# Patient Record
Sex: Female | Born: 1965 | Race: White | Hispanic: No | Marital: Single | Smoking: Former smoker
Health system: Southern US, Community
[De-identification: ages and names within clinical notes are randomized; demographics above are authoritative.]

## PROBLEM LIST (undated history)

## (undated) DIAGNOSIS — N200 Calculus of kidney: Secondary | ICD-10-CM

## (undated) DIAGNOSIS — K5790 Diverticulosis of intestine, part unspecified, without perforation or abscess without bleeding: Secondary | ICD-10-CM

## (undated) DIAGNOSIS — M51369 Other intervertebral disc degeneration, lumbar region without mention of lumbar back pain or lower extremity pain: Secondary | ICD-10-CM

## (undated) DIAGNOSIS — K828 Other specified diseases of gallbladder: Secondary | ICD-10-CM

## (undated) DIAGNOSIS — R51 Headache: Secondary | ICD-10-CM

## (undated) DIAGNOSIS — M5126 Other intervertebral disc displacement, lumbar region: Secondary | ICD-10-CM

## (undated) DIAGNOSIS — N390 Urinary tract infection, site not specified: Secondary | ICD-10-CM

## (undated) DIAGNOSIS — R519 Headache, unspecified: Secondary | ICD-10-CM

## (undated) DIAGNOSIS — K219 Gastro-esophageal reflux disease without esophagitis: Secondary | ICD-10-CM

## (undated) DIAGNOSIS — J189 Pneumonia, unspecified organism: Secondary | ICD-10-CM

## (undated) DIAGNOSIS — Q249 Congenital malformation of heart, unspecified: Secondary | ICD-10-CM

## (undated) DIAGNOSIS — K900 Celiac disease: Secondary | ICD-10-CM

## (undated) DIAGNOSIS — I209 Angina pectoris, unspecified: Secondary | ICD-10-CM

## (undated) DIAGNOSIS — F32A Depression, unspecified: Secondary | ICD-10-CM

## (undated) DIAGNOSIS — F419 Anxiety disorder, unspecified: Secondary | ICD-10-CM

## (undated) DIAGNOSIS — G43909 Migraine, unspecified, not intractable, without status migrainosus: Secondary | ICD-10-CM

## (undated) DIAGNOSIS — E669 Obesity, unspecified: Secondary | ICD-10-CM

## (undated) DIAGNOSIS — I519 Heart disease, unspecified: Secondary | ICD-10-CM

## (undated) DIAGNOSIS — I493 Ventricular premature depolarization: Secondary | ICD-10-CM

## (undated) DIAGNOSIS — E119 Type 2 diabetes mellitus without complications: Secondary | ICD-10-CM

## (undated) DIAGNOSIS — K5730 Diverticulosis of large intestine without perforation or abscess without bleeding: Secondary | ICD-10-CM

## (undated) DIAGNOSIS — I10 Essential (primary) hypertension: Secondary | ICD-10-CM

## (undated) DIAGNOSIS — E039 Hypothyroidism, unspecified: Secondary | ICD-10-CM

## (undated) DIAGNOSIS — E079 Disorder of thyroid, unspecified: Secondary | ICD-10-CM

## (undated) DIAGNOSIS — M5136 Other intervertebral disc degeneration, lumbar region: Secondary | ICD-10-CM

## (undated) DIAGNOSIS — F329 Major depressive disorder, single episode, unspecified: Secondary | ICD-10-CM

## (undated) DIAGNOSIS — K760 Fatty (change of) liver, not elsewhere classified: Secondary | ICD-10-CM

## (undated) DIAGNOSIS — Z87442 Personal history of urinary calculi: Secondary | ICD-10-CM

## (undated) HISTORY — DX: Gastro-esophageal reflux disease without esophagitis: K21.9

## (undated) HISTORY — PX: KIDNEY STONE SURGERY: SHX686

## (undated) HISTORY — DX: Anxiety disorder, unspecified: F41.9

## (undated) HISTORY — DX: Urinary tract infection, site not specified: N39.0

## (undated) HISTORY — PX: TUBAL LIGATION: SHX77

## (undated) HISTORY — DX: Migraine, unspecified, not intractable, without status migrainosus: G43.909

## (undated) HISTORY — DX: Headache, unspecified: R51.9

## (undated) HISTORY — PX: NASAL SINUS SURGERY: SHX719

## (undated) HISTORY — PX: LITHOTRIPSY: SUR834

## (undated) HISTORY — DX: Fatty (change of) liver, not elsewhere classified: K76.0

## (undated) HISTORY — DX: Congenital malformation of heart, unspecified: Q24.9

## (undated) HISTORY — DX: Major depressive disorder, single episode, unspecified: F32.9

## (undated) HISTORY — DX: Obesity, unspecified: E66.9

## (undated) HISTORY — DX: Disorder of thyroid, unspecified: E07.9

## (undated) HISTORY — DX: Diverticulosis of intestine, part unspecified, without perforation or abscess without bleeding: K57.90

## (undated) HISTORY — DX: Pneumonia, unspecified organism: J18.9

## (undated) HISTORY — DX: Headache: R51

## (undated) HISTORY — DX: Depression, unspecified: F32.A

---

## 1984-03-09 HISTORY — PX: ABDOMINAL EXPLORATION SURGERY: SHX538

## 1998-07-09 ENCOUNTER — Emergency Department (HOSPITAL_COMMUNITY): Admission: EM | Admit: 1998-07-09 | Discharge: 1998-07-09 | Payer: Self-pay | Admitting: Emergency Medicine

## 1998-07-09 ENCOUNTER — Encounter: Payer: Self-pay | Admitting: Emergency Medicine

## 2000-12-20 ENCOUNTER — Encounter: Payer: Self-pay | Admitting: Emergency Medicine

## 2000-12-20 ENCOUNTER — Emergency Department (HOSPITAL_COMMUNITY): Admission: EM | Admit: 2000-12-20 | Discharge: 2000-12-20 | Payer: Self-pay | Admitting: Emergency Medicine

## 2000-12-20 ENCOUNTER — Encounter: Payer: Self-pay | Admitting: *Deleted

## 2004-07-25 ENCOUNTER — Ambulatory Visit: Payer: Self-pay | Admitting: Urology

## 2004-12-28 ENCOUNTER — Emergency Department: Payer: Self-pay | Admitting: Emergency Medicine

## 2005-02-09 ENCOUNTER — Ambulatory Visit: Payer: Self-pay

## 2005-02-13 ENCOUNTER — Ambulatory Visit: Payer: Self-pay

## 2005-04-11 ENCOUNTER — Other Ambulatory Visit: Payer: Self-pay

## 2005-04-11 ENCOUNTER — Emergency Department: Payer: Self-pay | Admitting: Emergency Medicine

## 2007-02-05 ENCOUNTER — Emergency Department (HOSPITAL_COMMUNITY): Admission: EM | Admit: 2007-02-05 | Discharge: 2007-02-06 | Payer: Self-pay | Admitting: Emergency Medicine

## 2007-05-25 ENCOUNTER — Emergency Department (HOSPITAL_COMMUNITY): Admission: EM | Admit: 2007-05-25 | Discharge: 2007-05-26 | Payer: Self-pay | Admitting: Emergency Medicine

## 2010-04-09 ENCOUNTER — Emergency Department (HOSPITAL_COMMUNITY)
Admission: EM | Admit: 2010-04-09 | Discharge: 2010-04-09 | Disposition: A | Discharge status: Home / Self Care | Payer: Self-pay | Attending: Emergency Medicine | Admitting: Emergency Medicine

## 2010-04-09 DIAGNOSIS — E669 Obesity, unspecified: Secondary | ICD-10-CM | POA: Insufficient documentation

## 2010-04-09 DIAGNOSIS — F3289 Other specified depressive episodes: Secondary | ICD-10-CM | POA: Insufficient documentation

## 2010-04-09 DIAGNOSIS — F329 Major depressive disorder, single episode, unspecified: Secondary | ICD-10-CM | POA: Insufficient documentation

## 2010-04-09 DIAGNOSIS — K219 Gastro-esophageal reflux disease without esophagitis: Secondary | ICD-10-CM | POA: Insufficient documentation

## 2010-04-09 DIAGNOSIS — R232 Flushing: Secondary | ICD-10-CM | POA: Insufficient documentation

## 2010-04-09 DIAGNOSIS — F411 Generalized anxiety disorder: Secondary | ICD-10-CM | POA: Insufficient documentation

## 2010-07-24 ENCOUNTER — Emergency Department (HOSPITAL_COMMUNITY)
Admission: EM | Admit: 2010-07-24 | Discharge: 2010-07-25 | Disposition: A | Discharge status: Other Acute Inpatient Hospital | Payer: Self-pay | Source: Home / Self Care | Attending: Emergency Medicine | Admitting: Emergency Medicine

## 2010-07-24 ENCOUNTER — Emergency Department (HOSPITAL_COMMUNITY): Payer: Self-pay

## 2010-07-24 DIAGNOSIS — R9431 Abnormal electrocardiogram [ECG] [EKG]: Secondary | ICD-10-CM | POA: Insufficient documentation

## 2010-07-24 DIAGNOSIS — R5383 Other fatigue: Secondary | ICD-10-CM | POA: Insufficient documentation

## 2010-07-24 DIAGNOSIS — R002 Palpitations: Secondary | ICD-10-CM | POA: Insufficient documentation

## 2010-07-24 DIAGNOSIS — F411 Generalized anxiety disorder: Secondary | ICD-10-CM | POA: Insufficient documentation

## 2010-07-24 DIAGNOSIS — R079 Chest pain, unspecified: Secondary | ICD-10-CM | POA: Insufficient documentation

## 2010-07-24 DIAGNOSIS — R5381 Other malaise: Secondary | ICD-10-CM | POA: Insufficient documentation

## 2010-07-24 LAB — BASIC METABOLIC PANEL
BUN: 12 mg/dL (ref 6–23)
Calcium: 8.5 mg/dL (ref 8.4–10.5)
GFR calc Af Amer: >60 mL/min (ref >60)
Glucose, Bld: 119 mg/dL — ABNORMAL HIGH (ref 70–99)
Potassium: 3.9 mEq/L (ref 3.5–5.1)
Sodium: 136 mEq/L (ref 135–145)

## 2010-07-24 LAB — CBC
Hemoglobin: 12.2 g/dL (ref 12.0–15.0)
MCH: 27.3 pg (ref 26.0–34.0)
MCHC: 33.2 g/dL (ref 30.0–36.0)
MCV: 82.1 fL (ref 78.0–100.0)
RBC: 4.47 MIL/uL (ref 3.87–5.11)
RDW: 13.9 % (ref 11.5–15.5)

## 2010-07-24 LAB — PROTIME-INR: Prothrombin Time: 13.1 seconds (ref 11.6–15.2)

## 2010-07-24 LAB — POCT CARDIAC MARKERS
CKMB, poc: <1 ng/mL — ABNORMAL LOW (ref 1.0–8.0)
CKMB, poc: <1 ng/mL — ABNORMAL LOW (ref 1.0–8.0)
Troponin i, poc: <0.05 ng/mL (ref 0.00–0.09)

## 2010-07-24 LAB — APTT: aPTT: 30 seconds (ref 24–37)

## 2010-07-25 ENCOUNTER — Observation Stay (HOSPITAL_COMMUNITY)
Admission: AD | Admit: 2010-07-25 | Discharge: 2010-07-26 | Disposition: A | Discharge status: Home / Self Care | Payer: Self-pay | Source: Other Acute Inpatient Hospital | Attending: Internal Medicine | Admitting: Internal Medicine

## 2010-07-25 DIAGNOSIS — F3289 Other specified depressive episodes: Secondary | ICD-10-CM | POA: Insufficient documentation

## 2010-07-25 DIAGNOSIS — K219 Gastro-esophageal reflux disease without esophagitis: Secondary | ICD-10-CM | POA: Insufficient documentation

## 2010-07-25 DIAGNOSIS — Z01812 Encounter for preprocedural laboratory examination: Secondary | ICD-10-CM | POA: Insufficient documentation

## 2010-07-25 DIAGNOSIS — Z01810 Encounter for preprocedural cardiovascular examination: Secondary | ICD-10-CM | POA: Insufficient documentation

## 2010-07-25 DIAGNOSIS — R079 Chest pain, unspecified: Principal | ICD-10-CM | POA: Insufficient documentation

## 2010-07-25 DIAGNOSIS — Z01818 Encounter for other preprocedural examination: Secondary | ICD-10-CM | POA: Insufficient documentation

## 2010-07-25 DIAGNOSIS — F329 Major depressive disorder, single episode, unspecified: Secondary | ICD-10-CM | POA: Insufficient documentation

## 2010-07-25 DIAGNOSIS — F411 Generalized anxiety disorder: Secondary | ICD-10-CM | POA: Insufficient documentation

## 2010-07-25 LAB — LIPID PANEL
LDL Cholesterol: 114 mg/dL — ABNORMAL HIGH (ref 0–99)
Triglycerides: 148 mg/dL (ref <150)
VLDL: 30 mg/dL (ref 0–40)

## 2010-07-25 LAB — CBC
HCT: 33.3 % — ABNORMAL LOW (ref 36.0–46.0)
Hemoglobin: 11 g/dL — ABNORMAL LOW (ref 12.0–15.0)
MCHC: 33 g/dL (ref 30.0–36.0)
MCV: 82.4 fL (ref 78.0–100.0)
RDW: 14 % (ref 11.5–15.5)
WBC: 7.8 10*3/uL (ref 4.0–10.5)

## 2010-07-25 LAB — CARDIAC PANEL(CRET KIN+CKTOT+MB+TROPI)
CK, MB: 1.4 ng/mL (ref 0.3–4.0)
Total CK: 68 U/L (ref 7–177)
Troponin I: <0.3 ng/mL (ref <0.30)

## 2010-07-25 LAB — HEMOGLOBIN A1C
Hgb A1c MFr Bld: 5.5 % (ref <5.7)
Mean Plasma Glucose: 111 mg/dL (ref <117)

## 2010-07-25 LAB — MAGNESIUM: Magnesium: 2 mg/dL (ref 1.5–2.5)

## 2010-07-25 LAB — D-DIMER, QUANTITATIVE: D-Dimer, Quant: <0.22 ug/mL-FEU (ref 0.00–0.48)

## 2010-07-25 LAB — TSH: TSH: 5.411 u[IU]/mL — ABNORMAL HIGH (ref 0.350–4.500)

## 2010-07-25 LAB — HEPARIN LEVEL (UNFRACTIONATED): Heparin Unfractionated: 0.7 IU/mL (ref 0.30–0.70)

## 2010-07-26 LAB — CBC
RBC: 4.22 MIL/uL (ref 3.87–5.11)
WBC: 7.2 10*3/uL (ref 4.0–10.5)

## 2010-07-26 LAB — HEPARIN LEVEL (UNFRACTIONATED): Heparin Unfractionated: <0.1 IU/mL — ABNORMAL LOW (ref 0.30–0.70)

## 2010-07-26 LAB — BASIC METABOLIC PANEL
CO2: 27 mEq/L (ref 19–32)
Chloride: 104 mEq/L (ref 96–112)
GFR calc non Af Amer: >60 mL/min (ref >60)

## 2010-07-30 NOTE — Discharge Summary (Signed)
Abigail [name], Abigail [name]             ACCOUNT NO.:  [id number]  MEDICAL RECORD NO.:  [id number]           PATIENT TYPE:  O  LOCATION:  0086                         FACILITY:  [facility]  PHYSICIAN:  Mali Biff Rutigliano, MD         DATE OF BIRTH:  January 28, 1966  DATE OF ADMISSION:  07/25/2010 DATE OF DISCHARGE:  07/26/2010                              DISCHARGE SUMMARY   DISCHARGE DIAGNOSES: 1. Chest pain.     a.     Negative myocardial infarction.     b.     Negative D-dimer.     c.     Patent coronary arteries.     d.     Secondary to negative myocardial infarction. 2. Gastroesophageal reflux disease. 3. Anxiety and depression. 4. Tachycardia.  DISCHARGE CONDITION:  Improved.  PROCEDURE:  Combined left heart cath by Dr. Glenetta Hew on Jul 25, 2010.  DISCHARGE MEDICATIONS:  See medication reconciliation sheet from [facility]. We added Toprol XL 12.5 mg daily, Lopressor 25 b.i.d., her blood pressure dropped into the 90, so we had to lower the dose. We also increased her Prilosec to twice a day.  DISCHARGE INSTRUCTIONS: 1. Increase activity slowly.  No work until Jul 30, 2010.  No lifting     for 4 days.  No driving for 2 days. 2. Wash cath site with soap and water.  Call if any bleeding, swelling     or drainage. 3. Low-sodium heart-healthy diet. 4. Follow up with Dr. Allison Quarry office, will call with date and time. 5. You will be scheduled to wear a heart monitor, the office will call     concerning this.  HOSPITAL COURSE:  A 45 year old female presented to emergency room at [name] with chest discomfort on Jul 24, 2010.  She was getting drowsy, felt anxious and nervous, felt like her pulse was irregular, and she had sudden onset left shoulder pain radiating down to her elbow across the chest into her neck, who contacted her primary care physician who instructed to go the emergency room.  She was having PVCs.  Initial thought was for discharge but then she continued with the  discomfort since she was admitted.  She had some EKG changes with T-wave inversions in V4 through V6.  She was admitted on IV nitro, IV heparin, and even with this and some morphine she would have some mild chest and shoulder pain.  Later that morning she was seen for cardiac cath, was found to have patent coronary arteries.  EF was 70%, hyperdynamic LV function. She did well overnight.  She continued with some PVCs.  By the next morning, hemoglobin 11.5, hematocrit 35.4, platelets 250.  WBC 7.2, sodium 138, potassium 4.3, BUN 8, creatinine 0.59, glucose 93.  PHYSICAL EXAMINATION:  VITAL SIGNS:  Blood pressure was ranging 90-97, pulse 67, respiratory rate 18, temperature 98.2, SpO2 98% on room air. HEART:  Regular rate and rhythm. LUNGS:  Clear. ABDOMEN:  Soft.  Right radial cath site was stable.  Dr. Gwenlyn Found, saw her and felt she was stable for discharge.  We will get an outpatient monitor to rule out  PSVT because of some of her symptoms. She will follow up with Dr. Ellyn Hack as.  Please note her glycohemoglobin was 5.5.  Her D-dimer was less than 0.22.  TSH was 5.4, magnesium was 2.0, and cardiac markers were all negative.  Initial TSH was 3.457, unsure why the discrepancy.  RADIOLOGY:  Chest x-ray, negative 2-view chest.     Abigail [name]. Abigail [name], N.P.   ______________________________ Mali Tauno Falotico, MD    LRI/MEDQ  D:  07/26/2010  T:  07/26/2010  Job:  [id number]  cc:   Rolland Porter, MD  Electronically Signed by Cecilie Kicks N.P. on 07/29/2010 06:41:04 PM Electronically Signed by Raliegh Ip. Ahad Colarusso M.D. on 07/30/2010 08:12:39 AM

## 2010-08-02 NOTE — Cardiovascular Report (Signed)
Abigail [name], Abigail [name]             ACCOUNT NO.:  [id number]  MEDICAL RECORD NO.:  [id number]           PATIENT TYPE:  O  LOCATION:  0272                         FACILITY:  [place]  PHYSICIAN:  Rolland Porter, MD DATE OF BIRTH:  19-Sep-1965  DATE OF PROCEDURE:  07/25/2010 DATE OF DISCHARGE:  07/26/2010                           CARDIAC CATHETERIZATION   PRIMARY PHYSICIAN:  Dr. Eual Fines From [facility] in [place], [place].  PROCEDURES PERFORMED: 1. Left heart catheterization via 5-French right radial access. 2. Left ventriculogram, 10 mL contrast for 30 mL. 3. Native coronary angiography.  INDICATIONS:  Persistent chest pain despite nitroglycerin concerning for unstable angina.  BRIEF HISTORY:  [name] is a very pleasant is 45 year old woman who was admitted overnight on the 17th into the 18th with progressive chest pain intermittently with nitroglycerin.  Based on the requiring dose of nitroglycerin, decision was made and wished to proceed with diagnostic cardiac angiography to investigate coronary anatomy for possible unstable angina.  PROCEDURE:  The risks, benefits, alternatives, and indication of the procedure were explained to the patient in detail.  She voiced understanding with signed form placed on the chart.  The patient was brought to the second floor at [facility] in the fasting state.  She was prepped and draped in usual sterile fashion with the right radial artery access site isolated. Prior to draping a modified Allen's/Barbeau test was performed.  It demonstrated adequate collateral circulation through the right ulnar artery.  A time-out period was performed and the patient was sedated with intravenous Versed and fentanyl.  The right wrist was then anesthetized using 1% subcutaneous lidocaine and the right radial artery was then accessed using the Seldinger technique with placement of 5- French sheath.   After sheath was placed.  Intravenous heparin bolus of 3500 units was administered as well as 10 mL of standard radial cocktail being as described below was infused through the radial sheath.  Then, a 5-French TIG-4 catheter was advanced over J-wire into the ascending aorta.  It was used to engage the left coronary artery and multiple angiographies of left coronary system obtained.  Catheter was exchanged for JR-4 catheter as it was unable to engage the right coronary artery but due to an anterior downward takeoff.  It was unsuccessfully the JR-4 catheter therefore was exchanged for 5-French RCB catheter, which demonstrated multiple angiographies of the right coronary system were obtained.  This was exchanged for a 5-French angled pigtail catheter was advanced across the aortic valve measuring left ventricular hemodynamics.  The radial sheath was then removed and a TR band placed at 13 mL of air 3:40 in the afternoon.  Left ventriculogram was then performed.  The hemodynamics was then remeasured in the catheters pullback across the aortic valve measuring pullback gradient.  The patient is stable for during and after the procedure.  There were no complications.  Estimated blood loss less than 10 mL.  CATHETERIZATION STATISTICS: 1. Sedation, 1 mg of Versed and 25 mcg of fentanyl.  Also 5 mg p.o.     Valium was given for premedication along with 25 mg of  Benadryl. 2. Radial cocktail consists of dilated 10 mL saline, 400 mcg of     nitroglycerin, 5 mg of verapamil, and 2 mL of 1% lidocaine. 3. Contrast was less than 70 mL.  ANGIOGRAPHIC HEMODYNAMICS: 1. Left ventricular pressure 119/30 mmHg with EDP of 19 mmHg. 2. Aortic pressures 113/70 mmHg with EDP or the mean of 87 mmHg. 3. Left ventriculogram showed a hyperdynamic left ventricle with EF of     70%.  ANGIOGRAPHIC FINDINGS:  Overall, there is minimal angiographic evidence of coronary artery disease with extremely tortuous  vessels.  1. The left main was very, very short, essentially cloacal left main,     which gives rise to the LAD and circumflex.  There is no     significant disease 2. The LAD is a large-caliber to tortuous vessel, which gives rise to     a large diagonal branches into small branches.  There is no     significant disease noted. 3. The circumflex is also large caliber tortuous vessel gives rise to     some distal large obtuse marginals as well as one mid obtuse     marginal branch.  There is no significant disease. 4. Right coronary artery is dominant, gives rise to a posterolateral     and posterior descending arteries with no significant  disease.  IMPRESSION: 1. Large, tortuous coronary arteries with no angiographic evidence,     significant coronary disease. 2. Most likely diagnosis of chest pain as nonanginal in chest pain.     All the coronary spasm maybe potential.  PLAN:  Standard postradial care, he will continue to wean off of nitroglycerin and consider adding a calcium channel blocker either on discharge or upon followup.  The patient will follow up with me in [facility] for additional evaluation and care.          ______________________________ Rolland Porter, MD     DWH/MEDQ  D:  07/28/2010  T:  07/29/2010  Job:  [id number]  cc:   Cyndi Bender, PA [facility]  Electronically Signed by Glenetta Hew MD on 08/02/2010 12:05:10 PM

## 2010-12-01 LAB — DIFFERENTIAL
Lymphocytes Relative: 18
Lymphs Abs: 1.9
Neutro Abs: 7.6
Neutrophils Relative %: 73

## 2010-12-01 LAB — I-STAT 8, (EC8 V) (CONVERTED LAB)
Bicarbonate: 23.1
Glucose, Bld: 109 — ABNORMAL HIGH
TCO2: 24
pH, Ven: 7.458 — ABNORMAL HIGH

## 2010-12-01 LAB — POCT I-STAT CREATININE: Operator id: 270651

## 2010-12-01 LAB — CBC
HCT: 39.9
Hemoglobin: 13.7
MCHC: 34.4
MCV: 87.1
Platelets: 293
RBC: 4.59
RDW: 13.6
WBC: 10.3

## 2010-12-01 LAB — POCT CARDIAC MARKERS
CKMB, poc: <1 — ABNORMAL LOW
Myoglobin, poc: 37.4
Operator id: 270651
Troponin i, poc: <0.05
Troponin i, poc: <0.05

## 2010-12-01 LAB — D-DIMER, QUANTITATIVE: D-Dimer, Quant: 0.36

## 2012-04-30 ENCOUNTER — Emergency Department: Payer: Self-pay | Admitting: Emergency Medicine

## 2012-12-16 ENCOUNTER — Emergency Department (HOSPITAL_COMMUNITY)
Admission: EM | Admit: 2012-12-16 | Discharge: 2012-12-16 | Disposition: A | Discharge status: Home / Self Care | Payer: 59 | Attending: Emergency Medicine | Admitting: Emergency Medicine

## 2012-12-16 ENCOUNTER — Telehealth: Payer: Self-pay | Admitting: Cardiology

## 2012-12-16 ENCOUNTER — Emergency Department (HOSPITAL_COMMUNITY): Payer: 59

## 2012-12-16 ENCOUNTER — Encounter (HOSPITAL_COMMUNITY): Payer: Self-pay | Admitting: Emergency Medicine

## 2012-12-16 DIAGNOSIS — R072 Precordial pain: Secondary | ICD-10-CM | POA: Insufficient documentation

## 2012-12-16 DIAGNOSIS — R079 Chest pain, unspecified: Secondary | ICD-10-CM

## 2012-12-16 DIAGNOSIS — R0602 Shortness of breath: Secondary | ICD-10-CM | POA: Insufficient documentation

## 2012-12-16 DIAGNOSIS — Z79899 Other long term (current) drug therapy: Secondary | ICD-10-CM | POA: Insufficient documentation

## 2012-12-16 DIAGNOSIS — I10 Essential (primary) hypertension: Secondary | ICD-10-CM | POA: Insufficient documentation

## 2012-12-16 HISTORY — DX: Heart disease, unspecified: I51.9

## 2012-12-16 LAB — POCT I-STAT TROPONIN I
Troponin i, poc: 0 ng/mL (ref 0.00–0.08)
Troponin i, poc: 0 ng/mL (ref 0.00–0.08)

## 2012-12-16 LAB — BASIC METABOLIC PANEL
CO2: 28 mEq/L (ref 19–32)
GFR calc non Af Amer: >90 mL/min (ref >90)
Glucose, Bld: 112 mg/dL — ABNORMAL HIGH (ref 70–99)
Potassium: 3.5 mEq/L (ref 3.5–5.1)
Sodium: 141 mEq/L (ref 135–145)

## 2012-12-16 LAB — CBC
Hemoglobin: 12.3 g/dL (ref 12.0–15.0)
MCHC: 32.1 g/dL (ref 30.0–36.0)
Platelets: 293 10*3/uL (ref 150–400)
RBC: 4.75 MIL/uL (ref 3.87–5.11)

## 2012-12-16 LAB — D-DIMER, QUANTITATIVE: D-Dimer, Quant: <0.27 ug/mL-FEU (ref 0.00–0.48)

## 2012-12-16 MED ORDER — ASPIRIN 325 MG PO TABS: 325.0000 mg | ORAL_TABLET | Freq: Once | ORAL | Status: DC

## 2012-12-16 MED ORDER — ALPRAZOLAM 0.25 MG PO TABS
0.2500 mg | ORAL_TABLET | Freq: Once | ORAL | Status: AC
  Administered 2012-12-16: 0.25 mg via ORAL
  Filled 2012-12-16: qty 1

## 2012-12-16 MED ORDER — ASPIRIN 81 MG PO CHEW
162.0000 mg | CHEWABLE_TABLET | Freq: Once | ORAL | Status: AC
  Administered 2012-12-16: 162 mg via ORAL
  Filled 2012-12-16: qty 2

## 2012-12-16 MED ORDER — TRAMADOL HCL 50 MG PO TABS
50.0000 mg | ORAL_TABLET | Freq: Once | ORAL | Status: AC
  Administered 2012-12-16: 50 mg via ORAL
  Filled 2012-12-16: qty 1

## 2012-12-16 NOTE — ED Provider Notes (Signed)
CSN: [id number]     Arrival date & time 12/16/12  1446 History   First MD Initiated Contact with Patient 12/16/12 1507     Chief Complaint  Patient presents with  . Chest Pain   (Consider location/radiation/quality/duration/timing/severity/associated sxs/prior Treatment) The history is provided by the patient.  Abigail [name] is a 47 y.o. female hx of HTN here with chest pain. L arm pain for the last week. However, this AM she woke up then had substernal chest pain radiated to L arm and jaw area. She then felt short of breath and felt that "she was choking". She called her cardiologist and was sent for evaluation. She had similar episode in 2012 and she had cardiac cath that was normal. No history of DVT/PE.    Past Medical History  Diagnosis Date  . Heart disease    History reviewed. No pertinent past surgical history. History reviewed. No pertinent family history. History  Substance Use Topics  . Smoking status: Never Smoker   . Smokeless tobacco: Not on file  . Alcohol Use: No   OB History   Grav Para Term Preterm Abortions TAB SAB Ect Mult Living                 Review of Systems  Respiratory: Positive for shortness of breath.   Cardiovascular: Positive for chest pain.  All other systems reviewed and are negative.    Allergies  Review of patient's allergies indicates no known allergies.  Home Medications   Current Outpatient Rx  Name  Route  Sig  Dispense  Refill  . aspirin 81 MG chewable tablet   Oral   Chew 81 mg by mouth daily as needed for pain.         . Aspirin-Caffeine (BAYER BACK & BODY PAIN EX ST PO)   Oral   Take 1 tablet by mouth every 6 (six) hours as needed (pain).         [name] esomeprazole (NEXIUM) 40 MG capsule   Oral   Take 40 mg by mouth daily before breakfast.         . ibuprofen (ADVIL,MOTRIN) 200 MG tablet   Oral   Take 400 mg by mouth every 6 (six) hours as needed for pain or headache.         . IRON PO   Oral   Take 1  tablet by mouth every other day.         . metoprolol tartrate (LOPRESSOR) 25 MG tablet   Oral   Take 12.5 mg by mouth daily.          BP 129/76  Pulse 103  Temp(Src) 99.2 F (37.3 C) (Oral)  Resp 13  SpO2 97%  LMP 12/09/2012 Physical Exam  Nursing note and vitals reviewed. Constitutional: She is oriented to person, place, and time. She appears well-developed and well-nourished.  HENT:  Head: Normocephalic.  Mouth/Throat: Oropharynx is clear and moist.  Eyes: Conjunctivae are normal. Pupils are equal, round, and reactive to light.  Neck: Normal range of motion. Neck supple.  Cardiovascular: Regular rhythm and normal heart sounds.   Slightly tachy   Pulmonary/Chest: Effort normal and breath sounds normal. No respiratory distress. She has no wheezes. She has no rales.  Abdominal: Soft. Bowel sounds are normal. She exhibits no distension. There is no tenderness. There is no rebound and no guarding.  Musculoskeletal: Normal range of motion. She exhibits no edema and no tenderness.  Neurological: She is alert and oriented  to person, place, and time.  Skin: Skin is warm and dry.  Psychiatric: She has a normal mood and affect. Her behavior is normal. Judgment and thought content normal.    ED Course  Procedures (including critical care time) Labs Review Labs Reviewed  CBC - Abnormal; Notable for the following:    MCH 25.9 (*)    All other components within normal limits  BASIC METABOLIC PANEL - Abnormal; Notable for the following:    Glucose, Bld 112 (*)    All other components within normal limits  D-DIMER, QUANTITATIVE  POCT I-STAT TROPONIN I  POCT I-STAT TROPONIN I   Imaging Review Dg Chest 2 View  12/16/2012   CLINICAL DATA:  Chest pain  EXAM: CHEST  2 VIEW  COMPARISON:  07/24/2010  FINDINGS: Normal heart size, mediastinal contours, and pulmonary vascularity.  Lungs clear.  Bones unremarkable.  No pneumothorax.  IMPRESSION: Normal exam.   Electronically Signed   By:  Lavonia Dana M.D.   On: 12/16/2012 16:06    EKG Interpretation   None       Date: 12/16/2012  Rate: 103  Rhythm: sinus tachycardia  QRS Axis: normal  Intervals: normal  ST/T Wave abnormalities: nonspecific ST changes  Conduction Disutrbances:none  Narrative Interpretation:   Old EKG Reviewed: none available    MDM  No diagnosis found. DEANDRE STANSEL is a 47 y.o. female here with SOB, chest pain. Atypical for ACS but given onset today will get trop x 2. She is tachy I think likely from anxiety. But will need to consider possible PE so will get d-dimer.   7:32 PM D-dimer normal. Trop neg x 2. Stable for d/c.    Wandra Arthurs, MD 12/16/12 [phone]

## 2012-12-16 NOTE — ED Notes (Signed)
Pt took 2x63m chewable ASA tyhis AM

## 2012-12-16 NOTE — Telephone Encounter (Signed)
Returning your call. °

## 2012-12-16 NOTE — ED Notes (Signed)
Pt reports L arm pain x 1 week. Then this am she began to have CP/tightness across chest into her back and L shoulder with nausea and "felt like i was choking." states pain has been constant. A&ox4, resp e/u

## 2012-12-16 NOTE — Telephone Encounter (Signed)
Patient states that she is having chestpain and it is radiating between her shoulder blades.  I told her to go to the ER, but she still wants a nurse to call her.

## 2012-12-16 NOTE — Telephone Encounter (Signed)
Calling c/o chest pain that radiates through to back off and on since yesterday.  States she has also been increasingly fatigued. Right now feels like her heart is pounding in her chest.  Insisted she either have her friend take her to the ER or call 911.  She did not go earlier because she was hoping we would tell her she just needed to rest. Assured me she will go directly to [facility] ER.

## 2013-05-20 ENCOUNTER — Emergency Department (HOSPITAL_COMMUNITY): Payer: 59

## 2013-05-20 ENCOUNTER — Encounter (HOSPITAL_COMMUNITY): Payer: Self-pay | Admitting: Emergency Medicine

## 2013-05-20 DIAGNOSIS — R42 Dizziness and giddiness: Secondary | ICD-10-CM | POA: Insufficient documentation

## 2013-05-20 DIAGNOSIS — I498 Other specified cardiac arrhythmias: Secondary | ICD-10-CM | POA: Insufficient documentation

## 2013-05-20 DIAGNOSIS — R55 Syncope and collapse: Secondary | ICD-10-CM | POA: Insufficient documentation

## 2013-05-20 DIAGNOSIS — Z8679 Personal history of other diseases of the circulatory system: Secondary | ICD-10-CM | POA: Insufficient documentation

## 2013-05-20 DIAGNOSIS — Z79899 Other long term (current) drug therapy: Secondary | ICD-10-CM | POA: Insufficient documentation

## 2013-05-20 LAB — CBC WITH DIFFERENTIAL/PLATELET
BASOS PCT: 0 % (ref 0–1)
Basophils Absolute: 0 10*3/uL (ref 0.0–0.1)
Eosinophils Absolute: 0.4 10*3/uL (ref 0.0–0.7)
Eosinophils Relative: 4 % (ref 0–5)
HEMATOCRIT: 40 % (ref 36.0–46.0)
Hemoglobin: 13.7 g/dL (ref 12.0–15.0)
LYMPHS ABS: 2.6 10*3/uL (ref 0.7–4.0)
Lymphocytes Relative: 29 % (ref 12–46)
MCH: 28.4 pg (ref 26.0–34.0)
MCHC: 34.3 g/dL (ref 30.0–36.0)
MCV: 83 fL (ref 78.0–100.0)
MONO ABS: 0.5 10*3/uL (ref 0.1–1.0)
MONOS PCT: 6 % (ref 3–12)
NEUTROS ABS: 5.6 10*3/uL (ref 1.7–7.7)
Neutrophils Relative %: 61 % (ref 43–77)
Platelets: 318 10*3/uL (ref 150–400)
RBC: 4.82 MIL/uL (ref 3.87–5.11)
RDW: 13.9 % (ref 11.5–15.5)
WBC: 9.1 10*3/uL (ref 4.0–10.5)

## 2013-05-20 LAB — CBG MONITORING, ED: Glucose-Capillary: 147 mg/dL — ABNORMAL HIGH (ref 70–99)

## 2013-05-20 LAB — I-STAT TROPONIN, ED: TROPONIN I, POC: 0 ng/mL (ref 0.00–0.08)

## 2013-05-20 NOTE — ED Notes (Addendum)
Pt reports that she has been out of BP meds for 2 weeks; reports she feels very fatigued. Took BP at home and was 180/110. Reports she feels like her throat is closing but no airway swelling or difficulty talking or breathing. No slurred speech, no extremity weakness; A&Ox3. Reports she just hasnt felt right all day. EKG performed in triage. Family reports LSN 1500 today. Pt tachy in 110's. Denies chest pain; SOB.

## 2013-05-21 ENCOUNTER — Emergency Department (HOSPITAL_COMMUNITY)
Admission: EM | Admit: 2013-05-21 | Discharge: 2013-05-21 | Disposition: A | Discharge status: Home / Self Care | Payer: 59 | Attending: Emergency Medicine | Admitting: Emergency Medicine

## 2013-05-21 DIAGNOSIS — R55 Syncope and collapse: Secondary | ICD-10-CM

## 2013-05-21 DIAGNOSIS — R000 Tachycardia, unspecified: Secondary | ICD-10-CM

## 2013-05-21 LAB — COMPREHENSIVE METABOLIC PANEL
ALK PHOS: 106 U/L (ref 39–117)
ALT: 31 U/L (ref 0–35)
AST: 23 U/L (ref 0–37)
Albumin: 3.6 g/dL (ref 3.5–5.2)
BUN: 11 mg/dL (ref 6–23)
CHLORIDE: 102 meq/L (ref 96–112)
CO2: 23 mEq/L (ref 19–32)
CREATININE: 0.59 mg/dL (ref 0.50–1.10)
Calcium: 9.1 mg/dL (ref 8.4–10.5)
GFR calc Af Amer: >90 mL/min (ref >90)
GLUCOSE: 134 mg/dL — AB (ref 70–99)
Potassium: 4 mEq/L (ref 3.7–5.3)
Sodium: 141 mEq/L (ref 137–147)
TOTAL PROTEIN: 7 g/dL (ref 6.0–8.3)
Total Bilirubin: 0.4 mg/dL (ref 0.3–1.2)

## 2013-05-21 MED ORDER — METOPROLOL SUCCINATE ER 25 MG PO TB24: 12.5000 mg | ORAL_TABLET | Freq: Every day | ORAL | Status: DC

## 2013-05-21 MED ORDER — METOPROLOL TARTRATE 25 MG PO TABS
12.5000 mg | ORAL_TABLET | Freq: Once | ORAL | Status: AC
  Administered 2013-05-21: 12.5 mg via ORAL
  Filled 2013-05-21: qty 1

## 2013-05-21 NOTE — ED Notes (Signed)
The pt has some sl chest tightness still.  bp lower

## 2013-05-21 NOTE — Discharge Instructions (Signed)
All the results in the ER are normal, labs and imaging. We are not sure what is causing your symptoms. We think it could be due to your elevated heart rate, or a vasovagal event. The workup in the ER is not complete, and is limited to screening for life threatening and emergent conditions only, so please see a primary care doctor for further evaluation.   Vasovagal Syncope, Adult Syncope, commonly known as fainting, is a temporary loss of consciousness. It occurs when the blood flow to the brain is reduced. Vasovagal syncope (also called neurocardiogenic syncope) is a fainting spell in which the blood flow to the brain is reduced because of a sudden drop in heart rate and blood pressure. Vasovagal syncope occurs when the brain and the cardiovascular system (blood vessels) do not adequately communicate and respond to each other. This is the most common cause of fainting. It often occurs in response to fear or some other type of emotional or physical stress. The body has a reaction in which the heart starts beating too slowly or the blood vessels expand, reducing blood pressure. This type of fainting spell is generally considered harmless. However, injuries can occur if a person takes a sudden fall during a fainting spell.  CAUSES  Vasovagal syncope occurs when a person's blood pressure and heart rate decrease suddenly, usually in response to a trigger. Many things and situations can trigger an episode. Some of these include:   Pain.   Fear.   The sight of blood or medical procedures, such as blood being drawn from a vein.   Common activities, such as coughing, swallowing, stretching, or going to the bathroom.   Emotional stress.   Prolonged standing, especially in a warm environment.   Lack of sleep or rest.   Prolonged lack of food.   Prolonged lack of fluids.   Recent illness.  The use of certain drugs that affect blood pressure, such as cocaine, alcohol, marijuana,  inhalants, and opiates.  SYMPTOMS  Before the fainting episode, you may:   Feel dizzy or light headed.   Become pale.  Sense that you are going to faint.   Feel like the room is spinning.   Have tunnel vision, only seeing directly in front of you.   Feel sick to your stomach (nauseous).   See spots or slowly lose vision.   Hear ringing in your ears.   Have a headache.   Feel warm and sweaty.   Feel a sensation of pins and needles. During the fainting spell, you will generally be unconscious for no longer than a couple minutes before waking up and returning to normal. If you get up too quickly before your body can recover, you may faint again. Some twitching or jerky movements may occur during the fainting spell.  DIAGNOSIS  Your caregiver will ask about your symptoms, take a medical history, and perform a physical exam. Various tests may be done to rule out other causes of fainting. These may include blood tests and tests to check the heart, such as electrocardiography, echocardiography, and possibly an electrophysiology study. When other causes have been ruled out, a test may be done to check the body's response to changes in position (tilt table test). TREATMENT  Most cases of vasovagal syncope do not require treatment. Your caregiver may recommend ways to avoid fainting triggers and may provide home strategies for preventing fainting. If you must be exposed to a possible trigger, you can drink additional fluids to help reduce your  chances of having an episode of vasovagal syncope. If you have warning signs of an oncoming episode, you can respond by positioning yourself favorably (lying down). If your fainting spells continue, you may be given medicines to prevent fainting. Some medicines may help make you more resistant to repeated episodes of vasovagal syncope. Special exercises or compression stockings may be recommended. In rare cases, the surgical placement of a  pacemaker is considered. HOME CARE INSTRUCTIONS   Learn to identify the warning signs of vasovagal syncope.   Sit or lie down at the first warning sign of a fainting spell. If sitting, put your head down between your legs. If you lie down, swing your legs up in the air to increase blood flow to the brain.   Avoid hot tubs and saunas.  Avoid prolonged standing.  Drink enough fluids to keep your urine clear or pale yellow. Avoid caffeine.  Increase salt in your diet as directed by your caregiver.   If you have to stand for a long time, perform movements such as:   Crossing your legs.   Flexing and stretching your leg muscles.   Squatting.   Moving your legs.   Bending over.   Only take over-the-counter or prescription medicines as directed by your caregiver. Do not suddenly stop any medicines without asking your caregiver first. SEEK MEDICAL CARE IF:   Your fainting spells continue or happen more frequently in spite of treatment.   You lose consciousness for more than a couple minutes.  You have fainting spells during or after exercising or after being startled.   You have new symptoms that occur with the fainting spells, such as:   Shortness of breath.  Chest pain.   Irregular heartbeat.   You have episodes of twitching or jerky movements that last longer than a few seconds.  You have episodes of twitching or jerky movements without obvious fainting. SEEK IMMEDIATE MEDICAL CARE IF:   You have injuries or bleeding after a fainting spell.   You have episodes of twitching or jerky movements that last longer than 5 minutes.   You have more than one spell of twitching or jerky movements before returning to consciousness after fainting. MAKE SURE YOU:   Understand these instructions.  Will watch your condition.  Will get help right away if you are not doing well or get worse. Document Released: 02/10/2012 Document Reviewed: 02/10/2012 [facility]  Patient Information 2014 [place], [place]. Nonspecific Tachycardia Tachycardia is a faster than normal heartbeat (more than 100 beats per minute). In adults, the heart normally beats between 60 and 100 times a minute. A fast heartbeat may be a normal response to exercise or stress. It does not necessarily mean that something is wrong. However, sometimes when your heart beats too fast it may not be able to pump enough blood to the rest of your body. This can result in chest pain, shortness of breath, dizziness, and even fainting. Nonspecific tachycardia means that the specific cause or pattern of your tachycardia is unknown. CAUSES  Tachycardia may be harmless or it may be due to a more serious underlying cause. Possible causes of tachycardia include:  Exercise or exertion.  Fever.  Pain or injury.  Infection.  Loss of body fluids (dehydration).  Overactive thyroid.  Lack of red blood cells (anemia).  Anxiety and stress.  Alcohol.  Caffeine.  Tobacco products.  Diet pills.  Illegal drugs.  Heart disease. SYMPTOMS  Rapid or irregular heartbeat (palpitations).  Suddenly feeling your heart beating (  cardiac awareness).  Dizziness.  Tiredness (fatigue).  Shortness of breath.  Chest pain.  Nausea.  Fainting. DIAGNOSIS  Your caregiver will perform a physical exam and take your medical history. In some cases, a heart specialist (cardiologist) may be consulted. Your caregiver may also order:  Blood tests.  Electrocardiography. This test records the electrical activity of your heart.  A heart monitoring test. TREATMENT  Treatment will depend on the likely cause of your tachycardia. The goal is to treat the underlying cause of your tachycardia. Treatment methods may include:  Replacement of fluids or blood through an intravenous (IV) tube for moderate to severe dehydration or anemia.  New medicines or changes in your current medicines.  Diet and lifestyle  changes.  Treatment for certain infections.  Stress relief or relaxation methods. HOME CARE INSTRUCTIONS   Rest.  Drink enough fluids to keep your urine clear or pale yellow.  Do not smoke.  Avoid:  Caffeine.  Tobacco.  Alcohol.  Chocolate.  Stimulants such as over-the-counter diet pills or pills that help you stay awake.  Situations that cause anxiety or stress.  Illegal drugs such as marijuana, phencyclidine (PCP), and cocaine.  Only take medicine as directed by your caregiver.  Keep all follow-up appointments as directed by your caregiver. SEEK IMMEDIATE MEDICAL CARE IF:   You have pain in your chest, upper arms, jaw, or neck.  You become weak, dizzy, or feel faint.  You have palpitations that will not go away.  You vomit, have diarrhea, or pass blood in your stool.  Your skin is cool, pale, and wet.  You have a fever that will not go away with rest, fluids, and medicine. MAKE SURE YOU:   Understand these instructions.  Will watch your condition.  Will get help right away if you are not doing well or get worse. Document Released: 04/02/2004 Document Revised: 05/18/2011 Document Reviewed: 02/03/2011 [facility] Patient Information 2014 [place], [place].

## 2013-05-21 NOTE — ED Notes (Signed)
The pt had an episode of feeling faint with blurred vision just pta.  Her bp was high she felt chilled and weakness.  She had something to eat and her family brought her here.  She has not been taking her bp for awhile

## 2013-05-21 NOTE — ED Provider Notes (Signed)
CSN: [id number]     Arrival date & time 05/20/13  2254 History   First MD Initiated Contact with Patient 05/21/13 0008     Chief Complaint  Patient presents with  . Weakness     (Consider location/radiation/quality/duration/timing/severity/associated sxs/prior Treatment) HPI Comments: 48 y/o pleasant female comes in with cc of weakness. Hx of tachydysrhythmia and HTN, on metoprolol. Pt reports that this evening, she had an episode all of a sudden, where her entire body felt weak and she felt like she was "bottoming out." She felt clammy, nauseated, and that she might faint. She had some chest tightness. No emesis. Pt had her family members check her BP and her blood sugar, and the former was in the 180s SBP at that time, so she was sent to the ER. She denies any substance abuse, no new meds and she reports that she hasn't filled her metoprolol x 2 weeks.  The history is provided by the patient and medical records.    Past Medical History  Diagnosis Date  . Heart disease    History reviewed. No pertinent past surgical history. History reviewed. No pertinent family history. History  Substance Use Topics  . Smoking status: Never Smoker   . Smokeless tobacco: Not on file  . Alcohol Use: No   OB History   Grav Para Term Preterm Abortions TAB SAB Ect Mult Living                 Review of Systems  Constitutional: Negative for activity change.  Respiratory: Positive for chest tightness and shortness of breath.   Cardiovascular: Negative for chest pain.  Gastrointestinal: Negative for nausea, vomiting and abdominal pain.  Genitourinary: Negative for dysuria.  Musculoskeletal: Negative for neck pain.  Neurological: Positive for dizziness. Negative for headaches.      Allergies  Review of patient's allergies indicates no known allergies.  Home Medications   Current Outpatient Rx  Name  Route  Sig  Dispense  Refill  . esomeprazole (NEXIUM) 40 MG capsule   Oral   Take 40 mg  by mouth daily before breakfast.         . ibuprofen (ADVIL,MOTRIN) 200 MG tablet   Oral   Take 400 mg by mouth every 6 (six) hours as needed for pain or headache.         . IRON PO   Oral   Take 1 tablet by mouth every other day.         . metoprolol tartrate (LOPRESSOR) 25 MG tablet   Oral   Take 12.5 mg by mouth daily.          BP 157/81  Pulse 116  Temp(Src) 98.5 F (36.9 C) (Oral)  Resp 20  SpO2 96%  LMP 05/07/2013 Physical Exam  Nursing note and vitals reviewed. Constitutional: She is oriented to person, place, and time. She appears well-developed and well-nourished.  HENT:  Head: Normocephalic and atraumatic.  Eyes: EOM are normal. Pupils are equal, round, and reactive to light.  Neck: Neck supple.  No bruits  Cardiovascular: Normal rate, regular rhythm and normal heart sounds.   No murmur heard. Pulmonary/Chest: Effort normal. No respiratory distress.  Abdominal: Soft. She exhibits no distension. There is no tenderness. There is no rebound and no guarding.  Neurological: She is alert and oriented to person, place, and time.  Skin: Skin is warm and dry.    ED Course  Procedures (including critical care time) Labs Review Labs Reviewed  COMPREHENSIVE METABOLIC  PANEL - Abnormal; Notable for the following:    Glucose, Bld 134 (*)    All other components within normal limits  CBG MONITORING, ED - Abnormal; Notable for the following:    Glucose-Capillary 147 (*)    All other components within normal limits  CBC WITH DIFFERENTIAL  Randolm Idol, ED   Imaging Review Dg Chest 2 View  05/21/2013   CLINICAL DATA:  Weakness and fatigue.  EXAM: CHEST  2 VIEW  COMPARISON:  12/16/2012.  FINDINGS: Normal sized heart. Clear lungs. Minimal thoracic spine degenerative changes.  IMPRESSION: No acute abnormality.   Electronically Signed   By: Enrique Sack M.D.   On: 05/21/2013 00:21     EKG Interpretation None      Date: 05/21/2013  Rate: 112  Rhythm: normal  sinus rhythm  QRS Axis: normal  Intervals: normal  ST/T Wave abnormalities: normal  Conduction Disutrbances: none  Narrative Interpretation: unremarkable  CARDIAC CATH 2012: IMPRESSION:  1. Large, tortuous coronary arteries with no angiographic evidence,  significant coronary disease.  2. Most likely diagnosis of chest pain as nonanginal in chest pain.  All the coronary spasm maybe potential.     MDM   Final diagnoses:  None    Pt comes in with cc of weakness. Hx of negative Cath in 2012.  Pt noted to be tachycardic, otherwise, neuro and cardio-pulm exams are normal. She has no hx pf DVT, PE and no risk factors for the same and upon further questioning it appears that she hasn't been taking her low dose metop, which was started for her tachycardia.  Pt's episode lasted for a 30 + minutes, she feels much better right now.  The event appears to be a vasovagal type event. With the near syncope, her SF score is 0.  Pt advised to see her PCP soon, for her refills and for a routine evaluation again.    Varney Biles, MD 05/21/13 [phone]

## 2013-05-23 ENCOUNTER — Ambulatory Visit (INDEPENDENT_AMBULATORY_CARE_PROVIDER_SITE_OTHER): Payer: 59 | Admitting: Cardiology

## 2013-05-23 ENCOUNTER — Encounter: Payer: Self-pay | Admitting: Cardiology

## 2013-05-23 VITALS — BP 142/96 | HR 93 | Ht 61.0 in | Wt 175.0 lb

## 2013-05-23 DIAGNOSIS — I2510 Atherosclerotic heart disease of native coronary artery without angina pectoris: Secondary | ICD-10-CM | POA: Insufficient documentation

## 2013-05-23 DIAGNOSIS — E668 Other obesity: Secondary | ICD-10-CM | POA: Insufficient documentation

## 2013-05-23 DIAGNOSIS — IMO0001 Reserved for inherently not codable concepts without codable children: Secondary | ICD-10-CM | POA: Insufficient documentation

## 2013-05-23 DIAGNOSIS — F32A Depression, unspecified: Secondary | ICD-10-CM | POA: Insufficient documentation

## 2013-05-23 DIAGNOSIS — Z9114 Patient's other noncompliance with medication regimen: Secondary | ICD-10-CM

## 2013-05-23 DIAGNOSIS — F419 Anxiety disorder, unspecified: Secondary | ICD-10-CM

## 2013-05-23 DIAGNOSIS — F411 Generalized anxiety disorder: Secondary | ICD-10-CM

## 2013-05-23 DIAGNOSIS — R5383 Other fatigue: Secondary | ICD-10-CM

## 2013-05-23 DIAGNOSIS — F329 Major depressive disorder, single episode, unspecified: Secondary | ICD-10-CM | POA: Insufficient documentation

## 2013-05-23 DIAGNOSIS — Z0389 Encounter for observation for other suspected diseases and conditions ruled out: Secondary | ICD-10-CM

## 2013-05-23 DIAGNOSIS — Z9119 Patient's noncompliance with other medical treatment and regimen: Secondary | ICD-10-CM

## 2013-05-23 DIAGNOSIS — E669 Obesity, unspecified: Secondary | ICD-10-CM

## 2013-05-23 DIAGNOSIS — Z91199 Patient's noncompliance with other medical treatment and regimen due to unspecified reason: Secondary | ICD-10-CM

## 2013-05-23 DIAGNOSIS — R002 Palpitations: Secondary | ICD-10-CM

## 2013-05-23 DIAGNOSIS — G4719 Other hypersomnia: Secondary | ICD-10-CM

## 2013-05-23 DIAGNOSIS — R5381 Other malaise: Secondary | ICD-10-CM

## 2013-05-23 DIAGNOSIS — G4710 Hypersomnia, unspecified: Secondary | ICD-10-CM

## 2013-05-23 DIAGNOSIS — R29818 Other symptoms and signs involving the nervous system: Secondary | ICD-10-CM | POA: Insufficient documentation

## 2013-05-23 NOTE — Assessment & Plan Note (Signed)
Reassured her heart was fine

## 2013-05-23 NOTE — Assessment & Plan Note (Signed)
Recently seen in the ER after she ran out of her Metoprolol

## 2013-05-23 NOTE — Patient Instructions (Signed)
You may return to work Monday 3/23. Your physician has recommended that you have a sleep study. This test records several body functions during sleep, including: brain activity, eye movement, oxygen and carbon dioxide blood levels, heart rate and rhythm, breathing rate and rhythm, the flow of air through your mouth and nose, snoring, body muscle movements, and chest and belly movement. Your physician recommends that you schedule a follow-up appointment in: one month with Dr Ellyn Hack

## 2013-05-23 NOTE — Progress Notes (Signed)
    05/23/2013 Abigail [name]   1965-04-28  [id number]  Primary Physicia Fae Pippin Primary Cardiologist: Dr Ellyn Hack  HPI:  48 y/o obese female we saw in May 2012 for chest pain. She had a cath showing normal coronaries and normal LVF. She has been treated with low dose beta blocker for palpitations. She says she ran out of her Metoprolol about two weeks ago. She presented to the ER with multiple complints including weakness, chest tightness, and nausea. She says she has been under a lot of stress. She is in the office today for follow up after resuming her Metoprolol. She is anxious but better. She does complain of fatigue. She snores and is obese, and I suspect she has sleep apnea.    Current Outpatient Prescriptions  Medication Sig Dispense Refill  . esomeprazole (NEXIUM) 40 MG capsule Take 40 mg by mouth daily before breakfast.      . ibuprofen (ADVIL,MOTRIN) 200 MG tablet Take 400 mg by mouth every 6 (six) hours as needed for pain or headache.      . IRON PO Take 1 tablet by mouth every other day.      . metoprolol tartrate (LOPRESSOR) 25 MG tablet Take 12.5 mg by mouth daily.       No current facility-administered medications for this visit.    No Known Allergies  History   Social History  . Marital Status: Married    Spouse Name: N/A    Number of Children: N/A  . Years of Education: N/A   Occupational History  . Not on file.   Social History Main Topics  . Smoking status: Never Smoker   . Smokeless tobacco: Not on file  . Alcohol Use: No  . Drug Use: No  . Sexual Activity: Not on file   Other Topics Concern  . Not on file   Social History Narrative  . No narrative on file     Review of Systems: General: negative for chills, fever, night sweats or weight changes.  Cardiovascular: negative for chest pain, dyspnea on exertion, edema, orthopnea, palpitations, paroxysmal nocturnal dyspnea or shortness of breath Dermatological: negative for  rash Respiratory: negative for cough or wheezing Urologic: negative for hematuria Abdominal: negative for nausea, vomiting, diarrhea, bright red blood per rectum, melena, or hematemesis Neurologic: negative for visual changes, syncope, or dizziness All other systems reviewed and are otherwise negative except as noted above.    Blood pressure 142/96, pulse 93, height 5' 1"  (1.549 m), weight 175 lb (79.379 kg), last menstrual period 05/07/2013.  General appearance: alert, cooperative and moderately obese Lungs: clear to auscultation bilaterally Heart: regular rate and rhythm  EKG NSR ASSESSMENT AND PLAN:   History of medication noncompliance Recently seen in the ER after she ran out of her Metoprolol  Normal coronary arteries- 2012 .  Obesity- BMI 33 .  Fatigue- suspected sleep apnea .  Anxiety Reassured her heart was fine   PLAN  Sleep study. Reassured her heart is OK. Follow up with Dr Ellyn Hack.   Jasmine Mcbeth KPA-C 05/23/2013 11:23 AM

## 2013-06-02 ENCOUNTER — Other Ambulatory Visit: Payer: Self-pay | Admitting: *Deleted

## 2013-06-02 MED ORDER — METOPROLOL TARTRATE 25 MG PO TABS: 12.5000 mg | ORAL_TABLET | Freq: Every day | ORAL | Status: DC

## 2013-07-13 ENCOUNTER — Ambulatory Visit: Payer: 59 | Admitting: Cardiology

## 2013-07-17 ENCOUNTER — Other Ambulatory Visit: Payer: Self-pay | Admitting: Urology

## 2013-07-17 DIAGNOSIS — D49519 Neoplasm of unspecified behavior of unspecified kidney: Secondary | ICD-10-CM

## 2013-09-03 ENCOUNTER — Emergency Department (HOSPITAL_COMMUNITY)
Admission: EM | Admit: 2013-09-03 | Discharge: 2013-09-04 | Disposition: A | Discharge status: Home / Self Care | Payer: 59 | Attending: Emergency Medicine | Admitting: Emergency Medicine

## 2013-09-03 ENCOUNTER — Emergency Department (HOSPITAL_COMMUNITY): Payer: 59

## 2013-09-03 ENCOUNTER — Encounter (HOSPITAL_COMMUNITY): Payer: Self-pay | Admitting: Emergency Medicine

## 2013-09-03 DIAGNOSIS — Y929 Unspecified place or not applicable: Secondary | ICD-10-CM | POA: Insufficient documentation

## 2013-09-03 DIAGNOSIS — Z79899 Other long term (current) drug therapy: Secondary | ICD-10-CM | POA: Insufficient documentation

## 2013-09-03 DIAGNOSIS — Y9389 Activity, other specified: Secondary | ICD-10-CM | POA: Insufficient documentation

## 2013-09-03 DIAGNOSIS — S60212A Contusion of left wrist, initial encounter: Secondary | ICD-10-CM

## 2013-09-03 DIAGNOSIS — R110 Nausea: Secondary | ICD-10-CM | POA: Insufficient documentation

## 2013-09-03 DIAGNOSIS — S60219A Contusion of unspecified wrist, initial encounter: Secondary | ICD-10-CM | POA: Insufficient documentation

## 2013-09-03 DIAGNOSIS — W64XXXA Exposure to other animate mechanical forces, initial encounter: Secondary | ICD-10-CM | POA: Insufficient documentation

## 2013-09-03 DIAGNOSIS — Z8679 Personal history of other diseases of the circulatory system: Secondary | ICD-10-CM | POA: Insufficient documentation

## 2013-09-03 NOTE — ED Provider Notes (Signed)
CSN: [id number]     Arrival date & time 09/03/13  2205 History  This chart was scribed for non-physician practitioner, Carlisle Cater, PA-C, working with Merryl Hacker, MD by Roe Coombs, ED Scribe. This patient was seen in room TR05C/TR05C and the patient's care was started at 11:36 PM.   Chief Complaint  Patient presents with  . Wrist Pain    The history is provided by the patient. No language interpreter was used.    HPI Comments: Abigail [name] is a 48 y.o. female who presents to the Emergency Department complaining of constant, aching, left anterior wrist pain with associated bruising onset this morning at about 5:20 AM. Patient states that she injured her wrist when her dog yanked on the leash and the leash pulled against her wrist creating a traction injury. Patient noted a large collection of blood underneath the skin of her anterior wrist that has improved. Patient reports that pain has gradually improved since the injury. She states that she had some nausea earlier today. Patient denies numbness or weakness in the extremities, fever, chills, or vomiting. She is not on any anticoagulants.   Past Medical History  Diagnosis Date  . Heart disease    History reviewed. No pertinent past surgical history. No family history on file. History  Substance Use Topics  . Smoking status: Never Smoker   . Smokeless tobacco: Not on file  . Alcohol Use: No   OB History   Grav Para Term Preterm Abortions TAB SAB Ect Mult Living                 Review of Systems  Constitutional: Negative for fever and chills.  Gastrointestinal: Positive for nausea. Negative for vomiting.  Musculoskeletal: Positive for myalgias (left anterior wrist).  Skin: Positive for color change (bruising to left anterior wrist).  Neurological: Negative for weakness and numbness.    Allergies  Review of patient's allergies indicates no known allergies.  Home Medications   Prior to Admission medications    Medication Sig Start Date End Date Taking? Authorizing Provider  esomeprazole (NEXIUM) 40 MG capsule Take 40 mg by mouth daily before breakfast.    Historical Provider, MD  ibuprofen (ADVIL,MOTRIN) 200 MG tablet Take 400 mg by mouth every 6 (six) hours as needed for pain or headache.    Historical Provider, MD  IRON PO Take 1 tablet by mouth every other day.    Historical Provider, MD  metoprolol tartrate (LOPRESSOR) 25 MG tablet Take 0.5 tablets (12.5 mg total) by mouth daily. 06/02/13   Erlene Quan, PA-C   Triage Vitals: BP 138/79  Pulse 84  Temp(Src) 98.8 F (37.1 C) (Oral)  Resp 14  Ht 5' 2"  (1.575 m)  Wt 175 lb (79.379 kg)  BMI 32.00 kg/m2  SpO2 98%  LMP 08/14/2013  Physical Exam  Nursing note and vitals reviewed. Constitutional: She appears well-developed and well-nourished. No distress.  HENT:  Head: Normocephalic and atraumatic.  Eyes: Conjunctivae and EOM are normal.  Neck: Neck supple. No tracheal deviation present.  Cardiovascular: Normal rate.   Pulses:      Radial pulses are 2+ on the right side, and 2+ on the left side.  Pulmonary/Chest: Effort normal. No respiratory distress.  Musculoskeletal: Normal range of motion. She exhibits tenderness.       Left elbow: Normal.       Left wrist: She exhibits tenderness and swelling (Soft hematoma anterior left wrist measuring approximately 3 cm x 1 cm.). She  exhibits normal range of motion and no laceration.       Left hand: Normal.  Neurological: She is alert.  Skin: Skin is warm and dry.  Psychiatric: She has a normal mood and affect. Her behavior is normal.    ED Course  Procedures (including critical care time) DIAGNOSTIC STUDIES: Oxygen Saturation is 98% on room air, normal by my interpretation.    COORDINATION OF CARE: 11:41 PM- Patient informed of current plan for treatment and evaluation and agrees with plan at this time.   Patient Vitals for the past 24 hrs:  BP Temp Temp src Pulse Resp SpO2 Height Weight   09/03/13 2221 138/79 mmHg 98.8 F (37.1 C) Oral 84 14 98 % 5' 2"  (1.575 m) 175 lb (79.379 kg)    Imaging Review Dg Wrist Complete Left  09/03/2013   CLINICAL DATA:  Wrist pain  EXAM: LEFT WRIST - COMPLETE 3+ VIEW  COMPARISON:  None.  FINDINGS: There is no evidence of fracture or dislocation. There is no evidence of arthropathy or other focal bone abnormality.  IMPRESSION: Negative.   Electronically Signed   By: Jorje Guild M.D.   On: 09/03/2013 23:30   Patient provided with wrist splint for comfort.  She will use NSAIDs for pain and swelling. Patient counseled to elevate extremity, use ice pack 15 minutes every hour. Patient verbalizes understanding and agrees with plan.   MDM   Final diagnoses:  Traumatic hematoma of wrist, left, initial encounter   Patient with hematoma of left wrist. She has full range of motion. X-rays negative for bony injury. Normal radial pulses. Do not suspect vascular injury. Compartments of forearm are soft. Patient had likely venous bleeding which is now stable and controlled. No progression during time in emergency department. Conservative management should do well for patient. She was counseled on signs and symptoms to return.  I personally performed the services described in this documentation, which was scribed in my presence. The recorded information has been reviewed and is accurate.    Carlisle Cater, PA-C 09/04/13 [phone]

## 2013-09-03 NOTE — ED Notes (Signed)
Pt. presents with left anterior wrist bruise / mild swelling injured from her dog's leash this morning .

## 2013-09-04 NOTE — ED Notes (Signed)
L velcro wrist splint in place, CMS intact, cap refill <2sec

## 2013-09-04 NOTE — Discharge Instructions (Signed)
Please read and follow all provided instructions.  Your diagnoses today include:  1. Traumatic hematoma of wrist, left, initial encounter    Tests performed today include:  An x-ray of the affected area - does NOT show any broken bones  Vital signs. See below for your results today.   Medications prescribed:   None  Take any prescribed medications only as directed.  Home care instructions:   Follow any educational materials contained in this packet  Follow R.I.C.E. Protocol:  R - rest your injury   I  - use ice on injury without applying directly to skin  C - compress injury with bandage or splint  E - elevate the injury as much as possible  Follow-up instructions: Please follow-up with your primary care provider if you continue to have significant pain in 1 week. In this case you may have a severe injury that requires further care.   Return instructions:   Please return if your fingers are numb or tingling, appear gray or blue, or you have severe pain (also elevate leg and loosen splint or wrap if you were given one)  Please return to the Emergency Department if you experience worsening symptoms.   Please return if you have any other emergent concerns.  Additional Information:  Your vital signs today were: BP 138/79   Pulse 84   Temp(Src) 98.8 F (37.1 C) (Oral)   Resp 14   Ht 5' 2"  (1.575 m)   Wt 175 lb (79.379 kg)   BMI 32.00 kg/m2   SpO2 98%   LMP 08/14/2013 If your blood pressure (BP) was elevated above 135/85 this visit, please have this repeated by your doctor within one month. --------------

## 2013-09-04 NOTE — ED Provider Notes (Signed)
Medical screening examination/treatment/procedure(s) were performed by non-physician practitioner and as supervising physician I was immediately available for consultation/collaboration.   EKG Interpretation None        Merryl Hacker, MD 09/04/13 [phone]

## 2013-09-11 ENCOUNTER — Ambulatory Visit (HOSPITAL_COMMUNITY): Admission: RE | Admit: 2013-09-11 | Payer: 59 | Source: Ambulatory Visit

## 2013-12-22 ENCOUNTER — Telehealth: Payer: Self-pay | Admitting: Nurse Practitioner

## 2013-12-22 ENCOUNTER — Telehealth: Payer: Self-pay | Admitting: Cardiology

## 2013-12-22 NOTE — Telephone Encounter (Signed)
Returned call to patient no answer.LMTC. 

## 2013-12-22 NOTE — Telephone Encounter (Signed)
Pt was returning Cheryl's call

## 2013-12-22 NOTE — Telephone Encounter (Signed)
I agree that this is probably best served with ER evaluation.  Leonie Man, MD

## 2013-12-22 NOTE — Telephone Encounter (Signed)
Pt called back.  She was advised to present to ED earlier after reporting chest and elbow pain to our [place] office.  She took a bayer back and body w/o relief and is asking if there's anything else that she can take.  I reiterated that if she's been having chest and arm pain, she will be best served by evaluation in the ED.  She verbalized understanding and said she'll think about going there.

## 2013-12-22 NOTE — Telephone Encounter (Signed)
Please call,been hurting in left arm and shoulder,chest and back.She says is also hurting in right arm and shoulder.

## 2013-12-22 NOTE — Telephone Encounter (Signed)
Returned call to patient she stated she has been having chest pain,mid back pain,pain between shoulder blades for the past 2 days.Also tightness rt elbow.Rates pain # 6.Advised patient to go to [facility] ER.Trish called.

## 2014-04-08 ENCOUNTER — Emergency Department (HOSPITAL_COMMUNITY)
Admission: EM | Admit: 2014-04-08 | Discharge: 2014-04-08 | Disposition: A | Discharge status: Home / Self Care | Payer: 59 | Attending: Emergency Medicine | Admitting: Emergency Medicine

## 2014-04-08 ENCOUNTER — Emergency Department (HOSPITAL_COMMUNITY): Payer: 59

## 2014-04-08 ENCOUNTER — Encounter (HOSPITAL_COMMUNITY): Payer: Self-pay | Admitting: Emergency Medicine

## 2014-04-08 DIAGNOSIS — Z8679 Personal history of other diseases of the circulatory system: Secondary | ICD-10-CM | POA: Diagnosis not present

## 2014-04-08 DIAGNOSIS — R109 Unspecified abdominal pain: Secondary | ICD-10-CM | POA: Diagnosis not present

## 2014-04-08 DIAGNOSIS — D259 Leiomyoma of uterus, unspecified: Secondary | ICD-10-CM | POA: Diagnosis not present

## 2014-04-08 DIAGNOSIS — Z79899 Other long term (current) drug therapy: Secondary | ICD-10-CM | POA: Diagnosis not present

## 2014-04-08 DIAGNOSIS — Z3202 Encounter for pregnancy test, result negative: Secondary | ICD-10-CM | POA: Insufficient documentation

## 2014-04-08 LAB — CBC WITH DIFFERENTIAL/PLATELET
Basophils Absolute: 0 10*3/uL (ref 0.0–0.1)
Basophils Relative: 0 % (ref 0–1)
Eosinophils Absolute: 0.2 10*3/uL (ref 0.0–0.7)
Eosinophils Relative: 2 % (ref 0–5)
HCT: 39.5 % (ref 36.0–46.0)
Hemoglobin: 13.1 g/dL (ref 12.0–15.0)
LYMPHS PCT: 19 % (ref 12–46)
Lymphs Abs: 2.2 10*3/uL (ref 0.7–4.0)
MCH: 27.7 pg (ref 26.0–34.0)
MCHC: 33.2 g/dL (ref 30.0–36.0)
MCV: 83.5 fL (ref 78.0–100.0)
Monocytes Absolute: 0.5 10*3/uL (ref 0.1–1.0)
Monocytes Relative: 5 % (ref 3–12)
Neutro Abs: 8.4 10*3/uL — ABNORMAL HIGH (ref 1.7–7.7)
Neutrophils Relative %: 74 % (ref 43–77)
PLATELETS: 306 10*3/uL (ref 150–400)
RBC: 4.73 MIL/uL (ref 3.87–5.11)
RDW: 13.6 % (ref 11.5–15.5)
WBC: 11.3 10*3/uL — ABNORMAL HIGH (ref 4.0–10.5)

## 2014-04-08 LAB — COMPREHENSIVE METABOLIC PANEL
ALT: 32 U/L (ref 0–35)
AST: 27 U/L (ref 0–37)
Albumin: 3.7 g/dL (ref 3.5–5.2)
Alkaline Phosphatase: 94 U/L (ref 39–117)
Anion gap: 8 (ref 5–15)
BUN: 6 mg/dL (ref 6–23)
CHLORIDE: 102 mmol/L (ref 96–112)
CO2: 26 mmol/L (ref 19–32)
Calcium: 9 mg/dL (ref 8.4–10.5)
Creatinine, Ser: 0.62 mg/dL (ref 0.50–1.10)
GFR calc Af Amer: >90 mL/min (ref >90)
GFR calc non Af Amer: >90 mL/min (ref >90)
GLUCOSE: 97 mg/dL (ref 70–99)
Potassium: 3.7 mmol/L (ref 3.5–5.1)
Sodium: 136 mmol/L (ref 135–145)
Total Bilirubin: 0.8 mg/dL (ref 0.3–1.2)
Total Protein: 6.7 g/dL (ref 6.0–8.3)

## 2014-04-08 LAB — URINALYSIS, ROUTINE W REFLEX MICROSCOPIC
BILIRUBIN URINE: NEGATIVE
GLUCOSE, UA: NEGATIVE mg/dL
Hgb urine dipstick: NEGATIVE
Ketones, ur: NEGATIVE mg/dL
LEUKOCYTES UA: NEGATIVE
NITRITE: NEGATIVE
PH: 7 (ref 5.0–8.0)
PROTEIN: NEGATIVE mg/dL
SPECIFIC GRAVITY, URINE: 1.015 (ref 1.005–1.030)
Urobilinogen, UA: 0.2 mg/dL (ref 0.0–1.0)

## 2014-04-08 LAB — POC URINE PREG, ED: PREG TEST UR: NEGATIVE

## 2014-04-08 LAB — LIPASE, BLOOD: Lipase: 29 U/L (ref 11–59)

## 2014-04-08 MED ORDER — OXYCODONE-ACETAMINOPHEN 5-325 MG PO TABS: 1.0000 | ORAL_TABLET | ORAL | Status: DC | PRN

## 2014-04-08 MED ORDER — IOHEXOL 300 MG/ML  SOLN
100.0000 mL | Freq: Once | INTRAMUSCULAR | Status: AC | PRN
  Administered 2014-04-08: 100 mL via INTRAVENOUS

## 2014-04-08 MED ORDER — ONDANSETRON 4 MG PO TBDP
8.0000 mg | ORAL_TABLET | Freq: Once | ORAL | Status: AC
  Administered 2014-04-08: 8 mg via ORAL
  Filled 2014-04-08: qty 2

## 2014-04-08 MED ORDER — ONDANSETRON HCL 4 MG/2ML IJ SOLN
4.0000 mg | Freq: Once | INTRAMUSCULAR | Status: AC
  Administered 2014-04-08: 4 mg via INTRAVENOUS
  Filled 2014-04-08: qty 2

## 2014-04-08 MED ORDER — OXYCODONE-ACETAMINOPHEN 5-325 MG PO TABS
2.0000 | ORAL_TABLET | Freq: Once | ORAL | Status: AC
  Administered 2014-04-08: 2 via ORAL
  Filled 2014-04-08: qty 2

## 2014-04-08 MED ORDER — IOHEXOL 300 MG/ML  SOLN
25.0000 mL | Freq: Once | INTRAMUSCULAR | Status: AC | PRN
  Administered 2014-04-08: 25 mL via ORAL

## 2014-04-08 MED ORDER — MORPHINE SULFATE 4 MG/ML IJ SOLN
4.0000 mg | Freq: Once | INTRAMUSCULAR | Status: AC
  Administered 2014-04-08: 4 mg via INTRAVENOUS
  Filled 2014-04-08: qty 1

## 2014-04-08 NOTE — ED Notes (Signed)
Pt. Left with all belongings

## 2014-04-08 NOTE — ED Provider Notes (Signed)
CSN: [id number]     Arrival date & time 04/08/14  1739 History   First MD Initiated Contact with Patient 04/08/14 1810     Chief Complaint  Patient presents with  . Abdominal Pain    Patient is a 49 y.o. female presenting with flank pain. The history is provided by the patient.  Flank Pain This is a new problem. The current episode started more than 2 days ago. The problem occurs daily. The problem has been gradually worsening. Associated symptoms include abdominal pain. Pertinent negatives include no chest pain and no shortness of breath. Exacerbated by: movement/palpation. The symptoms are relieved by rest.  Patient presents with right flank pain that radiates into right lower abdomen It became worse today No fever/vomiting She reports urinary frequency No vag bleeding/discharge She reports h/o ovarian cysts as well as kidney stones but this is "different"  Past Medical History  Diagnosis Date  . Heart disease    History reviewed. No pertinent past surgical history. No family history on file. History  Substance Use Topics  . Smoking status: Never Smoker   . Smokeless tobacco: Not on file  . Alcohol Use: No   OB History    No data available     Review of Systems  Constitutional: Negative for fever.  Respiratory: Negative for shortness of breath.   Cardiovascular: Negative for chest pain.  Gastrointestinal: Positive for nausea and abdominal pain.  Genitourinary: Positive for flank pain. Negative for vaginal bleeding and vaginal discharge.  All other systems reviewed and are negative.     Allergies  Review of patient's allergies indicates no known allergies.  Home Medications   Prior to Admission medications   Medication Sig Start Date End Date Taking? Authorizing Provider  esomeprazole (NEXIUM) 40 MG capsule Take 40 mg by mouth daily before breakfast.    Historical Provider, MD  ibuprofen (ADVIL,MOTRIN) 200 MG tablet Take 400 mg by mouth every 6 (six) hours as  needed for pain or headache.    Historical Provider, MD  IRON PO Take 1 tablet by mouth every other day.    Historical Provider, MD  metoprolol tartrate (LOPRESSOR) 25 MG tablet Take 0.5 tablets (12.5 mg total) by mouth daily. 06/02/13   Doreene Burke Kilroy, PA-C   BP 137/76 mmHg  Pulse 90  Temp(Src) 98.2 F (36.8 C) (Oral)  Resp 18  SpO2 97%  LMP 03/10/2014 Physical Exam CONSTITUTIONAL: Well developed/well nourished HEAD: Normocephalic/atraumatic EYES: EOMI/PERRL ENMT: Mucous membranes moist NECK: supple no meningeal signs SPINE/BACK:entire spine nontender CV: S1/S2 noted, no murmurs/rubs/gallops noted LUNGS: Lungs are clear to auscultation bilaterally, no apparent distress ABDOMEN: soft, mild tenderness in RLQ, no rebound or guarding, bowel sounds noted throughout abdomen GU:no cva tenderness. No cmt.  Uterine tenderness noted.  Right adnexal tenderness.  No left adnexal tenderness noted NEURO: Pt is awake/alert/appropriate, moves all extremitiesx4.  No facial droop.   EXTREMITIES: pulses normal/equal, full ROM SKIN: warm, color normal PSYCH: no abnormalities of mood noted, alert and oriented to situation  ED Course  Procedures  7:50 PM Pt with continued pain, now diffusely tender, but most significant pain is in RLQ  Pelvic exam unrevealing.  Ct imaging ordered 10:16 PM Imaging negative Pt stable/well appearing Advised of findings and need for f/u with GYN BP 108/67 mmHg  Pulse 81  Temp(Src) 98.2 F (36.8 C) (Oral)  Resp 18  SpO2 94%  LMP 03/10/2014  Labs Review Labs Reviewed  CBC WITH DIFFERENTIAL/PLATELET - Abnormal; Notable for the following:  WBC 11.3 (*)    Neutro Abs 8.4 (*)    All other components within normal limits  COMPREHENSIVE METABOLIC PANEL  LIPASE, BLOOD  URINALYSIS, ROUTINE W REFLEX MICROSCOPIC  POC URINE PREG, ED    Imaging Review Ct Abdomen Pelvis W Contrast  04/08/2014   CLINICAL DATA:  Generalized abdominal pain with frequent urination for  3-4 days. Pain worse on the right and radiates to right flank.  EXAM: CT ABDOMEN AND PELVIS WITH CONTRAST  TECHNIQUE: Multidetector CT imaging of the abdomen and pelvis was performed using the standard protocol following bolus administration of intravenous contrast.  CONTRAST:  86m OMNIPAQUE IOHEXOL 300 MG/ML SOLN, 1066mOMNIPAQUE IOHEXOL 300 MG/ML SOLN  COMPARISON:  None.  FINDINGS: The lung bases are clear.  Small sub cm hypodensity in the dome of the left lobe of the liver, too small to characterize. No additional focal hepatic lesion. The gallbladder is physiologically distended without calcified gallstone. No pericholecystic fluid or edema. The adrenal glands, pancreas, and spleen are normal.  Kidneys are symmetric in size with symmetric enhancement. There is no hydronephrosis or perinephric stranding. There are multiple hypodensities in the left kidney, likely cyst, largest measuring 2.4 cm in the interpolar region. Remaining hypodensities are subcentimeter. There are tiny nonobstructing stones in the left kidney. Small subcentimeter right renal cysts are also noted.  There are no dilated or thickened bowel loops. The appendix is well seen and normal. There is small pericolonic rounded hyperdensities, adjacent to the hepatic flexure of the colon, and the descending colon. These may reflect calcified pericolonic lymph nodes versus less likely enteric contrast within colonic diverticula. There is no surrounding inflammation.  No free air, free fluid, or intra-abdominal fluid collection. The abdominal aorta is normal in caliber. There is no retroperitoneal adenopathy.  Within the pelvis the urinary bladder is decompressed. Mild heterogeneity the uterine parenchyma may reflect underlying fibroid. There is a probable corpus luteal cyst in the left ovary. Both ovaries are normal in size. There is a small amount of free fluid in the pelvis.  Bilateral L5 pars interarticularis defects without listhesis. There is no  acute or suspicious osseous abnormality.  IMPRESSION: 1. No acute abnormality in the abdomen/pelvis. 2. Nonobstructing stones in the left kidney.  Bilateral renal cysts. 3. Incidental findings of possible uterine fibroid and bilateral L5 pars interarticularis defects without listhesis.   Electronically Signed   By: MeJeb Levering.D.   On: 04/08/2014 21:29    Medications  oxyCODONE-acetaminophen (PERCOCET/ROXICET) 5-325 MG per tablet 2 tablet (2 tablets Oral Given 04/08/14 1833)  ondansetron (ZOFRAN-ODT) disintegrating tablet 8 mg (8 mg Oral Given 04/08/14 1832)  ondansetron (ZOFRAN) injection 4 mg (4 mg Intravenous Given 04/08/14 2031)  morphine 4 MG/ML injection 4 mg (4 mg Intravenous Given 04/08/14 2032)  iohexol (OMNIPAQUE) 300 MG/ML solution 25 mL (25 mLs Oral Contrast Given 04/08/14 2004)  iohexol (OMNIPAQUE) 300 MG/ML solution 100 mL (100 mLs Intravenous Contrast Given 04/08/14 2057)     MDM   Final diagnoses:  Abdominal pain, unspecified abdominal location  Uterine leiomyoma, unspecified location    Nursing notes including past medical history and social history reviewed and considered in documentation Labs/vital reviewed myself and considered during evaluation     DoSharyon CableMD 04/08/14 2217

## 2014-04-08 NOTE — Discharge Instructions (Signed)
Abdominal (belly) pain can be caused by many things. any cases can be observed and treated at home after initial evaluation in the emergency department. Even though you are being discharged home, abdominal pain can be unpredictable.  SEEK IMMEDIATE MEDICAL ATTENTION IF: The pain does not go away or becomes severe, particularly over the next 8-12 hours.  A temperature above 100.32F develops.  Repeated vomiting occurs (multiple episodes).  The pain becomes localized to portions of the abdomen. The right side could possibly be appendicitis. In an adult, the left lower portion of the abdomen could be colitis or diverticulitis.  Blood is being passed in stools or vomit (bright red or black tarry stools).  Return also if you develop chest pain, difficulty breathing, dizziness or fainting, or become confused, poorly responsive, or inconsolable.

## 2014-04-08 NOTE — ED Notes (Signed)
C/o generalized abd pain with frequent urination x 3-4 days.  Pain is worse on R lower side and radiates to R flank.

## 2014-05-14 DIAGNOSIS — Z87442 Personal history of urinary calculi: Secondary | ICD-10-CM | POA: Insufficient documentation

## 2014-05-14 DIAGNOSIS — Z8679 Personal history of other diseases of the circulatory system: Secondary | ICD-10-CM | POA: Diagnosis not present

## 2014-05-14 DIAGNOSIS — J40 Bronchitis, not specified as acute or chronic: Secondary | ICD-10-CM | POA: Diagnosis not present

## 2014-05-14 DIAGNOSIS — Z79899 Other long term (current) drug therapy: Secondary | ICD-10-CM | POA: Insufficient documentation

## 2014-05-14 DIAGNOSIS — R05 Cough: Secondary | ICD-10-CM | POA: Diagnosis present

## 2014-05-15 ENCOUNTER — Emergency Department (HOSPITAL_COMMUNITY)
Admission: EM | Admit: 2014-05-15 | Discharge: 2014-05-15 | Disposition: A | Discharge status: Home / Self Care | Payer: 59 | Attending: Emergency Medicine | Admitting: Emergency Medicine

## 2014-05-15 ENCOUNTER — Emergency Department (HOSPITAL_COMMUNITY): Payer: 59

## 2014-05-15 ENCOUNTER — Encounter (HOSPITAL_COMMUNITY): Payer: Self-pay | Admitting: Emergency Medicine

## 2014-05-15 DIAGNOSIS — R059 Cough, unspecified: Secondary | ICD-10-CM

## 2014-05-15 DIAGNOSIS — J40 Bronchitis, not specified as acute or chronic: Secondary | ICD-10-CM

## 2014-05-15 DIAGNOSIS — R05 Cough: Secondary | ICD-10-CM

## 2014-05-15 HISTORY — DX: Calculus of kidney: N20.0

## 2014-05-15 MED ORDER — BENZONATATE 200 MG PO CAPS: 200.0000 mg | ORAL_CAPSULE | Freq: Three times a day (TID) | ORAL | Status: DC | PRN

## 2014-05-15 MED ORDER — BENZONATATE 100 MG PO CAPS: 200.0000 mg | ORAL_CAPSULE | Freq: Three times a day (TID) | ORAL | Status: DC | PRN

## 2014-05-15 MED ORDER — DEXAMETHASONE 4 MG PO TABS
10.0000 mg | ORAL_TABLET | Freq: Once | ORAL | Status: AC
  Administered 2014-05-15: 10 mg via ORAL
  Filled 2014-05-15: qty 2
  Filled 2014-05-15: qty 1

## 2014-05-15 MED ORDER — ALBUTEROL SULFATE HFA 108 (90 BASE) MCG/ACT IN AERS: 1.0000 | INHALATION_SPRAY | RESPIRATORY_TRACT | Status: DC | PRN

## 2014-05-15 NOTE — ED Notes (Signed)
Pt states she has had a cough since the last Friday in February  Pt states her ribs hurt from coughing Pt states she has tried multiple over the counter medications without relief  Pt states she has been to urgent care last Sunday and was given Zofran  Pt states she saw her dr on Tuesday and was given doxycyline and Hydrocodone cough syrup   Pt states she went back on Thursday and was given Levoquin and changed her cough syrup to cherry tussin  Pt states she is wore out and feels tired all the time and has had a fever off and on

## 2014-05-15 NOTE — ED Notes (Signed)
MD at bedside. 

## 2014-05-15 NOTE — Discharge Instructions (Signed)
It is normal to have cough associated with a viral bronchitis for several weeks.  Take medications as prescribed.  Adding a decongestant to help thin your mucous secretions may help with symptoms.  Drink plenty of fluids.  Hot tea with honey may help with your cough.  Follow-up with your doctor as needed.    Cough, Adult  A cough is a reflex that helps clear your throat and airways. It can help heal the body or may be a reaction to an irritated airway. A cough may only last 2 or 3 weeks (acute) or may last more than 8 weeks (chronic).  CAUSES Acute cough: 1. Viral or bacterial infections. Chronic cough:  Infections.  Allergies.  Asthma.  Post-nasal drip.  Smoking.  Heartburn or acid reflux.  Some medicines.  Chronic lung problems (COPD).  Cancer. SYMPTOMS   Cough.  Fever.  Chest pain.  Increased breathing rate.  High-pitched whistling sound when breathing (wheezing).  Colored mucus that you cough up (sputum). TREATMENT   A bacterial cough may be treated with antibiotic medicine.  A viral cough must run its course and will not respond to antibiotics.  Your caregiver may recommend other treatments if you have a chronic cough. HOME CARE INSTRUCTIONS   Only take over-the-counter or prescription medicines for pain, discomfort, or fever as directed by your caregiver. Use cough suppressants only as directed by your caregiver.  Use a cold steam vaporizer or humidifier in your bedroom or home to help loosen secretions.  Sleep in a semi-upright position if your cough is worse at night.  Rest as needed.  Stop smoking if you smoke. SEEK IMMEDIATE MEDICAL CARE IF:   You have pus in your sputum.  Your cough starts to worsen.  You cannot control your cough with suppressants and are losing sleep.  You begin coughing up blood.  You have difficulty breathing.  You develop pain which is getting worse or is uncontrolled with medicine.  You have a fever. MAKE SURE  YOU:   Understand these instructions.  Will watch your condition.  Will get help right away if you are not doing well or get worse. Document Released: 08/22/2010 Document Revised: 05/18/2011 Document Reviewed: 08/22/2010 [facility] Patient Information 2015 [place], [place]. This information is not intended to replace advice given to you by your health care provider. Make sure you discuss any questions you have with your health care provider.  Laryngitis Laryngitis is redness, soreness, and puffiness (inflammation) of the vocal cords. It causes hoarseness, cough, loss of voice, sore throat, and dry throat. It may be caused by: 2. Infection. 3. Too much smoking. 4. Too much talking or yelling. 5. Breathing in of toxic fumes. 6. Allergies. 7. A backup of acid from your stomach. HOME CARE  Drink enough fluids to keep your pee (urine) clear or pale yellow.  Rest until you no longer have problems or as told by your doctor.  Breathe in moist air.  Take all medicine as told by your doctor.  Do not smoke.  Talk as little as possible (this includes whispering).  Write on paper instead of talking until your voice is back to normal.  Follow up with your doctor if you have not improved after 10 days. GET HELP IF:   You have trouble breathing.  You cough up blood.  You have a fever that will not go away.  You have increasing pain.  You have trouble swallowing. MAKE SURE YOU:  Understand these instructions.  Will watch your  condition.  Will get help right away if you are not doing well or get worse. Document Released: 02/12/2011 Document Revised: 05/18/2011 Document Reviewed: 02/12/2011 [facility] Patient Information 2015 [place], [place]. This information is not intended to replace advice given to you by your health care provider. Make sure you discuss any questions you have with your health care provider.  How to Use an Inhaler Using your inhaler correctly is very important.  Good technique will make sure that the medicine reaches your lungs.  HOW TO USE AN INHALER: 8. Take the cap off the inhaler. 9. If this is the first time using your inhaler, you need to prime it. Shake the inhaler for 5 seconds. Release four puffs into the air, away from your face. Ask your doctor for help if you have questions. 10. Shake the inhaler for 5 seconds. 11. Turn the inhaler so the bottle is above the mouthpiece. 12. Put your pointer finger on top of the bottle. Your thumb holds the bottom of the inhaler. 13. Open your mouth. 14. Either hold the inhaler away from your mouth (the width of 2 fingers) or place your lips tightly around the mouthpiece. Ask your doctor which way to use your inhaler. 15. Breathe out as much air as possible. 16. Breathe in and push down on the bottle 1 time to release the medicine. You will feel the medicine go in your mouth and throat. 17. Continue to take a deep breath in very slowly. Try to fill your lungs. 18. After you have breathed in completely, hold your breath for 10 seconds. This will help the medicine to settle in your lungs. If you cannot hold your breath for 10 seconds, hold it for as long as you can before you breathe out. 19. Breathe out slowly, through pursed lips. Whistling is an example of pursed lips. 20. If your doctor has told you to take more than 1 puff, wait at least 15-30 seconds between puffs. This will help you get the best results from your medicine. Do not use the inhaler more than your doctor tells you to. 21. Put the cap back on the inhaler. 22. Follow the directions from your doctor or from the inhaler package about cleaning the inhaler. If you use more than one inhaler, ask your doctor which inhalers to use and what order to use them in. Ask your doctor to help you figure out when you will need to refill your inhaler.  If you use a steroid inhaler, always rinse your mouth with water after your last puff, gargle and spit out the  water. Do not swallow the water. GET HELP IF:  The inhaler medicine only partially helps to stop wheezing or shortness of breath.  You are having trouble using your inhaler.  You have some increase in thick spit (phlegm). GET HELP RIGHT AWAY IF:  The inhaler medicine does not help your wheezing or shortness of breath or you have tightness in your chest.  You have dizziness, headaches, or fast heart rate.  You have chills, fever, or night sweats.  You have a large increase of thick spit, or your thick spit is bloody. MAKE SURE YOU:   Understand these instructions.  Will watch your condition.  Will get help right away if you are not doing well or get worse. Document Released: 12/03/2007 Document Revised: 12/14/2012 Document Reviewed: 09/22/2012 [facility] Patient Information 2015 [place], [place]. This information is not intended to replace advice given to you by your health care provider. Make  sure you discuss any questions you have with your health care provider.

## 2014-05-15 NOTE — ED Provider Notes (Signed)
CSN: [id number]     Arrival date & time 05/14/14  2327 History   First MD Initiated Contact with Patient 05/15/14 0308     Chief Complaint  Patient presents with  . Cough     (Consider location/radiation/quality/duration/timing/severity/associated sxs/prior Treatment) HPI 49 year old female presents to emergency department with complaint of persistent cough.  Patient reports onset of cough 10 days ago.  She has been to her primary care doctor and to urgent care.  She has received prescriptions for doxycycline, Levaquin, Cheratussin, hydrocodone cough syrup, Zofran, Mucinex.  She reports she has not had any fevers in over a week.  Cough is occasionally productive.  Patient reports despite all of these medications, she is still coughing.  She is not a smoker.  She does not have history of COPD.  She reports similar episode about 2 years ago that after a month of coughing, she received a steroid shot and all of her symptoms went away. Past Medical History  Diagnosis Date  . Heart disease   . Kidney stones    Past Surgical History  Procedure Laterality Date  . Abdominal exploration surgery    . Tubal ligation    . Nasal sinus surgery     Family History  Problem Relation Age of Onset  . COPD Father   . Emphysema Father    History  Substance Use Topics  . Smoking status: Never Smoker   . Smokeless tobacco: Not on file  . Alcohol Use: No   OB History    No data available     Review of Systems  See History of Present Illness; otherwise all other systems are reviewed and negative   Allergies  Review of patient's allergies indicates no known allergies.  Home Medications   Prior to Admission medications   Medication Sig Start Date End Date Taking? Authorizing Provider  Aspirin-Caffeine 500-32.5 MG TABS Take 1-2 tablets by mouth every 6 (six) hours as needed (for pain).   Yes Historical Provider, MD  CHERATUSSIN AC 100-10 MG/5ML syrup Take 5 mLs by mouth every 6 (six) hours as  needed. 05/10/14  Yes Historical Provider, MD  Chlorpheniramine-PSE-Ibuprofen 2-30-200 MG TABS Take 1-2 tablets by mouth every 6 (six) hours as needed (for cold symptoms).   Yes Historical Provider, MD  esomeprazole (NEXIUM) 40 MG capsule Take 40 mg by mouth daily before breakfast.   Yes Historical Provider, MD  ibuprofen (ADVIL,MOTRIN) 200 MG tablet Take 400 mg by mouth every 6 (six) hours as needed for pain or headache.   Yes Historical Provider, MD  levofloxacin (LEVAQUIN) 500 MG tablet Take 500 mg by mouth daily. 05/10/14  Yes Historical Provider, MD  metoprolol tartrate (LOPRESSOR) 25 MG tablet Take 0.5 tablets (12.5 mg total) by mouth daily. 06/02/13  Yes Luke K Kilroy, PA-C  albuterol (PROVENTIL HFA;VENTOLIN HFA) 108 (90 BASE) MCG/ACT inhaler Inhale 1-2 puffs into the lungs every 4 (four) hours as needed for wheezing or shortness of breath (cough). 05/15/14   Linton Flemings, MD  benzonatate (TESSALON) 200 MG capsule Take 1 capsule (200 mg total) by mouth 3 (three) times daily as needed for cough. 05/15/14   Linton Flemings, MD  oxyCODONE-acetaminophen (PERCOCET/ROXICET) 5-325 MG per tablet Take 1 tablet by mouth every 4 (four) hours as needed for severe pain. Patient not taking: Reported on 05/15/2014 04/08/14   Ripley Fraise, MD   BP 143/84 mmHg  Pulse 83  Temp(Src) 98.4 F (36.9 C) (Oral)  Resp 18  Ht 5' 2"  (1.575 m)  Wt 170 lb (77.111 kg)  BMI 31.09 kg/m2  SpO2 100%  LMP 04/23/2014 (Approximate) Physical Exam  Constitutional: She is oriented to person, place, and time. She appears well-developed and well-nourished.  Patient with hoarse voice  HENT:  Head: Normocephalic and atraumatic.  Nose: Nose normal.  Mouth/Throat: Oropharynx is clear and moist.  Eyes: Conjunctivae and EOM are normal. Pupils are equal, round, and reactive to light.  Neck: Normal range of motion. Neck supple. No JVD present. No tracheal deviation present. No thyromegaly present.  Cardiovascular: Normal rate, regular rhythm,  normal heart sounds and intact distal pulses.  Exam reveals no gallop and no friction rub.   No murmur heard. Pulmonary/Chest: Effort normal and breath sounds normal. No stridor. No respiratory distress. She has no wheezes. She has no rales. She exhibits no tenderness.  Occasional cough  Abdominal: Soft. Bowel sounds are normal. She exhibits no distension and no mass. There is no tenderness. There is no rebound and no guarding.  Musculoskeletal: Normal range of motion. She exhibits no edema or tenderness.  Lymphadenopathy:    She has no cervical adenopathy.  Neurological: She is alert and oriented to person, place, and time. She displays normal reflexes. She exhibits normal muscle tone. Coordination normal.  Skin: Skin is warm and dry. No rash noted. No erythema. No pallor.  Psychiatric: She has a normal mood and affect. Her behavior is normal. Judgment and thought content normal.  Nursing note and vitals reviewed.   ED Course  Procedures (including critical care time) Labs Review Labs Reviewed - No data to display  Imaging Review Dg Chest 2 View  05/15/2014   CLINICAL DATA:  Cough since last Friday. Rib sore from coughing. Patient was at urgent care last Sunday and was given Zofran. Patient saw her Dr. On Tuesday and was given doxycycline and hydrocodone cough syrup. Patient again saw Dr. On Thursday and was given Levaquin. Cough syrup was changed to cherry tussin. Fever off and on.  EXAM: CHEST  2 VIEW  COMPARISON:  05/20/2013  FINDINGS: The heart size and mediastinal contours are within normal limits. Both lungs are clear. The visualized skeletal structures are unremarkable.  IMPRESSION: No active cardiopulmonary disease.   Electronically Signed   By: Lucienne Capers M.D.   On: 05/15/2014 01:18     EKG Interpretation None      MDM   Final diagnoses:  Cough  Bronchitis   49 year old female with persistent cough.  No signs of pneumonia on chest x-ray.  Patient has been counseled  that cough may last up to 2 months.  We'll try the addition of Tessalon Perles and albuterol inhaler.  Patient given a dose of dexamethasone as well to help cover for any reactive airway cause for her symptoms.  Patient advised that as she has only 3 days left of her Levaquin.  She should continue it.  Follow-up with PCP.  Linton Flemings, MD 05/15/14 [id number]

## 2014-05-16 ENCOUNTER — Ambulatory Visit: Payer: 59 | Admitting: Primary Care

## 2014-06-11 ENCOUNTER — Ambulatory Visit: Payer: 59 | Admitting: Primary Care

## 2014-06-29 ENCOUNTER — Encounter: Payer: Self-pay | Admitting: Primary Care

## 2014-06-29 ENCOUNTER — Telehealth: Payer: Self-pay | Admitting: Primary Care

## 2014-06-29 ENCOUNTER — Ambulatory Visit (INDEPENDENT_AMBULATORY_CARE_PROVIDER_SITE_OTHER)
Admission: RE | Admit: 2014-06-29 | Discharge: 2014-06-29 | Disposition: A | Discharge status: Home / Self Care | Payer: 59 | Source: Ambulatory Visit | Attending: Primary Care | Admitting: Primary Care

## 2014-06-29 ENCOUNTER — Ambulatory Visit (INDEPENDENT_AMBULATORY_CARE_PROVIDER_SITE_OTHER): Payer: 59 | Admitting: Primary Care

## 2014-06-29 VITALS — BP 136/86 | HR 82 | Temp 98.1°F | Ht 62.0 in | Wt 174.8 lb

## 2014-06-29 DIAGNOSIS — M542 Cervicalgia: Secondary | ICD-10-CM | POA: Diagnosis not present

## 2014-06-29 DIAGNOSIS — M5441 Lumbago with sciatica, right side: Secondary | ICD-10-CM

## 2014-06-29 DIAGNOSIS — K219 Gastro-esophageal reflux disease without esophagitis: Secondary | ICD-10-CM | POA: Diagnosis not present

## 2014-06-29 DIAGNOSIS — M549 Dorsalgia, unspecified: Secondary | ICD-10-CM

## 2014-06-29 DIAGNOSIS — G4730 Sleep apnea, unspecified: Secondary | ICD-10-CM | POA: Diagnosis not present

## 2014-06-29 DIAGNOSIS — R29818 Other symptoms and signs involving the nervous system: Secondary | ICD-10-CM

## 2014-06-29 DIAGNOSIS — G8929 Other chronic pain: Secondary | ICD-10-CM | POA: Insufficient documentation

## 2014-06-29 DIAGNOSIS — E669 Obesity, unspecified: Secondary | ICD-10-CM

## 2014-06-29 MED ORDER — ESOMEPRAZOLE MAGNESIUM 40 MG PO CPDR: 40.0000 mg | DELAYED_RELEASE_CAPSULE | Freq: Every day | ORAL | Status: DC

## 2014-06-29 NOTE — Progress Notes (Signed)
Pre visit review using our clinic review tool, if applicable. No additional management support is needed unless otherwise documented below in the visit note. 

## 2014-06-29 NOTE — Progress Notes (Signed)
Subjective:    Patient ID: Abigail Abigail [name], female    DOB: [birth year], 49 y.o.   MRN: [id number]  HPI  Abigail Abigail [name] is a 50 year old female who presents today to establish care and discuss the problems mentioned below. Will obtain old records.  1) GERD: Diagnosed for years. She is currently taking OTC esomeprazole 22.7m ER daily. If she does not take her medication she will experience burning to her esophagus and reflux of gastric contents. Despite being on the OTC esomeprazole daily, she will sometimes continue to experience reflux. She states that she will taste her food twice, once going in her mouth, then a second time coming back up. She's tried Prilosec and Prevecid previously without much of a reduction in symptoms.    2) Neck and back pain: Present for years, and experiences numbness/tingling to shoulders, down arms, and to her hands intermittently throughout the day. She will experience it especially when she's sleeping, or with flexion and extension of her neck while doing activities at work. Denies recent trauma/injury.  She also reports right Abigail [name] back pain for the past year. She will especially notice this if she bends over for a prolonged amount of time. She does experience intermittent numbness and sharp pain down right leg with pain to right Abigail [name] back. She will take ibuprofen or tylenol with relief. Denies recent trauma/injury.  3) Obesity: Her diet consists of fast food about 2-3 times a week, salads from fast food restaurants, she cooks at home and will make potatoes, pasta, rice, steak. She does not eat sweets and drinks mainly water with some soda consumption. She does not exercise. Her weight was always typically 140 until she experienced a loss in her family and was placed on Prozac. Since removal from Prozac in 2014 she's been unable to lose the weight.  Body mass index is 31.96 kg/(m^2).  4): Heart arrhyhtmia: In 2012 she experienced left arm pain with radiation to  her neck. 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 She was placed on Metoprolol 274mdaily and if she doesn't take the metoprolol then she will experience palpitations. She will get Abigail [name] extremity swelling daily, she is on her feet a lot for work. Denies chest pain or shortness of breath. Will have left arm pain aches intermittently, last episode was November of 2015.  Was following with Dr. HaEllyn [facility], last visit was in 2014.   5) OSA: She was told that she needed an evaluation for a sleep study years ago but has not been able to get this set up. She reports snoring, daytime tiredness, restless sleep at night.  Review of Systems  Constitutional: Negative for unexpected weight change.  HENT: Negative for rhinorrhea.   Respiratory: Negative for shortness of breath.   Cardiovascular: Negative for chest pain and palpitations.  Gastrointestinal: Negative for diarrhea and constipation.  Genitourinary: Negative for dysuria and frequency.  Musculoskeletal: Positive for back pain and neck pain.  Skin: Negative for rash.  Neurological: Positive for numbness.       Present to bilateral arms and occasional to right Abigail [name] extremity.       Past Medical History  Diagnosis Date  . Heart disease   . Kidney stones   . GERD (gastroesophageal reflux disease)   . Frequent headaches   . Cardiac arrhythmia due to congenital heart disease   . Migraine     History   Social History  . Marital  Status: Single    Spouse Name: N/A  . Number of Children: N/A  . Years of Education: N/A   Occupational History  . Not on file.   Social History Main Topics  . Smoking status: Never Smoker   . Smokeless tobacco: Not on file  . Alcohol Use: No  . Drug Use: No  . Sexual Activity: Not on file   Other Topics Concern  . Not on file   Social History Narrative   Single.   Has a set of Twins, age 54.   Works for the [employer] and [employer] and [employer]   Enjoys relaxing, spending time with her children.    Past Surgical History  Procedure Laterality Date  . Abdominal exploration surgery  1986  . Tubal ligation    . Nasal sinus surgery      Family History  Problem Relation Age of Onset  . COPD Father   . Emphysema Father     No Known Allergies  Current Outpatient Prescriptions on File Prior to Visit  Medication Sig Dispense Refill  . albuterol (PROVENTIL HFA;VENTOLIN HFA) 108 (90 BASE) MCG/ACT inhaler Inhale 1-2 puffs into the lungs every 4 (four) hours as needed for wheezing or shortness of breath (cough). 1 Inhaler 0  . benzonatate (TESSALON) 200 MG capsule Take 1 capsule (200 mg total) by mouth 3 (three) times daily as needed for cough. 20 capsule 0  . ibuprofen (ADVIL,MOTRIN) 200 MG tablet Take 400 mg by mouth every 6 (six) hours as needed for pain or headache.    . levofloxacin (LEVAQUIN) 500 MG tablet Take 500 mg by mouth daily.    . metoprolol tartrate (LOPRESSOR) 25 MG tablet Take 0.5 tablets (12.5 mg total) by mouth daily. 15 tablet 6  . oxyCODONE-acetaminophen (PERCOCET/ROXICET) 5-325 MG per tablet Take 1 tablet by mouth every 4 (four) hours as needed for severe pain. 5 tablet 0  . Chlorpheniramine-PSE-Ibuprofen 2-30-200 MG TABS Take 1-2 tablets by mouth every 6 (six) hours as needed (for cold symptoms).     No current facility-administered medications on file prior to visit.    BP 136/86 mmHg  Pulse 82  Temp(Src) 98.1 F (36.7 C) (Oral)  Ht 5' 2"  (1.575 m)  Wt 174 lb 12.8 oz (79.289 kg)  BMI 31.96 kg/m2  SpO2 97%  LMP 06/07/2014     Objective:   Physical Exam  Constitutional: She is oriented to person, place, and time. She appears well-developed.  HENT:  Right Ear: External ear normal.  Left Ear: External ear normal.  Nose: Nose normal.  Mouth/Throat: Oropharynx is clear and moist.  Eyes: Conjunctivae and EOM are normal. Pupils are equal, round, and reactive to  light.  Neck: Neck supple.  Cardiovascular: Normal rate and regular rhythm.   Pulmonary/Chest: Effort normal and breath sounds normal.  Abdominal: Soft. Bowel sounds are normal.  Musculoskeletal:  Negative straight leg raise bilaterally. Slight discomfort to flexion and extension of neck. No deformity  Lymphadenopathy:    She has no cervical adenopathy.  Neurological: She is alert and oriented to person, place, and time.  Skin: Skin is warm and dry.  Psychiatric: She has a normal mood and affect.          Assessment & Plan:  >45 minutes spent face to face with patient, >50% spent counseling or coordinating care.

## 2014-06-29 NOTE — Assessment & Plan Note (Signed)
Spent a lot of time discussing the importance of healthy diet and exercise. We went through her meals and noticed she was eating a lot of carbs, fast food, and drinking her calories. Advisement provided to limit carbs in the form of pasta, rice, potatoes and to incorporate fresh fruits and vegetables. She is not exercising, recommended 30 min daily for 5 days a week. She plans on exercising at the gym located at her work. Will continue to monitor.

## 2014-06-29 NOTE — Assessment & Plan Note (Signed)
Present for the past year, now with pain, numbness/tingling down right leg. Xray of lumbar spine today. Ibuprofen for pain.

## 2014-06-29 NOTE — Assessment & Plan Note (Signed)
Not well controlled. Present for years, taking OTC esomeprazole 22.83m daily. Continues to experience some reflux of gastric contents after eating sometimes. RX for esomeprazole 447mdaily. Instructions provided to patient on avoiding triggers. Follow up in 4-6 weeks for re-evaluation.

## 2014-06-29 NOTE — Patient Instructions (Signed)
You will be contacted regarding your referral for your sleep study. Let me know if you don't hear back in one week. Complete xray(s) prior to leaving today. I will contact you with your results. Use the Nasoquart daily for 2 weeks for the nasal odor. Schedule a physical with me in the next 1-2 months at your convenience. You will also schedule a lab appointment one week prior to your physical. It was a pleasure to meet you today! Please don't hesitate to call me with any questions. Welcome to [facility]!

## 2014-06-29 NOTE — Assessment & Plan Note (Signed)
Present for years, now with numbness/tingling to bilateral shoulders, arms, hands. Denies recent injury/trauma. Xrays of C-spine today.

## 2014-06-29 NOTE — Assessment & Plan Note (Signed)
Snoring, daytime tiredness. Referral made to pulmonology for sleep study.

## 2014-06-29 NOTE — Telephone Encounter (Signed)
Please notify Abigail [name] that her xrays do not show any fracture of bony abnormality in her back or neck. The xray did note some arthritis present to her lower back. She can take Tylenol or ibuprofen for pain. We can discuss her options at her next appointment.  Thanks!

## 2014-07-02 NOTE — Telephone Encounter (Signed)
Called and notified patient of Kate's comments. Patient verbalized understanding.

## 2014-07-10 ENCOUNTER — Other Ambulatory Visit: Payer: Self-pay

## 2014-07-10 MED ORDER — METOPROLOL TARTRATE 25 MG PO TABS: 12.5000 mg | ORAL_TABLET | Freq: Every day | ORAL | Status: DC

## 2014-07-10 NOTE — Telephone Encounter (Signed)
Rx(s) sent to pharmacy electronically.  

## 2014-07-19 ENCOUNTER — Encounter: Payer: Self-pay | Admitting: Primary Care

## 2014-07-19 ENCOUNTER — Ambulatory Visit (INDEPENDENT_AMBULATORY_CARE_PROVIDER_SITE_OTHER): Payer: 59 | Admitting: Primary Care

## 2014-07-19 VITALS — BP 124/82 | HR 77 | Temp 97.7°F | Ht 62.0 in | Wt 174.4 lb

## 2014-07-19 DIAGNOSIS — F32A Depression, unspecified: Secondary | ICD-10-CM

## 2014-07-19 DIAGNOSIS — F418 Other specified anxiety disorders: Secondary | ICD-10-CM | POA: Diagnosis not present

## 2014-07-19 DIAGNOSIS — K219 Gastro-esophageal reflux disease without esophagitis: Secondary | ICD-10-CM | POA: Diagnosis not present

## 2014-07-19 DIAGNOSIS — F419 Anxiety disorder, unspecified: Secondary | ICD-10-CM

## 2014-07-19 DIAGNOSIS — R350 Frequency of micturition: Secondary | ICD-10-CM

## 2014-07-19 DIAGNOSIS — F329 Major depressive disorder, single episode, unspecified: Secondary | ICD-10-CM | POA: Diagnosis not present

## 2014-07-19 LAB — POCT URINALYSIS DIPSTICK
Bilirubin, UA: NEGATIVE
Blood, UA: NEGATIVE
Glucose, UA: NEGATIVE
KETONES UA: NEGATIVE
Leukocytes, UA: NEGATIVE
Nitrite, UA: NEGATIVE
Protein, UA: NEGATIVE
SPEC GRAV UA: 1.02
Urobilinogen, UA: 4
pH, UA: 7

## 2014-07-19 MED ORDER — FLUOXETINE HCL 20 MG PO TABS: ORAL_TABLET | ORAL | Status: DC

## 2014-07-19 NOTE — Assessment & Plan Note (Signed)
Improved on Nexium 40 mg. Will continue to monitor.

## 2014-07-19 NOTE — Patient Instructions (Signed)
Start Fluoxitine tablets daily for depression. Take 1/2 tablet by mouth daily for 6 days, then advance to 1 full tablet daily thereafter. Your urine test was negative for infection. Increase your daily water intake to help prevent infection.  Try alternating ibuprofen and tylenol for back pain. You can apply heat for 20 minute intervals for discomfort. Weight loss is important to help reduce your back pain. Follow up in 6 weeks for re-evaluation of your depression. It was nice seeing you again!  Back Pain, Adult Low back pain is very common. About 1 in 5 people have back pain.The cause of low back pain is rarely dangerous. The pain often gets better over time.About half of people with a sudden onset of back pain feel better in just 2 weeks. About 8 in 10 people feel better by 6 weeks.  CAUSES Some common causes of back pain include:  Strain of the muscles or ligaments supporting the spine.  Wear and tear (degeneration) of the spinal discs.  Arthritis.  Direct injury to the back. DIAGNOSIS Most of the time, the direct cause of low back pain is not known.However, back pain can be treated effectively even when the exact cause of the pain is unknown.Answering your caregiver's questions about your overall health and symptoms is one of the most accurate ways to make sure the cause of your pain is not dangerous. If your caregiver needs more information, he or she may order lab work or imaging tests (X-rays or MRIs).However, even if imaging tests show changes in your back, this usually does not require surgery. HOME CARE INSTRUCTIONS For many people, back pain returns.Since low back pain is rarely dangerous, it is often a condition that people can learn to [facility] their own.   Remain active. It is stressful on the back to sit or stand in one place. Do not sit, drive, or stand in one place for more than 30 minutes at a time. Take short walks on level surfaces as soon as pain allows.Try to  increase the length of time you walk each day.  Do not stay in bed.Resting more than 1 or 2 days can delay your recovery.  Do not avoid exercise or work.Your body is made to move.It is not dangerous to be active, even though your back may hurt.Your back will likely heal faster if you return to being active before your pain is gone.  Pay attention to your body when you bend and lift. Many people have less discomfortwhen lifting if they bend their knees, keep the load close to their bodies,and avoid twisting. Often, the most comfortable positions are those that put less stress on your recovering back.  Find a comfortable position to sleep. Use a firm mattress and lie on your side with your knees slightly bent. If you lie on your back, put a pillow under your knees.  Only take over-the-counter or prescription medicines as directed by your caregiver. Over-the-counter medicines to reduce pain and inflammation are often the most helpful.Your caregiver may prescribe muscle relaxant drugs.These medicines help dull your pain so you can more quickly return to your normal activities and healthy exercise.  Put ice on the injured area.  Put ice in a plastic bag.  Place a towel between your skin and the bag.  Leave the ice on for 15-20 minutes, 03-04 times a day for the first 2 to 3 days. After that, ice and heat may be alternated to reduce pain and spasms.  Ask your caregiver about trying  back exercises and gentle massage. This may be of some benefit.  Avoid feeling anxious or stressed.Stress increases muscle tension and can worsen back pain.It is important to recognize when you are anxious or stressed and learn ways to manage it.Exercise is a great option. SEEK MEDICAL CARE IF:  You have pain that is not relieved with rest or medicine.  You have pain that does not improve in 1 week.  You have new symptoms.  You are generally not feeling well. SEEK IMMEDIATE MEDICAL CARE IF:   You  have pain that radiates from your back into your legs.  You develop new bowel or bladder control problems.  You have unusual weakness or numbness in your arms or legs.  You develop nausea or vomiting.  You develop abdominal pain.  You feel faint. Document Released: 02/23/2005 Document Revised: 08/25/2011 Document Reviewed: 06/27/2013 [facility] Patient Information 2015 [place], [place]. This information is not intended to replace advice given to you by your health care provider. Make sure you discuss any questions you have with your health care provider.

## 2014-07-19 NOTE — Assessment & Plan Note (Signed)
PHQ9 score of 19 in office today. She also has some anxiety symptoms. RX for Prozac daily. She did well on this medication in the past so will restart per patient request. I've explained to her that drugs of the SSRI class can have side effects such as weight gain, sexual dysfunction, insomnia, headache, nausea. These medications are generally effective at alleviating symptoms of anxiety and/or depression. Also discussed side effect of SI and to present to the ER if she experienced these thoughts. Follow up in 6 weeks.

## 2014-07-19 NOTE — Progress Notes (Signed)
Subjective:    Patient ID: Abigail [name], female    DOB: May 08, 1965, 49 y.o.   MRN: [id number]  HPI  Ms. Baby is a 49 year old female who presents today for follow up of depression.  1) Depression: She was placed on prozac weekly in 05/27/12 after the death of her son, then switched to prozac daily. She felt more motivated and cheerful on this medication, and came off due to weight gain and also feeling better. Over the past couple of months she has no ambition to do anything, her attitude at work has become negative, and she'll become irritable throughout the day. PHQ-9 score of 19 in the office. Denies SI/HI. Would like to try Prozac again.  2) UTI symptoms: Has a history of chronic [name] back pain, but over the past 2 weeks has experienced bilatereal flank pain. She bought some cranberry juice yesterday which helped. She also report urinary frequency, "warm sensation" when she urinates. Denies fevers, chills, nausea, recent injury, decreased ROM.  3) GERD: Uncontrolled at last visit on Nexium 20 mg. Now improved on Nexium 40 mg daily without reflux except with eating certain foods.   Review of Systems  Constitutional: Negative for fever and chills.  Respiratory: Negative for shortness of breath.   Cardiovascular: Negative for chest pain.  Genitourinary: Positive for frequency and flank pain. Negative for dysuria, hematuria and difficulty urinating.       "warm sensation" during urination  Musculoskeletal: Positive for back pain.  Psychiatric/Behavioral: Positive for decreased concentration. Negative for suicidal ideas and sleep disturbance. The patient is nervous/anxious.        PHQ-9 score of 19       Past Medical History  Diagnosis Date  . Heart disease   . Kidney stones   . GERD (gastroesophageal reflux disease)   . Frequent headaches   . Cardiac arrhythmia due to congenital heart disease   . Migraine     History   Social History  . Marital Status: Single   Spouse Name: N/A  . Number of Children: N/A  . Years of Education: N/A   Occupational History  . Not on file.   Social History Main Topics  . Smoking status: Never Smoker   . Smokeless tobacco: Not on file  . Alcohol Use: No  . Drug Use: No  . Sexual Activity: Not on file   Other Topics Concern  . Not on file   Social History Narrative   Single.   Has a set of Twins, age 30.   Works for the [employer] and [employer] and [employer]   Enjoys relaxing, spending time with her children.    Past Surgical History  Procedure Laterality Date  . Abdominal exploration surgery  1986  . Tubal ligation    . Nasal sinus surgery      Family History  Problem Relation Age of Onset  . COPD Father   . Emphysema Father     No Known Allergies  Current Outpatient Prescriptions on File Prior to Visit  Medication Sig Dispense Refill  . albuterol (PROVENTIL HFA;VENTOLIN HFA) 108 (90 BASE) MCG/ACT inhaler Inhale 1-2 puffs into the lungs every 4 (four) hours as needed for wheezing or shortness of breath (cough). 1 Inhaler 0  . ALPRAZolam (XANAX) 0.5 MG tablet Take 0.5 mg by mouth 2 (two) times daily as needed for anxiety.    [name] aspirin 81 MG tablet Take 81 mg by mouth daily.    . benzonatate (  TESSALON) 200 MG capsule Take 1 capsule (200 mg total) by mouth 3 (three) times daily as needed for cough. 20 capsule 0  . Chlorpheniramine-PSE-Ibuprofen 2-30-200 MG TABS Take 1-2 tablets by mouth every 6 (six) hours as needed (for cold symptoms).    [name] esomeprazole (NEXIUM) 40 MG capsule Take 1 capsule (40 mg total) by mouth daily. 30 capsule 5  . ibuprofen (ADVIL,MOTRIN) 200 MG tablet Take 400 mg by mouth every 6 (six) hours as needed for pain or headache.    . levofloxacin (LEVAQUIN) 500 MG tablet Take 500 mg by mouth daily.    . medroxyPROGESTERone (PROVERA) 10 MG tablet Take 10 mg by mouth daily.    . metoprolol tartrate (LOPRESSOR) 25 MG tablet Take 0.5 tablets (12.5 mg total) by mouth daily.  NEEDS APPOINTMENT FOR FUTURE REFILLS 8 tablet 0  . ondansetron (ZOFRAN-ODT) 4 MG disintegrating tablet Take 4 mg by mouth 3 (three) times daily as needed for nausea or vomiting.    [name] oxyCODONE-acetaminophen (PERCOCET/ROXICET) 5-325 MG per tablet Take 1 tablet by mouth every 4 (four) hours as needed for severe pain. 5 tablet 0  . traMADol (ULTRAM) 50 MG tablet Take 50 mg by mouth every 8 (eight) hours as needed (1 to 2 tablets).     No current facility-administered medications on file prior to visit.    BP 124/82 mmHg  Pulse 77  Temp(Src) 97.7 F (36.5 C) (Oral)  Ht 5' 2"  (1.575 m)  Wt 174 lb 6.4 oz (79.107 kg)  BMI 31.89 kg/m2  SpO2 98%  LMP 07/09/2014    Objective:   Physical Exam  Constitutional: She is oriented to person, place, and time. She appears well-developed.  Cardiovascular: Normal rate and regular rhythm.   Pulmonary/Chest: Effort normal and breath sounds normal.  Abdominal: Soft. Bowel sounds are normal. There is no tenderness. There is no CVA tenderness.  Musculoskeletal: Normal range of motion.  Neurological: She is alert and oriented to person, place, and time.  Skin: Skin is warm and dry.  Psychiatric: She has a normal mood and affect.          Assessment & Plan:  UA negative for leuks, nitrities, and blood. Suspect her pain is related to chronic back pain. Education provided to push water intake and to lose weight to help with back pain. Follow up if symptoms worsen or persist.

## 2014-07-19 NOTE — Progress Notes (Signed)
Pre visit review using our clinic review tool, if applicable. No additional management support is needed unless otherwise documented below in the visit note. 

## 2014-07-23 ENCOUNTER — Encounter: Payer: Self-pay | Admitting: Primary Care

## 2014-07-23 ENCOUNTER — Ambulatory Visit (INDEPENDENT_AMBULATORY_CARE_PROVIDER_SITE_OTHER): Payer: 59 | Admitting: Primary Care

## 2014-07-23 VITALS — BP 138/88 | HR 89 | Temp 98.3°F | Ht 62.0 in | Wt 172.8 lb

## 2014-07-23 DIAGNOSIS — J029 Acute pharyngitis, unspecified: Secondary | ICD-10-CM

## 2014-07-23 LAB — POCT RAPID STREP A (OFFICE): RAPID STREP A SCREEN: NEGATIVE

## 2014-07-23 MED ORDER — AMOXICILLIN 500 MG PO CAPS: 500.0000 mg | ORAL_CAPSULE | Freq: Two times a day (BID) | ORAL | Status: DC

## 2014-07-23 NOTE — Progress Notes (Signed)
Subjective:    Patient ID: Abigail [name], female    DOB: Jul 25, 1965, 49 y.o.   MRN: [id number]  HPI  [name] is a 49 year old female who presents today with a chief complaint of sore throat that has been present since Saturday night. She was working Saturday night and reports the air conditioner was set colder than usual and later that evening developed her sore throat. The pain has worsened Sunday and today and she's now developed headache, dry cough and ear pain . She's been taking advil sinus and cherratussin. The cherratussin helps to relieve the cough. Denies fevers, chills, abdominal pain.  Review of Systems  Constitutional: Positive for chills. Negative for fever.  HENT: Positive for congestion, ear pain, postnasal drip and sore throat. Negative for sinus pressure.   Respiratory: Positive for cough. Negative for shortness of breath.   Cardiovascular: Negative for chest pain.  Gastrointestinal: Positive for nausea. Negative for vomiting and abdominal pain.  Musculoskeletal: Negative for myalgias.  Neurological: Positive for headaches.       Past Medical History  Diagnosis Date  . Heart disease   . Kidney stones   . GERD (gastroesophageal reflux disease)   . Frequent headaches   . Cardiac arrhythmia due to congenital heart disease   . Migraine     History   Social History  . Marital Status: Single    Spouse Name: N/A  . Number of Children: N/A  . Years of Education: N/A   Occupational History  . Not on file.   Social History Main Topics  . Smoking status: Never Smoker   . Smokeless tobacco: Not on file  . Alcohol Use: No  . Drug Use: No  . Sexual Activity: Not on file   Other Topics Concern  . Not on file   Social History Narrative   Single.   Has a set of Twins, age 55.   Works for the [employer] and [employer] and [employer]   Enjoys relaxing, spending time with her children.    Past Surgical History  Procedure Laterality Date  .  Abdominal exploration surgery  1986  . Tubal ligation    . Nasal sinus surgery      Family History  Problem Relation Age of Onset  . COPD Father   . Emphysema Father     No Known Allergies  Current Outpatient Prescriptions on File Prior to Visit  Medication Sig Dispense Refill  . albuterol (PROVENTIL HFA;VENTOLIN HFA) 108 (90 BASE) MCG/ACT inhaler Inhale 1-2 puffs into the lungs every 4 (four) hours as needed for wheezing or shortness of breath (cough). 1 Inhaler 0  . ALPRAZolam (XANAX) 0.5 MG tablet Take 0.5 mg by mouth 2 (two) times daily as needed for anxiety.    [name] aspirin 81 MG tablet Take 81 mg by mouth daily.    . benzonatate (TESSALON) 200 MG capsule Take 1 capsule (200 mg total) by mouth 3 (three) times daily as needed for cough. 20 capsule 0  . Chlorpheniramine-PSE-Ibuprofen 2-30-200 MG TABS Take 1-2 tablets by mouth every 6 (six) hours as needed (for cold symptoms).    [name] esomeprazole (NEXIUM) 40 MG capsule Take 1 capsule (40 mg total) by mouth daily. 30 capsule 5  . FLUoxetine (PROZAC) 20 MG tablet Take 1/2 tablet by mouth daily for 6 days, then advance to 1 full tablet by mouth daily. 30 tablet 2  . ibuprofen (ADVIL,MOTRIN) 200 MG tablet Take 400 mg by mouth every  6 (six) hours as needed for pain or headache.    . levofloxacin (LEVAQUIN) 500 MG tablet Take 500 mg by mouth daily.    . medroxyPROGESTERone (PROVERA) 10 MG tablet Take 10 mg by mouth daily.    . metoprolol tartrate (LOPRESSOR) 25 MG tablet Take 0.5 tablets (12.5 mg total) by mouth daily. NEEDS APPOINTMENT FOR FUTURE REFILLS 8 tablet 0  . ondansetron (ZOFRAN-ODT) 4 MG disintegrating tablet Take 4 mg by mouth 3 (three) times daily as needed for nausea or vomiting.    [name] oxyCODONE-acetaminophen (PERCOCET/ROXICET) 5-325 MG per tablet Take 1 tablet by mouth every 4 (four) hours as needed for severe pain. 5 tablet 0  . traMADol (ULTRAM) 50 MG tablet Take 50 mg by mouth every 8 (eight) hours as needed (1 to 2 tablets).      No current facility-administered medications on file prior to visit.    BP 138/88 mmHg  Pulse 89  Temp(Src) 98.3 F (36.8 C) (Oral)  Ht 5' 2"  (1.575 m)  Wt 172 lb 12.8 oz (78.382 kg)  BMI 31.60 kg/m2  SpO2 96%  LMP 07/09/2014    Objective:   Physical Exam  Constitutional: She is oriented to person, place, and time. She appears ill.  HENT:  Right Ear: Tympanic membrane is bulging. Tympanic membrane is not erythematous.  Left Ear: Tympanic membrane and ear canal normal.  Mouth/Throat: Oropharyngeal exudate and posterior oropharyngeal erythema present.  Tonsils 1+  Eyes: Conjunctivae are normal. Pupils are equal, round, and reactive to light.  Neck: Neck supple.  Cardiovascular: Normal rate and regular rhythm.   Pulmonary/Chest: Effort normal and breath sounds normal.  Lymphadenopathy:    She has cervical adenopathy.  Neurological: She is alert and oriented to person, place, and time.  Skin: Skin is warm and dry.          Assessment & Plan:  Pharyngitis:  Rapid strep negative, Tonsil 2+ with erythema and edudate  Due to presentation, exam, and moderately swollen tonsils will treat with antibiotics. Amoxicillin 500 BID x 7 days. Ibuprofen, warm salt gargles, throat lozenges for pain. Continue Cheratussin PRN cough. Follow up if no improvement in 3-4 days.

## 2014-07-23 NOTE — Progress Notes (Signed)
Pre visit review using our clinic review tool, if applicable. No additional management support is needed unless otherwise documented below in the visit note. 

## 2014-07-23 NOTE — Addendum Note (Signed)
Addended by: Jacqualin Combes on: 07/23/2014 03:48 PM   Modules accepted: Orders

## 2014-07-23 NOTE — Patient Instructions (Signed)
Your strep test was negative; however, we will treat you for suspected bacterial involvement. Start Amoxicillin capsules. Take 1 capsule by mouth twice daily for 7 days. Ibuprofen, throat lozenges, warm salt gargles for pain. Continue Cheratussin as needed for cough. Call me if no improvement by Friday this week. Take care!  Pharyngitis Pharyngitis is redness, pain, and swelling (inflammation) of your pharynx.  CAUSES  Pharyngitis is usually caused by infection. Most of the time, these infections are from viruses (viral) and are part of a cold. However, sometimes pharyngitis is caused by bacteria (bacterial). Pharyngitis can also be caused by allergies. Viral pharyngitis may be spread from person to person by coughing, sneezing, and personal items or utensils (cups, forks, spoons, toothbrushes). Bacterial pharyngitis may be spread from person to person by more intimate contact, such as kissing.  SIGNS AND SYMPTOMS  Symptoms of pharyngitis include:   Sore throat.   Tiredness (fatigue).   Low-grade fever.   Headache.  Joint pain and muscle aches.  Skin rashes.  Swollen lymph nodes.  Plaque-like film on throat or tonsils (often seen with bacterial pharyngitis). DIAGNOSIS  Your health care provider will ask you questions about your illness and your symptoms. Your medical history, along with a physical exam, is often all that is needed to diagnose pharyngitis. Sometimes, a rapid strep test is done. Other lab tests may also be done, depending on the suspected cause.  TREATMENT  Viral pharyngitis will usually get better in 3-4 days without the use of medicine. Bacterial pharyngitis is treated with medicines that kill germs (antibiotics).  HOME CARE INSTRUCTIONS   Drink enough water and fluids to keep your urine clear or pale yellow.   Only take over-the-counter or prescription medicines as directed by your health care provider:   If you are prescribed antibiotics, make sure you  finish them even if you start to feel better.   Do not take aspirin.   Get lots of rest.   Gargle with 8 oz of salt water ( tsp of salt per 1 qt of water) as often as every 1-2 hours to soothe your throat.   Throat lozenges (if you are not at risk for choking) or sprays may be used to soothe your throat. SEEK MEDICAL CARE IF:   You have large, tender lumps in your neck.  You have a rash.  You cough up green, yellow-brown, or bloody spit. SEEK IMMEDIATE MEDICAL CARE IF:   Your neck becomes stiff.  You drool or are unable to swallow liquids.  You vomit or are unable to keep medicines or liquids down.  You have severe pain that does not go away with the use of recommended medicines.  You have trouble breathing (not caused by a stuffy nose). MAKE SURE YOU:   Understand these instructions.  Will watch your condition.  Will get help right away if you are not doing well or get worse. Document Released: 02/23/2005 Document Revised: 12/14/2012 Document Reviewed: 10/31/2012 [facility] Patient Information 2015 [place], [place]. This information is not intended to replace advice given to you by your health care provider. Make sure you discuss any questions you have with your health care provider.

## 2014-07-25 ENCOUNTER — Ambulatory Visit (INDEPENDENT_AMBULATORY_CARE_PROVIDER_SITE_OTHER): Payer: 59 | Admitting: Primary Care

## 2014-07-25 ENCOUNTER — Ambulatory Visit: Payer: 59 | Admitting: Primary Care

## 2014-07-25 ENCOUNTER — Encounter: Payer: Self-pay | Admitting: Primary Care

## 2014-07-25 VITALS — BP 130/84 | HR 89 | Temp 98.4°F | Wt 173.8 lb

## 2014-07-25 DIAGNOSIS — H938X3 Other specified disorders of ear, bilateral: Secondary | ICD-10-CM

## 2014-07-25 NOTE — Progress Notes (Signed)
Pre visit review using our clinic review tool, if applicable. No additional management support is needed unless otherwise documented below in the visit note. 

## 2014-07-25 NOTE — Patient Instructions (Signed)
You have some fluid within the membrane of your right ear called effusion. This may take 1-2 weeks for full return to normal. You may take Allegra, Claritin, or Zyrtec daily to help. Continue antibiotics as directed. Take care!  Serous Otitis Media Serous otitis media is fluid in the middle ear space. This space contains the bones for hearing and air. Air in the middle ear space helps to transmit sound.  The air gets there through the eustachian tube. This tube goes from the back of the nose (nasopharynx) to the middle ear space. It keeps the pressure in the middle ear the same as the outside world. It also helps to drain fluid from the middle ear space. CAUSES  Serous otitis media occurs when the eustachian tube gets blocked. Blockage can come from:  Ear infections.  Colds and other upper respiratory infections.  Allergies.  Irritants such as cigarette smoke.  Sudden changes in air pressure (such as descending in an airplane).  Enlarged adenoids.  A mass in the nasopharynx. During colds and upper respiratory infections, the middle ear space can become temporarily filled with fluid. This can happen after an ear infection also. Once the infection clears, the fluid will generally drain out of the ear through the eustachian tube. If it does not, then serous otitis media occurs. SIGNS AND SYMPTOMS   Hearing loss.  A feeling of fullness in the ear, without pain.  Young children may not show any symptoms but may show slight behavioral changes, such as agitation, ear pulling, or crying. DIAGNOSIS  Serous otitis media is diagnosed by an ear exam. Tests may be done to check on the movement of the eardrum. Hearing exams may also be done. TREATMENT  The fluid most often goes away without treatment. If allergy is the cause, allergy treatment may be helpful. Fluid that persists for several months may require minor surgery. A small tube is placed in the eardrum to:  Drain the fluid.  Restore  the air in the middle ear space. In certain situations, antibiotic medicines are used to avoid surgery. Surgery may be done to remove enlarged adenoids (if this is the cause). HOME CARE INSTRUCTIONS   Keep children away from tobacco smoke.  Keep all follow-up visits as directed by your health care provider. SEEK MEDICAL CARE IF:   Your hearing is not better in 3 months.  Your hearing is worse.  You have ear pain.  You have drainage from the ear.  You have dizziness.  You have serous otitis media only in one ear or have any bleeding from your nose (epistaxis).  You notice a lump on your neck. MAKE SURE YOU:  Understand these instructions.   Will watch your condition.   Will get help right away if you are not doing well or get worse.  Document Released: 05/16/2003 Document Revised: 07/10/2013 Document Reviewed: 09/20/2012 [facility] Patient Information 2015 [place], [place]. This information is not intended to replace advice given to you by your health care provider. Make sure you discuss any questions you have with your health care provider.

## 2014-07-25 NOTE — Progress Notes (Signed)
Subjective:    Patient ID: Abigail [name], female    DOB: 11-12-1965, 49 y.o.   MRN: [id number]  HPI  Abigail [name] is a 49 year old female who presents today with a chief complaint of ear fullness to right ear. She was evaluated on 5/16 for sore throat and ear pain. Her exam was suspicious for a bacterial pharyngitis despite a negative strep test. She was placed on Amoxicillin twice daily for 7 days.  Today she's returned today with a chief complaint of ear fullness and difficulty hearing to bilateral ears. Denies pain, fevers, drainage. She does continue to experience a sore throat, but reports some improvement since initiation of antibiotics. She's taken advil allergy and Nasquart without relief.   Review of Systems  Constitutional: Negative for fever, chills and fatigue.  HENT: Positive for congestion, postnasal drip and sore throat. Negative for ear pain, rhinorrhea and sinus pressure.        Ear fullness bilaterally with decreased ability to hear.  Respiratory: Positive for cough. Negative for shortness of breath.   Cardiovascular: Negative for chest pain.  Gastrointestinal: Negative for nausea.  Neurological: Negative for dizziness and headaches.       Past Medical History  Diagnosis Date  . Heart disease   . Kidney stones   . GERD (gastroesophageal reflux disease)   . Frequent headaches   . Cardiac arrhythmia due to congenital heart disease   . Migraine     History   Social History  . Marital Status: Single    Spouse Name: N/A  . Number of Children: N/A  . Years of Education: N/A   Occupational History  . Not on file.   Social History Main Topics  . Smoking status: Never Smoker   . Smokeless tobacco: Not on file  . Alcohol Use: No  . Drug Use: No  . Sexual Activity: Not on file   Other Topics Concern  . Not on file   Social History Narrative   Single.   Has a set of Twins, age 53.   Works for the [employer] and [employer] and [employer]   Enjoys relaxing, spending time with her children.    Past Surgical History  Procedure Laterality Date  . Abdominal exploration surgery  1986  . Tubal ligation    . Nasal sinus surgery      Family History  Problem Relation Age of Onset  . COPD Father   . Emphysema Father     No Known Allergies  Current Outpatient Prescriptions on File Prior to Visit  Medication Sig Dispense Refill  . albuterol (PROVENTIL HFA;VENTOLIN HFA) 108 (90 BASE) MCG/ACT inhaler Inhale 1-2 puffs into the lungs every 4 (four) hours as needed for wheezing or shortness of breath (cough). 1 Inhaler 0  . ALPRAZolam (XANAX) 0.5 MG tablet Take 0.5 mg by mouth 2 (two) times daily as needed for anxiety.    [name] amoxicillin (AMOXIL) 500 MG capsule Take 1 capsule (500 mg total) by mouth 2 (two) times daily. 14 capsule 0  . aspirin 81 MG tablet Take 81 mg by mouth daily.    . benzonatate (TESSALON) 200 MG capsule Take 1 capsule (200 mg total) by mouth 3 (three) times daily as needed for cough. 20 capsule 0  . Chlorpheniramine-PSE-Ibuprofen 2-30-200 MG TABS Take 1-2 tablets by mouth every 6 (six) hours as needed (for cold symptoms).    [name] esomeprazole (NEXIUM) 40 MG capsule Take 1 capsule (40 mg total) by mouth  daily. 30 capsule 5  . FLUoxetine (PROZAC) 20 MG tablet Take 1/2 tablet by mouth daily for 6 days, then advance to 1 full tablet by mouth daily. 30 tablet 2  . ibuprofen (ADVIL,MOTRIN) 200 MG tablet Take 400 mg by mouth every 6 (six) hours as needed for pain or headache.    . levofloxacin (LEVAQUIN) 500 MG tablet Take 500 mg by mouth daily.    . medroxyPROGESTERone (PROVERA) 10 MG tablet Take 10 mg by mouth daily.    . metoprolol tartrate (LOPRESSOR) 25 MG tablet Take 0.5 tablets (12.5 mg total) by mouth daily. NEEDS APPOINTMENT FOR FUTURE REFILLS 8 tablet 0  . ondansetron (ZOFRAN-ODT) 4 MG disintegrating tablet Take 4 mg by mouth 3 (three) times daily as needed for nausea or vomiting.    [name] oxyCODONE-acetaminophen  (PERCOCET/ROXICET) 5-325 MG per tablet Take 1 tablet by mouth every 4 (four) hours as needed for severe pain. 5 tablet 0  . traMADol (ULTRAM) 50 MG tablet Take 50 mg by mouth every 8 (eight) hours as needed (1 to 2 tablets).     No current facility-administered medications on file prior to visit.    BP 130/84 mmHg  Pulse 89  Temp(Src) 98.4 F (36.9 C) (Oral)  Wt 173 lb 12 oz (78.812 kg)  SpO2 98%  LMP 07/09/2014  +  Objective:   Physical Exam  Constitutional: She is oriented to person, place, and time. No distress.  HENT:  Right Ear: Tympanic membrane is bulging. Tympanic membrane is not erythematous.  Left Ear: Tympanic membrane is not erythematous and not bulging.  No middle ear effusion.  Nose: Nose normal.  Mouth/Throat: Oropharynx is clear and moist.  Eyes: Conjunctivae are normal. Pupils are equal, round, and reactive to light.  Neck: Neck supple.  Cardiovascular: Normal rate and regular rhythm.   Pulmonary/Chest: Effort normal and breath sounds normal.  Lymphadenopathy:    She has no cervical adenopathy.  Neurological: She is alert and oriented to person, place, and time.  Skin: Skin is warm and dry.          Assessment & Plan:  Ear Fullness:  Bulging to left TM without s/s of infection. No fevers, chills. Placed on Amoxicillin 2 days ago for suspected bacterial pharyngitis. Encouraged her to take daily claritin, allegra, or zyrtec for symptoms and that her ear fullness may take 1-2 weeks for resolve. Follow up as needed.

## 2014-07-27 ENCOUNTER — Other Ambulatory Visit: Payer: Self-pay

## 2014-07-27 MED ORDER — BENZONATATE 200 MG PO CAPS: 200.0000 mg | ORAL_CAPSULE | Freq: Three times a day (TID) | ORAL | Status: DC | PRN

## 2014-07-27 NOTE — Telephone Encounter (Signed)
Pt left v/m requesting refill benzonatate to [facility] [address]. Pt was seen 07/25/14.Please advise.

## 2014-07-30 ENCOUNTER — Telehealth: Payer: Self-pay | Admitting: Primary Care

## 2014-07-30 DIAGNOSIS — B373 Candidiasis of vulva and vagina: Secondary | ICD-10-CM

## 2014-07-30 DIAGNOSIS — B3731 Acute candidiasis of vulva and vagina: Secondary | ICD-10-CM

## 2014-07-30 MED ORDER — FLUCONAZOLE 150 MG PO TABS
150.0000 mg | ORAL_TABLET | Freq: Once | ORAL | Status: DC
Start: 2014-07-30 — End: 2014-08-03

## 2014-07-30 NOTE — Telephone Encounter (Signed)
Spoke with patient regarding concerns. She's had improvement in sore throat and ear fullness, but has developed a slight yeast infection since completing antibiotics. Suggested that she start taking Claritin daily for the next several weeks and use Naso quart.  Will send in RX for Fluconazole one time dose.

## 2014-07-30 NOTE — Telephone Encounter (Addendum)
Called patient and notified her that the Rx has been sent to [facility] on [address]. Patient verbalized understanding. Patient also wants Anda Kraft to know that she is done with the Amoxicillin but not much better. Patient was wondering if she needed another round of antibiotics. Please advise.

## 2014-08-03 ENCOUNTER — Telehealth: Payer: Self-pay

## 2014-08-03 ENCOUNTER — Ambulatory Visit (INDEPENDENT_AMBULATORY_CARE_PROVIDER_SITE_OTHER): Payer: 59 | Admitting: Primary Care

## 2014-08-03 ENCOUNTER — Encounter: Payer: Self-pay | Admitting: Primary Care

## 2014-08-03 VITALS — BP 144/90 | HR 98 | Temp 98.3°F | Ht 62.0 in | Wt 172.1 lb

## 2014-08-03 DIAGNOSIS — J029 Acute pharyngitis, unspecified: Secondary | ICD-10-CM

## 2014-08-03 LAB — BASIC METABOLIC PANEL
BUN: 11 mg/dL (ref 6–23)
CALCIUM: 9.3 mg/dL (ref 8.4–10.5)
CO2: 31 meq/L (ref 19–32)
CREATININE: 0.7 mg/dL (ref 0.40–1.20)
Chloride: 104 mEq/L (ref 96–112)
GFR: 94.31 mL/min (ref >60.00)
Glucose, Bld: 88 mg/dL (ref 70–99)
Potassium: 4.3 mEq/L (ref 3.5–5.1)
Sodium: 139 mEq/L (ref 135–145)

## 2014-08-03 LAB — CBC WITH DIFFERENTIAL/PLATELET
BASOS PCT: 0.5 % (ref 0.0–3.0)
Basophils Absolute: 0.1 10*3/uL (ref 0.0–0.1)
Eosinophils Absolute: 0.2 10*3/uL (ref 0.0–0.7)
Eosinophils Relative: 2.3 % (ref 0.0–5.0)
HEMATOCRIT: 39 % (ref 36.0–46.0)
Hemoglobin: 12.8 g/dL (ref 12.0–15.0)
LYMPHS ABS: 2.2 10*3/uL (ref 0.7–4.0)
LYMPHS PCT: 21.6 % (ref 12.0–46.0)
MCHC: 32.7 g/dL (ref 30.0–36.0)
MCV: 82.3 fl (ref 78.0–100.0)
MONO ABS: 0.6 10*3/uL (ref 0.1–1.0)
Monocytes Relative: 5.4 % (ref 3.0–12.0)
NEUTROS ABS: 7.2 10*3/uL (ref 1.4–7.7)
Neutrophils Relative %: 70.2 % (ref 43.0–77.0)
PLATELETS: 329 10*3/uL (ref 150.0–400.0)
RBC: 4.74 Mil/uL (ref 3.87–5.11)
RDW: 16 % — AB (ref 11.5–15.5)
WBC: 10.2 10*3/uL (ref 4.0–10.5)

## 2014-08-03 LAB — POCT RAPID STREP A (OFFICE): Rapid Strep A Screen: NEGATIVE

## 2014-08-03 LAB — MONONUCLEOSIS SCREEN: Mono Screen: NEGATIVE

## 2014-08-03 MED ORDER — AMOXICILLIN-POT CLAVULANATE 875-125 MG PO TABS: 1.0000 | ORAL_TABLET | Freq: Two times a day (BID) | ORAL | Status: DC

## 2014-08-03 NOTE — Progress Notes (Signed)
Pre visit review using our clinic review tool, if applicable. No additional management support is needed unless otherwise documented below in the visit note. 

## 2014-08-03 NOTE — Progress Notes (Signed)
Subjective:    Patient ID: Abigail [name], female    DOB: 1965-07-31, 49 y.o.   MRN: [id number]  HPI  Abigail [name] is a 49 year old female who presents today with a chief complaint of sore throat, ear fullness, and neck pain. She was evaluated on 07/23/14 for same symptoms which began on 07/21/14 after working in a cold environment and taking a drink after another person. She was placed on Amoxicillin twice daily for 7 days on 5/16 due to suspicion for bacterial pharyngitis.   She returns today with worsening sore throat and difficulty swallowing with some anterior neck pain and stiffness. She's been taking allegra, advil allergy, advil sinus, nasoquart which have helped with allergy symptoms. Denies fevers, congestion.  Review of Systems  Constitutional: Negative for fever and chills.  HENT: Positive for postnasal drip, sinus pressure and sore throat. Negative for congestion, ear pain and rhinorrhea.   Respiratory: Negative for shortness of breath.        Mild cough  Cardiovascular: Negative for chest pain.  Gastrointestinal: Positive for nausea. Negative for vomiting.  Musculoskeletal: Negative for myalgias.       Stiffness and sore to neck.       Past Medical History  Diagnosis Date  . Heart disease   . Kidney stones   . GERD (gastroesophageal reflux disease)   . Frequent headaches   . Cardiac arrhythmia due to congenital heart disease   . Migraine     History   Social History  . Marital Status: Single    Spouse Name: N/A  . Number of Children: N/A  . Years of Education: N/A   Occupational History  . Not on file.   Social History Main Topics  . Smoking status: Never Smoker   . Smokeless tobacco: Not on file  . Alcohol Use: No  . Drug Use: No  . Sexual Activity: Not on file   Other Topics Concern  . Not on file   Social History Narrative   Single.   Has a set of Twins, age 16.   Works for the [employer] and [employer] and [employer]   Enjoys  relaxing, spending time with her children.    Past Surgical History  Procedure Laterality Date  . Abdominal exploration surgery  1986  . Tubal ligation    . Nasal sinus surgery      Family History  Problem Relation Age of Onset  . COPD Father   . Emphysema Father     No Known Allergies  Current Outpatient Prescriptions on File Prior to Visit  Medication Sig Dispense Refill  . ALPRAZolam (XANAX) 0.5 MG tablet Take 0.5 mg by mouth 2 (two) times daily as needed for anxiety.    [name] aspirin 81 MG tablet Take 81 mg by mouth daily.    . Chlorpheniramine-PSE-Ibuprofen 2-30-200 MG TABS Take 1-2 tablets by mouth every 6 (six) hours as needed (for cold symptoms).    [name] esomeprazole (NEXIUM) 40 MG capsule Take 1 capsule (40 mg total) by mouth daily. 30 capsule 5  . FLUoxetine (PROZAC) 20 MG tablet Take 1/2 tablet by mouth daily for 6 days, then advance to 1 full tablet by mouth daily. 30 tablet 2  . ibuprofen (ADVIL,MOTRIN) 200 MG tablet Take 400 mg by mouth every 6 (six) hours as needed for pain or headache.    . medroxyPROGESTERone (PROVERA) 10 MG tablet Take 10 mg by mouth daily.    . metoprolol tartrate (LOPRESSOR) 25  MG tablet Take 0.5 tablets (12.5 mg total) by mouth daily. NEEDS APPOINTMENT FOR FUTURE REFILLS 8 tablet 0  . albuterol (PROVENTIL HFA;VENTOLIN HFA) 108 (90 BASE) MCG/ACT inhaler Inhale 1-2 puffs into the lungs every 4 (four) hours as needed for wheezing or shortness of breath (cough). (Patient not taking: Reported on 08/03/2014) 1 Inhaler 0  . ondansetron (ZOFRAN-ODT) 4 MG disintegrating tablet Take 4 mg by mouth 3 (three) times daily as needed for nausea or vomiting.    . traMADol (ULTRAM) 50 MG tablet Take 50 mg by mouth every 8 (eight) hours as needed (1 to 2 tablets).     No current facility-administered medications on file prior to visit.    BP 144/90 mmHg  Pulse 98  Temp(Src) 98.3 F (36.8 C) (Oral)  Ht 5' 2"  (1.575 m)  Wt 172 lb 1.9 oz (78.073 kg)  BMI 31.47 kg/m2   SpO2 97%  LMP 07/09/2014    Objective:   Physical Exam  Constitutional: She does not appear ill.  HENT:  Right Ear: Tympanic membrane and ear canal normal.  Left Ear: Tympanic membrane and ear canal normal.  Mouth/Throat: Posterior oropharyngeal edema and posterior oropharyngeal erythema present. No oropharyngeal exudate.  Neck: Neck supple.  Cardiovascular: Normal rate and regular rhythm.   Pulmonary/Chest: Effort normal and breath sounds normal.  Lymphadenopathy:    She has no cervical adenopathy.  Skin: Skin is warm and dry.          Assessment & Plan:  Pharyngitis:  Rapid Strep Negative. Suspect viral still, but patient is insistent on restarting antibiotics.  Due to erythema, worsening pain, and edema to posterior pharynx, despite OTC treatment, will treat again with antibiotics. RX augmentin BID for 10 days. CBC, BMP, and mono test today.

## 2014-08-03 NOTE — Patient Instructions (Addendum)
Start Augmentin antibiotics. Take 1 tablet by mouth twice daily for 10 days. Complete lab work prior to leaving today. I will notify you of your results. Please schedule a physical with me in the next 3 months. You will also schedule a lab only appointment one week prior. We will discuss your lab results during your physical.  Take care!  Pharyngitis Pharyngitis is redness, pain, and swelling (inflammation) of your pharynx.  CAUSES  Pharyngitis is usually caused by infection. Most of the time, these infections are from viruses (viral) and are part of a cold. However, sometimes pharyngitis is caused by bacteria (bacterial). Pharyngitis can also be caused by allergies. Viral pharyngitis may be spread from person to person by coughing, sneezing, and personal items or utensils (cups, forks, spoons, toothbrushes). Bacterial pharyngitis may be spread from person to person by more intimate contact, such as kissing.  SIGNS AND SYMPTOMS  Symptoms of pharyngitis include:   Sore throat.   Tiredness (fatigue).   Low-grade fever.   Headache.  Joint pain and muscle aches.  Skin rashes.  Swollen lymph nodes.  Plaque-like film on throat or tonsils (often seen with bacterial pharyngitis). DIAGNOSIS  Your health care provider will ask you questions about your illness and your symptoms. Your medical history, along with a physical exam, is often all that is needed to diagnose pharyngitis. Sometimes, a rapid strep test is done. Other lab tests may also be done, depending on the suspected cause.  TREATMENT  Viral pharyngitis will usually get better in 3-4 days without the use of medicine. Bacterial pharyngitis is treated with medicines that kill germs (antibiotics).  HOME CARE INSTRUCTIONS   Drink enough water and fluids to keep your urine clear or pale yellow.   Only take over-the-counter or prescription medicines as directed by your health care provider:   If you are prescribed antibiotics,  make sure you finish them even if you start to feel better.   Do not take aspirin.   Get lots of rest.   Gargle with 8 oz of salt water ( tsp of salt per 1 qt of water) as often as every 1-2 hours to soothe your throat.   Throat lozenges (if you are not at risk for choking) or sprays may be used to soothe your throat. SEEK MEDICAL CARE IF:   You have large, tender lumps in your neck.  You have a rash.  You cough up green, yellow-brown, or bloody spit. SEEK IMMEDIATE MEDICAL CARE IF:   Your neck becomes stiff.  You drool or are unable to swallow liquids.  You vomit or are unable to keep medicines or liquids down.  You have severe pain that does not go away with the use of recommended medicines.  You have trouble breathing (not caused by a stuffy nose). MAKE SURE YOU:   Understand these instructions.  Will watch your condition.  Will get help right away if you are not doing well or get worse. Document Released: 02/23/2005 Document Revised: 12/14/2012 Document Reviewed: 10/31/2012 [facility] Patient Information 2015 [place], [place]. This information is not intended to replace advice given to you by your health care provider. Make sure you discuss any questions you have with your health care provider.

## 2014-08-03 NOTE — Telephone Encounter (Signed)
Pt left v/m; pt was seen 07/25/14 and has same symptoms; S/T, ear and neck hurts; pt already has appt scheduled to see [name] NP on 08/03/14 at 10 AM.

## 2014-08-24 ENCOUNTER — Ambulatory Visit: Payer: 59 | Admitting: Family Medicine

## 2014-09-04 ENCOUNTER — Institutional Professional Consult (permissible substitution): Payer: 59 | Admitting: Pulmonary Disease

## 2014-09-12 ENCOUNTER — Institutional Professional Consult (permissible substitution): Payer: 59 | Admitting: Internal Medicine

## 2014-10-22 ENCOUNTER — Other Ambulatory Visit: Payer: Self-pay | Admitting: Primary Care

## 2014-10-22 NOTE — Telephone Encounter (Signed)
Electronically refill request Last prescribed on 07/19/2014 FLUoxetine (PROZAC) 20 MG tablet Dispense: 30 tablet   Refills: 2    Last seen on 08/03/2014. Next acute apt on 10/23/2014.

## 2014-10-23 ENCOUNTER — Ambulatory Visit (INDEPENDENT_AMBULATORY_CARE_PROVIDER_SITE_OTHER): Payer: Commercial Managed Care - HMO | Admitting: Primary Care

## 2014-10-23 ENCOUNTER — Other Ambulatory Visit: Payer: Self-pay | Admitting: *Deleted

## 2014-10-23 ENCOUNTER — Encounter: Payer: Self-pay | Admitting: Primary Care

## 2014-10-23 VITALS — BP 124/76 | HR 71 | Temp 98.1°F | Ht 62.0 in | Wt 173.0 lb

## 2014-10-23 DIAGNOSIS — F419 Anxiety disorder, unspecified: Principal | ICD-10-CM

## 2014-10-23 DIAGNOSIS — F418 Other specified anxiety disorders: Secondary | ICD-10-CM | POA: Diagnosis not present

## 2014-10-23 DIAGNOSIS — F329 Major depressive disorder, single episode, unspecified: Secondary | ICD-10-CM

## 2014-10-23 DIAGNOSIS — F32A Depression, unspecified: Secondary | ICD-10-CM

## 2014-10-23 MED ORDER — METOPROLOL TARTRATE 25 MG PO TABS
12.5000 mg | ORAL_TABLET | Freq: Every day | ORAL | Status: DC
Start: 2014-10-23 — End: 2015-04-11

## 2014-10-23 MED ORDER — FLUOXETINE HCL 40 MG PO CAPS: 40.0000 mg | ORAL_CAPSULE | Freq: Every day | ORAL | Status: DC

## 2014-10-23 NOTE — Progress Notes (Signed)
Subjective:    Patient ID: Abigail [name], female    DOB: [birth year], 49 y.o.   MRN: [id number]  HPI  Abigail [name] is a 49 year old female who presents today with a chief complaint of rash. She was outside in her yard all day last Wednesday pressure washing her house. She first noticed the rash this Sunday night, and she describes her rash as itchy. Her rash was worse yesterday. She took benadryl last night which helped with the itching.   2) GAD/Insomnia: Difficulty sleeping for the past 3 months. She has difficulty falling asleep and will wake up every 1-2 hours during the night. She's not taken anything over the counter for sleep. She works 2 part time jobs and will have to take naps during the day. She is also being treated for GAD with fluoxetine 20 mg. She fells better on the medication, however, does not feel completely at goal. Denies SI/HI.    Review of Systems  Respiratory: Negative for shortness of breath.   Cardiovascular: Negative for chest pain.  Skin: Positive for rash.  Psychiatric/Behavioral: Positive for sleep disturbance. The patient is nervous/anxious.        Past Medical History  Diagnosis Date  . Heart disease   . Kidney stones   . GERD (gastroesophageal reflux disease)   . Frequent headaches   . Cardiac arrhythmia due to congenital heart disease   . Migraine     Social History   Social History  . Marital Status: Single    Spouse Name: N/A  . Number of Children: N/A  . Years of Education: N/A   Occupational History  . Not on file.   Social History Main Topics  . Smoking status: Never Smoker   . Smokeless tobacco: Not on file  . Alcohol Use: No  . Drug Use: No  . Sexual Activity: Not on file   Other Topics Concern  . Not on file   Social History Narrative   Single.   Has a set of Twins, age 77.   Works for the [employer] and [employer] and [employer]   Enjoys relaxing, spending time with her children.    Past Surgical History   Procedure Laterality Date  . Abdominal exploration surgery  1986  . Tubal ligation    . Nasal sinus surgery      Family History  Problem Relation Age of Onset  . COPD Father   . Emphysema Father     No Known Allergies  Current Outpatient Prescriptions on File Prior to Visit  Medication Sig Dispense Refill  . ALPRAZolam (XANAX) 0.5 MG tablet Take 0.5 mg by mouth 2 (two) times daily as needed for anxiety.    [name] aspirin 81 MG tablet Take 81 mg by mouth daily.    . Chlorpheniramine-PSE-Ibuprofen 2-30-200 MG TABS Take 1-2 tablets by mouth every 6 (six) hours as needed (for cold symptoms).    [name] esomeprazole (NEXIUM) 40 MG capsule Take 1 capsule (40 mg total) by mouth daily. 30 capsule 5  . ibuprofen (ADVIL,MOTRIN) 200 MG tablet Take 400 mg by mouth every 6 (six) hours as needed for pain or headache.    . medroxyPROGESTERone (PROVERA) 10 MG tablet Take 10 mg by mouth daily.    . ondansetron (ZOFRAN-ODT) 4 MG disintegrating tablet Take 4 mg by mouth 3 (three) times daily as needed for nausea or vomiting.    . traMADol (ULTRAM) 50 MG tablet Take 50 mg by mouth every 8 (  eight) hours as needed (1 to 2 tablets).    [name] albuterol (PROVENTIL HFA;VENTOLIN HFA) 108 (90 BASE) MCG/ACT inhaler Inhale 1-2 puffs into the lungs every 4 (four) hours as needed for wheezing or shortness of breath (cough). (Patient not taking: Reported on 10/23/2014) 1 Inhaler 0   No current facility-administered medications on file prior to visit.    BP 124/76 mmHg  Pulse 71  Temp(Src) 98.1 F (36.7 C) (Oral)  Ht 5' 2"  (1.575 m)  Wt 173 lb (78.472 kg)  BMI 31.63 kg/m2  SpO2 98%    Objective:   Physical Exam  Constitutional: She appears well-nourished.  Cardiovascular: Normal rate and regular rhythm.   Pulmonary/Chest: Effort normal and breath sounds normal.  Skin: Skin is warm and dry.  Numerous, mild, insect appearing bites to left hip and [name] extremity. No drainage.  Psychiatric: She has a normal mood and  affect.          Assessment & Plan:  Insect bites:  Mild, present to left lateral hip and [name] extremity since Sunday. Improvement with benadryl last night. Treat with OTC hydrocortisone cream and daily antihistamine such as claritin.

## 2014-10-23 NOTE — Progress Notes (Signed)
Pre visit review using our clinic review tool, if applicable. No additional management support is needed unless otherwise documented below in the visit note. 

## 2014-10-23 NOTE — Assessment & Plan Note (Signed)
Improved, not at goal. Difficulty sleeping over past 3 months. Denies SI/HI. Increase Fluoxetine to 40 mg daily as this should help with anxiety and sleeping at night. Follow up in 2 months.

## 2014-10-23 NOTE — Patient Instructions (Addendum)
Start Fluoxetine 40 mg tablets. Take 1 tablet by mouth daily. This may help with sleep, but you may also try over the counter Melatonin or ZzQuil.  You may apply hydrocortisone cream to your rash, which may be purchased over the counter. You may also take an antihistamine such as claritin, zyrtec, or allegra during the day.  Follow up in 2 months for re-evaluation of anxiety and difficulty sleeping.  It was a pleasure to see you today!

## 2014-12-20 ENCOUNTER — Ambulatory Visit: Payer: Commercial Managed Care - HMO | Admitting: Primary Care

## 2014-12-21 ENCOUNTER — Ambulatory Visit (INDEPENDENT_AMBULATORY_CARE_PROVIDER_SITE_OTHER)
Admission: RE | Admit: 2014-12-21 | Discharge: 2014-12-21 | Disposition: A | Discharge status: Home / Self Care | Payer: Commercial Managed Care - HMO | Source: Ambulatory Visit | Attending: Primary Care | Admitting: Primary Care

## 2014-12-21 ENCOUNTER — Other Ambulatory Visit: Payer: Self-pay | Admitting: Primary Care

## 2014-12-21 ENCOUNTER — Ambulatory Visit (INDEPENDENT_AMBULATORY_CARE_PROVIDER_SITE_OTHER): Payer: Commercial Managed Care - HMO | Admitting: Primary Care

## 2014-12-21 ENCOUNTER — Encounter: Payer: Self-pay | Admitting: Primary Care

## 2014-12-21 VITALS — BP 130/78 | HR 72 | Temp 98.2°F | Ht 62.0 in | Wt 175.4 lb

## 2014-12-21 DIAGNOSIS — R109 Unspecified abdominal pain: Secondary | ICD-10-CM

## 2014-12-21 DIAGNOSIS — N200 Calculus of kidney: Secondary | ICD-10-CM

## 2014-12-21 DIAGNOSIS — R110 Nausea: Secondary | ICD-10-CM

## 2014-12-21 LAB — POCT URINALYSIS DIPSTICK
Bilirubin, UA: NEGATIVE
Blood, UA: NEGATIVE
Glucose, UA: NEGATIVE
Ketones, UA: NEGATIVE
Leukocytes, UA: NEGATIVE
Nitrite, UA: NEGATIVE
PH UA: 7
PROTEIN UA: NEGATIVE
SPEC GRAV UA: 1.02
UROBILINOGEN UA: NEGATIVE

## 2014-12-21 MED ORDER — ONDANSETRON 4 MG PO TBDP: 4.0000 mg | ORAL_TABLET | Freq: Three times a day (TID) | ORAL | Status: DC | PRN

## 2014-12-21 MED ORDER — TAMSULOSIN HCL 0.4 MG PO CAPS: ORAL_CAPSULE | ORAL | Status: DC

## 2014-12-21 NOTE — Progress Notes (Signed)
Subjective:    Patient ID: Abigail [name], female    DOB: [birth year], 49 y.o.   MRN: [id number]  HPI  Abigail [name] is a 49 year old female who presents today with a chief complaint of left sided flank pain, urgency, frequency, pelvic pressure. Her symptoms have been present for about 2 weeks. She's taken some Emergen C and started drinking cranberry juice. Denies fevers, hematuria, vaginal discharge, vaginal itching.   Review of Systems  Constitutional: Negative for fever and chills.  Genitourinary: Positive for urgency, frequency and flank pain. Negative for dysuria, hematuria, vaginal discharge, difficulty urinating and vaginal pain.       Past Medical History  Diagnosis Date  . Heart disease   . Kidney stones   . GERD (gastroesophageal reflux disease)   . Frequent headaches   . Cardiac arrhythmia due to congenital heart disease   . Migraine     Social History   Social History  . Marital Status: Single    Spouse Name: N/A  . Number of Children: N/A  . Years of Education: N/A   Occupational History  . Not on file.   Social History Main Topics  . Smoking status: Never Smoker   . Smokeless tobacco: Not on file  . Alcohol Use: No  . Drug Use: No  . Sexual Activity: Not on file   Other Topics Concern  . Not on file   Social History Narrative   Single.   Has a set of Twins, age 26.   Works for the [employer] and [employer] and [employer]   Enjoys relaxing, spending time with her children.    Past Surgical History  Procedure Laterality Date  . Abdominal exploration surgery  1986  . Tubal ligation    . Nasal sinus surgery      Family History  Problem Relation Age of Onset  . COPD Father   . Emphysema Father     No Known Allergies  Current Outpatient Prescriptions on File Prior to Visit  Medication Sig Dispense Refill  . ALPRAZolam (XANAX) 0.5 MG tablet Take 0.5 mg by mouth 2 (two) times daily as needed for anxiety.    [name] aspirin 81 MG tablet  Take 81 mg by mouth daily.    . Chlorpheniramine-PSE-Ibuprofen 2-30-200 MG TABS Take 1-2 tablets by mouth every 6 (six) hours as needed (for cold symptoms).    [name] esomeprazole (NEXIUM) 40 MG capsule Take 1 capsule (40 mg total) by mouth daily. 30 capsule 5  . FLUoxetine (PROZAC) 40 MG capsule Take 1 capsule (40 mg total) by mouth daily. 30 capsule 3  . ibuprofen (ADVIL,MOTRIN) 200 MG tablet Take 400 mg by mouth every 6 (six) hours as needed for pain or headache.    . medroxyPROGESTERone (PROVERA) 10 MG tablet Take 10 mg by mouth daily.    . metoprolol tartrate (LOPRESSOR) 25 MG tablet Take 0.5 tablets (12.5 mg total) by mouth daily. NEED OV. 7 tablet 0  . ondansetron (ZOFRAN-ODT) 4 MG disintegrating tablet Take 4 mg by mouth 3 (three) times daily as needed for nausea or vomiting.    . traMADol (ULTRAM) 50 MG tablet Take 50 mg by mouth every 8 (eight) hours as needed (1 to 2 tablets).    [name] albuterol (PROVENTIL HFA;VENTOLIN HFA) 108 (90 BASE) MCG/ACT inhaler Inhale 1-2 puffs into the lungs every 4 (four) hours as needed for wheezing or shortness of breath (cough). (Patient not taking: Reported on 10/23/2014) 1 Inhaler 0  No current facility-administered medications on file prior to visit.    BP 130/78 mmHg  Pulse 72  Temp(Src) 98.2 F (36.8 C) (Oral)  Ht 5' 2"  (1.575 m)  Wt 175 lb 6.4 oz (79.561 kg)  BMI 32.07 kg/m2  SpO2 98%    Objective:   Physical Exam  Constitutional: She appears well-nourished.  Cardiovascular: Normal rate and regular rhythm.   Pulmonary/Chest: Effort normal and breath sounds normal.  Abdominal: Soft. Normal appearance and bowel sounds are normal. There is no tenderness. There is CVA tenderness.  Mild CVA tenderness to left side  Skin: Skin is warm and dry.          Assessment & Plan:  Urinary frequency:  Present for 2 weeks with pelvic pressure, urgency. No vaginal symptoms. UA: Negative. Suspect cystocele, she declines pelvic exam. Will get KUB to  rule out renal stone as she has a history. If negative will discus options with patient via phone, consider urology referral.

## 2014-12-21 NOTE — Patient Instructions (Signed)
Complete xray(s) prior to leaving today. I will contact you regarding your results.  Your urine does not show evidence of infection.  Continue to drink plenty of water.  It was a pleasure to see you today!

## 2014-12-21 NOTE — Progress Notes (Signed)
Pre visit review using our clinic review tool, if applicable. No additional management support is needed unless otherwise documented below in the visit note. 

## 2014-12-24 ENCOUNTER — Telehealth: Payer: Self-pay

## 2014-12-24 NOTE — Telephone Encounter (Signed)
Pt left v/m; pt seen 12/21/14 with kidney stone; pt request different pain med other than tramadol sent to [facility] [address]. Pt request cb.

## 2014-12-24 NOTE — Telephone Encounter (Signed)
Why is that? Is the Tramadol working to relive her pain? Has she picked it up yet?

## 2014-12-25 ENCOUNTER — Other Ambulatory Visit: Payer: Self-pay | Admitting: Primary Care

## 2014-12-25 ENCOUNTER — Telehealth: Payer: Self-pay | Admitting: Primary Care

## 2014-12-25 DIAGNOSIS — N200 Calculus of kidney: Secondary | ICD-10-CM

## 2014-12-25 MED ORDER — HYDROCODONE-ACETAMINOPHEN 5-325 MG PO TABS: 1.0000 | ORAL_TABLET | Freq: Three times a day (TID) | ORAL | Status: DC | PRN

## 2014-12-25 NOTE — Telephone Encounter (Signed)
Noted. Please call in script in "meds and orders."

## 2014-12-25 NOTE — Telephone Encounter (Signed)
Called patient and let her know Rx for Hydrocodone-acetaminophen is ready for pick up. Notified her that [facility] cannot refill the metoprolol as it was prescribed by her cardio doctor. Patient verbalized understanding.

## 2014-12-25 NOTE — Telephone Encounter (Signed)
Called patient and asked her regarding Tramadol. Patient stated that Tramadol is not helping with pain. She request to have another medication to be sent in. Patient also asked if possible can refill metoprolol as well.

## 2014-12-26 ENCOUNTER — Institutional Professional Consult (permissible substitution): Payer: Commercial Managed Care - HMO | Admitting: Internal Medicine

## 2015-02-08 ENCOUNTER — Encounter: Payer: Self-pay | Admitting: Primary Care

## 2015-02-08 ENCOUNTER — Ambulatory Visit (INDEPENDENT_AMBULATORY_CARE_PROVIDER_SITE_OTHER): Payer: Commercial Managed Care - HMO | Admitting: Primary Care

## 2015-02-08 VITALS — BP 132/80 | HR 78 | Temp 97.8°F | Ht 62.0 in | Wt 183.0 lb

## 2015-02-08 DIAGNOSIS — M5441 Lumbago with sciatica, right side: Secondary | ICD-10-CM | POA: Diagnosis not present

## 2015-02-08 DIAGNOSIS — F411 Generalized anxiety disorder: Secondary | ICD-10-CM | POA: Diagnosis not present

## 2015-02-08 DIAGNOSIS — F418 Other specified anxiety disorders: Secondary | ICD-10-CM

## 2015-02-08 DIAGNOSIS — F419 Anxiety disorder, unspecified: Secondary | ICD-10-CM

## 2015-02-08 DIAGNOSIS — N200 Calculus of kidney: Secondary | ICD-10-CM

## 2015-02-08 DIAGNOSIS — F32A Depression, unspecified: Secondary | ICD-10-CM

## 2015-02-08 DIAGNOSIS — M549 Dorsalgia, unspecified: Secondary | ICD-10-CM

## 2015-02-08 DIAGNOSIS — M5442 Lumbago with sciatica, left side: Secondary | ICD-10-CM

## 2015-02-08 DIAGNOSIS — G8929 Other chronic pain: Secondary | ICD-10-CM

## 2015-02-08 DIAGNOSIS — F329 Major depressive disorder, single episode, unspecified: Secondary | ICD-10-CM

## 2015-02-08 MED ORDER — ALPRAZOLAM 0.5 MG PO TABS: 0.5000 mg | ORAL_TABLET | Freq: Two times a day (BID) | ORAL | Status: DC | PRN

## 2015-02-08 MED ORDER — HYDROCODONE-ACETAMINOPHEN 5-325 MG PO TABS: 1.0000 | ORAL_TABLET | Freq: Three times a day (TID) | ORAL | Status: DC | PRN

## 2015-02-08 NOTE — Progress Notes (Signed)
Subjective:    Patient ID: Abigail Abigail [name], female    DOB: November 06, 1965, 49 y.o.   MRN: [id number]  HPI  Abigail Abigail [name] if a 49 year old female who presents today with a chief complaint of back pain. Her pain is located to her mid and Abigail [name] back. Her pain is worse with movement, prolonged sitting, prolonged standing. She describes her pain as achy, pressure, "twisting sensation". She's taken muscle relaxers and advil without relief. Her pain is now affecting her occupation. She was evaluated for this in April 2016. Xray with L5 abnormality. She's been working to do exercises and stretches since and her pain is now worse. She has radiculopathy to bilateral Abigail [name] extremities intermittently. The increased back pain as caused an increase in anxiety symptoms as she's worried something has become worse in her back.   Review of Systems  Respiratory: Negative for shortness of breath.   Cardiovascular: Negative for chest pain.  Musculoskeletal: Positive for back pain.  Neurological: Positive for numbness.  Psychiatric/Behavioral: The patient is nervous/anxious.        Past Medical History  Diagnosis Date  . Heart disease   . Kidney stones   . GERD (gastroesophageal reflux disease)   . Frequent headaches   . Cardiac arrhythmia due to congenital heart disease   . Migraine     Social History   Social History  . Marital Status: Single    Spouse Name: N/A  . Number of Children: N/A  . Years of Education: N/A   Occupational History  . Not on file.   Social History Main Topics  . Smoking status: Never Smoker   . Smokeless tobacco: Not on file  . Alcohol Use: No  . Drug Use: No  . Sexual Activity: Not on file   Other Topics Concern  . Not on file   Social History Narrative   Single.   Has a set of Twins, age 41.   Works for the [employer] and [employer] and [employer]   Enjoys relaxing, spending time with her children.    Past Surgical History  Procedure Laterality Date   . Abdominal exploration surgery  1986  . Tubal ligation    . Nasal sinus surgery      Family History  Problem Relation Age of Onset  . COPD Father   . Emphysema Father     No Known Allergies  Current Outpatient Prescriptions on File Prior to Visit  Medication Sig Dispense Refill  . aspirin 81 MG tablet Take 81 mg by mouth daily.    . Chlorpheniramine-PSE-Ibuprofen 2-30-200 MG TABS Take 1-2 tablets by mouth every 6 (six) hours as needed (for cold symptoms).    [name] esomeprazole (NEXIUM) 40 MG capsule Take 1 capsule (40 mg total) by mouth daily. 30 capsule 5  . FLUoxetine (PROZAC) 40 MG capsule Take 1 capsule (40 mg total) by mouth daily. 30 capsule 3  . ibuprofen (ADVIL,MOTRIN) 200 MG tablet Take 400 mg by mouth every 6 (six) hours as needed for pain or headache.    . medroxyPROGESTERone (PROVERA) 10 MG tablet Take 10 mg by mouth daily.    . metoprolol tartrate (LOPRESSOR) 25 MG tablet Take 0.5 tablets (12.5 mg total) by mouth daily. NEED OV. 7 tablet 0  . tamsulosin (FLOMAX) 0.4 MG CAPS capsule Take 1 capsule by mouth daily to help release kidney stone. 30 capsule 0  . albuterol (PROVENTIL HFA;VENTOLIN HFA) 108 (90 BASE) MCG/ACT inhaler Inhale 1-2 puffs into the  lungs every 4 (four) hours as needed for wheezing or shortness of breath (cough). (Patient not taking: Reported on 10/23/2014) 1 Inhaler 0   No current facility-administered medications on file prior to visit.    BP 132/80 mmHg  Pulse 78  Temp(Src) 97.8 F (36.6 C) (Oral)  Ht 5' 2"  (1.575 m)  Wt 183 lb (83.008 kg)  BMI 33.46 kg/m2  SpO2 98%  LMP 02/08/2015    Objective:   Physical Exam  Constitutional: She appears well-nourished.  Cardiovascular: Normal rate and regular rhythm.   Pulmonary/Chest: Effort normal and breath sounds normal.  Musculoskeletal:       Lumbar back: She exhibits decreased range of motion and pain. She exhibits no tenderness and no spasm.  Neurological: She has normal reflexes.  Skin: Skin  is warm and dry.  Psychiatric:  Slightly anxious during exam          Assessment & Plan:

## 2015-02-08 NOTE — Assessment & Plan Note (Signed)
Increased anxiety over back problems. Will provide 1 refill of xanax and have her follow up for continued anxiety/depression if no improvement. May need to add medication. UDS and contract obtained today.

## 2015-02-08 NOTE — Assessment & Plan Note (Signed)
Increased low back pain over last several months, now affecting her job. Intermittent numbness/tingling. No improvement with supportive treatment. Temporary relief with pain medication. MRI of lumbar spine ordered. Will continue to monitor, may need to send to ortho.

## 2015-02-08 NOTE — Patient Instructions (Signed)
Stop by the front and speak with [facility] regarding your MRI.  Stop by the lab for the contract and drug screen.  Refills have been provided for the Xanax and your Hydrocodone.  I will be in touch with you regarding your MRI results.  It was a pleasure to see you today!

## 2015-02-08 NOTE — Progress Notes (Signed)
Pre visit review using our clinic review tool, if applicable. No additional management support is needed unless otherwise documented below in the visit note. 

## 2015-02-11 ENCOUNTER — Ambulatory Visit
Admission: RE | Admit: 2015-02-11 | Discharge: 2015-02-11 | Disposition: A | Discharge status: Home / Self Care | Payer: Commercial Managed Care - HMO | Source: Ambulatory Visit | Attending: Primary Care | Admitting: Primary Care

## 2015-02-11 DIAGNOSIS — M5442 Lumbago with sciatica, left side: Principal | ICD-10-CM

## 2015-02-11 DIAGNOSIS — M5441 Lumbago with sciatica, right side: Secondary | ICD-10-CM

## 2015-02-12 ENCOUNTER — Other Ambulatory Visit: Payer: Self-pay | Admitting: Primary Care

## 2015-02-12 DIAGNOSIS — M5442 Lumbago with sciatica, left side: Principal | ICD-10-CM

## 2015-02-12 DIAGNOSIS — M5441 Lumbago with sciatica, right side: Secondary | ICD-10-CM

## 2015-02-21 ENCOUNTER — Encounter: Payer: Self-pay | Admitting: Primary Care

## 2015-04-11 ENCOUNTER — Ambulatory Visit (INDEPENDENT_AMBULATORY_CARE_PROVIDER_SITE_OTHER): Payer: Commercial Managed Care - HMO | Admitting: Primary Care

## 2015-04-11 ENCOUNTER — Telehealth: Payer: Self-pay | Admitting: Primary Care

## 2015-04-11 ENCOUNTER — Ambulatory Visit: Payer: Self-pay | Admitting: Orthopedic Surgery

## 2015-04-11 ENCOUNTER — Other Ambulatory Visit: Payer: Self-pay

## 2015-04-11 ENCOUNTER — Encounter: Payer: Self-pay | Admitting: Primary Care

## 2015-04-11 VITALS — BP 124/78 | HR 77 | Temp 98.7°F | Ht 62.0 in | Wt 182.4 lb

## 2015-04-11 DIAGNOSIS — G8929 Other chronic pain: Secondary | ICD-10-CM

## 2015-04-11 DIAGNOSIS — R002 Palpitations: Secondary | ICD-10-CM | POA: Diagnosis not present

## 2015-04-11 DIAGNOSIS — M549 Dorsalgia, unspecified: Principal | ICD-10-CM

## 2015-04-11 DIAGNOSIS — M5441 Lumbago with sciatica, right side: Secondary | ICD-10-CM

## 2015-04-11 DIAGNOSIS — R0789 Other chest pain: Secondary | ICD-10-CM | POA: Diagnosis not present

## 2015-04-11 MED ORDER — METOPROLOL TARTRATE 25 MG PO TABS: 12.5000 mg | ORAL_TABLET | Freq: Every day | ORAL | Status: DC

## 2015-04-11 NOTE — Telephone Encounter (Signed)
Pt left v/m requesting rx hydrocodone apap. Call when ready for pick up.pt last seen and rx printed # 60 on 02/08/15. Pt also requested rx for tramadol but tramadol is on hx med list with note change in therapy.Please advise.

## 2015-04-11 NOTE — Progress Notes (Signed)
Subjective:    Patient ID: Abigail [name], female    DOB: [birth year], 50 y.o.   MRN: [id number]  HPI  Abigail [name] is a 50 year old female who presents today with a chief complaint of headache. She also reports nausea, chest tightness, and believes her BP has been elevated. She's not checked her blood pressure this morning. She's felt this way in the past when running out of her metoprolol.  She was once managed on metoprolol 12.5 mg daily for palpitations through cardiology. She's underwent cardiac catheterization in 2012 with normal coronary arteries. She's not regularly taken her metoprolol in several months. She has had some intermittent chest pain since off of her metoprolol, but feels improved since taking the 2 doses (she found last night at home) of her remaining metoprolol. Currently reports mild chest tightness/discomfort. Denies dizziness, SOB.  2) Chronic Back Pain: MRI completed in early December 2016 which showed bulging discs and a hairline fracture. She was evaluated by a spine specialist in early January.  She is due to see them again February 16th for discussion of injections. She was managed on prednisone, and is currently managed on Meloxicam. If no improvement with injections then they may due surgery. She's working to start exercising gradually in order lose weight. She does not wish to resort to surgery so she's trying to improve overall health. She would like narcotic pain medication for back pain and states the [facility].   Review of Systems  Respiratory: Positive for chest tightness. Negative for shortness of breath.   Cardiovascular: Positive for palpitations. Negative for chest pain.  Musculoskeletal: Positive for back pain.  Neurological: Positive for headaches. Negative for dizziness and numbness.       Past Medical History  Diagnosis Date  . Heart disease   . Kidney stones   . GERD (gastroesophageal reflux disease)   . Frequent  headaches   . Cardiac arrhythmia due to congenital heart disease   . Migraine     Social History   Social History  . Marital Status: Single    Spouse Name: N/A  . Number of Children: N/A  . Years of Education: N/A   Occupational History  . Not on file.   Social History Main Topics  . Smoking status: Never Smoker   . Smokeless tobacco: Not on file  . Alcohol Use: No  . Drug Use: No  . Sexual Activity: Not on file   Other Topics Concern  . Not on file   Social History Narrative   Single.   Has a set of Twins, age 12.   Works for the [employer] and [employer] and [employer]   Enjoys relaxing, spending time with her children.    Past Surgical History  Procedure Laterality Date  . Abdominal exploration surgery  1986  . Tubal ligation    . Nasal sinus surgery      Family History  Problem Relation Age of Onset  . COPD Father   . Emphysema Father     No Known Allergies  Current Outpatient Prescriptions on File Prior to Visit  Medication Sig Dispense Refill  . ALPRAZolam (XANAX) 0.5 MG tablet Take 1 tablet (0.5 mg total) by mouth 2 (two) times daily as needed for anxiety. 60 tablet 0  . aspirin 81 MG tablet Take 81 mg by mouth daily.    . Chlorpheniramine-PSE-Ibuprofen 2-30-200 MG TABS Take 1-2 tablets by mouth every 6 (six) hours as needed (for  cold symptoms).    [name] esomeprazole (NEXIUM) 40 MG capsule Take 1 capsule (40 mg total) by mouth daily. 30 capsule 5  . FLUoxetine (PROZAC) 40 MG capsule Take 1 capsule (40 mg total) by mouth daily. 30 capsule 3  . HYDROcodone-acetaminophen (NORCO/VICODIN) 5-325 MG tablet Take 1-2 tablets by mouth every 8 (eight) hours as needed for severe pain. 60 tablet 0  . ibuprofen (ADVIL,MOTRIN) 200 MG tablet Take 400 mg by mouth every 6 (six) hours as needed for pain or headache.    . medroxyPROGESTERone (PROVERA) 10 MG tablet Take 10 mg by mouth daily.    . tamsulosin (FLOMAX) 0.4 MG CAPS capsule Take 1 capsule by mouth daily to  help release kidney stone. 30 capsule 0  . albuterol (PROVENTIL HFA;VENTOLIN HFA) 108 (90 BASE) MCG/ACT inhaler Inhale 1-2 puffs into the lungs every 4 (four) hours as needed for wheezing or shortness of breath (cough). (Patient not taking: Reported on 10/23/2014) 1 Inhaler 0   No current facility-administered medications on file prior to visit.    BP 124/78 mmHg  Pulse 77  Temp(Src) 98.7 F (37.1 C) (Oral)  Ht 5' 2"  (1.575 m)  Wt 182 lb 6.4 oz (82.736 kg)  BMI 33.35 kg/m2  SpO2 97%  LMP 02/08/2015    Objective:   Physical Exam  Constitutional: She appears well-nourished.  Cardiovascular: Normal rate and regular rhythm.   No murmur heard. Pulmonary/Chest: Effort normal and breath sounds normal. She has no wheezes. She has no rales.  Skin: Skin is warm and dry.          Assessment & Plan:

## 2015-04-11 NOTE — Progress Notes (Signed)
Pre visit review using our clinic review tool, if applicable. No additional management support is needed unless otherwise documented below in the visit note. 

## 2015-04-11 NOTE — Telephone Encounter (Signed)
Pt complaining with H/A, tightness in chest,nausea, face is red and elevated BP; pt does not have ability to ck BP but pt is sure BP is elevated. Pt last took metoprolol 10/2014. Pt will not go to ED. Spoke with [name] NP and she said pt needs to be seen; pt scheduled 30 min appt today at 11 am with [name] NP; advised pt if condition changed or worsened prior to appt to go to ED; pt voiced understanding.

## 2015-04-11 NOTE — Patient Instructions (Addendum)
[facility] for [facility] management as I do not manage back pain with narcotics.   I've sent refills of metoprolol 12.5 mg to your pharmacy.   Your ECG looks good. No changes from March of 2015.  Please schedule a physical with me within the next 3-6 months. You may also schedule a lab only appointment 3-4 days prior. We will discuss your lab results in detail during your physical.  It was a pleasure to see you today!

## 2015-04-11 NOTE — Assessment & Plan Note (Signed)
Out of metoprolol for months, never notified our office or cardiology.  ECG today stable and is without changes when compared to ECG in 05/2013. Suspect this could also be stemming from uncontrolled anxiety and she does worry often. Will restart metoprolol today and closely monitor.

## 2015-04-11 NOTE — Assessment & Plan Note (Addendum)
MRI with 2 bulging discs and "hariline fracture".  Currently managed with spine center and will be undergoing injections in several weeks. She reports they've offered to manage pain. Will have her follow with them for pain management. Surgery may occur if injections don't improve symptoms.

## 2015-04-22 ENCOUNTER — Institutional Professional Consult (permissible substitution): Payer: Commercial Managed Care - HMO | Admitting: Internal Medicine

## 2015-05-31 ENCOUNTER — Ambulatory Visit (INDEPENDENT_AMBULATORY_CARE_PROVIDER_SITE_OTHER): Payer: Commercial Managed Care - HMO | Admitting: Primary Care

## 2015-05-31 ENCOUNTER — Encounter: Payer: Self-pay | Admitting: Primary Care

## 2015-05-31 VITALS — BP 142/90 | HR 86 | Temp 98.2°F | Wt 183.5 lb

## 2015-05-31 DIAGNOSIS — R05 Cough: Secondary | ICD-10-CM

## 2015-05-31 DIAGNOSIS — R059 Cough, unspecified: Secondary | ICD-10-CM

## 2015-05-31 DIAGNOSIS — J3489 Other specified disorders of nose and nasal sinuses: Secondary | ICD-10-CM

## 2015-05-31 MED ORDER — PREDNISONE 10 MG PO TABS: ORAL_TABLET | ORAL | Status: DC

## 2015-05-31 MED ORDER — ALBUTEROL SULFATE HFA 108 (90 BASE) MCG/ACT IN AERS: 1.0000 | INHALATION_SPRAY | Freq: Four times a day (QID) | RESPIRATORY_TRACT | Status: DC | PRN

## 2015-05-31 MED ORDER — HYDROCOD POLST-CPM POLST ER 10-8 MG/5ML PO SUER: 5.0000 mL | Freq: Two times a day (BID) | ORAL | Status: DC | PRN

## 2015-05-31 MED ORDER — BENZONATATE 200 MG PO CAPS: 200.0000 mg | ORAL_CAPSULE | Freq: Three times a day (TID) | ORAL | Status: DC | PRN

## 2015-05-31 NOTE — Patient Instructions (Signed)
You may take Benzonatate capsules for cough. Take 1 capsule by mouth three times daily as needed for cough.  You may take the Tussionex cough suppressant twice daily as needed for cough and rest. Caution this medication contains codeine and will make you feel drowsy.  Start Prednisone tablets for sinus pressure and congestion. Take 3 tablets for 2 days, then 2 tablets for 2 days, then 1 tablet for 2 days.  You may use the albuterol inhaler every 6 hours as needed for wheezing, cough, and shortness of breath.  Please notify me if you develop persistent fevers of 101, notice increased fatigue or weakness, or feel worse after day 10 of symptoms.   Increase consumption of water intake and rest.  It was a pleasure to see you today!  Upper Respiratory Infection, Adult Most upper respiratory infections (URIs) are a viral infection of the air passages leading to the lungs. A URI affects the nose, throat, and upper air passages. The most common type of URI is nasopharyngitis and is typically referred to as "the common cold." URIs run their course and usually go away on their own. Most of the time, a URI does not require medical attention, but sometimes a bacterial infection in the upper airways can follow a viral infection. This is called a secondary infection. Sinus and middle ear infections are common types of secondary upper respiratory infections. Bacterial pneumonia can also complicate a URI. A URI can worsen asthma and chronic obstructive pulmonary disease (COPD). Sometimes, these complications can require emergency medical care and may be life threatening.  CAUSES Almost all URIs are caused by viruses. A virus is a type of germ and can spread from one person to another.  RISKS FACTORS You may be at risk for a URI if:   You smoke.   You have chronic heart or lung disease.  You have a weakened defense (immune) system.   You are very young or very old.   You have nasal allergies or  asthma.  You work in crowded or poorly ventilated areas.  You work in health care facilities or schools. SIGNS AND SYMPTOMS  Symptoms typically develop 2-3 days after you come in contact with a cold virus. Most viral URIs last 7-10 days. However, viral URIs from the influenza virus (flu virus) can last 14-18 days and are typically more severe. Symptoms may include:   Runny or stuffy (congested) nose.   Sneezing.   Cough.   Sore throat.   Headache.   Fatigue.   Fever.   Loss of appetite.   Pain in your forehead, behind your eyes, and over your cheekbones (sinus pain).  Muscle aches.  DIAGNOSIS  Your health care provider may diagnose a URI by:  Physical exam.  Tests to check that your symptoms are not due to another condition such as:  Strep throat.  Sinusitis.  Pneumonia.  Asthma. TREATMENT  A URI goes away on its own with time. It cannot be cured with medicines, but medicines may be prescribed or recommended to relieve symptoms. Medicines may help:  Reduce your fever.  Reduce your cough.  Relieve nasal congestion. HOME CARE INSTRUCTIONS   Take medicines only as directed by your health care provider.   Gargle warm saltwater or take cough drops to comfort your throat as directed by your health care provider.  Use a warm mist humidifier or inhale steam from a shower to increase air moisture. This may make it easier to breathe.  Drink enough fluid to  keep your urine clear or pale yellow.   Eat soups and other clear broths and maintain good nutrition.   Rest as needed.   Return to work when your temperature has returned to normal or as your health care provider advises. You may need to stay home longer to avoid infecting others. You can also use a face mask and careful hand washing to prevent spread of the virus.  Increase the usage of your inhaler if you have asthma.   Do not use any tobacco products, including cigarettes, chewing tobacco,  or electronic cigarettes. If you need help quitting, ask your health care provider. PREVENTION  The best way to protect yourself from getting a cold is to practice good hygiene.   Avoid oral or hand contact with people with cold symptoms.   Wash your hands often if contact occurs.  There is no clear evidence that vitamin C, vitamin E, echinacea, or exercise reduces the chance of developing a cold. However, it is always recommended to get plenty of rest, exercise, and practice good nutrition.  SEEK MEDICAL CARE IF:   You are getting worse rather than better.   Your symptoms are not controlled by medicine.   You have chills.  You have worsening shortness of breath.  You have brown or red mucus.  You have yellow or brown nasal discharge.  You have pain in your face, especially when you bend forward.  You have a fever.  You have swollen neck glands.  You have pain while swallowing.  You have white areas in the back of your throat. SEEK IMMEDIATE MEDICAL CARE IF:   You have severe or persistent:  Headache.  Ear pain.  Sinus pain.  Chest pain.  You have chronic lung disease and any of the following:  Wheezing.  Prolonged cough.  Coughing up blood.  A change in your usual mucus.  You have a stiff neck.  You have changes in your:  Vision.  Hearing.  Thinking.  Mood. MAKE SURE YOU:   Understand these instructions.  Will watch your condition.  Will get help right away if you are not doing well or get worse.   This information is not intended to replace advice given to you by your health care provider. Make sure you discuss any questions you have with your health care provider.   Document Released: 08/19/2000 Document Revised: 07/10/2014 Document Reviewed: 05/31/2013 Elsevier Interactive Patient Education [employer].

## 2015-05-31 NOTE — Progress Notes (Signed)
Pre visit review using our clinic review tool, if applicable. No additional management support is needed unless otherwise documented below in the visit note. 

## 2015-05-31 NOTE — Progress Notes (Signed)
Subjective:    Patient ID: Abigail [name], female    DOB: 03-Aug-1965, 50 y.o.   MRN: [id number]  HPI  Abigail [name] is a 50 year old female who presents today with a chief complaint of cough. She also reports sore throat, nasal congestion, body aches, and fatigue. Her symptoms began Sunday this week with a sore throat. Over the last several days she's noticed her other symptoms progressing. She's taken Advil sinus, advil allergy, Nasacort, and her albuterol inhaler without improvement. Denies sick contacts. She was able to sleep well last night which was an improvement.  Review of Systems  Constitutional: Positive for fatigue. Negative for fever and chills.  HENT: Positive for congestion, postnasal drip, sneezing and sore throat.   Respiratory: Positive for cough and shortness of breath.   Cardiovascular: Negative for chest pain.  Musculoskeletal: Positive for myalgias.       Past Medical History  Diagnosis Date  . Heart disease   . Kidney stones   . GERD (gastroesophageal reflux disease)   . Frequent headaches   . Cardiac arrhythmia due to congenital heart disease   . Migraine     Social History   Social History  . Marital Status: Single    Spouse Name: N/A  . Number of Children: N/A  . Years of Education: N/A   Occupational History  . Not on file.   Social History Main Topics  . Smoking status: Never Smoker   . Smokeless tobacco: Not on file  . Alcohol Use: No  . Drug Use: No  . Sexual Activity: Not on file   Other Topics Concern  . Not on file   Social History Narrative   Single.   Has a set of Twins, age 57.   Works for the [employer] and [employer] and [employer]   Enjoys relaxing, spending time with her children.    Past Surgical History  Procedure Laterality Date  . Abdominal exploration surgery  1986  . Tubal ligation    . Nasal sinus surgery      Family History  Problem Relation Age of Onset  . COPD Father   . Emphysema Father      No Known Allergies  Current Outpatient Prescriptions on File Prior to Visit  Medication Sig Dispense Refill  . ALPRAZolam (XANAX) 0.5 MG tablet Take 1 tablet (0.5 mg total) by mouth 2 (two) times daily as needed for anxiety. 60 tablet 0  . aspirin 81 MG tablet Take 81 mg by mouth daily.    . Chlorpheniramine-PSE-Ibuprofen 2-30-200 MG TABS Take 1-2 tablets by mouth every 6 (six) hours as needed (for cold symptoms).    [name] esomeprazole (NEXIUM) 40 MG capsule Take 1 capsule (40 mg total) by mouth daily. 30 capsule 5  . FLUoxetine (PROZAC) 40 MG capsule Take 1 capsule (40 mg total) by mouth daily. 30 capsule 3  . HYDROcodone-acetaminophen (NORCO/VICODIN) 5-325 MG tablet Take 1-2 tablets by mouth every 8 (eight) hours as needed for severe pain. 60 tablet 0  . ibuprofen (ADVIL,MOTRIN) 200 MG tablet Take 400 mg by mouth every 6 (six) hours as needed for pain or headache.    . medroxyPROGESTERone (PROVERA) 10 MG tablet Take 10 mg by mouth daily.    . metoprolol tartrate (LOPRESSOR) 25 MG tablet Take 0.5 tablets (12.5 mg total) by mouth daily. 90 tablet 0  . tamsulosin (FLOMAX) 0.4 MG CAPS capsule Take 1 capsule by mouth daily to help release kidney stone. 30 capsule  0   No current facility-administered medications on file prior to visit.    BP 142/90 mmHg  Pulse 86  Temp(Src) 98.2 F (36.8 C) (Oral)  Wt 183 lb 8 oz (83.235 kg)  SpO2 98%  LMP 02/08/2015    Objective:   Physical Exam  Constitutional: She appears well-nourished.  HENT:  Right Ear: Tympanic membrane and ear canal normal.  Left Ear: Tympanic membrane and ear canal normal.  Nose: Mucosal edema present. Right sinus exhibits no maxillary sinus tenderness and no frontal sinus tenderness. Left sinus exhibits no maxillary sinus tenderness and no frontal sinus tenderness.  Mouth/Throat: Oropharynx is clear and moist.  Eyes: Conjunctivae are normal.  Neck: Neck supple.  Cardiovascular: Normal rate and regular rhythm.    Pulmonary/Chest: Effort normal and breath sounds normal. She has no wheezes. She has no rales.  Lymphadenopathy:    She has no cervical adenopathy.  Skin: Skin is warm and dry.          Assessment & Plan:  URI:  Cough, fatigue, sore throat, nasal congestion, sneezing x 5 days. Exam with clear lungs, nasal mucosal edema, otherwise unremarkable. Suspect viral/allergy involvement at this point and will treat with conservative measures. Switch to Flonase, start Zyrtec at bedtime. Rx for Tessalon pearls, Tussionex PRN HS, prednisone for nasal congestion and sinus pressure. Increase consumption of fluids and rest. Return precautions provided.

## 2015-06-06 ENCOUNTER — Ambulatory Visit (INDEPENDENT_AMBULATORY_CARE_PROVIDER_SITE_OTHER): Payer: Commercial Managed Care - HMO | Admitting: Primary Care

## 2015-06-06 ENCOUNTER — Encounter: Payer: Self-pay | Admitting: Primary Care

## 2015-06-06 VITALS — BP 124/76 | HR 82 | Temp 98.1°F | Ht 62.0 in | Wt 182.0 lb

## 2015-06-06 DIAGNOSIS — J302 Other seasonal allergic rhinitis: Secondary | ICD-10-CM

## 2015-06-06 DIAGNOSIS — J309 Allergic rhinitis, unspecified: Secondary | ICD-10-CM | POA: Insufficient documentation

## 2015-06-06 MED ORDER — MONTELUKAST SODIUM 10 MG PO TABS: 10.0000 mg | ORAL_TABLET | Freq: Every day | ORAL | Status: DC

## 2015-06-06 NOTE — Patient Instructions (Signed)
Start Singulair 10 mg tablets for congestion, runny nose, throat drainage. Take 1 tablet by mouth every night at bedtime.  Stop taking Zyrtec. Continue Nasacort nasal spray.   Please notify me if no improvement in symptoms in 3-4 weeks.  Increase consumption of water intake and rest.  It was a pleasure to see you today!

## 2015-06-06 NOTE — Assessment & Plan Note (Signed)
History of for years, typically in spring and fall. Last visit with URI symptoms that have mostly dissipated. Today symptoms seem to be more allergy related. Does not appear acutely ill, exam unremarkable, lungs clear. Does not need antibiotic treatment. Will have her start Singulair every night at bedtime as symptoms have been recurrent. If no improvement will consider spirometry.

## 2015-06-06 NOTE — Progress Notes (Signed)
Subjective:    Patient ID: Abigail [name], female    DOB: [birth year], 50 y.o.   MRN: [id number]  HPI  Abigail [name] is a 50 year old female who presents today with a chief complaint of nasal congestion and sinus pressure. She was last evaluated on 05/31/15 with a 5 day history of cough with nasal congestion, body aches, and fatigue. Her symptoms were suspected to be related to a viral infection based off examination and presentation. She was provided with supportive treatment including Tussionex, Tessalon pearls, and prednisone.  Since her last visit her cough has improved and she's feeling better. Her main complaint today is sinus pressure that has been present since symptoms began on 03/19.. She's noticed a change to the mucous in her nasal cavity from clear, yellow, to green.   She's been taking the tessalon pearls, nasaquart, prednisone, inhaler, advil and allergy, and Zyrtec with improvement. Overall she's feeling well, denies fevers, fatigue, nausea.   She does have a history of allergic rhinitis and will get these symptoms every year. She is currently taking Zyrtec at bedtime for the past 1 week.  Review of Systems  Constitutional: Negative for fever, chills and fatigue.  HENT: Positive for congestion and sinus pressure.   Respiratory: Positive for cough. Negative for shortness of breath.   Cardiovascular: Negative for chest pain.  Allergic/Immunologic: Positive for environmental allergies.  Neurological: Negative for dizziness.       Past Medical History  Diagnosis Date  . Heart disease   . Kidney stones   . GERD (gastroesophageal reflux disease)   . Frequent headaches   . Cardiac arrhythmia due to congenital heart disease   . Migraine     Social History   Social History  . Marital Status: Single    Spouse Name: N/A  . Number of Children: N/A  . Years of Education: N/A   Occupational History  . Not on file.   Social History Main Topics  . Smoking status:  Never Smoker   . Smokeless tobacco: Not on file  . Alcohol Use: No  . Drug Use: No  . Sexual Activity: Not on file   Other Topics Concern  . Not on file   Social History Narrative   Single.   Has a set of Twins, age 50.   Works for the [employer] and [employer] and [employer]   Enjoys relaxing, spending time with her children.    Past Surgical History  Procedure Laterality Date  . Abdominal exploration surgery  1986  . Tubal ligation    . Nasal sinus surgery      Family History  Problem Relation Age of Onset  . COPD Father   . Emphysema Father     No Known Allergies  Current Outpatient Prescriptions on File Prior to Visit  Medication Sig Dispense Refill  . albuterol (PROVENTIL HFA;VENTOLIN HFA) 108 (90 Base) MCG/ACT inhaler Inhale 1-2 puffs into the lungs every 6 (six) hours as needed for wheezing or shortness of breath (cough). 1 Inhaler 0  . ALPRAZolam (XANAX) 0.5 MG tablet Take 1 tablet (0.5 mg total) by mouth 2 (two) times daily as needed for anxiety. 60 tablet 0  . aspirin 81 MG tablet Take 81 mg by mouth daily.    . benzonatate (TESSALON) 200 MG capsule Take 1 capsule (200 mg total) by mouth 3 (three) times daily as needed for cough. 30 capsule 0  . chlorpheniramine-HYDROcodone (TUSSIONEX PENNKINETIC ER) 10-8 MG/5ML SUER Take 5  mLs by mouth every 12 (twelve) hours as needed for cough. 100 mL 0  . Chlorpheniramine-PSE-Ibuprofen 2-30-200 MG TABS Take 1-2 tablets by mouth every 6 (six) hours as needed (for cold symptoms).    [name] esomeprazole (NEXIUM) 40 MG capsule Take 1 capsule (40 mg total) by mouth daily. 30 capsule 5  . FLUoxetine (PROZAC) 40 MG capsule Take 1 capsule (40 mg total) by mouth daily. 30 capsule 3  . HYDROcodone-acetaminophen (NORCO/VICODIN) 5-325 MG tablet Take 1-2 tablets by mouth every 8 (eight) hours as needed for severe pain. 60 tablet 0  . ibuprofen (ADVIL,MOTRIN) 200 MG tablet Take 400 mg by mouth every 6 (six) hours as needed for pain or  headache.    . medroxyPROGESTERone (PROVERA) 10 MG tablet Take 10 mg by mouth daily.    . metoprolol tartrate (LOPRESSOR) 25 MG tablet Take 0.5 tablets (12.5 mg total) by mouth daily. 90 tablet 0  . tamsulosin (FLOMAX) 0.4 MG CAPS capsule Take 1 capsule by mouth daily to help release kidney stone. 30 capsule 0   No current facility-administered medications on file prior to visit.    BP 124/76 mmHg  Pulse 82  Temp(Src) 98.1 F (36.7 C) (Oral)  Ht 5' 2"  (1.575 m)  Wt 182 lb (82.555 kg)  BMI 33.28 kg/m2  SpO2 98%  LMP 02/08/2015    Objective:   Physical Exam  Constitutional: She appears well-nourished.  HENT:  Right Ear: Tympanic membrane and ear canal normal.  Left Ear: Tympanic membrane and ear canal normal.  Nose: Mucosal edema present. Right sinus exhibits no maxillary sinus tenderness and no frontal sinus tenderness. Left sinus exhibits no maxillary sinus tenderness and no frontal sinus tenderness.  Mouth/Throat: Oropharynx is clear and moist.  Neck: Neck supple.  Cardiovascular: Normal rate and regular rhythm.   Pulmonary/Chest: Effort normal and breath sounds normal. She has no wheezes. She has no rales.  Skin: Skin is warm and dry.          Assessment & Plan:

## 2015-06-06 NOTE — Progress Notes (Signed)
Pre visit review using our clinic review tool, if applicable. No additional management support is needed unless otherwise documented below in the visit note. 

## 2015-06-19 ENCOUNTER — Encounter: Payer: Self-pay | Admitting: Primary Care

## 2015-06-19 ENCOUNTER — Ambulatory Visit (INDEPENDENT_AMBULATORY_CARE_PROVIDER_SITE_OTHER): Payer: Commercial Managed Care - HMO | Admitting: Primary Care

## 2015-06-19 VITALS — BP 126/80 | HR 89 | Temp 98.2°F | Ht 62.0 in | Wt 181.8 lb

## 2015-06-19 DIAGNOSIS — R102 Pelvic and perineal pain: Secondary | ICD-10-CM

## 2015-06-19 DIAGNOSIS — N9489 Other specified conditions associated with female genital organs and menstrual cycle: Secondary | ICD-10-CM | POA: Diagnosis not present

## 2015-06-19 LAB — COMPREHENSIVE METABOLIC PANEL
ALBUMIN: 4 g/dL (ref 3.5–5.2)
ALT: 30 U/L (ref 0–35)
AST: 18 U/L (ref 0–37)
Alkaline Phosphatase: 84 U/L (ref 39–117)
BUN: 12 mg/dL (ref 6–23)
CALCIUM: 9.7 mg/dL (ref 8.4–10.5)
CHLORIDE: 104 meq/L (ref 96–112)
CO2: 31 meq/L (ref 19–32)
CREATININE: 0.68 mg/dL (ref 0.40–1.20)
GFR: 97.17 mL/min (ref >60.00)
Glucose, Bld: 93 mg/dL (ref 70–99)
POTASSIUM: 4.5 meq/L (ref 3.5–5.1)
Sodium: 141 mEq/L (ref 135–145)
Total Bilirubin: 0.7 mg/dL (ref 0.2–1.2)
Total Protein: 6.9 g/dL (ref 6.0–8.3)

## 2015-06-19 LAB — CBC WITH DIFFERENTIAL/PLATELET
BASOS ABS: 0 10*3/uL (ref 0.0–0.1)
BASOS PCT: 0.4 % (ref 0.0–3.0)
EOS PCT: 3.9 % (ref 0.0–5.0)
Eosinophils Absolute: 0.3 10*3/uL (ref 0.0–0.7)
HEMATOCRIT: 40.5 % (ref 36.0–46.0)
Hemoglobin: 13.6 g/dL (ref 12.0–15.0)
LYMPHS PCT: 26.9 % (ref 12.0–46.0)
Lymphs Abs: 2.4 10*3/uL (ref 0.7–4.0)
MCHC: 33.6 g/dL (ref 30.0–36.0)
MCV: 84.5 fl (ref 78.0–100.0)
MONOS PCT: 6 % (ref 3.0–12.0)
Monocytes Absolute: 0.5 10*3/uL (ref 0.1–1.0)
NEUTROS ABS: 5.5 10*3/uL (ref 1.4–7.7)
Neutrophils Relative %: 62.8 % (ref 43.0–77.0)
PLATELETS: 299 10*3/uL (ref 150.0–400.0)
RBC: 4.79 Mil/uL (ref 3.87–5.11)
RDW: 15.1 % (ref 11.5–15.5)
WBC: 8.8 10*3/uL (ref 4.0–10.5)

## 2015-06-19 LAB — POC URINALSYSI DIPSTICK (AUTOMATED)
Bilirubin, UA: NEGATIVE
Blood, UA: NEGATIVE
GLUCOSE UA: NEGATIVE
Ketones, UA: NEGATIVE
LEUKOCYTES UA: NEGATIVE
NITRITE UA: NEGATIVE
PROTEIN UA: NEGATIVE
SPEC GRAV UA: 1.02
UROBILINOGEN UA: NEGATIVE
pH, UA: 5

## 2015-06-19 LAB — POCT URINE PREGNANCY: PREG TEST UR: NEGATIVE

## 2015-06-19 NOTE — Patient Instructions (Signed)
Complete lab work prior to leaving today. I will notify you of your results once received.   I will notify you of your vaginal specimen once we receive the results.   Stop by the front desk and speak with either Abigail [name] or Abigail [name] regarding your Ultrasound.  It was a pleasure to see you today!

## 2015-06-19 NOTE — Progress Notes (Signed)
Pre visit review using our clinic review tool, if applicable. No additional management support is needed unless otherwise documented below in the visit note. 

## 2015-06-19 NOTE — Progress Notes (Signed)
Subjective:    Patient ID: Abigail [name], female    DOB: [birth year], 50 y.o.   MRN: [id number]  HPI  Abigail [name] is a 50 year old female who presents today with a chief complaint of pelvic discomfort.he also reports urinary frequency, difficulty emptying her bladder.  Her pressure is located bilaterally and first noticed it Friday last week. She describes her pain as a "pulling" sensation. She is also experiencing bloating and nausea. Denies diarrhea, constipation, fevers, vomiting, vaginal discharge, vaginal itching. Her last period was in December 2016. She's taken advil and Hydrocodone without improvement. She did have unprotected intercourse 1 week ago.   Review of Systems  Constitutional: Negative for fever.  Gastrointestinal: Positive for nausea. Negative for abdominal pain.  Genitourinary: Positive for frequency, difficulty urinating and pelvic pain. Negative for dysuria, flank pain and vaginal discharge.       Past Medical History  Diagnosis Date  . Heart disease   . Kidney stones   . GERD (gastroesophageal reflux disease)   . Frequent headaches   . Cardiac arrhythmia due to congenital heart disease   . Migraine     Social History   Social History  . Marital Status: Single    Spouse Name: N/A  . Number of Children: N/A  . Years of Education: N/A   Occupational History  . Not on file.   Social History Main Topics  . Smoking status: Never Smoker   . Smokeless tobacco: Not on file  . Alcohol Use: No  . Drug Use: No  . Sexual Activity: Not on file   Other Topics Concern  . Not on file   Social History Narrative   Single.   Has a set of Twins, age 69.   Works for the [employer] and [employer] and [employer]   Enjoys relaxing, spending time with her children.    Past Surgical History  Procedure Laterality Date  . Abdominal exploration surgery  1986  . Tubal ligation    . Nasal sinus surgery      Family History  Problem Relation Age of  Onset  . COPD Father   . Emphysema Father     No Known Allergies  Current Outpatient Prescriptions on File Prior to Visit  Medication Sig Dispense Refill  . albuterol (PROVENTIL HFA;VENTOLIN HFA) 108 (90 Base) MCG/ACT inhaler Inhale 1-2 puffs into the lungs every 6 (six) hours as needed for wheezing or shortness of breath (cough). 1 Inhaler 0  . ALPRAZolam (XANAX) 0.5 MG tablet Take 1 tablet (0.5 mg total) by mouth 2 (two) times daily as needed for anxiety. 60 tablet 0  . Chlorpheniramine-PSE-Ibuprofen 2-30-200 MG TABS Take 1-2 tablets by mouth every 6 (six) hours as needed (for cold symptoms).    [name] esomeprazole (NEXIUM) 40 MG capsule Take 1 capsule (40 mg total) by mouth daily. 30 capsule 5  . FLUoxetine (PROZAC) 40 MG capsule Take 1 capsule (40 mg total) by mouth daily. 30 capsule 3  . ibuprofen (ADVIL,MOTRIN) 200 MG tablet Take 400 mg by mouth every 6 (six) hours as needed for pain or headache.    . metoprolol tartrate (LOPRESSOR) 25 MG tablet Take 0.5 tablets (12.5 mg total) by mouth daily. 90 tablet 0  . montelukast (SINGULAIR) 10 MG tablet Take 1 tablet (10 mg total) by mouth at bedtime. 30 tablet 3  . aspirin 81 MG tablet Take 81 mg by mouth daily.    [name] HYDROcodone-acetaminophen (NORCO/VICODIN) 5-325 MG tablet Take 1-2  tablets by mouth every 8 (eight) hours as needed for severe pain. (Patient not taking: Reported on 06/19/2015) 60 tablet 0  . medroxyPROGESTERone (PROVERA) 10 MG tablet Take 10 mg by mouth daily. Reported on 06/19/2015    . tamsulosin (FLOMAX) 0.4 MG CAPS capsule Take 1 capsule by mouth daily to help release kidney stone. (Patient not taking: Reported on 06/19/2015) 30 capsule 0   No current facility-administered medications on file prior to visit.    BP 126/80 mmHg  Pulse 89  Temp(Src) 98.2 F (36.8 C) (Oral)  Ht 5' 2"  (1.575 m)  Wt 181 lb 12.8 oz (82.464 kg)  BMI 33.24 kg/m2  SpO2 98%  LMP 02/08/2015    Objective:   Physical Exam  Constitutional: She  appears well-nourished.  Cardiovascular: Normal rate and regular rhythm.   Pulmonary/Chest: Effort normal and breath sounds normal.  Abdominal: There is tenderness in the suprapubic area. There is no CVA tenderness, no tenderness at McBurney's point and negative Murphy's sign.  Genitourinary: Vagina normal. Cervix exhibits discharge. Cervix exhibits no motion tenderness. Right adnexum displays tenderness. Left adnexum displays tenderness.  Skin: Skin is warm and dry.          Assessment & Plan:  Pelvic Pressure:  Present for 6 days, unprotected intercourse 6 days ago. Exam with moderate tenderness upon palpation to suprapubic region, and patient in moderate distress with movement.  UA: Negative. Urine Pregnancy: Negative. Pelvic Exam: Mild whitish discharge, no CMT, moderate tenderness upon exam to pelvic region. CBC, CMP: unremarkable. Wet prep: Pending. Pelvic US ordered for further evaluation, could be ovarian cysts. Known uterine fibroid. Return precautions provided.

## 2015-06-20 LAB — WET PREP BY MOLECULAR PROBE
Candida species: NEGATIVE
Gardnerella vaginalis: NEGATIVE
Trichomonas vaginosis: NEGATIVE

## 2015-06-21 ENCOUNTER — Other Ambulatory Visit: Payer: Commercial Managed Care - HMO

## 2015-06-24 ENCOUNTER — Ambulatory Visit
Admission: RE | Admit: 2015-06-24 | Discharge: 2015-06-24 | Disposition: A | Discharge status: Home / Self Care | Payer: Commercial Managed Care - HMO | Source: Ambulatory Visit | Attending: Primary Care | Admitting: Primary Care

## 2015-06-24 DIAGNOSIS — R102 Pelvic and perineal pain: Secondary | ICD-10-CM

## 2015-07-02 ENCOUNTER — Other Ambulatory Visit: Payer: Self-pay | Admitting: Primary Care

## 2015-07-02 DIAGNOSIS — K219 Gastro-esophageal reflux disease without esophagitis: Secondary | ICD-10-CM

## 2015-07-04 ENCOUNTER — Other Ambulatory Visit: Payer: Self-pay | Admitting: Primary Care

## 2015-07-26 ENCOUNTER — Other Ambulatory Visit: Payer: Self-pay

## 2015-07-26 DIAGNOSIS — F419 Anxiety disorder, unspecified: Secondary | ICD-10-CM

## 2015-07-26 DIAGNOSIS — F329 Major depressive disorder, single episode, unspecified: Secondary | ICD-10-CM

## 2015-07-26 DIAGNOSIS — F411 Generalized anxiety disorder: Secondary | ICD-10-CM

## 2015-07-26 DIAGNOSIS — F32A Depression, unspecified: Secondary | ICD-10-CM

## 2015-07-26 MED ORDER — FLUOXETINE HCL 40 MG PO CAPS: 40.0000 mg | ORAL_CAPSULE | Freq: Every day | ORAL | Status: DC

## 2015-07-26 NOTE — Telephone Encounter (Signed)
Pt left vm requesting refill alprazolam (last refilled # 60 on 02/08/15) and fluoxetine (last refilled # 30 x 3 on 10/23/14.) Pt seen 02/08/15 and chest pain on 04/11/15.Please advise. [facility] [address]

## 2015-08-01 ENCOUNTER — Institutional Professional Consult (permissible substitution): Payer: Commercial Managed Care - HMO | Admitting: Internal Medicine

## 2015-08-29 ENCOUNTER — Telehealth: Payer: Self-pay | Admitting: Primary Care

## 2015-08-29 ENCOUNTER — Other Ambulatory Visit: Payer: Self-pay

## 2015-08-29 DIAGNOSIS — F411 Generalized anxiety disorder: Secondary | ICD-10-CM

## 2015-08-29 NOTE — Telephone Encounter (Signed)
Pt left v/m requesting refill alprazolam; last refilled # 60 on 02/08/2015; pt last seen 04/11/15. Pt having anxiety issues.

## 2015-08-29 NOTE — Telephone Encounter (Signed)
Please notify patient that I will no longer be refilling her alprazolam. If she is having anxiety issues and she needs to follow-up in our office for reevaluation.

## 2015-08-29 NOTE — Telephone Encounter (Signed)
Message left for patient to return my call.  

## 2015-08-30 NOTE — Telephone Encounter (Signed)
Spoken and notified patient of Kate's comments. Patient verbalized understanding. 

## 2015-09-02 ENCOUNTER — Ambulatory Visit (INDEPENDENT_AMBULATORY_CARE_PROVIDER_SITE_OTHER): Payer: Commercial Managed Care - HMO | Admitting: Primary Care

## 2015-09-02 ENCOUNTER — Encounter: Payer: Self-pay | Admitting: Primary Care

## 2015-09-02 VITALS — BP 120/84 | HR 73 | Temp 98.1°F | Ht 62.0 in | Wt 183.4 lb

## 2015-09-02 DIAGNOSIS — F329 Major depressive disorder, single episode, unspecified: Secondary | ICD-10-CM

## 2015-09-02 DIAGNOSIS — F418 Other specified anxiety disorders: Secondary | ICD-10-CM

## 2015-09-02 DIAGNOSIS — N9489 Other specified conditions associated with female genital organs and menstrual cycle: Secondary | ICD-10-CM | POA: Diagnosis not present

## 2015-09-02 DIAGNOSIS — F419 Anxiety disorder, unspecified: Principal | ICD-10-CM

## 2015-09-02 DIAGNOSIS — F32A Depression, unspecified: Secondary | ICD-10-CM

## 2015-09-02 DIAGNOSIS — R102 Pelvic and perineal pain: Secondary | ICD-10-CM

## 2015-09-02 LAB — POC URINALSYSI DIPSTICK (AUTOMATED)
Bilirubin, UA: NEGATIVE
Blood, UA: NEGATIVE
Ketones, UA: NEGATIVE
LEUKOCYTES UA: NEGATIVE
NITRITE UA: NEGATIVE
PH UA: 6
PROTEIN UA: NEGATIVE
Spec Grav, UA: >=1.03
Urobilinogen, UA: NEGATIVE

## 2015-09-02 MED ORDER — HYDROXYZINE HCL 25 MG PO TABS: 25.0000 mg | ORAL_TABLET | Freq: Two times a day (BID) | ORAL | Status: DC | PRN

## 2015-09-02 NOTE — Progress Notes (Signed)
Subjective:    Patient ID: Abigail [name], female    DOB: 11-04-1965, 50 y.o.   MRN: [id number]  HPI  [name] is a 50 year old female who presents today with a chief complaint of anxiety. She has a history of anxiety and depression and is currently managed on Fluoxetine 40 mg. She was once managed on alprazolam but discussed in the past to wean off due to the potential for addiction. We also discussed the possibility of adding in additional medication if her anxiety was not well managed.  Her co-coworkers have noticed more irritability, depressed behavoir. She's experiencing flash backs of her deceased son. Over the past several weeks she's feeling more tired and without energy and has noticed episodes of stress with shaking.   Denies SI/HI, panic attacks. She was once managed on Paxil and Effexor in the past without improvement. She is not interested in therapy at this time.  2) Pelvic Pressure: Present intermittently for months. Has had evaluation by GYN and was told that her urine was acidic causing irritation to the labia. She has a prescription for estrace cream per GYN that helps with vaginal irritation. Denies urinary frequency, hematuria, vaginal discharge. She just wants to ensure she doesn't have a urinary tract infection.  Review of Systems  Respiratory: Negative for shortness of breath.   Cardiovascular: Negative for chest pain.  Genitourinary: Negative for dysuria, hematuria, flank pain, vaginal discharge and pelvic pain.  Neurological: Negative for headaches.  Psychiatric/Behavioral: Positive for sleep disturbance. Negative for suicidal ideas. The patient is nervous/anxious.        Past Medical History  Diagnosis Date  . Heart disease   . Kidney stones   . GERD (gastroesophageal reflux disease)   . Frequent headaches   . Cardiac arrhythmia due to congenital heart disease   . Migraine      Social History   Social History  . Marital Status: Single   Spouse Name: N/A  . Number of Children: N/A  . Years of Education: N/A   Occupational History  . Not on file.   Social History Main Topics  . Smoking status: Never Smoker   . Smokeless tobacco: Not on file  . Alcohol Use: No  . Drug Use: No  . Sexual Activity: Not on file   Other Topics Concern  . Not on file   Social History Narrative   Single.   Has a set of Twins, age 39.   Works for the [employer] and [employer] and [employer]   Enjoys relaxing, spending time with her children.    Past Surgical History  Procedure Laterality Date  . Abdominal exploration surgery  1986  . Tubal ligation    . Nasal sinus surgery      Family History  Problem Relation Age of Onset  . COPD Father   . Emphysema Father     No Known Allergies  Current Outpatient Prescriptions on File Prior to Visit  Medication Sig Dispense Refill  . albuterol (PROVENTIL HFA;VENTOLIN HFA) 108 (90 Base) MCG/ACT inhaler Inhale 1-2 puffs into the lungs every 6 (six) hours as needed for wheezing or shortness of breath (cough). 1 Inhaler 0  . aspirin 81 MG tablet Take 81 mg by mouth daily.    . Chlorpheniramine-PSE-Ibuprofen 2-30-200 MG TABS Take 1-2 tablets by mouth every 6 (six) hours as needed (for cold symptoms).    [name] esomeprazole (NEXIUM) 40 MG capsule TAKE ONE CAPSULE BY MOUTH ONCE DAILY 30 capsule  0  . FLUoxetine (PROZAC) 40 MG capsule Take 1 capsule (40 mg total) by mouth daily. 90 capsule 1  . ibuprofen (ADVIL,MOTRIN) 200 MG tablet Take 400 mg by mouth every 6 (six) hours as needed for pain or headache.    . medroxyPROGESTERone (PROVERA) 10 MG tablet Take 10 mg by mouth daily. Reported on 06/19/2015    . metoprolol tartrate (LOPRESSOR) 25 MG tablet Take 0.5 tablets (12.5 mg total) by mouth daily. 90 tablet 0  . montelukast (SINGULAIR) 10 MG tablet Take 1 tablet (10 mg total) by mouth at bedtime. 30 tablet 3  . tamsulosin (FLOMAX) 0.4 MG CAPS capsule Take 1 capsule by mouth daily to help release  kidney stone. 30 capsule 0  . HYDROcodone-acetaminophen (NORCO/VICODIN) 5-325 MG tablet Take 1-2 tablets by mouth every 8 (eight) hours as needed for severe pain. (Patient not taking: Reported on 09/02/2015) 60 tablet 0   No current facility-administered medications on file prior to visit.    BP 120/84 mmHg  Pulse 73  Temp(Src) 98.1 F (36.7 C) (Oral)  Ht 5' 2"  (1.575 m)  Wt 183 lb 6.4 oz (83.19 kg)  BMI 33.54 kg/m2  SpO2 97%  LMP 08/12/2015    Objective:   Physical Exam  Constitutional: She appears well-nourished.  Cardiovascular: Normal rate and regular rhythm.   Pulmonary/Chest: Effort normal and breath sounds normal.  Abdominal: There is no tenderness. There is no CVA tenderness.  Skin: Skin is warm and dry.  Psychiatric: She has a normal mood and affect.          Assessment & Plan:  Pelvic pressure:  Currently under evaluation with GYN. She is just going to rule out urinary tract infection given her symptom. Denies any other urinary symptoms, fevers, abdominal pain. UA: Negative Suggested she continue to follow with GYN for further evaluation.

## 2015-09-02 NOTE — Progress Notes (Signed)
Pre visit review using our clinic review tool, if applicable. No additional management support is needed unless otherwise documented below in the visit note. 

## 2015-09-02 NOTE — Patient Instructions (Signed)
I recommend meeting with one of our therapists as I believe it would be very beneficial. Please notify me once you've made a decision.  You may try hydroxyzine tablets as needed for anxiety and/or sleep. Take 1 tablet by mouth no more than twice daily. Caution as this may cause drowsiness.  Start taking the Fluoxetine tablets in the morning rather than at night.  Your urine did not show evidence of infection.  It was a pleasure to see you today!  I'd like to see you in 4 months for re-evaluation of anxiety and depression.  It was a pleasure to see you today!

## 2015-09-02 NOTE — Assessment & Plan Note (Signed)
Increased anxiety and depression over the last several months. Currently managed on fluoxetine 40 mg and is requesting refill of Xanax. Discussed that we will not continue to refill Xanax at this time and will switch to hydroxyzine to use as needed.  She declines therapy and the addition of any other medication to help with her symptoms. Denies SI/HI. Will have her follow-up in 3-4 months for reevaluation to ensure this is well managed.

## 2015-10-22 ENCOUNTER — Other Ambulatory Visit: Payer: Self-pay | Admitting: Primary Care

## 2015-10-22 DIAGNOSIS — R002 Palpitations: Secondary | ICD-10-CM

## 2015-10-22 NOTE — Telephone Encounter (Signed)
Ok to refill? Electronically refill request for   metoprolol tartrate (LOPRESSOR) 25 MG tablet Take 0.5 tablets (12.5 mg total) by mouth daily.  Last prescribed on 04/11/2015. Last seen on 09/02/2015. No future appt.

## 2015-11-28 ENCOUNTER — Other Ambulatory Visit: Payer: Self-pay | Admitting: Orthopaedic Surgery

## 2015-11-28 DIAGNOSIS — Q762 Congenital spondylolisthesis: Secondary | ICD-10-CM

## 2015-12-09 ENCOUNTER — Ambulatory Visit
Admission: RE | Admit: 2015-12-09 | Discharge: 2015-12-09 | Disposition: A | Discharge status: Home / Self Care | Payer: Commercial Managed Care - HMO | Source: Ambulatory Visit | Attending: Orthopaedic Surgery | Admitting: Orthopaedic Surgery

## 2015-12-09 DIAGNOSIS — Q762 Congenital spondylolisthesis: Secondary | ICD-10-CM

## 2016-02-12 ENCOUNTER — Encounter (HOSPITAL_COMMUNITY): Payer: Self-pay | Admitting: Emergency Medicine

## 2016-02-12 ENCOUNTER — Emergency Department (HOSPITAL_COMMUNITY)
Admission: EM | Admit: 2016-02-12 | Discharge: 2016-02-12 | Disposition: A | Discharge status: Home / Self Care | Payer: Commercial Managed Care - HMO | Attending: Emergency Medicine | Admitting: Emergency Medicine

## 2016-02-12 ENCOUNTER — Emergency Department (HOSPITAL_COMMUNITY): Payer: Commercial Managed Care - HMO

## 2016-02-12 DIAGNOSIS — Z79899 Other long term (current) drug therapy: Secondary | ICD-10-CM | POA: Diagnosis not present

## 2016-02-12 DIAGNOSIS — Z87891 Personal history of nicotine dependence: Secondary | ICD-10-CM | POA: Diagnosis not present

## 2016-02-12 DIAGNOSIS — R0789 Other chest pain: Secondary | ICD-10-CM

## 2016-02-12 DIAGNOSIS — R002 Palpitations: Secondary | ICD-10-CM

## 2016-02-12 DIAGNOSIS — R079 Chest pain, unspecified: Secondary | ICD-10-CM | POA: Diagnosis present

## 2016-02-12 DIAGNOSIS — Z7982 Long term (current) use of aspirin: Secondary | ICD-10-CM | POA: Insufficient documentation

## 2016-02-12 LAB — URINALYSIS, ROUTINE W REFLEX MICROSCOPIC
BILIRUBIN URINE: NEGATIVE
Bacteria, UA: NONE SEEN
GLUCOSE, UA: 50 mg/dL — AB
HGB URINE DIPSTICK: NEGATIVE
Ketones, ur: NEGATIVE mg/dL
LEUKOCYTES UA: NEGATIVE
NITRITE: NEGATIVE
PH: 6 (ref 5.0–8.0)
Protein, ur: 30 mg/dL — AB
SPECIFIC GRAVITY, URINE: 1.02 (ref 1.005–1.030)

## 2016-02-12 LAB — CBC
HCT: 40.7 % (ref 36.0–46.0)
HEMOGLOBIN: 13.6 g/dL (ref 12.0–15.0)
MCH: 28.5 pg (ref 26.0–34.0)
MCHC: 33.4 g/dL (ref 30.0–36.0)
MCV: 85.3 fL (ref 78.0–100.0)
PLATELETS: 334 10*3/uL (ref 150–400)
RBC: 4.77 MIL/uL (ref 3.87–5.11)
RDW: 14.1 % (ref 11.5–15.5)
WBC: 20.1 10*3/uL — AB (ref 4.0–10.5)

## 2016-02-12 LAB — BASIC METABOLIC PANEL
ANION GAP: 11 (ref 5–15)
BUN: 12 mg/dL (ref 6–20)
CALCIUM: 9.4 mg/dL (ref 8.9–10.3)
CO2: 22 mmol/L (ref 22–32)
CREATININE: 0.7 mg/dL (ref 0.44–1.00)
Chloride: 104 mmol/L (ref 101–111)
Glucose, Bld: 166 mg/dL — ABNORMAL HIGH (ref 65–99)
Potassium: 4.3 mmol/L (ref 3.5–5.1)
SODIUM: 137 mmol/L (ref 135–145)

## 2016-02-12 LAB — I-STAT TROPONIN, ED: TROPONIN I, POC: 0 ng/mL (ref 0.00–0.08)

## 2016-02-12 MED ORDER — HYDROXYZINE HCL 25 MG PO TABS
25.0000 mg | ORAL_TABLET | Freq: Once | ORAL | Status: AC
  Administered 2016-02-12: 25 mg via ORAL
  Filled 2016-02-12: qty 1

## 2016-02-12 NOTE — ED Triage Notes (Addendum)
[facility] and gets epidural injections for bulging discs.   Patient states that she has had a racing heart with chest tightness in central chest with radiation to L arm and around to between shoulder blades.   Patient states she is very tired and has not been able to sleep well.   Patient declines dizziness or shortness of breath.  Patient does states she has had some nausea.  Patient had gone to [place] to get checked out and they called EMS.   Patient was given 1 x NTG and ASA 352m by E MS.

## 2016-02-12 NOTE — Discharge Instructions (Signed)
You were seen in the emergency department with chest pain and palpitations. We ran some tests, which did not show that you were having a heart attack. We think all of your symptoms were probably caused by the steroid injections, because steroids can sometimes cause people to feel wired.   If you have new or worsening chest pain or palpitations, please call your primary doctor or come back to the emergency department.

## 2016-02-12 NOTE — ED Notes (Signed)
Patient undressed, in gown, on monitor, continuous pulse oximetry and blood pressure cuff

## 2016-02-12 NOTE — ED Provider Notes (Signed)
[facility] DEPT Provider Note   CSN: [id number] Arrival date & time: 02/12/16  0908     History   Chief Complaint Chief Complaint  Patient presents with  . Chest Pain    HPI Abigail [name] is a 50 y.o. female with a past medical history of sinus tachycardia, ?congenital heart disease, GERD, anxiety, chronic back pain presenting to the ED with chest pain that started at [facility] yesterday. Yes[facility] morning [facility]. She went home and took a nap after receiving the injections. When she woke up she noted palpitations and feeling restless. She then started to have central chest pain. The chest pain has been constant since then. The pain feels like a "discomfort" and "tightness". The pain is worse with movement and exertion. The pain is better with rest. The pain radiates to the left breast. She has associated nausea and cold chills. She denies diaphoresis. She does have shortness of breath with movement. Since waking up from her nap, she has been restless and hasn't been able to sleep. She didn't sleep at all last night. She feels like she is "on crack". This morning, she went to the fire department. She had her blood pressure checked which was 200/118. She was given 4 aspirin and was brought to the emergency department.  Of note, she had a negative cardiac cath in 2012. She also states that in October of this year, she had the same steroid injection in her back and after the injection, she had palpitations, restlessness, and this same chest pain. She was never evaluated for this. The symptoms resolved on their own.  No recent fevers, congestion, sore throat, rhinorrhea.   HPI  Past Medical History:  Diagnosis Date  . Cardiac arrhythmia due to congenital heart disease   . Frequent headaches   . GERD (gastroesophageal reflux disease)   . Heart disease   . Kidney stones   . Migraine     Patient Active Problem List   Diagnosis Date Noted  . Allergic rhinitis 06/06/2015  . Palpitations 04/11/2015  . Neck pain 06/29/2014  . Right-sided low back pain with right-sided sciatica 06/29/2014  . Esophageal reflux 06/29/2014  . Obesity (BMI 30.0-34.9) 05/23/2013  . Normal coronary arteries- 2012 05/23/2013  . Suspected sleep apnea 05/23/2013  . Anxiety and depression 05/23/2013  . History of medication noncompliance 05/23/2013    Past Surgical History:  Procedure Laterality Date  . ABDOMINAL EXPLORATION SURGERY  1986  . NASAL SINUS SURGERY    . TUBAL LIGATION      OB History    No data available       Home Medications    Prior to Admission medications   Medication Sig Start Date End Date Taking? Authorizing Provider  albuterol (PROVENTIL HFA;VENTOLIN HFA) 108 (90 Base) MCG/ACT inhaler Inhale 1-2 puffs into the lungs every 6 (six) hours as needed for wheezing or shortness of breath (cough). 05/31/15   [name], NP  aspirin 81 MG tablet Take 81 mg by mouth daily.    Historical Provider, MD  Chlorpheniramine-PSE-Ibuprofen 2-30-200 MG TABS Take 1-2 tablets by mouth every 6 (six) hours as needed (for cold symptoms).    Historical Provider, MD  esomeprazole (NEXIUM) 40 MG capsule TAKE ONE CAPSULE BY MOUTH ONCE DAILY 07/02/15   [name], NP  FLUoxetine (PROZAC) 40 MG capsule Take 1 capsule (40 mg total) by mouth daily. 07/26/15   [name], NP  HYDROcodone-acetaminophen (  NORCO/VICODIN) 5-325 MG tablet Take 1-2 tablets by mouth every 8 (eight) hours as needed for severe pain. Patient not taking: Reported on 09/02/2015 02/08/15   [name], NP  hydrOXYzine (ATARAX/VISTARIL) 25 MG tablet Take 1 tablet (25 mg total) by mouth 2 (two) times daily as needed for anxiety. 09/02/15   [name], NP  ibuprofen (ADVIL,MOTRIN) 200 MG tablet Take 400 mg by mouth every 6 (six) hours as needed for pain or headache.    Historical Provider, MD  medroxyPROGESTERone (PROVERA) 10 MG tablet  Take 10 mg by mouth daily. Reported on 06/19/2015    Historical Provider, MD  metoprolol tartrate (LOPRESSOR) 25 MG tablet TAKE ONE-HALF TABLET BY MOUTH EVERY DAY 10/23/15   [name], NP  montelukast (SINGULAIR) 10 MG tablet Take 1 tablet (10 mg total) by mouth at bedtime. 06/06/15   [name], NP  tamsulosin (FLOMAX) 0.4 MG CAPS capsule Take 1 capsule by mouth daily to help release kidney stone. 12/21/14   [name], NP    Family History Family History  Problem Relation Age of Onset  . COPD Father   . Emphysema Father     Social History Social History  Substance Use Topics  . Smoking status: Former Smoker    Types: Cigarettes    Quit date: 2008  . Smokeless tobacco: Not on file     Comment: used tobacco "socially"  . Alcohol use No     Allergies   Patient has no known allergies.   Review of Systems Review of Systems  10 Systems reviewed and are negative for acute change except as noted in the HPI.  Physical Exam Updated Vital Signs BP 125/69   Pulse 82   Temp 98.1 F (36.7 C) (Oral)   Resp 18   Ht 5' 1"  (1.549 m)   Wt 83 kg   SpO2 97%   BMI 34.58 kg/m   Physical Exam  Constitutional: She is oriented to person, place, and time. She appears well-developed and well-nourished. No distress.  flushed  HENT:  Head: Normocephalic and atraumatic.  Nose: Nose normal.  Mouth/Throat: Oropharynx is clear and moist. No oropharyngeal exudate.  Eyes: EOM are normal. Pupils are equal, round, and reactive to light. No scleral icterus.  Neck: Normal range of motion. Neck supple.  Cardiovascular: Normal rate and regular rhythm.   No murmur heard. Pulmonary/Chest: Effort normal and breath sounds normal. No respiratory distress. She has no wheezes.  Sternum is tender to palpation  Abdominal: Soft. Bowel sounds are normal. She exhibits no distension. There is no tenderness.  Musculoskeletal: Normal range of motion. She exhibits no edema or deformity.    Lymphadenopathy:    She has no cervical adenopathy.  Neurological: She is alert and oriented to person, place, and time. No cranial nerve deficit. She exhibits normal muscle tone.  Skin: Skin is warm and dry. Capillary refill takes less than 2 seconds. No rash noted.  Psychiatric: She has a normal mood and affect. Her behavior is normal.     ED Treatments / Results  Labs (all labs ordered are listed, but only abnormal results are displayed) Labs Reviewed  CBC - Abnormal; Notable for the following:       Result Value   WBC 20.1 (*)    All other components within normal limits  BASIC METABOLIC PANEL  Randolm Idol, ED    EKG  EKG Interpretation  Date/Time:  Wednesday February 12 2016 09:13:44 EST Ventricular Rate:  97 PR Interval:    QRS Duration: 70 QT Interval:  361 QTC Calculation: 459 R Axis:   14 Text Interpretation:  Sinus rhythm Abnormal T, consider ischemia, lateral leads Baseline wander in lead(s) V2 T wave inversions in aVL, V5-V6 new from previous Confirmed by LITTLE MD, RACHEL ([id number]) on 02/12/2016 9:25:33 AM       Radiology No results found.  Procedures Procedures (including critical care time)  Medications Ordered in ED Medications - No data to display   Initial Impression / Assessment and Plan / ED Course  I have reviewed the triage vital signs and the nursing notes.  Pertinent labs & imaging results that were available during my care of the patient were reviewed by me and considered in my medical decision making (see chart for details).  Clinical Course    Abigail [name] is a 50 year old female presenting to the ED with chest pain and palpitations starting at [facility] yesterday a few hours after receiving fluoroscopic [facility]. She has been having palpitations, feeling restless, and has not slept. Some features are consistent with ACS, including centralized chest pain that is worse with movement and walking. However, she  also has some atypical features, including chest pain that is reproducible with palpation of the sternum. Initial EKG with T wave inversions in aVL, V5, V6; however on review of previous EKGs, some non-specific T wave changes were noted. She does not have many risk factors for ACS, no HTN, only used tobacco socially before quitting in 2008. She had a normal cardiac cath in 2012. No family history of MI at an early age. Think her symptoms are likely 2/2 systemic effects of steroids.   11:15AM: Trop 0.00. CXR clear. Labs normal except for a WBC 20.1, although Pt just received fluoroscopic spinal injections yesterday. No other signs of infection with a clear CXR and no fevers or URI symptoms. Will check a UA just in case, although Pt not having any symptoms.  12:20AM: UA negative for signs of infection. Think leukocytosis is likely 2/2 steroid injections. She is safe for discharge home.  Final Clinical Impressions(s) / ED Diagnoses   Final diagnoses:  None    New Prescriptions New Prescriptions   No medications on file     Sela Hua, MD 02/12/16 [place], MD 02/12/16 [phone]

## 2016-02-21 ENCOUNTER — Other Ambulatory Visit: Payer: Self-pay | Admitting: Primary Care

## 2016-02-21 DIAGNOSIS — K219 Gastro-esophageal reflux disease without esophagitis: Secondary | ICD-10-CM

## 2016-02-21 DIAGNOSIS — F329 Major depressive disorder, single episode, unspecified: Secondary | ICD-10-CM

## 2016-02-21 DIAGNOSIS — F419 Anxiety disorder, unspecified: Principal | ICD-10-CM

## 2016-02-21 DIAGNOSIS — F32A Depression, unspecified: Secondary | ICD-10-CM

## 2016-03-31 ENCOUNTER — Ambulatory Visit: Payer: Commercial Managed Care - HMO | Admitting: Primary Care

## 2016-04-02 ENCOUNTER — Encounter: Payer: Self-pay | Admitting: Primary Care

## 2016-04-02 ENCOUNTER — Telehealth: Payer: Self-pay

## 2016-04-02 ENCOUNTER — Ambulatory Visit
Admission: RE | Admit: 2016-04-02 | Discharge: 2016-04-02 | Disposition: A | Discharge status: Home / Self Care | Payer: Commercial Managed Care - HMO | Source: Ambulatory Visit | Attending: Primary Care | Admitting: Primary Care

## 2016-04-02 ENCOUNTER — Ambulatory Visit (INDEPENDENT_AMBULATORY_CARE_PROVIDER_SITE_OTHER): Payer: Commercial Managed Care - HMO | Admitting: Primary Care

## 2016-04-02 VITALS — BP 132/86 | HR 85 | Temp 98.0°F | Ht 62.0 in | Wt 195.4 lb

## 2016-04-02 DIAGNOSIS — R1011 Right upper quadrant pain: Secondary | ICD-10-CM

## 2016-04-02 LAB — CBC WITH DIFFERENTIAL/PLATELET
Basophils Absolute: 0 10*3/uL (ref 0.0–0.1)
Basophils Relative: 0.6 % (ref 0.0–3.0)
EOS ABS: 0.4 10*3/uL (ref 0.0–0.7)
Eosinophils Relative: 5.4 % — ABNORMAL HIGH (ref 0.0–5.0)
HCT: 41.2 % (ref 36.0–46.0)
HEMOGLOBIN: 14 g/dL (ref 12.0–15.0)
LYMPHS ABS: 1.8 10*3/uL (ref 0.7–4.0)
Lymphocytes Relative: 23 % (ref 12.0–46.0)
MCHC: 33.9 g/dL (ref 30.0–36.0)
MCV: 84.3 fl (ref 78.0–100.0)
MONO ABS: 0.4 10*3/uL (ref 0.1–1.0)
Monocytes Relative: 5.6 % (ref 3.0–12.0)
NEUTROS PCT: 65.4 % (ref 43.0–77.0)
Neutro Abs: 5.1 10*3/uL (ref 1.4–7.7)
Platelets: 290 10*3/uL (ref 150.0–400.0)
RBC: 4.89 Mil/uL (ref 3.87–5.11)
RDW: 14.7 % (ref 11.5–15.5)
WBC: 7.8 10*3/uL (ref 4.0–10.5)

## 2016-04-02 LAB — COMPREHENSIVE METABOLIC PANEL
ALBUMIN: 4 g/dL (ref 3.5–5.2)
ALK PHOS: 94 U/L (ref 39–117)
ALT: 56 U/L — ABNORMAL HIGH (ref 0–35)
AST: 39 U/L — AB (ref 0–37)
BILIRUBIN TOTAL: 0.6 mg/dL (ref 0.2–1.2)
BUN: 9 mg/dL (ref 6–23)
CO2: 31 mEq/L (ref 19–32)
CREATININE: 0.66 mg/dL (ref 0.40–1.20)
Calcium: 9.3 mg/dL (ref 8.4–10.5)
Chloride: 102 mEq/L (ref 96–112)
GFR: 100.26 mL/min (ref >60.00)
GLUCOSE: 103 mg/dL — AB (ref 70–99)
POTASSIUM: 4.3 meq/L (ref 3.5–5.1)
SODIUM: 140 meq/L (ref 135–145)
TOTAL PROTEIN: 6.8 g/dL (ref 6.0–8.3)

## 2016-04-02 LAB — LIPASE: LIPASE: 18 U/L (ref 11.0–59.0)

## 2016-04-02 MED ORDER — ONDANSETRON 4 MG PO TBDP: 4.0000 mg | ORAL_TABLET | Freq: Three times a day (TID) | ORAL | 0 refills | Status: DC | PRN

## 2016-04-02 NOTE — Telephone Encounter (Signed)
Noted, result note sent to [place]. Labs stable and do not suggest infectious process.

## 2016-04-02 NOTE — Telephone Encounter (Signed)
Kim from [facility] ultrasound calls to report stat abdominal results as ordered per Silvestre Moment (full report is in [facility]).  As results were mostly normal except for fatty infiltrates of the Liver, patient was released.  Information by this Probation officer forwarded on to NP for further review.

## 2016-04-02 NOTE — Progress Notes (Signed)
Pre visit review using our clinic review tool, if applicable. No additional management support is needed unless otherwise documented below in the visit note. 

## 2016-04-02 NOTE — Patient Instructions (Signed)
Complete lab work prior to leaving today. I will notify you of your results once received.   Stop by the front desk and speak with [facility] regarding your Ultrasound.  I will notify you of the results once received.   You may take Zofran every 8 hours as needed for nausea.  It was a pleasure to see you today!

## 2016-04-02 NOTE — Telephone Encounter (Signed)
Pt returned call. Please call back.

## 2016-04-02 NOTE — Progress Notes (Signed)
Subjective:    Patient ID: Abigail [name], [name]    DOB: [birth year], 51 y.o.   MRN: [id number]  HPI  Abigail [name] who presents today with a chief complaint of abdominal pain. Her pain is located to the RUQ with radiation around to her right upper back, and also with referred pain to the epigastric region. Her pain has been present for the past 2 weeks. She also reports nausea and bloating. Her pain is constant and is worse with particular meals. She's not been able to eat much due to her discomfort. She denies diarrhea, constipation, bloody stools, urinary symptoms.  Review of Systems  Constitutional: Positive for chills and fatigue. Negative for fever.  Gastrointestinal: Positive for abdominal distention, abdominal pain and nausea. Negative for blood in stool, constipation, diarrhea and vomiting.  Neurological: Negative for weakness.       Past Medical History:  Diagnosis Date  . Cardiac arrhythmia due to congenital heart disease   . Frequent headaches   . GERD (gastroesophageal reflux disease)   . Heart disease   . Kidney stones   . Migraine      Social History   Social History  . Marital status: Single    Spouse name: N/A  . Number of children: N/A  . Years of education: N/A   Occupational History  . Not on file.   Social History Main Topics  . Smoking status: Former Smoker    Types: Cigarettes    Quit date: 2008  . Smokeless tobacco: Never Used     Comment: used tobacco "socially"  . Alcohol use No  . Drug use: No  . Sexual activity: Not on file   Other Topics Concern  . Not on file   Social History Narrative   Single.   Has a set of Twins, age 40.   Works for the [employer] and [employer] and [employer]   Enjoys relaxing, spending time with her children.    Past Surgical History:  Procedure Laterality Date  . ABDOMINAL EXPLORATION SURGERY  1986  . NASAL SINUS SURGERY    . TUBAL LIGATION      Family History    Problem Relation Age of Onset  . COPD Father   . Emphysema Father     No Known Allergies  Current Outpatient Prescriptions on File Prior to Visit  Medication Sig Dispense Refill  . albuterol (PROVENTIL HFA;VENTOLIN HFA) 108 (90 Base) MCG/ACT inhaler Inhale 1-2 puffs into the lungs every 6 (six) hours as needed for wheezing or shortness of breath (cough). 1 Inhaler 0  . aspirin 81 MG tablet Take 81 mg by mouth daily.    . Chlorpheniramine-PSE-Ibuprofen 2-30-200 MG TABS Take 1-2 tablets by mouth every 6 (six) hours as needed (for cold symptoms).    [name] FLUoxetine (PROZAC) 40 MG capsule TAKE ONE CAPSULE BY MOUTH ONCE DAILY 90 capsule 1  . hydrOXYzine (ATARAX/VISTARIL) 25 MG tablet Take 1 tablet (25 mg total) by mouth 2 (two) times daily as needed for anxiety. 30 tablet 1  . ibuprofen (ADVIL,MOTRIN) 200 MG tablet Take 400 mg by mouth every 6 (six) hours as needed for pain or headache.    . metoprolol tartrate (LOPRESSOR) 25 MG tablet TAKE ONE-HALF TABLET BY MOUTH EVERY DAY 45 tablet 1  . tamsulosin (FLOMAX) 0.4 MG CAPS capsule Take 1 capsule by mouth daily to help release kidney stone. 30 capsule 0  . esomeprazole (NEXIUM) 40 MG capsule TAKE  1 CAPSULE BY MOUTH DAILY (Patient not taking: Reported on 04/02/2016) 90 capsule 1  . HYDROcodone-acetaminophen (NORCO/VICODIN) 5-325 MG tablet Take 1-2 tablets by mouth every 8 (eight) hours as needed for severe pain. (Patient not taking: Reported on 09/02/2015) 60 tablet 0  . medroxyPROGESTERone (PROVERA) 10 MG tablet Take 10 mg by mouth daily. Reported on 06/19/2015     No current facility-administered medications on file prior to visit.     BP 132/86   Pulse 85   Temp 98 F (36.7 C) (Oral)   Ht 5' 2"  (1.575 m)   Wt 195 lb 6.4 oz (88.6 kg)   LMP 08/12/2015   SpO2 98%   BMI 35.74 kg/m    Objective:   Physical Exam  Constitutional: She appears well-nourished.  Neck: Neck supple.  Cardiovascular: Normal rate and regular rhythm.    Pulmonary/Chest: Effort normal and breath sounds normal.  Abdominal: Soft. Bowel sounds are normal. There is tenderness in the right upper quadrant and epigastric area. There is no CVA tenderness.          Assessment & Plan:  Abdominal Pain:  Located to RUQ with radiation to back x 2 weeks. Appears uncomfortable during exam today. Tenderness to RUQ and epigastric region. Stable for outpatient treatment. Suspect gallbladder involvement. Check CMP, Lipase, CBC. Stat US to RUQ placed.  Rx for Zofran PRN sent to pharmacy. Will await results.  Sheral Flow, NP

## 2016-04-03 NOTE — Telephone Encounter (Signed)
Pt returned your call and is requesting call back

## 2016-04-03 NOTE — Telephone Encounter (Signed)
Spoken and notified patient of Kate's comments. Patient verbalized understanding. 

## 2016-04-17 DIAGNOSIS — Z79891 Long term (current) use of opiate analgesic: Secondary | ICD-10-CM | POA: Diagnosis not present

## 2016-04-17 DIAGNOSIS — M5106 Intervertebral disc disorders with myelopathy, lumbar region: Secondary | ICD-10-CM | POA: Diagnosis not present

## 2016-04-17 DIAGNOSIS — Z79899 Other long term (current) drug therapy: Secondary | ICD-10-CM | POA: Diagnosis not present

## 2016-04-17 DIAGNOSIS — G894 Chronic pain syndrome: Secondary | ICD-10-CM | POA: Diagnosis not present

## 2016-04-17 DIAGNOSIS — Q762 Congenital spondylolisthesis: Secondary | ICD-10-CM | POA: Diagnosis not present

## 2016-05-01 ENCOUNTER — Telehealth: Payer: Self-pay

## 2016-05-01 DIAGNOSIS — R1010 Upper abdominal pain, unspecified: Secondary | ICD-10-CM

## 2016-05-01 MED ORDER — DICYCLOMINE HCL 10 MG PO CAPS: 10.0000 mg | ORAL_CAPSULE | Freq: Three times a day (TID) | ORAL | 0 refills | Status: DC

## 2016-05-01 NOTE — Telephone Encounter (Signed)
Spoken and notified patient of Kate's comments. Patient verbalized understanding. 

## 2016-05-01 NOTE — Telephone Encounter (Signed)
Spoken and notified patient of Kate's comments. Patient stated that she is agreeable to the CT.  Also patient would like to know if Anda Kraft can even prescribed for this discomfort.

## 2016-05-01 NOTE — Telephone Encounter (Signed)
Noted. CT ordered. We can try some dicyclomine for her discomfort, I sent this into her pharmacy. Take 1 tablet by mouth before a meal as needed for pain.

## 2016-05-01 NOTE — Telephone Encounter (Signed)
Pt was seen and had [place] on 04/02/16; pt has lost wt; now wt is 190; pt has been avoiding fatty and fried foods. Now pt having pain that is more of a discomfort under rt rib cage that goes thru to back and goes down the back. Pt is nauseated but has not vomited; chills pt not sure if has fever,pt does not think this pain could be from fatty liver and pt request cb. [facility]  [address].

## 2016-05-01 NOTE — Telephone Encounter (Signed)
If she's continuing to experience these symptoms then we need to get better imaging. I'd like to proceed with a CT scan of her abdomen and pelvis. Let me know if she's agreeable and we will get this set up.

## 2016-05-04 ENCOUNTER — Encounter: Payer: Self-pay | Admitting: Gastroenterology

## 2016-05-04 ENCOUNTER — Telehealth: Payer: Self-pay | Admitting: Primary Care

## 2016-05-04 DIAGNOSIS — R1011 Right upper quadrant pain: Secondary | ICD-10-CM

## 2016-05-04 NOTE — Telephone Encounter (Signed)
Noted. CT cancelled, GI referral placed.

## 2016-05-04 NOTE — Telephone Encounter (Signed)
Called the patient to set up CT Scan. She wants you to cancel the CT Scan and put in a Gastroenterology referral instead. Please place referral and cancel your order for the CT.

## 2016-05-07 DIAGNOSIS — K760 Fatty (change of) liver, not elsewhere classified: Secondary | ICD-10-CM

## 2016-05-07 HISTORY — DX: Fatty (change of) liver, not elsewhere classified: K76.0

## 2016-06-03 ENCOUNTER — Ambulatory Visit (INDEPENDENT_AMBULATORY_CARE_PROVIDER_SITE_OTHER): Payer: Commercial Managed Care - HMO | Admitting: Gastroenterology

## 2016-06-03 ENCOUNTER — Other Ambulatory Visit (INDEPENDENT_AMBULATORY_CARE_PROVIDER_SITE_OTHER): Payer: Commercial Managed Care - HMO

## 2016-06-03 ENCOUNTER — Encounter (INDEPENDENT_AMBULATORY_CARE_PROVIDER_SITE_OTHER): Payer: Self-pay

## 2016-06-03 ENCOUNTER — Encounter: Payer: Self-pay | Admitting: Gastroenterology

## 2016-06-03 VITALS — BP 118/74 | Ht 62.0 in | Wt 191.1 lb

## 2016-06-03 DIAGNOSIS — K582 Mixed irritable bowel syndrome: Secondary | ICD-10-CM

## 2016-06-03 DIAGNOSIS — K5900 Constipation, unspecified: Secondary | ICD-10-CM | POA: Diagnosis not present

## 2016-06-03 DIAGNOSIS — R1011 Right upper quadrant pain: Secondary | ICD-10-CM

## 2016-06-03 DIAGNOSIS — K219 Gastro-esophageal reflux disease without esophagitis: Secondary | ICD-10-CM

## 2016-06-03 DIAGNOSIS — E739 Lactose intolerance, unspecified: Secondary | ICD-10-CM | POA: Diagnosis not present

## 2016-06-03 LAB — FOLATE: Folate: >23.5 ng/mL (ref >5.9)

## 2016-06-03 LAB — IBC PANEL
IRON: 76 ug/dL (ref 42–145)
Saturation Ratios: 16.8 % — ABNORMAL LOW (ref 20.0–50.0)
TRANSFERRIN: 323 mg/dL (ref 212.0–360.0)

## 2016-06-03 LAB — VITAMIN B12: Vitamin B-12: 367 pg/mL (ref 211–911)

## 2016-06-03 LAB — FERRITIN: FERRITIN: 77.6 ng/mL (ref 10.0–291.0)

## 2016-06-03 MED ORDER — NA SULFATE-K SULFATE-MG SULF 17.5-3.13-1.6 GM/177ML PO SOLN: ORAL | 0 refills | Status: DC

## 2016-06-03 MED ORDER — RANITIDINE HCL 150 MG PO TABS: 150.0000 mg | ORAL_TABLET | Freq: Every day | ORAL | 3 refills | Status: DC

## 2016-06-03 NOTE — Progress Notes (Signed)
Abigail [name]    [id number]    05-25-1965  Primary Care Physician:Clark,Katherine Lorre Nick, NP  Referring Physician: [name], NP [address] [place], [place] [address]  Chief complaint:  RUQ abdominal pain, Constipation alternating with diarrhea, bloating  HPI: 51 yr F here for new patient visit with c/o intermittent right upper quadrant abdominal pain. No association with diet or activity. She also has alternating diarrhea with constipation. Denies any nausea, vomiting, melena or bright red blood per rectum. Denies excessive use of NSAID's. No h/o EGD or colonoscopy. No family history of IBD, colon cancer or celiac disease    Outpatient Encounter Prescriptions as of 06/03/2016  Medication Sig  . albuterol (PROVENTIL HFA;VENTOLIN HFA) 108 (90 Base) MCG/ACT inhaler Inhale 1-2 puffs into the lungs every 6 (six) hours as needed for wheezing or shortness of breath (cough).  [name] aspirin 81 MG tablet Take 81 mg by mouth daily.  . Chlorpheniramine-PSE-Ibuprofen 2-30-200 MG TABS Take 1-2 tablets by mouth every 6 (six) hours as needed (for cold symptoms).  . dicyclomine (BENTYL) 10 MG capsule Take 1 capsule (10 mg total) by mouth 4 (four) times daily -  before meals and at bedtime.  [name] esomeprazole (NEXIUM) 40 MG capsule TAKE 1 CAPSULE BY MOUTH DAILY  . FLUoxetine (PROZAC) 40 MG capsule TAKE ONE CAPSULE BY MOUTH ONCE DAILY  . HYDROcodone-acetaminophen (NORCO/VICODIN) 5-325 MG tablet Take 1-2 tablets by mouth every 8 (eight) hours as needed for severe pain.  . hydrOXYzine (ATARAX/VISTARIL) 25 MG tablet Take 1 tablet (25 mg total) by mouth 2 (two) times daily as needed for anxiety.  [name] ibuprofen (ADVIL,MOTRIN) 200 MG tablet Take 400 mg by mouth every 6 (six) hours as needed for pain or headache.  . medroxyPROGESTERone (PROVERA) 10 MG tablet Take 10 mg by mouth daily. Reported on 06/19/2015  . methocarbamol (ROBAXIN) 500 MG tablet Take 500 mg by mouth every 8 (eight) hours  as needed for muscle spasms.  . metoprolol tartrate (LOPRESSOR) 25 MG tablet TAKE ONE-HALF TABLET BY MOUTH EVERY DAY  . ondansetron (ZOFRAN ODT) 4 MG disintegrating tablet Take 1 tablet (4 mg total) by mouth every 8 (eight) hours as needed for nausea or vomiting.  . tamsulosin (FLOMAX) 0.4 MG CAPS capsule Take 1 capsule by mouth daily to help release kidney stone.   No facility-administered encounter medications on file as of 06/03/2016.     Allergies as of 06/03/2016  . (No Known Allergies)    Past Medical History:  Diagnosis Date  . Anxiety   . Cardiac arrhythmia due to congenital heart disease   . Depression   . Fatty liver 05/2016  . Frequent headaches   . GERD (gastroesophageal reflux disease)   . Heart disease   . Kidney stones   . Kidney stones   . Migraine   . UTI (urinary tract infection)     Past Surgical History:  Procedure Laterality Date  . ABDOMINAL EXPLORATION SURGERY  1986  . NASAL SINUS SURGERY    . TUBAL LIGATION      Family History  Problem Relation Age of Onset  . COPD Father   . Emphysema Father   . Gallstones Mother   . Colon cancer Neg Hx   . Stomach cancer Neg Hx   . Esophageal cancer Neg Hx     Social History   Social History  . Marital status: Single    Spouse name: N/A  .  Number of children: N/A  . Years of education: N/A   Occupational History  . Not on file.   Social History Main Topics  . Smoking status: Former Smoker    Types: Cigarettes    Quit date: 2008  . Smokeless tobacco: Never Used     Comment: used tobacco "socially"  . Alcohol use No  . Drug use: No  . Sexual activity: Not on file   Other Topics Concern  . Not on file   Social History Narrative   Single.   Has a set of Twins, age 52.   Works for the [employer] and [employer] and [employer]   Enjoys relaxing, spending time with her children.      Review of systems: Review of Systems  Constitutional: Negative for fever and chills.  HENT:  Negative.   Eyes: Negative for blurred vision.  Respiratory: Negative for cough, shortness of breath and wheezing.   Cardiovascular: Negative for chest pain and palpitations.  Gastrointestinal: as per HPI Genitourinary: Negative for dysuria, urgency, frequency and hematuria.  Musculoskeletal: Negative for myalgias, back pain and joint pain.  Skin: Negative for itching and rash.  Neurological: Negative for dizziness, tremors, focal weakness, seizures and loss of consciousness.  Endo/Heme/Allergies: Positive for seasonal allergies.  Psychiatric/Behavioral: Negative for depression, suicidal ideas and hallucinations.  All other systems reviewed and are negative.   Physical Exam: Vitals:   06/03/16 0905  BP: 118/74   Body mass index is 34.94 kg/m. Gen:      No acute distress HEENT:  EOMI, sclera anicteric Neck:     No masses; no thyromegaly Lungs:    Clear to auscultation bilaterally; normal respiratory effort CV:         Regular rate and rhythm; no murmurs Abd:      + bowel sounds; soft, non-tender; no palpable masses, no distension Ext:    No edema; adequate peripheral perfusion Skin:      Warm and dry; no rash Neuro: alert and oriented x 3 Psych: normal mood and affect  Data Reviewed:  Reviewed labs, radiology imaging, old records and pertinent past GI work up   Assessment and Plan/Recommendations: 51 yr F here with c/o intermittent intermittent RUQ abdominal pain, alternating diarrhea and constipation associated with bloating  RUQ abd pain: unclear etiology Abdominal ultrasound showed evidence of fatty liver otherwise no gallbladder disease. BMP, LFT and CBC within normal limits Functional pain vs IBS vs constipation IB Gard 1-2 capsules TID as needed Will schedule for EGD to exclude peptic ulcer disease  GERD: Nexium daily 30 mins before breakfast Zantac 150 mg at bedtime as needed Anti reflux measures  Diarrhea with alternating constipation She likely has a component of lactose  intolerance as her symptoms are worse with intake of dairy  Colorectal cancer screening: average risk due for colonoscopy, schedule colonoscopy along with EGD  The risks and benefits as well as alternatives of endoscopic procedure(s) have been discussed and reviewed. All questions answered. The patient agrees to proceed.    Damaris Hippo , MD [phone] Mon-Fri 8a-5p [phone] after 5p, weekends, holidays  CC: [name], NP

## 2016-06-03 NOTE — Patient Instructions (Addendum)
Go to the basement for labs today  You have been scheduled for an endoscopy and colonoscopy. Please follow the written instructions given to you at your visit today. Please pick up your prep supplies at the pharmacy within the next 1-3 days. If you use inhalers (even only as needed), please bring them with you on the day of your procedure. Your physician has requested that you go to www.startemmi.com and enter the access code given to you at your visit today. This web site gives a general overview about your procedure. However, you should still follow specific instructions given to you by our office regarding your preparation for the procedure.  We will send Zantac 150 mg at bedtime daily as needed  Lactose free diet  IBgard three times a day as needed   Lactose-Free Diet, Adult If you have lactose intolerance, you are not able to digest lactose. Lactose is a natural sugar found mainly in milk and milk products. You may need to avoid all foods and beverages that contain lactose. A lactose-free diet can help you do this. What do I need to know about this diet?  Do not consume foods, beverages, vitamins, minerals, or medicines with lactose. Read ingredients lists carefully.  Look for the words "lactose-free" on labels.  Use lactase enzyme drops or tablets as directed by your health care provider.  Use lactose-free milk or a milk alternative, such as soy milk, for drinking and cooking.  Make sure you get enough calcium and vitamin D in your diet. A lactose-free eating plan can be lacking in these important nutrients.  Take calcium and vitamin D supplements as directed by your health care provider. Talk to your provider about supplements if you are not able to get enough calcium and vitamin D from food. Which foods have lactose? Lactose is found in:  Milk and foods made from milk.  Yogurt.  Cheese.  Butter.  Margarine.  Sour cream.  Cream.  Whipped toppings and nondairy  creamers.  Ice cream and other milk-based desserts. Lactose is also found in foods or products made with milk or milk ingredients. To find out whether a food contains milk or a milk ingredient, look at the ingredients list. Avoid foods with the statement "May contain milk" and foods that contain:  Butter.  Cream.  Milk.  Milk solids.  Milk powder.  Whey.  Curd.  Caseinate.  Lactose.  Lactalbumin.  Lactoglobulin. What are some alternatives to milk and foods made with milk products?  Lactose-free milk.  Soy milk with added calcium and vitamin D.  Almond, coconut, or rice milk with added calcium and vitamin D. Note that these are low in protein.  Soy products, such as soy yogurt, soy cheese, soy ice cream, and soy-based sour cream. Which foods can I eat? Grains  Breads and rolls made without milk, such as [place], [place], or [place] bread, bagels, pita, and [employer]. Corn tortillas, corn meal, grits, and polenta. Crackers without lactose or milk solids, such as soda crackers and graham crackers. Cooked or dry cereals without lactose or milk solids. Pasta, quinoa, couscous, barley, oats, bulgur, farro, rice, wild rice, or other grains prepared without milk or lactose. Plain popcorn. Vegetables  Fresh, frozen, and canned vegetables without cheese, cream, or butter sauces. Fruits  All fresh, canned, frozen, or dried fruits that are not processed with lactose. Meats and Other Protein Sources  Plain beef, chicken, fish, [place], lamb, veal, pork, wild game, or ham. Kosher-prepared meat products. Strained or junior meats  that do not contain milk. Eggs. Soy meat substitutes. Beans, lentils, and hummus. Tofu. Nuts and seeds. Peanut or other nut butters without lactose. Soups, casseroles, and mixed dishes without cheese, cream, or milk. Dairy  Lactose-free milk. Soy, rice, or almond milk with added calcium and vitamin D. Soy cheese and yogurt. Beverages  Carbonated drinks. Tea. Coffee,  freeze-dried coffee, and some instant coffees. Fruit and vegetable juices. Condiments  Soy sauce. Carob powder. Olives. Gravy made with water. Baker's cocoa. Angie Fava. Pure seasonings and spices. Ketchup. Mustard. Bouillon. Broth. Sweets and Desserts  Water and fruit ices. Gelatin. Cookies, pies, or cakes made from allowed ingredients, such as angel food cake. Pudding made with water or a milk substitute. Lactose-free tofu desserts. Soy, coconut milk, or rice-milk-based frozen desserts. Sugar. Honey. Jam, jelly, and marmalade. Molasses. Pure sugar candy. Dark chocolate without milk. Marshmallows. Fats and Oils  Margarines and salad dressings that do not contain milk. Berniece Salines. Vegetable oils. Shortening. Mayonnaise. Soy or coconut-based cream. The items listed above may not be a complete list of recommended foods or beverages. Contact your dietitian for more options.  Which foods are not recommended? Grains  Breads and rolls that contain milk. Toaster pastries. Muffins, biscuits, waffles, cornbread, and pancakes. These can be prepared at home, commercial, or from mixes. Sweet rolls, donuts, English muffins, fry bread, lefse, flour tortillas with lactose, or [place] toast made with milk or milk ingredients. Crackers that contain lactose. Corn curls. Cooked or dry cereals with lactose. Vegetables  Creamed or breaded vegetables. Vegetables in a cheese or butter sauce or with lactose-containing margarines. Instant potatoes. [place] fries. Scalloped or au gratin potatoes. Fruits  None. Meats and Other Protein Sources  Scrambled eggs, omelets, and souffles that contain milk. Creamed or breaded meat, fish, chicken, or [place]. Sausage products, such as wieners and liver sausage. Cold cuts that contain milk solids. Cheese, cottage cheese, ricotta cheese, and cheese spreads. Lasagna and macaroni and cheese. Pizza. Peanut or other nut butters with added milk solids. Casseroles or mixed dishes containing milk or  cheese. Dairy  All dairy products, including milk, goat's milk, buttermilk, kefir, acidophilus milk, flavored milk, evaporated milk, condensed milk, dulce de [place], eggnog, yogurt, cheese, and cheese spreads. Beverages  Hot chocolate. Cocoa with lactose. Instant iced teas. Powdered fruit drinks. Smoothies made with milk or yogurt. Condiments  Chewing gum that has lactose. Cocoa that has lactose. Spice blends if they contain milk products. Artificial sweeteners that contain lactose. Nondairy creamers. Sweets and Desserts  Ice cream, ice milk, gelato, sherbet, and frozen yogurt. Custard, pudding, and mousse. Cake, cream pies, cookies, and other desserts containing milk, cream, cream cheese, or milk chocolate. Pie crust made with milk-containing margarine or butter. Reduced-calorie desserts made with a sugar substitute that contains lactose. Toffee and butterscotch. Milk, white, or dark chocolate that contains milk. Fudge. Caramel. Fats and Oils  Margarines and salad dressings that contain milk or cheese. Cream. Half and half. Cream cheese. Sour cream. Chip dips made with sour cream or yogurt. The items listed above may not be a complete list of foods and beverages to avoid. Contact your dietitian for more information.  Am I getting enough calcium? Calcium is found in many foods that contain lactose and is important for bone health. The amount of calcium you need depends on your age:  Adults younger than 50 years: 1000 mg of calcium a day.  Adults older than 50 years: 1200 mg of calcium a day. If you are not getting enough calcium,  other calcium sources include:  Orange juice with calcium added. There are 300-350 mg of calcium in 1 cup of orange juice.  Sardines with edible bones. There are 325 mg of calcium in 3 oz of sardines.  Calcium-fortified soy milk. There are 300-400 mg of calcium in 1 cup of calcium-fortified soy milk.  Calcium-fortified rice or almond milk. There are 300 mg of  calcium in 1 cup of calcium-fortified rice or almond milk.  Canned salmon with edible bones. There are 180 mg of calcium in 3 oz of canned salmon with edible bones.  Calcium-fortified breakfast cereals. There are [phone] mg of calcium in calcium-fortified breakfast cereals.  Tofu set with calcium sulfate. There are 250 mg of calcium in  cup of tofu set with calcium sulfate.  Spinach, cooked. There are 145 mg of calcium in  cup of cooked spinach.  Edamame, cooked. There are 130 mg of calcium in  cup of cooked edamame.  Collard greens, cooked. There are 125 mg of calcium in  cup of cooked collard greens.  Kale, frozen or cooked. There are 90 mg of calcium in  cup of cooked or frozen kale.  Almonds. There are 95 mg of calcium in  cup of almonds.  Broccoli, cooked. There are 60 mg of calcium in 1 cup of cooked broccoli. This information is not intended to replace advice given to you by your health care provider. Make sure you discuss any questions you have with your health care provider. Document Released: 08/15/2001 Document Revised: 08/01/2015 Document Reviewed: 05/26/2013 Elsevier Interactive Patient Education  2017 [employer].

## 2016-06-07 HISTORY — PX: COLONOSCOPY: SHX174

## 2016-06-09 ENCOUNTER — Other Ambulatory Visit: Payer: Self-pay | Admitting: Primary Care

## 2016-06-09 DIAGNOSIS — R002 Palpitations: Secondary | ICD-10-CM

## 2016-06-09 NOTE — Telephone Encounter (Signed)
Ok to refill? Electronically refill request for metoprolol tartrate (LOPRESSOR) 25 MG tablet. Last prescribed on 10/23/2015. Last seen on 04/02/2016

## 2016-06-16 ENCOUNTER — Encounter: Payer: Self-pay | Admitting: Gastroenterology

## 2016-06-16 ENCOUNTER — Other Ambulatory Visit: Payer: Self-pay | Admitting: Primary Care

## 2016-06-16 DIAGNOSIS — R1011 Right upper quadrant pain: Secondary | ICD-10-CM

## 2016-06-16 NOTE — Telephone Encounter (Signed)
Received refill request for Zofran. Is she still experiencing nausea?

## 2016-06-16 NOTE — Telephone Encounter (Signed)
Received refill electronically Last refill 04/02/16 #20 Last office visit same date

## 2016-06-17 MED ORDER — ONDANSETRON 4 MG PO TBDP: 4.0000 mg | ORAL_TABLET | Freq: Three times a day (TID) | ORAL | 0 refills | Status: DC | PRN

## 2016-06-17 NOTE — Telephone Encounter (Signed)
Spoke to pt who states she still occasionally experiences nausea, the last episode being Sunday. She is wanting to refill of zofran. Pt also wanted to advise you that she is scheduled to have a colonoscopy and endoscopy 4/24

## 2016-06-17 NOTE — Telephone Encounter (Signed)
Noted,refill sent

## 2016-06-22 DIAGNOSIS — Q762 Congenital spondylolisthesis: Secondary | ICD-10-CM | POA: Diagnosis not present

## 2016-06-30 ENCOUNTER — Ambulatory Visit (AMBULATORY_SURGERY_CENTER): Payer: Commercial Managed Care - HMO | Admitting: Gastroenterology

## 2016-06-30 ENCOUNTER — Encounter: Payer: Self-pay | Admitting: Gastroenterology

## 2016-06-30 VITALS — BP 106/60 | HR 80 | Temp 97.1°F | Resp 16 | Ht 62.0 in | Wt 191.0 lb

## 2016-06-30 DIAGNOSIS — R1013 Epigastric pain: Secondary | ICD-10-CM | POA: Diagnosis not present

## 2016-06-30 DIAGNOSIS — R1011 Right upper quadrant pain: Secondary | ICD-10-CM

## 2016-06-30 DIAGNOSIS — Z1211 Encounter for screening for malignant neoplasm of colon: Secondary | ICD-10-CM

## 2016-06-30 DIAGNOSIS — K582 Mixed irritable bowel syndrome: Secondary | ICD-10-CM

## 2016-06-30 DIAGNOSIS — D120 Benign neoplasm of cecum: Secondary | ICD-10-CM | POA: Diagnosis not present

## 2016-06-30 DIAGNOSIS — K5190 Ulcerative colitis, unspecified, without complications: Secondary | ICD-10-CM | POA: Diagnosis not present

## 2016-06-30 DIAGNOSIS — Z1212 Encounter for screening for malignant neoplasm of rectum: Secondary | ICD-10-CM | POA: Diagnosis not present

## 2016-06-30 MED ORDER — SODIUM CHLORIDE 0.9 % IV SOLN: 500.0000 mL | INTRAVENOUS | Status: DC

## 2016-06-30 NOTE — Progress Notes (Signed)
Called to room to assist during endoscopic procedure.  Patient ID and intended procedure confirmed with present staff. Received instructions for my participation in the procedure from the performing physician.  

## 2016-06-30 NOTE — Patient Instructions (Signed)
YOU HAD AN ENDOSCOPIC PROCEDURE TODAY AT [facility]:   Refer to the procedure report that was given to you for any specific questions about what was found during the examination.  If the procedure report does not answer your questions, please call your gastroenterologist to clarify.  If you requested that your care partner not be given the details of your procedure findings, then the procedure report has been included in a sealed envelope for you to review at your convenience later.  YOU SHOULD EXPECT: Some feelings of bloating in the abdomen. Passage of more gas than usual.  Walking can help get rid of the air that was put into your GI tract during the procedure and reduce the bloating. If you had a lower endoscopy (such as a colonoscopy or flexible sigmoidoscopy) you may notice spotting of blood in your stool or on the toilet paper. If you underwent a bowel prep for your procedure, you may not have a normal bowel movement for a few days.  Please Note:  You might notice some irritation and congestion in your nose or some drainage.  This is from the oxygen used during your procedure.  There is no need for concern and it should clear up in a day or so.  SYMPTOMS TO REPORT IMMEDIATELY:   Following lower endoscopy (colonoscopy or flexible sigmoidoscopy):  Excessive amounts of blood in the stool  Significant tenderness or worsening of abdominal pains  Swelling of the abdomen that is new, acute  Fever of 100F or higher   Following upper endoscopy (EGD)  Vomiting of blood or coffee ground material  New chest pain or pain under the shoulder blades  Painful or persistently difficult swallowing  New shortness of breath  Fever of 100F or higher  Black, tarry-looking stools  For urgent or emergent issues, a gastroenterologist can be reached at any hour by calling [phone].   DIET:  We do recommend a small meal at first, but then you may proceed to your regular diet.  Drink  plenty of fluids but you should avoid alcoholic beverages for 24 hours.  ACTIVITY:  You should plan to take it easy for the rest of today and you should NOT DRIVE or use heavy machinery until tomorrow (because of the sedation medicines used during the test).    FOLLOW UP: Our staff will call the number listed on your records the next business day following your procedure to check on you and address any questions or concerns that you may have regarding the information given to you following your procedure. If we do not reach you, we will leave a message.  However, if you are feeling well and you are not experiencing any problems, there is no need to return our call.  We will assume that you have returned to your regular daily activities without incident.  If any biopsies were taken you will be contacted by phone or by letter within the next 1-3 weeks.  Please call us at [phone] if you have not heard about the biopsies in 3 weeks.    SIGNATURES/CONFIDENTIALITY: You and/or your care partner have signed paperwork which will be entered into your electronic medical record.  These signatures attest to the fact that that the information above on your After Visit Summary has been reviewed and is understood.  Full responsibility of the confidentiality of this discharge information lies with you and/or your care-partner.   Resume medications. Information given on polyps,diverticulosis and hiatal hernia.

## 2016-06-30 NOTE — Op Note (Signed)
[place] Patient Name: Abigail [name] Procedure Date: 06/30/2016 3:24 PM MRN: [id number] Endoscopist: Mauri Pole , MD Age: 51 Referring MD:  Date of Birth: [birth year] Gender: Female Account #: [id number] Procedure:                Colonoscopy Indications:              Screening for colorectal malignant neoplasm, This                            is the patient's first colonoscopy Medicines:                Monitored Anesthesia Care Procedure:                Pre-Anesthesia Assessment:                           - Prior to the procedure, a History and Physical                            was performed, and patient medications and                            allergies were reviewed. The patient's tolerance of                            previous anesthesia was also reviewed. The risks                            and benefits of the procedure and the sedation                            options and risks were discussed with the patient.                            All questions were answered, and informed consent                            was obtained. Prior Anticoagulants: The patient has                            taken no previous anticoagulant or antiplatelet                            agents. ASA Grade Assessment: II - A patient with                            mild systemic disease. After reviewing the risks                            and benefits, the patient was deemed in                            satisfactory condition to undergo the procedure.  After obtaining informed consent, the colonoscope                            was passed under direct vision. Throughout the                            procedure, the patient's blood pressure, pulse, and                            oxygen saturations were monitored continuously. The                            Colonoscope was introduced through the anus and                            advanced to the  the terminal ileum, with                            identification of the appendiceal orifice and IC                            valve. The colonoscopy was performed without                            difficulty. The patient tolerated the procedure                            well. The quality of the bowel preparation was                            excellent. The terminal ileum, ileocecal valve,                            appendiceal orifice, and rectum were photographed. Scope In: 3:42:25 PM Scope Out: 3:55:27 PM Scope Withdrawal Time: 0 hours 10 minutes 10 seconds  Total Procedure Duration: 0 hours 13 minutes 2 seconds  Findings:                 The perianal and digital rectal examinations were                            normal.                           A 3 mm polyp was found in the cecum. The polyp was                            sessile. The polyp was removed with a cold snare.                            Resection and retrieval were complete.                           A few small-mouthed diverticula were found in the  sigmoid colon.                           Non-bleeding internal hemorrhoids were found during                            retroflexion. The hemorrhoids were small. Complications:            No immediate complications. Estimated Blood Loss:     Estimated blood loss was minimal. Impression:               - One 3 mm polyp in the cecum, removed with a cold                            snare. Resected and retrieved.                           - Diverticulosis in the sigmoid colon.                           - Non-bleeding internal hemorrhoids. Recommendation:           - Patient has a contact number available for                            emergencies. The signs and symptoms of potential                            delayed complications were discussed with the                            patient. Return to normal activities tomorrow.                             Written discharge instructions were provided to the                            patient.                           - Resume previous diet.                           - Continue present medications.                           - Await pathology results.                           - Repeat colonoscopy in 5-10 years for surveillance                            based on pathology results. Mauri Pole, MD 06/30/2016 4:01:45 PM This report has been signed electronically.

## 2016-06-30 NOTE — Op Note (Signed)
[facility] Patient Name: Abigail [name] Procedure Date: 06/30/2016 3:25 PM MRN: [id number] Endoscopist: Mauri Pole , MD Age: 51 Referring MD:  Date of Birth: August 09, 1965 Gender: Female Account #: [id number] Procedure:                Upper GI endoscopy Indications:              Upper abdominal symptoms that persist despite an                            appropriate trial of therapy, Epigastric abdominal                            pain, Abdominal pain in the right upper quadrant Medicines:                Monitored Anesthesia Care Procedure:                Pre-Anesthesia Assessment:                           - Prior to the procedure, a History and Physical                            was performed, and patient medications and                            allergies were reviewed. The patient's tolerance of                            previous anesthesia was also reviewed. The risks                            and benefits of the procedure and the sedation                            options and risks were discussed with the patient.                            All questions were answered, and informed consent                            was obtained. Prior Anticoagulants: The patient has                            taken no previous anticoagulant or antiplatelet                            agents. ASA Grade Assessment: II - A patient with                            mild systemic disease. After reviewing the risks                            and benefits, the patient was deemed in  satisfactory condition to undergo the procedure.                           After obtaining informed consent, the endoscope was                            passed under direct vision. Throughout the                            procedure, the patient's blood pressure, pulse, and                            oxygen saturations were monitored continuously. The      Endoscope was introduced through the mouth, and                            advanced to the second part of duodenum. The upper                            GI endoscopy was accomplished without difficulty.                            The patient tolerated the procedure well. Scope In: Scope Out: Findings:                 The esophagus was normal.                           A small hiatal hernia was present.                           The cardia and gastric fundus were normal on                            retroflexion.                           The duodenal bulb and second portion of the                            duodenum were normal. Biopsies were taken with a                            cold forceps for histology. Complications:            No immediate complications. Estimated Blood Loss:     Estimated blood loss was minimal. Impression:               - Normal esophagus.                           - Small hiatal hernia.                           - Normal duodenal bulb and second portion of the  duodenum. Biopsied. Recommendation:           - Resume previous diet.                           - Continue present medications.                           - Await pathology results. Mauri Pole, MD 06/30/2016 3:59:12 PM This report has been signed electronically.

## 2016-06-30 NOTE — Progress Notes (Signed)
Pt's states no medical or surgical changes since previsit or office visit. 

## 2016-07-01 ENCOUNTER — Telehealth: Payer: Self-pay

## 2016-07-01 NOTE — Telephone Encounter (Signed)
  Follow up Call-  Call back number 06/30/2016  Post procedure Call Back phone  # [phone]  Permission to leave phone message Yes  Some recent data might be hidden     Patient questions:  Do you have a fever, pain , or abdominal swelling? No. Pain Score  0 *  Have you tolerated food without any problems? Yes.    Have you been able to return to your normal activities? Yes.    Do you have any questions about your discharge instructions: Diet   No. Medications  No. Follow up visit  No.  Do you have questions or concerns about your Care? No.  Actions: * If pain score is 4 or above: No action needed, pain <4.

## 2016-07-03 ENCOUNTER — Encounter: Payer: Self-pay | Admitting: Gastroenterology

## 2016-07-03 ENCOUNTER — Other Ambulatory Visit: Payer: Self-pay | Admitting: Primary Care

## 2016-07-03 DIAGNOSIS — R05 Cough: Secondary | ICD-10-CM

## 2016-07-03 DIAGNOSIS — R059 Cough, unspecified: Secondary | ICD-10-CM

## 2016-07-06 ENCOUNTER — Encounter: Payer: Self-pay | Admitting: Primary Care

## 2016-07-20 DIAGNOSIS — M5136 Other intervertebral disc degeneration, lumbar region: Secondary | ICD-10-CM | POA: Diagnosis not present

## 2016-07-28 HISTORY — PX: LEFT HEART CATH AND CORONARY ANGIOGRAPHY: CATH118249

## 2016-08-12 DIAGNOSIS — Q762 Congenital spondylolisthesis: Secondary | ICD-10-CM | POA: Diagnosis not present

## 2016-08-12 DIAGNOSIS — M5136 Other intervertebral disc degeneration, lumbar region: Secondary | ICD-10-CM | POA: Diagnosis not present

## 2016-08-27 ENCOUNTER — Other Ambulatory Visit: Payer: Self-pay | Admitting: Gastroenterology

## 2016-08-28 MED ORDER — LINACLOTIDE 72 MCG PO CAPS
72.0000 ug | ORAL_CAPSULE | Freq: Every day | ORAL | 11 refills | Status: DC
Start: 2016-08-28 — End: 2017-12-18

## 2016-08-28 NOTE — Telephone Encounter (Signed)
Please send Prescription for Linzess 51mg daily x11 refills. Thanks

## 2016-08-28 NOTE — Telephone Encounter (Signed)
Patient states that she was given samples of Linzess in the office for her complaints of constipation. She states that she has done well with this and takes on an "as needed" basis rather than every day. She would like a prescription of this. She has no recollection of the dosage she was given and does not have the bottle either (I am unable to find any documentation of dosage given either). Please advise.Marland KitchenMarland KitchenMarland Kitchen

## 2016-08-31 NOTE — Telephone Encounter (Signed)
Dr Silverio Decamp sent rx to patient's pharmacy.

## 2016-09-03 ENCOUNTER — Other Ambulatory Visit: Payer: Self-pay | Admitting: Primary Care

## 2016-09-03 DIAGNOSIS — R059 Cough, unspecified: Secondary | ICD-10-CM

## 2016-09-03 DIAGNOSIS — R05 Cough: Secondary | ICD-10-CM

## 2016-09-04 ENCOUNTER — Ambulatory Visit (INDEPENDENT_AMBULATORY_CARE_PROVIDER_SITE_OTHER): Payer: 59 | Admitting: Family Medicine

## 2016-09-04 ENCOUNTER — Encounter: Payer: Self-pay | Admitting: Family Medicine

## 2016-09-04 VITALS — BP 142/90 | HR 83 | Temp 98.3°F | Wt 185.2 lb

## 2016-09-04 DIAGNOSIS — R5383 Other fatigue: Secondary | ICD-10-CM

## 2016-09-04 DIAGNOSIS — R350 Frequency of micturition: Secondary | ICD-10-CM

## 2016-09-04 DIAGNOSIS — E034 Atrophy of thyroid (acquired): Secondary | ICD-10-CM | POA: Diagnosis not present

## 2016-09-04 DIAGNOSIS — J069 Acute upper respiratory infection, unspecified: Secondary | ICD-10-CM

## 2016-09-04 DIAGNOSIS — B9789 Other viral agents as the cause of diseases classified elsewhere: Secondary | ICD-10-CM | POA: Diagnosis not present

## 2016-09-04 LAB — POCT URINALYSIS DIPSTICK
Bilirubin, UA: 1
GLUCOSE UA: NEGATIVE
Ketones, UA: NEGATIVE
Leukocytes, UA: NEGATIVE
Nitrite, UA: NEGATIVE
Protein, UA: 15
RBC UA: NEGATIVE
SPEC GRAV UA: 1.025 (ref 1.010–1.025)
Urobilinogen, UA: 1 E.U./dL
pH, UA: 6 (ref 5.0–8.0)

## 2016-09-04 LAB — TSH: TSH: 6.1 u[IU]/mL — AB (ref 0.35–4.50)

## 2016-09-04 MED ORDER — LEVOTHYROXINE SODIUM 50 MCG PO TABS: 50.0000 ug | ORAL_TABLET | Freq: Every day | ORAL | 1 refills | Status: DC

## 2016-09-04 MED ORDER — AMOXICILLIN 875 MG PO TABS: 875.0000 mg | ORAL_TABLET | Freq: Two times a day (BID) | ORAL | 0 refills | Status: DC

## 2016-09-04 MED ORDER — BENZONATATE 100 MG PO CAPS: 100.0000 mg | ORAL_CAPSULE | Freq: Three times a day (TID) | ORAL | 0 refills | Status: DC | PRN

## 2016-09-04 NOTE — Patient Instructions (Addendum)
For nasal congestion you can use Afrin nasal spray for 3 days max, Sudafed, saline nasal spray (generic is fine for all). For cough you can try Delsym. Take a daily antihistamine like Zyrtec, Claritin or Allegra- generic is fine Drink enough fluids to make your urine light yellow. For fever/chill/muscle aches you can take over the counter acetaminophen or ibuprofen.  Please come back in if you are not better in 5-7 days or if you develop wheezing, shortness of breath or persistent vomiting.  If not better in 5 days, can start antibiotic

## 2016-09-04 NOTE — Progress Notes (Signed)
Subjective:    Patient ID: Abigail [name], female    DOB: [birth year], 52 y.o.   MRN: [id number]  HPI This is a 51 yo female who presents today for an acute visit. She has had a cough x 2 days, mostly dry, some runny nose. Has been taking Advil allergy and nasacort. No wheezing or SOB. No known sick contacts. Has seasonal allergies, takes Advil allergy and nasacort for this. Some sore throat, no ear pain. Had one left over tessalon perles that she took with good relief. Some headache with cough. No smoking, no history asthma.   Has been more fatigued over last 2 weeks. Sleeps well, 7-8 hours a night. Works outside some. No rash, no myalgias. Has not been exercising over last 2 weeks, previously exercising 3x week. Has referral to PT for back, didn't go this week because she felt poorly.   Has noticed urinary frequency, no dysuria. Chronic back pain, no change.   Past Medical History:  Diagnosis Date  . Anxiety   . Cardiac arrhythmia due to congenital heart disease   . Depression   . Diverticulosis    Sigmoid colon  . Fatty liver 05/2016  . Frequent headaches   . GERD (gastroesophageal reflux disease)   . Heart disease   . Kidney stones   . Kidney stones   . Migraine   . UTI (urinary tract infection)    Past Surgical History:  Procedure Laterality Date  . ABDOMINAL EXPLORATION SURGERY  1986  . NASAL SINUS SURGERY    . TUBAL LIGATION     Family History  Problem Relation Age of Onset  . COPD Father   . Emphysema Father   . Gallstones Mother   . Colon cancer Neg Hx   . Stomach cancer Neg Hx   . Esophageal cancer Neg Hx    Social History  Substance Use Topics  . Smoking status: Former Smoker    Types: Cigarettes    Quit date: 2008  . Smokeless tobacco: Never Used     Comment: used tobacco "socially"  . Alcohol use No      Review of Systems Per HPI    Objective:   Physical Exam  Constitutional: She is oriented to person, place, and time. She appears  well-developed and well-nourished. No distress.  HENT:  Head: Normocephalic and atraumatic.  Right Ear: Tympanic membrane, external ear and ear canal normal.  Left Ear: Tympanic membrane, external ear and ear canal normal.  Nose: Mucosal edema present. Right sinus exhibits no maxillary sinus tenderness and no frontal sinus tenderness. Left sinus exhibits no maxillary sinus tenderness and no frontal sinus tenderness.  Mouth/Throat: Uvula is midline, oropharynx is clear and moist and mucous membranes are normal.  Small amount post nasal drainage.   Eyes: Conjunctivae are normal.  Neck: Normal range of motion. Neck supple.  Cardiovascular: Normal rate, regular rhythm and normal heart sounds.   Pulmonary/Chest: Effort normal and breath sounds normal.  Lymphadenopathy:    She has no cervical adenopathy.  Neurological: She is alert and oriented to person, place, and time.  Skin: Skin is warm and dry. She is not diaphoretic.  Psychiatric: She has a normal mood and affect. Her behavior is normal. Judgment and thought content normal.  Vitals reviewed.     BP (!) 142/90 (BP Location: Right Arm, Patient Position: Sitting, Cuff Size: Normal)   Pulse 83   Temp 98.3 F (36.8 C) (Oral)   Wt 185 lb 3.2 oz (84 kg)  LMP 08/12/2015   SpO2 97%   BMI 33.87 kg/m  Wt Readings from Last 3 Encounters:  09/04/16 185 lb 3.2 oz (84 kg)  06/30/16 191 lb (86.6 kg)  06/03/16 191 lb 0.8 oz (86.7 kg)   BP Readings from Last 3 Encounters:  09/04/16 (!) 142/90  06/30/16 106/60  06/03/16 118/74   Results for orders placed or performed in visit on 09/04/16  POCT urinalysis dipstick  Result Value Ref Range   Color, UA Dark Yellow    Clarity, UA Clear    Glucose, UA Negative    Bilirubin, UA 1 mg/dL    Ketones, UA Negative    Spec Grav, UA 1.025 1.010 - 1.025   Blood, UA Negative    pH, UA 6.0 5.0 - 8.0   Protein, UA 15 mg/dL    Urobilinogen, UA 1.0 0.2 or 1.0 E.U./dL   Nitrite, UA Negative     Leukocytes, UA Negative Negative       Assessment & Plan:  1. Viral URI with cough - Provided written and verbal information regarding diagnosis and treatment. - provided wait and see antibiotic if not better in 5 days - amoxicillin (AMOXIL) 875 MG tablet; Take 1 tablet (875 mg total) by mouth 2 (two) times daily.  Dispense: 14 tablet; Refill: 0 - benzonatate (TESSALON) 100 MG capsule; Take 1 capsule (100 mg total) by mouth 3 (three) times daily as needed for cough.  Dispense: 30 capsule; Refill: 0  2. Other fatigue - TSH  3. Urinary frequency - POCT urinalysis dipstick - negative urine  Clarene Reamer, FNP-BC  [facility] [place], [facility]  09/04/2016 10:26 AM

## 2016-09-04 NOTE — Addendum Note (Signed)
Addended by: Clarene Reamer B on: 09/04/2016 02:39 PM   Modules accepted: Orders

## 2016-09-17 ENCOUNTER — Ambulatory Visit (INDEPENDENT_AMBULATORY_CARE_PROVIDER_SITE_OTHER): Payer: 59 | Admitting: Primary Care

## 2016-09-17 ENCOUNTER — Encounter: Payer: Self-pay | Admitting: Primary Care

## 2016-09-17 VITALS — BP 136/84 | HR 72 | Temp 97.5°F | Ht 62.0 in | Wt 184.8 lb

## 2016-09-17 DIAGNOSIS — F329 Major depressive disorder, single episode, unspecified: Secondary | ICD-10-CM | POA: Diagnosis not present

## 2016-09-17 DIAGNOSIS — R002 Palpitations: Secondary | ICD-10-CM

## 2016-09-17 DIAGNOSIS — G8929 Other chronic pain: Secondary | ICD-10-CM

## 2016-09-17 DIAGNOSIS — E039 Hypothyroidism, unspecified: Secondary | ICD-10-CM

## 2016-09-17 DIAGNOSIS — F419 Anxiety disorder, unspecified: Secondary | ICD-10-CM

## 2016-09-17 DIAGNOSIS — K219 Gastro-esophageal reflux disease without esophagitis: Secondary | ICD-10-CM | POA: Diagnosis not present

## 2016-09-17 DIAGNOSIS — F32A Depression, unspecified: Secondary | ICD-10-CM

## 2016-09-17 DIAGNOSIS — M5441 Lumbago with sciatica, right side: Secondary | ICD-10-CM | POA: Diagnosis not present

## 2016-09-17 DIAGNOSIS — E669 Obesity, unspecified: Secondary | ICD-10-CM

## 2016-09-17 NOTE — Assessment & Plan Note (Signed)
Diagnosed 2 weeks ago, taking levothyorixine 50 mcg. Recheck TSH in 4 weeks.

## 2016-09-17 NOTE — Assessment & Plan Note (Addendum)
Following with GI, managed on Zantac HS and Nexium in AM. Working on weight loss.

## 2016-09-17 NOTE — Assessment & Plan Note (Signed)
Feels well managed on metoprolol, continue same.

## 2016-09-17 NOTE — Progress Notes (Signed)
Subjective:    Patient ID: Abigail [name], female    DOB: 09-24-1965, 51 y.o.   MRN: [id number]  HPI  Abigail [name] is a 51 year old female who presents today for follow up.  1) Anxiety and Depression: Currently managed on Fluoxetine 40 mg, hydroxyzine 25 mg PRN. Overall she's feeling well managed. She's not taking the hydroxyzine 25 mg. Previously taking Xanax for which we discontinued.   2) Hypothyroidism: Currently managed on levothyroxine 50 mcg that was initiated on 09/04/16. She was evaluated at [facility] on 09/04/16 with complaints of fatigue. TSH was noted to be 6.10 so she was initiated on treatment. She's been compliant to her levothyroxine and started 1 week ago.   3) Chronic Back Pain: Currently Overall she's feeling a little better since she started exercising and losing weight. She is currently following with [facility]. She will start physical therapy next week. She's taking Meloxicam, Tramadol, methocarbamol, and hydrocodone. She uses the hydrocodone sparingly.   4) Palpitations: Currently managed on metoprolol 12.5 mg once daily. Feels well managed.  5) GERD: Currently managed on Nexium 40 mg and Zantac 150 mg. Following with GI. Completed colonoscopy recently, small polyp which was precancerous, due in 5 years. Currently managed on Linzess PRN.   Review of Systems  Constitutional: Positive for fatigue.  Respiratory: Negative for shortness of breath.   Cardiovascular: Negative for chest pain and palpitations.  Gastrointestinal:       Intermittent constipation  Neurological: Negative for headaches.  Psychiatric/Behavioral:       Feels well managed on Prozac       Past Medical History:  Diagnosis Date  . Anxiety   . Cardiac arrhythmia due to congenital heart disease   . Depression   . Diverticulosis    Sigmoid colon  . Fatty liver 05/2016  . Frequent headaches   . GERD (gastroesophageal reflux disease)   . Heart disease     . Kidney stones   . Kidney stones   . Migraine   . UTI (urinary tract infection)      Social History   Social History  . Marital status: Single    Spouse name: N/A  . Number of children: N/A  . Years of education: N/A   Occupational History  . Not on file.   Social History Main Topics  . Smoking status: Former Smoker    Types: Cigarettes    Quit date: 2008  . Smokeless tobacco: Never Used     Comment: used tobacco "socially"  . Alcohol use No  . Drug use: No  . Sexual activity: Not on file   Other Topics Concern  . Not on file   Social History Narrative   Single.   Has a set of Twins, age 7.   Works for the [employer] and [employer] and [employer]   Enjoys relaxing, spending time with her children.    Past Surgical History:  Procedure Laterality Date  . ABDOMINAL EXPLORATION SURGERY  1986  . NASAL SINUS SURGERY    . TUBAL LIGATION      Family History  Problem Relation Age of Onset  . COPD Father   . Emphysema Father   . Gallstones Mother   . Colon cancer Neg Hx   . Stomach cancer Neg Hx   . Esophageal cancer Neg Hx     No Known Allergies  Current Outpatient Prescriptions on File Prior to Visit  Medication Sig Dispense  Refill  . albuterol (PROVENTIL HFA;VENTOLIN HFA) 108 (90 Base) MCG/ACT inhaler Inhale 1-2 puffs into the lungs every 6 (six) hours as needed for wheezing or shortness of breath (cough). 1 Inhaler 0  . aspirin 81 MG tablet Take 81 mg by mouth daily.    . Chlorpheniramine-PSE-Ibuprofen 2-30-200 MG TABS Take 1-2 tablets by mouth every 6 (six) hours as needed (for cold symptoms).    . dicyclomine (BENTYL) 10 MG capsule Take 1 capsule (10 mg total) by mouth 4 (four) times daily -  before meals and at bedtime. 30 capsule 0  . esomeprazole (NEXIUM) 40 MG capsule TAKE 1 CAPSULE BY MOUTH DAILY 90 capsule 1  . FLUoxetine (PROZAC) 40 MG capsule TAKE ONE CAPSULE BY MOUTH ONCE DAILY 90 capsule 1  . HYDROcodone-acetaminophen (NORCO/VICODIN)  5-325 MG tablet Take 1-2 tablets by mouth every 8 (eight) hours as needed for severe pain. 60 tablet 0  . ibuprofen (ADVIL,MOTRIN) 200 MG tablet Take 400 mg by mouth every 6 (six) hours as needed for pain or headache.    . levothyroxine (SYNTHROID, LEVOTHROID) 50 MCG tablet Take 1 tablet (50 mcg total) by mouth daily. Before breakfast on an empty stomach. 90 tablet 1  . linaclotide (LINZESS) 72 MCG capsule Take 1 capsule (72 mcg total) by mouth daily before breakfast. 30 capsule 11  . meloxicam (MOBIC) 15 MG tablet Take 15 mg by mouth every morning.    . methocarbamol (ROBAXIN) 500 MG tablet Take 500 mg by mouth every 8 (eight) hours as needed for muscle spasms.    . metoprolol tartrate (LOPRESSOR) 25 MG tablet TAKE ONE-HALF TABLET BY MOUTH ONCE DAILY 45 tablet 1  . ranitidine (ZANTAC) 150 MG tablet Take 1 tablet (150 mg total) by mouth at bedtime. 30 tablet 3  . traMADol (ULTRAM) 50 MG tablet Take 50 mg by mouth every 12 (twelve) hours as needed.     . ondansetron (ZOFRAN ODT) 4 MG disintegrating tablet Take 1 tablet (4 mg total) by mouth every 8 (eight) hours as needed for nausea or vomiting. (Patient not taking: Reported on 09/17/2016) 20 tablet 0   Current Facility-Administered Medications on File Prior to Visit  Medication Dose Route Frequency Provider Last Rate Last Dose  . 0.9 %  sodium chloride infusion  500 mL Intravenous Continuous Nandigam, Kavitha V, MD        BP 136/84   Pulse 72   Temp (!) 97.5 F (36.4 C) (Oral)   Ht 5' 2"  (1.575 m)   Wt 184 lb 12.8 oz (83.8 kg)   LMP 08/12/2015   SpO2 97%   BMI 33.80 kg/m    Objective:   Physical Exam  Constitutional: She appears well-nourished.  Neck: Neck supple.  Cardiovascular: Normal rate and regular rhythm.   Pulmonary/Chest: Effort normal and breath sounds normal.  Skin: Skin is warm and dry.  Psychiatric: She has a normal mood and affect.          Assessment & Plan:

## 2016-09-17 NOTE — Assessment & Plan Note (Signed)
Feels well managed on fluoxetine, continue same. Will not prescribe Xanax.

## 2016-09-17 NOTE — Assessment & Plan Note (Signed)
Encouraged her to continue weight loss through exercise and healthy diet.

## 2016-09-17 NOTE — Assessment & Plan Note (Signed)
Following with [facility], feels well managed. Encouraged regular exercise and weight loss. Continue current regimen.

## 2016-09-17 NOTE — Patient Instructions (Signed)
Schedule a lab only appointment during the week of August 6th to recheck your thyroid function.  Please schedule a physical with me in 6 months. You may also schedule a lab only appointment 3-4 days prior. We will discuss your lab results in detail during your physical.  It was a pleasure to see you today!

## 2016-09-24 DIAGNOSIS — M545 Low back pain: Secondary | ICD-10-CM | POA: Diagnosis not present

## 2016-09-24 DIAGNOSIS — M2560 Stiffness of unspecified joint, not elsewhere classified: Secondary | ICD-10-CM | POA: Diagnosis not present

## 2016-09-24 DIAGNOSIS — M6281 Muscle weakness (generalized): Secondary | ICD-10-CM | POA: Diagnosis not present

## 2016-09-30 DIAGNOSIS — M5106 Intervertebral disc disorders with myelopathy, lumbar region: Secondary | ICD-10-CM | POA: Diagnosis not present

## 2016-09-30 DIAGNOSIS — G894 Chronic pain syndrome: Secondary | ICD-10-CM | POA: Diagnosis not present

## 2016-09-30 DIAGNOSIS — Q762 Congenital spondylolisthesis: Secondary | ICD-10-CM | POA: Diagnosis not present

## 2016-09-30 DIAGNOSIS — Z79899 Other long term (current) drug therapy: Secondary | ICD-10-CM | POA: Diagnosis not present

## 2016-09-30 DIAGNOSIS — Z79891 Long term (current) use of opiate analgesic: Secondary | ICD-10-CM | POA: Diagnosis not present

## 2016-10-12 ENCOUNTER — Other Ambulatory Visit (INDEPENDENT_AMBULATORY_CARE_PROVIDER_SITE_OTHER): Payer: 59

## 2016-10-12 ENCOUNTER — Telehealth: Payer: Self-pay | Admitting: Primary Care

## 2016-10-12 ENCOUNTER — Encounter (HOSPITAL_COMMUNITY): Payer: Self-pay | Admitting: Emergency Medicine

## 2016-10-12 ENCOUNTER — Emergency Department (HOSPITAL_COMMUNITY): Payer: 59

## 2016-10-12 ENCOUNTER — Emergency Department (HOSPITAL_COMMUNITY)
Admission: EM | Admit: 2016-10-12 | Discharge: 2016-10-12 | Disposition: A | Discharge status: Home / Self Care | Payer: 59 | Attending: Emergency Medicine | Admitting: Emergency Medicine

## 2016-10-12 DIAGNOSIS — Z791 Long term (current) use of non-steroidal anti-inflammatories (NSAID): Secondary | ICD-10-CM | POA: Diagnosis not present

## 2016-10-12 DIAGNOSIS — E039 Hypothyroidism, unspecified: Secondary | ICD-10-CM

## 2016-10-12 DIAGNOSIS — N200 Calculus of kidney: Secondary | ICD-10-CM | POA: Diagnosis not present

## 2016-10-12 DIAGNOSIS — R1084 Generalized abdominal pain: Secondary | ICD-10-CM | POA: Diagnosis present

## 2016-10-12 DIAGNOSIS — R109 Unspecified abdominal pain: Secondary | ICD-10-CM | POA: Diagnosis not present

## 2016-10-12 DIAGNOSIS — Z87891 Personal history of nicotine dependence: Secondary | ICD-10-CM | POA: Insufficient documentation

## 2016-10-12 DIAGNOSIS — Z79899 Other long term (current) drug therapy: Secondary | ICD-10-CM | POA: Diagnosis not present

## 2016-10-12 LAB — URINALYSIS, ROUTINE W REFLEX MICROSCOPIC
BILIRUBIN URINE: NEGATIVE
Glucose, UA: NEGATIVE mg/dL
Ketones, ur: NEGATIVE mg/dL
LEUKOCYTES UA: NEGATIVE
Nitrite: NEGATIVE
PH: 5 (ref 5.0–8.0)
Protein, ur: 100 mg/dL — AB
SPECIFIC GRAVITY, URINE: 1.03 (ref 1.005–1.030)

## 2016-10-12 LAB — BASIC METABOLIC PANEL
ANION GAP: 11 (ref 5–15)
BUN: 15 mg/dL (ref 6–20)
CHLORIDE: 105 mmol/L (ref 101–111)
CO2: 24 mmol/L (ref 22–32)
CREATININE: 1.04 mg/dL — AB (ref 0.44–1.00)
Calcium: 9.5 mg/dL (ref 8.9–10.3)
GFR calc non Af Amer: >60 mL/min (ref >60)
Glucose, Bld: 121 mg/dL — ABNORMAL HIGH (ref 65–99)
POTASSIUM: 4 mmol/L (ref 3.5–5.1)
SODIUM: 140 mmol/L (ref 135–145)

## 2016-10-12 LAB — CBC
HCT: 40.6 % (ref 36.0–46.0)
HEMOGLOBIN: 13.6 g/dL (ref 12.0–15.0)
MCH: 28.9 pg (ref 26.0–34.0)
MCHC: 33.5 g/dL (ref 30.0–36.0)
MCV: 86.4 fL (ref 78.0–100.0)
PLATELETS: 311 10*3/uL (ref 150–400)
RBC: 4.7 MIL/uL (ref 3.87–5.11)
RDW: 13.2 % (ref 11.5–15.5)
WBC: 11.9 10*3/uL — AB (ref 4.0–10.5)

## 2016-10-12 MED ORDER — KETOROLAC TROMETHAMINE 15 MG/ML IJ SOLN
15.0000 mg | Freq: Once | INTRAMUSCULAR | Status: AC
  Administered 2016-10-12: 15 mg via INTRAVENOUS
  Filled 2016-10-12: qty 1

## 2016-10-12 MED ORDER — OXYCODONE-ACETAMINOPHEN 5-325 MG PO TABS: 1.0000 | ORAL_TABLET | Freq: Four times a day (QID) | ORAL | 0 refills | Status: DC | PRN

## 2016-10-12 MED ORDER — TAMSULOSIN HCL 0.4 MG PO CAPS: 0.4000 mg | ORAL_CAPSULE | Freq: Every day | ORAL | 0 refills | Status: DC

## 2016-10-12 MED ORDER — SODIUM CHLORIDE 0.9 % IV BOLUS (SEPSIS)
1000.0000 mL | Freq: Once | INTRAVENOUS | Status: AC
  Administered 2016-10-12: 1000 mL via INTRAVENOUS

## 2016-10-12 MED ORDER — OXYCODONE-ACETAMINOPHEN 5-325 MG PO TABS
ORAL_TABLET | ORAL | Status: AC
  Administered 2016-10-12: 1
  Filled 2016-10-12: qty 1

## 2016-10-12 MED ORDER — ONDANSETRON 4 MG PO TBDP
4.0000 mg | ORAL_TABLET | Freq: Once | ORAL | Status: AC | PRN
  Administered 2016-10-12: 4 mg via ORAL

## 2016-10-12 MED ORDER — ONDANSETRON 4 MG PO TBDP
ORAL_TABLET | ORAL | Status: AC
  Filled 2016-10-12: qty 1

## 2016-10-12 MED ORDER — OXYCODONE-ACETAMINOPHEN 5-325 MG PO TABS
1.0000 | ORAL_TABLET | ORAL | Status: DC | PRN
  Filled 2016-10-12: qty 1

## 2016-10-12 NOTE — ED Provider Notes (Signed)
[facility] DEPT Provider Note   CSN: [id number] Arrival date & time: 10/12/16  1430     History   Chief Complaint Chief Complaint  Patient presents with  . Flank Pain    HPI Abigail [name] is a 51 y.o. female.  The history is provided by the patient. No language interpreter was used.  Flank Pain    Abigail [name] is a 51 y.o. female who presents to the Emergency Department complaining of flank pain.  Yesterday she developed some lower abdominal discomfort, greatest on the left side. Today about noon she developed severe left upper quadrant and left flank pain. Pain is constant in nature with associated nausea and vomiting. She denies any fevers but has had sweating episodes followed by feeling cold. No diarrhea, constipation, dysuria. She states she has had similar symptoms in the past and has been told she had a kidney stone. Past Medical History:  Diagnosis Date  . Anxiety   . Cardiac arrhythmia due to congenital heart disease   . Depression   . Diverticulosis    Sigmoid colon  . Fatty liver 05/2016  . Frequent headaches   . GERD (gastroesophageal reflux disease)   . Heart disease   . Kidney stones   . Kidney stones   . Migraine   . UTI (urinary tract infection)     Patient Active Problem List   Diagnosis Date Noted  . Hypothyroidism 09/17/2016  . Allergic rhinitis 06/06/2015  . Palpitations 04/11/2015  . Neck pain 06/29/2014  . Right-sided low back pain with right-sided sciatica 06/29/2014  . Esophageal reflux 06/29/2014  . Obesity (BMI 30.0-34.9) 05/23/2013  . Normal coronary arteries- 2012 05/23/2013  . Suspected sleep apnea 05/23/2013  . Anxiety and depression 05/23/2013  . History of medication noncompliance 05/23/2013    Past Surgical History:  Procedure Laterality Date  . ABDOMINAL EXPLORATION SURGERY  1986  . NASAL SINUS SURGERY    . TUBAL LIGATION      OB History    No data available       Home Medications    Prior to  Admission medications   Medication Sig Start Date End Date Taking? Authorizing Provider  albuterol (PROVENTIL HFA;VENTOLIN HFA) 108 (90 Base) MCG/ACT inhaler Inhale 1-2 puffs into the lungs every 6 (six) hours as needed for wheezing or shortness of breath (cough). 05/31/15   [name], NP  aspirin 81 MG tablet Take 81 mg by mouth daily.    [provider]  Chlorpheniramine-PSE-Ibuprofen 2-30-200 MG TABS Take 1-2 tablets by mouth every 6 (six) hours as needed (for cold symptoms).    [provider]  dicyclomine (BENTYL) 10 MG capsule Take 1 capsule (10 mg total) by mouth 4 (four) times daily -  before meals and at bedtime. 05/01/16   [name], NP  esomeprazole (NEXIUM) 40 MG capsule TAKE 1 CAPSULE BY MOUTH DAILY 02/24/16   [name], NP  FLUoxetine (PROZAC) 40 MG capsule TAKE ONE CAPSULE BY MOUTH ONCE DAILY 02/24/16   [name], NP  HYDROcodone-acetaminophen (NORCO/VICODIN) 5-325 MG tablet Take 1-2 tablets by mouth every 8 (eight) hours as needed for severe pain. 02/08/15   [name], NP  ibuprofen (ADVIL,MOTRIN) 200 MG tablet Take 400 mg by mouth every 6 (six) hours as needed for pain or headache.    [provider]  levothyroxine (SYNTHROID, LEVOTHROID) 50 MCG tablet Take 1 tablet (50 mcg total) by mouth daily. Before breakfast on an empty stomach.  09/04/16   Elby Beck, FNP  linaclotide Rolan Lipa) 72 MCG capsule Take 1 capsule (72 mcg total) by mouth daily before breakfast. 08/28/16   Mauri Pole, MD  meloxicam (MOBIC) 15 MG tablet Take 15 mg by mouth every morning. 06/04/16   [provider]  methocarbamol (ROBAXIN) 500 MG tablet Take 500 mg by mouth every 8 (eight) hours as needed for muscle spasms.    [provider]  metoprolol tartrate (LOPRESSOR) 25 MG tablet TAKE ONE-HALF TABLET BY MOUTH ONCE DAILY 06/09/16   [name], NP  ondansetron (ZOFRAN ODT) 4 MG disintegrating tablet Take 1  tablet (4 mg total) by mouth every 8 (eight) hours as needed for nausea or vomiting. Patient not taking: Reported on 09/17/2016 06/17/16   [name], NP  oxyCODONE-acetaminophen (PERCOCET/ROXICET) 5-325 MG tablet Take 1 tablet by mouth every 6 (six) hours as needed for severe pain. 10/12/16   Quintella Reichert, MD  ranitidine (ZANTAC) 150 MG tablet Take 1 tablet (150 mg total) by mouth at bedtime. 06/03/16   Mauri Pole, MD  tamsulosin (FLOMAX) 0.4 MG CAPS capsule Take 1 capsule (0.4 mg total) by mouth daily. 10/12/16   Quintella Reichert, MD  traMADol (ULTRAM) 50 MG tablet Take 50 mg by mouth every 12 (twelve) hours as needed.  08/12/16   [provider]    Family History Family History  Problem Relation Age of Onset  . COPD Father   . Emphysema Father   . Gallstones Mother   . Colon cancer Neg Hx   . Stomach cancer Neg Hx   . Esophageal cancer Neg Hx     Social History Social History  Substance Use Topics  . Smoking status: Former Smoker    Types: Cigarettes    Quit date: 2008  . Smokeless tobacco: Never Used     Comment: used tobacco "socially"  . Alcohol use No     Allergies   Patient has no known allergies.   Review of Systems Review of Systems  Genitourinary: Positive for flank pain.  All other systems reviewed and are negative.    Physical Exam Updated Vital Signs BP 136/75 (BP Location: Right Arm)   Pulse 83   Temp (!) 97.5 F (36.4 C) (Oral)   Resp 17   Ht 5' 2"  (1.575 m)   Wt 83 kg (183 lb)   LMP 08/12/2015   SpO2 98%   BMI 33.47 kg/m   Physical Exam  Constitutional: She is oriented to person, place, and time. She appears well-developed and well-nourished.  HENT:  Head: Normocephalic and atraumatic.  Cardiovascular: Normal rate and regular rhythm.   No murmur heard. Pulmonary/Chest: Effort normal and breath sounds normal. No respiratory distress.  Abdominal: Soft. There is no rebound and no guarding.  Mild left upper quadrant  tenderness  Musculoskeletal: She exhibits no edema or tenderness.  Neurological: She is alert and oriented to person, place, and time.  Skin: Skin is warm and dry.  Psychiatric: She has a normal mood and affect. Her behavior is normal.  Nursing note and vitals reviewed.    ED Treatments / Results  Labs (all labs ordered are listed, but only abnormal results are displayed) Labs Reviewed  URINALYSIS, ROUTINE W REFLEX MICROSCOPIC - Abnormal; Notable for the following:       Result Value   APPearance CLOUDY (*)    Hgb urine dipstick LARGE (*)    Protein, ur 100 (*)    Bacteria, UA RARE (*)  Squamous Epithelial / LPF 0-5 (*)    All other components within normal limits  BASIC METABOLIC PANEL - Abnormal; Notable for the following:    Glucose, Bld 121 (*)    Creatinine, Ser 1.04 (*)    All other components within normal limits  CBC - Abnormal; Notable for the following:    WBC 11.9 (*)    All other components within normal limits    EKG  EKG Interpretation None       Radiology Ct Renal Stone Study  Result Date: 10/12/2016 CLINICAL DATA:  51 year old female with left flank pain. History of UTI and kidney stones. Initial encounter. EXAM: CT ABDOMEN AND PELVIS WITHOUT CONTRAST TECHNIQUE: Multidetector CT imaging of the abdomen and pelvis was performed following the standard protocol without IV contrast. COMPARISON:  04/08/2014 CT abdomen and pelvis. FINDINGS: Lower chest: Basilar subsegmental atelectasis/ scarring. Calcified granuloma right lung base. Prominent epicardial fat. Heart size within normal limits. Hepatobiliary: Fatty liver. Taking into account limitation by non contrast imaging, no worrisome mass. No calcified gallstone. Pancreas: Taking into account limitation by non contrast imaging, no mass or inflammation. Spleen: Taking into account limitation by non contrast imaging, no mass or enlargement. Adrenals/Urinary Tract: 6 x 7 x 8 mm obstructing stone proximal left ureter  located 20 cm proximal to the left ureteral vesical junction causing moderate left hydroureteronephrosis. Several smaller nonobstructing left renal calculi. Posterior mid to upper left renal 1.5 cm hyperdense structure. It is possible this represents hyperdense cyst but would require pre and postcontrast MR for further delineation after acute episode has cleared. Several tiny nonobstructing left renal calculi. No adrenal lesion. Noncontrast filled imaging of the urinary bladder unremarkable. Stomach/Bowel: Portion of bowel and stomach under distended. This limits evaluation for detection of a mass. No extraluminal bowel inflammatory process. Vascular/Lymphatic: Trace calcification right common iliac artery. No aortic aneurysm. No adenopathy. Reproductive: No worrisome abnormality. Other: Scattered peritoneal coarse calcifications may be related to prior granulomatous exposure. No free intraperitoneal air. Musculoskeletal: Bilateral L5 pars defects with grade 1 slip with mild to slightly moderate bilateral foraminal narrowing. IMPRESSION: 6 x 7 x 8 mm obstructing stone proximal left ureter located 20 cm proximal to the left ureteral vesical junction causing moderate left hydroureteronephrosis. Several smaller nonobstructing left renal calculi. Posterior mid to upper left renal 1.5 cm hyperdense structure. It is possible this represents a hyperdense cyst but would require pre and postcontrast MR for further delineation after acute episode has cleared. Several tiny nonobstructing left renal calculi. Fatty liver. 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 Electronically Signed   By: Genia Del M.D.   On: 10/12/2016 19:06    Procedures Procedures (including critical care time)  Medications Ordered in ED Medications  oxyCODONE-acetaminophen (PERCOCET/ROXICET) 5-325 MG per tablet 1 tablet (not administered)  oxyCODONE-acetaminophen (PERCOCET/ROXICET)  5-325 MG per tablet (1 tablet  Given 10/12/16 1607)  ondansetron (ZOFRAN-ODT) disintegrating tablet 4 mg (4 mg Oral Given 10/12/16 1736)  sodium chloride 0.9 % bolus 1,000 mL (0 mLs Intravenous Stopped 10/12/16 1938)  ketorolac (TORADOL) 15 MG/ML injection 15 mg (15 mg Intravenous Given 10/12/16 1901)     Initial Impression / Assessment and Plan / ED Course  I have reviewed the triage vital signs and the nursing notes.  Pertinent labs & imaging results that were available during my care of the patient were reviewed by me and considered in my medical decision making (see chart for details).  Patient here for evaluation of left flank pain. Labs demonstrate mild rise in her creatinine compared to previous. UA without evidence of UTI. CT scan obtained that demonstrates proximal left ureteral stone. Her pain is controlled in the department. Plan to DC home with close urology follow-up. Discussed ED return precautions.  Counseled patient on home care, outpatient follow up and return precautions.   Final Clinical Impressions(s) / ED Diagnoses   Final diagnoses:  Kidney stone    New Prescriptions New Prescriptions   OXYCODONE-ACETAMINOPHEN (PERCOCET/ROXICET) 5-325 MG TABLET    Take 1 tablet by mouth every 6 (six) hours as needed for severe pain.   TAMSULOSIN (FLOMAX) 0.4 MG CAPS CAPSULE    Take 1 capsule (0.4 mg total) by mouth daily.     Quintella Reichert, MD 10/12/16 [phone]

## 2016-10-12 NOTE — Telephone Encounter (Signed)
Patient Name: Ottis Stain RE DOB: 01/27/1966 Initial Comment caller is having severe abdominal pain . mostly on left side Nurse Assessment Nurse: Ronnald Ramp, RN, [place] Date/Time (Eastern Time): 10/12/2016 1:44:33 PM Confirm and document reason for call. If symptomatic, describe symptoms. ---Caller states she is having severe pain in the left side of her abdomen since yesterday. Worse the last 2 hrs. Denies vomiting or diarrhea. Does the patient have any new or worsening symptoms? ---Yes Will a triage be completed? ---Yes Related visit to physician within the last 2 weeks? ---No Does the PT have any chronic conditions? (i.e. diabetes, asthma, etc.) ---Yes List chronic conditions. ---Thyroid, Anxiety, Chronic back pain Is the patient pregnant or possibly pregnant? (Ask all females between the ages of 69-55) ---No Is this a behavioral health or substance abuse call? ---No Guidelines Guideline Title Affirmed Question Affirmed Notes Flank Pain [1] SEVERE pain (e.g., excruciating, scale 8-10) AND [2] present > 1 hour Final Disposition User Go to ED Now Ronnald Ramp, RN, [facility] - ED Disagree/Comply: Comply

## 2016-10-12 NOTE — ED Notes (Signed)
Pt verbalized understanding discharge instructions and denies any further needs or questions at this time. VS stable, ambulatory and steady gait.   

## 2016-10-12 NOTE — ED Triage Notes (Signed)
Pt c/o left sided flank pain that began last night. One episode of nausea/vomiting. Endorses urinary frequency, and amber colored urine.

## 2016-10-12 NOTE — Telephone Encounter (Signed)
Per chart review tab pt went to [facility] ED.

## 2016-10-12 NOTE — Addendum Note (Signed)
Addended by: Daralene Milch C on: 10/12/2016 10:31 AM   Modules accepted: Orders

## 2016-10-12 NOTE — Telephone Encounter (Signed)
Noted. Please check on patient tomorrow. She has been triaged in the emergency department but has not been seen by a provider. Labs ordered and pending.

## 2016-10-13 ENCOUNTER — Encounter: Payer: Self-pay | Admitting: *Deleted

## 2016-10-13 ENCOUNTER — Other Ambulatory Visit: Payer: Self-pay | Admitting: Gastroenterology

## 2016-10-13 LAB — T4, FREE: Free T4: 1.4 ng/dL (ref 0.8–1.8)

## 2016-10-13 LAB — TSH: TSH: 3.37 mIU/L

## 2016-10-13 NOTE — Telephone Encounter (Signed)
Spoken to patient. She stated that she is not much better. She is just have tolerate and hope for the kidney stone will past quickly. She stated that she is going to go see a urologist. She will update if needed.

## 2016-10-13 NOTE — Telephone Encounter (Signed)
Noted  

## 2016-10-15 DIAGNOSIS — N281 Cyst of kidney, acquired: Secondary | ICD-10-CM | POA: Diagnosis not present

## 2016-10-15 DIAGNOSIS — N201 Calculus of ureter: Secondary | ICD-10-CM | POA: Diagnosis not present

## 2016-10-22 DIAGNOSIS — M6281 Muscle weakness (generalized): Secondary | ICD-10-CM | POA: Diagnosis not present

## 2016-10-22 DIAGNOSIS — M2560 Stiffness of unspecified joint, not elsewhere classified: Secondary | ICD-10-CM | POA: Diagnosis not present

## 2016-10-22 DIAGNOSIS — M545 Low back pain: Secondary | ICD-10-CM | POA: Diagnosis not present

## 2016-10-29 DIAGNOSIS — N201 Calculus of ureter: Secondary | ICD-10-CM | POA: Diagnosis not present

## 2016-10-29 DIAGNOSIS — R8271 Bacteriuria: Secondary | ICD-10-CM | POA: Diagnosis not present

## 2016-11-06 DIAGNOSIS — Z79899 Other long term (current) drug therapy: Secondary | ICD-10-CM | POA: Diagnosis not present

## 2016-11-06 DIAGNOSIS — Q762 Congenital spondylolisthesis: Secondary | ICD-10-CM | POA: Diagnosis not present

## 2016-11-06 DIAGNOSIS — G894 Chronic pain syndrome: Secondary | ICD-10-CM | POA: Diagnosis not present

## 2016-11-06 DIAGNOSIS — M5106 Intervertebral disc disorders with myelopathy, lumbar region: Secondary | ICD-10-CM | POA: Diagnosis not present

## 2016-11-06 DIAGNOSIS — Z79891 Long term (current) use of opiate analgesic: Secondary | ICD-10-CM | POA: Diagnosis not present

## 2016-11-17 DIAGNOSIS — N201 Calculus of ureter: Secondary | ICD-10-CM | POA: Diagnosis not present

## 2016-11-25 ENCOUNTER — Other Ambulatory Visit: Payer: Self-pay | Admitting: Urology

## 2016-11-27 ENCOUNTER — Encounter (HOSPITAL_COMMUNITY): Payer: Self-pay | Admitting: General Practice

## 2016-12-01 DIAGNOSIS — N201 Calculus of ureter: Secondary | ICD-10-CM | POA: Diagnosis not present

## 2016-12-02 NOTE — H&P (Signed)
CC: I have ureteral stone.  HPI: Abigail [name] is a 51 year-old female established patient who is here for ureteral stone.  8/9: Patient underwent a CT A/P October 12, 2016. This revealed an 8 mm left proximal stone (visible on scout, 1037, SSD 16 cm), there were other scattered insignificant stones in the kidney of 1 or 2 mm. There was also a hyperdense cyst of 1.5 cm in the left upper pole and an MRI was recommended. UA showed rare bacteria, too numerous to count red cells, no white cells, white count 11.9, creatinine 1.04.   10/29/16: Patient returns today for follow up. She continues to complain of intermittent left flank pain, but says it is manageable with medication. She is also having some intermittent voiding symptoms.   11/17/16: Patient returns today for follow up. She continues to complain of intermittent left flank and abdominal pain. She also has some intermittent nausea. Overall, her pain is fairly well controlled with medication. She denies exacerbation of voiding symptoms. She denies dysuria, fever, chills, or vomiting.   12/01/16: Patient returns today with progressive symptoms. She complains of increasing suprapubic pain and pressure. She also complains of increased urgency and frequency. She has ongoing compalaints of left flank pain. She has nausea, but no vomiting. No gross hematuria, fevers, or chills. She is scheduled for left ESWL on 9/27.   The problem is on the left side. She first stated noticing pain on 10/11/2016. This is not her first kidney stone. She is currently having flank pain and nausea. She denies having back pain, groin pain, vomiting, fever, and chills. Pain is occuring on the left side. She has not caught a stone in her urine strainer since her symptoms began.   She has had eswl for treatment of her stones in the past.     ALLERGIES: Latex    MEDICATIONS: Levothyroxine Sodium 50 mcg tablet  Metoprolol Tartrate 25 mg tablet  Percocet 5 mg-325 mg tablet 1  tablet PO Q 6 H PRN  Tamsulosin Hcl 0.4 mg capsule, ext release 24 hr 1 capsule PO Daily  Tamsulosin Hcl 0.4 mg capsule, ext release 24 hr  Albuterol Sulfate  Aspirin Ec 81 mg tablet, delayed release  Dicyclomine Hcl 10 mg capsule  Esomeprazole Magnesium 40 mg capsule,delayed release  Fluoxetine Hcl 40 mg capsule  Hydrocodone-Acetaminophen 5 mg-325 mg tablet  Ibuprofen 200 mg tablet  Linzess 72 mcg capsule  Meloxicam 15 mg tablet  Methocarbamol 500 mg tablet  Ranitidine Hcl 150 mg capsule  Zofran 4 mg tablet  Zofran Odt 4 mg tablet,disintegrating 1 tablet PO Q 8 H PRN     GU PSH: None   NON-GU PSH: Bilateral Tubal Ligation Exploratory Laparotomy Lip Surgery Procedure - 2002 Sinus surgery    GU PMH: Ureteral calculus - 11/17/2016, - 10/29/2016, - 10/15/2016 Renal cyst - 10/15/2016    NON-GU PMH: Anxiety Arrhythmia Diverticulosis Fatty (change of) liver, not elsewhere classified GERD Major depressive disorder, recurrent, unspecified Migraine, unspecified, not intractable, without status migrainosus    FAMILY HISTORY: Cholelithiasis - Mother copd - Father Emphysema - Father   SOCIAL HISTORY: Marital Status: Divorced Preferred Language: English; Race: White Current Smoking Status: Patient does not smoke anymore. Has not smoked since 10/08/2006.   Tobacco Use Assessment Completed: Used Tobacco in last 30 days? Has never drank.  Patient's occupation [employer].    REVIEW OF SYSTEMS:    GU Review Female:   Patient reports frequent urination, get up at night to  urinate, and leakage of urine. Patient denies hard to postpone urination, burning /pain with urination, stream starts and stops, trouble starting your stream, have to strain to urinate, and being pregnant.  Gastrointestinal (Upper):   Patient reports nausea and indigestion/ heartburn. Patient denies vomiting.  Gastrointestinal (Lower):   Patient denies diarrhea and constipation.  Constitutional:   Patient  reports night sweats and fatigue. Patient denies fever and weight loss.  Skin:   Patient denies skin rash/ lesion and itching.  Eyes:   Patient denies blurred vision and double vision.  Ears/ Nose/ Throat:   Patient denies sore throat and sinus problems.  Hematologic/Lymphatic:   Patient denies swollen glands and easy bruising.  Cardiovascular:   Patient denies leg swelling and chest pains.  Respiratory:   Patient denies cough and shortness of breath.  Endocrine:   Patient denies excessive thirst.  Musculoskeletal:   Patient reports back pain. Patient denies joint pain.  Neurological:   Patient denies headaches and dizziness.  Psychologic:   Patient reports anxiety. Patient denies depression.   VITAL SIGNS:      12/01/2016 10:23 AM  Weight 183 lb / 83.01 kg  Height 62 in / 157.48 cm  BP 121/72 mmHg  Pulse 83 /min  Temperature 98.2 F / 36.7 C  BMI 33.5 kg/m   MULTI-SYSTEM PHYSICAL EXAMINATION:    Constitutional: Obese. No physical deformities. Normally developed. Good grooming.   Respiratory: Normal breath sounds. No labored breathing, no use of accessory muscles.   Cardiovascular: Regular rate and rhythm. No murmur, no gallop. Normal temperature, no swelling, no varicosities.   Skin: No paleness, no jaundice, no cyanosis. No lesion, no ulcer, no rash.  Neurologic / Psychiatric: Oriented to time, oriented to place, oriented to person. No depression, no anxiety, no agitation.  Gastrointestinal: No mass, no tenderness, no rigidity, non obese abdomen.  Musculoskeletal: Spine, ribs, pelvis no bilateral tenderness. Normal gait and station of head and neck.     PAST DATA REVIEWED:  Source Of History:  Patient  Records Review:   Previous Patient Records  Urine Test Review:   Urinalysis, Urine Culture  X-Ray Review: KUB: Reviewed Films.     PROCEDURES:         KUB - [id number]  A single view of the abdomen is obtained. No obvious opacities noted with the confines of the right renal shadow  or along the expected anatomical tract of the right ureter. Small opacity noted within the confines of the left renal shadow. Left proximal ureteral stone still noted at the approximate level of L3. Normal bowel gas pattern. Stable pelvic calcifications noted.         ASSESSMENT:      ICD-10 Details  1 GU:   Ureteral calculus - N20.1    PLAN:           Orders Labs Urine Culture  X-Rays: KUB          Schedule Return Visit/Planned Activity: Keep Scheduled Appointment          Document Letter(s):  Created for Patient: Clinical Summary         Notes:   I will send urine for culture today, but it does not look overlying concerning for infectious process. KUB today shows opacity along the expected anatomical tract of the left ureter, which appears relatively unchanged from prior imagining. Recommended she plan to move forward with ESWL, as planned. We again discussed indications and risks. She voiced understanding. Return precuations given in the interim.  She will keep scheduled appointment.

## 2016-12-03 ENCOUNTER — Encounter (HOSPITAL_COMMUNITY): Payer: Self-pay | Admitting: *Deleted

## 2016-12-03 ENCOUNTER — Encounter (HOSPITAL_COMMUNITY)
Admission: RE | Disposition: A | Discharge status: Home / Self Care | Payer: Self-pay | Source: Ambulatory Visit | Attending: Urology

## 2016-12-03 ENCOUNTER — Ambulatory Visit (HOSPITAL_COMMUNITY)
Admission: RE | Admit: 2016-12-03 | Discharge: 2016-12-03 | Disposition: A | Discharge status: Home / Self Care | Payer: 59 | Source: Ambulatory Visit | Attending: Urology | Admitting: Urology

## 2016-12-03 ENCOUNTER — Ambulatory Visit (HOSPITAL_COMMUNITY): Payer: 59

## 2016-12-03 DIAGNOSIS — Z7982 Long term (current) use of aspirin: Secondary | ICD-10-CM | POA: Insufficient documentation

## 2016-12-03 DIAGNOSIS — K760 Fatty (change of) liver, not elsewhere classified: Secondary | ICD-10-CM | POA: Diagnosis not present

## 2016-12-03 DIAGNOSIS — Z79899 Other long term (current) drug therapy: Secondary | ICD-10-CM | POA: Insufficient documentation

## 2016-12-03 DIAGNOSIS — M549 Dorsalgia, unspecified: Secondary | ICD-10-CM

## 2016-12-03 DIAGNOSIS — Z8719 Personal history of other diseases of the digestive system: Secondary | ICD-10-CM | POA: Insufficient documentation

## 2016-12-03 DIAGNOSIS — Z91040 Latex allergy status: Secondary | ICD-10-CM | POA: Diagnosis not present

## 2016-12-03 DIAGNOSIS — N201 Calculus of ureter: Secondary | ICD-10-CM | POA: Diagnosis not present

## 2016-12-03 DIAGNOSIS — Z87891 Personal history of nicotine dependence: Secondary | ICD-10-CM | POA: Diagnosis not present

## 2016-12-03 DIAGNOSIS — Z6833 Body mass index (BMI) 33.0-33.9, adult: Secondary | ICD-10-CM | POA: Diagnosis not present

## 2016-12-03 DIAGNOSIS — K219 Gastro-esophageal reflux disease without esophagitis: Secondary | ICD-10-CM | POA: Diagnosis not present

## 2016-12-03 DIAGNOSIS — G8929 Other chronic pain: Secondary | ICD-10-CM

## 2016-12-03 DIAGNOSIS — F419 Anxiety disorder, unspecified: Secondary | ICD-10-CM | POA: Diagnosis not present

## 2016-12-03 DIAGNOSIS — R1011 Right upper quadrant pain: Secondary | ICD-10-CM

## 2016-12-03 DIAGNOSIS — E669 Obesity, unspecified: Secondary | ICD-10-CM | POA: Diagnosis not present

## 2016-12-03 DIAGNOSIS — F329 Major depressive disorder, single episode, unspecified: Secondary | ICD-10-CM | POA: Insufficient documentation

## 2016-12-03 HISTORY — PX: EXTRACORPOREAL SHOCK WAVE LITHOTRIPSY: SHX1557

## 2016-12-03 HISTORY — DX: Celiac disease: K90.0

## 2016-12-03 HISTORY — DX: Other intervertebral disc degeneration, lumbar region: M51.36

## 2016-12-03 HISTORY — DX: Other intervertebral disc displacement, lumbar region: M51.26

## 2016-12-03 HISTORY — DX: Other intervertebral disc degeneration, lumbar region without mention of lumbar back pain or lower extremity pain: M51.369

## 2016-12-03 HISTORY — DX: Personal history of urinary calculi: Z87.442

## 2016-12-03 SURGERY — LITHOTRIPSY, ESWL
Anesthesia: LOCAL | Laterality: Left

## 2016-12-03 MED ORDER — ONDANSETRON HCL 4 MG/2ML IJ SOLN
4.0000 mg | Freq: Once | INTRAMUSCULAR | Status: AC
  Administered 2016-12-03: 4 mg via INTRAVENOUS
  Filled 2016-12-03: qty 2

## 2016-12-03 MED ORDER — OXYCODONE HCL 5 MG PO TABS
5.0000 mg | ORAL_TABLET | ORAL | Status: DC | PRN
Start: 2016-12-03 — End: 2016-12-03
  Administered 2016-12-03 (×2): 5 mg via ORAL
  Filled 2016-12-03 (×2): qty 1

## 2016-12-03 MED ORDER — ONDANSETRON 4 MG PO TBDP
4.0000 mg | ORAL_TABLET | Freq: Three times a day (TID) | ORAL | 0 refills | Status: DC | PRN
Start: 2016-12-03 — End: 2018-05-26

## 2016-12-03 MED ORDER — HYDROMORPHONE HCL 1 MG/ML IJ SOLN
1.0000 mg | Freq: Once | INTRAMUSCULAR | Status: AC
  Administered 2016-12-03: 1 mg via INTRAVENOUS

## 2016-12-03 MED ORDER — ACETAMINOPHEN 325 MG PO TABS: 650.0000 mg | ORAL_TABLET | ORAL | Status: DC | PRN

## 2016-12-03 MED ORDER — DIAZEPAM 5 MG PO TABS
10.0000 mg | ORAL_TABLET | ORAL | Status: AC
  Administered 2016-12-03: 10 mg via ORAL
  Filled 2016-12-03: qty 2

## 2016-12-03 MED ORDER — MORPHINE SULFATE (PF) 4 MG/ML IV SOLN: 2.0000 mg | INTRAVENOUS | Status: DC | PRN

## 2016-12-03 MED ORDER — CIPROFLOXACIN HCL 500 MG PO TABS
500.0000 mg | ORAL_TABLET | ORAL | Status: AC
  Administered 2016-12-03: 500 mg via ORAL
  Filled 2016-12-03: qty 1

## 2016-12-03 MED ORDER — HYDROCODONE-ACETAMINOPHEN 5-325 MG PO TABS: 1.0000 | ORAL_TABLET | Freq: Four times a day (QID) | ORAL | 0 refills | Status: DC | PRN

## 2016-12-03 MED ORDER — ACETAMINOPHEN 650 MG RE SUPP
650.0000 mg | RECTAL | Status: DC | PRN
  Filled 2016-12-03: qty 1

## 2016-12-03 MED ORDER — HYDROMORPHONE HCL-NACL 0.5-0.9 MG/ML-% IV SOSY: 1.0000 mg | PREFILLED_SYRINGE | Freq: Once | INTRAVENOUS | Status: DC

## 2016-12-03 MED ORDER — TAMSULOSIN HCL 0.4 MG PO CAPS: 0.4000 mg | ORAL_CAPSULE | Freq: Every day | ORAL | 0 refills | Status: DC

## 2016-12-03 MED ORDER — SODIUM CHLORIDE 0.9% FLUSH: 3.0000 mL | Freq: Two times a day (BID) | INTRAVENOUS | Status: DC

## 2016-12-03 MED ORDER — DIPHENHYDRAMINE HCL 25 MG PO CAPS
25.0000 mg | ORAL_CAPSULE | ORAL | Status: AC
  Administered 2016-12-03: 25 mg via ORAL
  Filled 2016-12-03: qty 1

## 2016-12-03 MED ORDER — SODIUM CHLORIDE 0.9 % IV SOLN
INTRAVENOUS | Status: DC
  Administered 2016-12-03 (×2): via INTRAVENOUS

## 2016-12-03 MED ORDER — SODIUM CHLORIDE 0.9 % IV SOLN: 250.0000 mL | INTRAVENOUS | Status: DC | PRN

## 2016-12-03 MED ORDER — SODIUM CHLORIDE 0.9% FLUSH: 3.0000 mL | INTRAVENOUS | Status: DC | PRN

## 2016-12-03 NOTE — Interval H&P Note (Signed)
History and Physical Interval Note:   Stone hasn't changed on KUB today.   12/03/2016 8:32 AM  [place]  has presented today for surgery, with the diagnosis of LEFT URETERAL STONE  The various methods of treatment have been discussed with the patient and family. After consideration of risks, benefits and other options for treatment, the patient has consented to  Procedure(s): LEFT EXTRACORPOREAL SHOCK WAVE LITHOTRIPSY (ESWL) (Left) as a surgical intervention .  The patient's history has been reviewed, patient examined, no change in status, stable for surgery.  I have reviewed the patient's chart and labs.  Questions were answered to the patient's satisfaction.     Gauri Galvao J

## 2016-12-03 NOTE — Discharge Instructions (Signed)
Lithotripsy, Care After This sheet gives you information about how to care for yourself after your procedure. Your health care provider may also give you more specific instructions. If you have problems or questions, contact your health care provider. What can I expect after the procedure? After the procedure, it is common to have:  Some blood in your urine. This should only last for a few days.  Soreness in your back, sides, or upper abdomen for a few days.  Blotches or bruises on your back where the pressure wave entered the skin.  Pain, discomfort, or nausea when pieces (fragments) of the kidney stone move through the tube that carries urine from the kidney to the bladder (ureter). Stone fragments may pass soon after the procedure, but they may continue to pass for up to 4-8 weeks. ? If you have severe pain or nausea, contact your health care provider. This may be caused by a large stone that was not broken up, and this may mean that you need more treatment.  Some pain or discomfort during urination.  Some pain or discomfort in the lower abdomen or (in men) at the base of the penis.  Follow these instructions at home: Medicines  Take over-the-counter and prescription medicines only as told by your health care provider.  If you were prescribed an antibiotic medicine, take it as told by your health care provider. Do not stop taking the antibiotic even if you start to feel better.  Do not drive for 24 hours if you were given a medicine to help you relax (sedative).  Do not drive or use heavy machinery while taking prescription pain medicine. Eating and drinking  Drink enough water and fluids to keep your urine clear or pale yellow. This helps any remaining pieces of the stone to pass. It can also help prevent new stones from forming.  Eat plenty of fresh fruits and vegetables.  Follow instructions from your health care provider about eating and drinking restrictions. You may be  instructed: ? To reduce how much salt (sodium) you eat or drink. Check ingredients and nutrition facts on packaged foods and beverages. ? To reduce how much meat you eat.  Eat the recommended amount of calcium for your age and gender. Ask your health care provider how much calcium you should have. General instructions  Get plenty of rest.  Most people can resume normal activities 1-2 days after the procedure. Ask your health care provider what activities are safe for you.  If directed, strain all urine through the strainer that was provided by your health care provider. ? Keep all fragments for your health care provider to see. Any stones that are found may be sent to a medical lab for examination. The stone may be as small as a grain of salt.  Keep all follow-up visits as told by your health care provider. This is important. Contact a health care provider if:  You have pain that is severe or does not get better with medicine.  You have nausea that is severe or does not go away.  You have blood in your urine longer than your health care provider told you to expect.  You have more blood in your urine.  You have pain during urination that does not go away.  You urinate more frequently than usual and this does not go away.  You develop a rash or any other possible signs of an allergic reaction. Get help right away if:  You have severe pain in  your back, sides, or upper abdomen.  You have severe pain while urinating.  Your urine is very dark red.  You have blood in your stool (feces).  You cannot pass any urine at all.  You feel a strong urge to urinate after emptying your bladder.  You have a fever or chills.  You develop shortness of breath, difficulty breathing, or chest pain.  You have severe nausea that leads to persistent vomiting.  You faint. Summary  After this procedure, it is common to have some pain, discomfort, or nausea when pieces (fragments) of the  kidney stone move through the tube that carries urine from the kidney to the bladder (ureter). If this pain or nausea is severe, however, you should contact your health care provider.  Most people can resume normal activities 1-2 days after the procedure. Ask your health care provider what activities are safe for you.  Drink enough water and fluids to keep your urine clear or pale yellow. This helps any remaining pieces of the stone to pass, and it can help prevent new stones from forming.  If directed, strain your urine and keep all fragments for your health care provider to see. Fragments or stones may be as small as a grain of salt.  Get help right away if you have severe pain in your back, sides, or upper abdomen or have severe pain while urinating. This information is not intended to replace advice given to you by your health care provider. Make sure you discuss any questions you have with your health care provider. Document Released: 03/15/2007 Document Revised: 01/15/2016 Document Reviewed: 01/15/2016 Elsevier Interactive Patient Education  2017 [employer].

## 2016-12-07 ENCOUNTER — Encounter (HOSPITAL_COMMUNITY): Payer: Self-pay | Admitting: Urology

## 2016-12-08 DIAGNOSIS — N201 Calculus of ureter: Secondary | ICD-10-CM | POA: Diagnosis not present

## 2016-12-08 DIAGNOSIS — R1032 Left lower quadrant pain: Secondary | ICD-10-CM | POA: Diagnosis not present

## 2016-12-10 ENCOUNTER — Other Ambulatory Visit: Payer: Self-pay | Admitting: Primary Care

## 2016-12-10 DIAGNOSIS — R002 Palpitations: Secondary | ICD-10-CM

## 2016-12-17 DIAGNOSIS — N201 Calculus of ureter: Secondary | ICD-10-CM | POA: Diagnosis not present

## 2016-12-30 DIAGNOSIS — N201 Calculus of ureter: Secondary | ICD-10-CM | POA: Diagnosis not present

## 2017-01-08 ENCOUNTER — Ambulatory Visit: Payer: Self-pay | Admitting: *Deleted

## 2017-01-08 ENCOUNTER — Emergency Department (HOSPITAL_COMMUNITY): Payer: 59

## 2017-01-08 ENCOUNTER — Ambulatory Visit: Payer: Self-pay

## 2017-01-08 ENCOUNTER — Encounter (HOSPITAL_COMMUNITY): Payer: Self-pay

## 2017-01-08 ENCOUNTER — Emergency Department (HOSPITAL_COMMUNITY)
Admission: EM | Admit: 2017-01-08 | Discharge: 2017-01-08 | Disposition: A | Discharge status: Home / Self Care | Payer: 59 | Attending: Emergency Medicine | Admitting: Emergency Medicine

## 2017-01-08 ENCOUNTER — Ambulatory Visit (HOSPITAL_COMMUNITY)
Admission: EM | Admit: 2017-01-08 | Discharge: 2017-01-08 | Disposition: A | Discharge status: Home / Self Care | Payer: 59

## 2017-01-08 DIAGNOSIS — F419 Anxiety disorder, unspecified: Secondary | ICD-10-CM | POA: Diagnosis not present

## 2017-01-08 DIAGNOSIS — R079 Chest pain, unspecified: Secondary | ICD-10-CM | POA: Diagnosis not present

## 2017-01-08 DIAGNOSIS — I10 Essential (primary) hypertension: Secondary | ICD-10-CM | POA: Insufficient documentation

## 2017-01-08 DIAGNOSIS — R9431 Abnormal electrocardiogram [ECG] [EKG]: Secondary | ICD-10-CM | POA: Diagnosis not present

## 2017-01-08 DIAGNOSIS — Z91040 Latex allergy status: Secondary | ICD-10-CM | POA: Diagnosis not present

## 2017-01-08 DIAGNOSIS — Z79899 Other long term (current) drug therapy: Secondary | ICD-10-CM | POA: Insufficient documentation

## 2017-01-08 DIAGNOSIS — R0789 Other chest pain: Secondary | ICD-10-CM | POA: Diagnosis not present

## 2017-01-08 DIAGNOSIS — E039 Hypothyroidism, unspecified: Secondary | ICD-10-CM | POA: Diagnosis not present

## 2017-01-08 DIAGNOSIS — Z87891 Personal history of nicotine dependence: Secondary | ICD-10-CM | POA: Diagnosis not present

## 2017-01-08 DIAGNOSIS — F329 Major depressive disorder, single episode, unspecified: Secondary | ICD-10-CM | POA: Diagnosis not present

## 2017-01-08 HISTORY — DX: Essential (primary) hypertension: I10

## 2017-01-08 LAB — BASIC METABOLIC PANEL
ANION GAP: 11 (ref 5–15)
BUN: 7 mg/dL (ref 6–20)
CALCIUM: 8.9 mg/dL (ref 8.9–10.3)
CO2: 22 mmol/L (ref 22–32)
Chloride: 104 mmol/L (ref 101–111)
Creatinine, Ser: 0.66 mg/dL (ref 0.44–1.00)
GLUCOSE: 121 mg/dL — AB (ref 65–99)
Potassium: 3.6 mmol/L (ref 3.5–5.1)
Sodium: 137 mmol/L (ref 135–145)

## 2017-01-08 LAB — CBC
HCT: 39.8 % (ref 36.0–46.0)
HEMOGLOBIN: 13.3 g/dL (ref 12.0–15.0)
MCH: 29 pg (ref 26.0–34.0)
MCHC: 33.4 g/dL (ref 30.0–36.0)
MCV: 86.7 fL (ref 78.0–100.0)
Platelets: 235 10*3/uL (ref 150–400)
RBC: 4.59 MIL/uL (ref 3.87–5.11)
RDW: 13.1 % (ref 11.5–15.5)
WBC: 8.8 10*3/uL (ref 4.0–10.5)

## 2017-01-08 LAB — I-STAT BETA HCG BLOOD, ED (MC, WL, AP ONLY): I-stat hCG, quantitative: <5 m[IU]/mL (ref <5)

## 2017-01-08 LAB — I-STAT TROPONIN, ED: TROPONIN I, POC: 0 ng/mL (ref 0.00–0.08)

## 2017-01-08 NOTE — ED Notes (Signed)
ED Provider at bedside. 

## 2017-01-08 NOTE — Discharge Instructions (Signed)
Your labs, EKG, and chest x-ray today looked good. Follow-up with your cardiologist if you continue having any ongoing issues. Return to the ED for new or worsening symptoms.

## 2017-01-08 NOTE — Telephone Encounter (Signed)
noted 

## 2017-01-08 NOTE — Telephone Encounter (Signed)
I spoke with pt and she is on her way to [facility] rather than ED. Pt said she is having CP now but it is not severe. FYI to Avie Echevaria NP

## 2017-01-08 NOTE — ED Triage Notes (Signed)
Pt endorses central chest pain with left arm and neck pain x 2 days with some shob, nausea and dizziness. VSS.

## 2017-01-08 NOTE — Telephone Encounter (Signed)
Rec'd call from pt.  Reported she spoke with a Triage nurse earlier today, and was advised to go to the nurse at work, to have vital signs checked.   Reported she saw the nurse at work, and was advised her temperature and blood pressure were ok.  Reported BP 130/80.  Was advised to go to the PCP and have an EKG done.  Questioned pt. about her current symptoms. C/o pain in left shoulder to elbow, "a little pain in left side of chest", nausea, neck pain, and headache.  Stated her pain is constant in her left chest and upper arm. Questioned how long the pain has been going on?  The pt. stated "it's been going on for about a week."    Questioned if she has heart palpitations?  Denied any heart palpitations at this time.  She said she was put on Metoprolol, in the past, for an extra heart beat.    Advised that with her combination of symptoms at this time, she should go to the [facility] Emergency Room. Strongly advised that she have someone drive her there.  Pt. stated she didn't really want to have a $3000.00  expense with going to the ER.  Advised that this is the best action to take in evaluating her current symptoms  Pt. verb. understanding.

## 2017-01-08 NOTE — ED Provider Notes (Signed)
[facility] EMERGENCY DEPARTMENT Provider Note   CSN: [id number] Arrival date & time: 01/08/17  1650     History   Chief Complaint Chief Complaint  Patient presents with  . Chest Pain    HPI Abigail [name] is a 51 y.o. female.  The history is provided by the patient and medical records.    51 y.o. F with hx of anxiety, depression, headaches, GERD, HTN, hx of benign cardiac arrhythmia, presenting to the ED with chest pain.  Patient reports she has felt "unwell" for the past week or so.  She reports nasal congestion, sore throat, and some "aches".  She does report aches in the left shoulder and left arm.  States today she did notice some intermittent sharp pains in her chest.  Reports it lasts for about 1 second and feels like a pinprick and then resolves spontaneously.  This does not seem related to exertion.  States she has felt short of breath in the sense that her nose is "congested" and she is mouth breathing a lot.  She denies any dizziness, diaphoresis, or vomiting.  States she has had some nausea but states she also has a current kidney stone and thinks that is causing her nausea.  She has no hx of CAD-- had cath in 2012 which was negative.  Has not followed up with her cardiologist, Dr. Ellyn Hack, in several years.  Does report she has been under some increased stress recently-- she works 2 jobs (one full time and one part time).  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  Today is also her birthday and she has been upset that she had to come to the hospital.  At time of my evaluation, patient states she is feeling better and wants to go home.  Past Medical History:  Diagnosis Date  . Anxiety   . Bulging lumbar disc   . Cardiac arrhythmia due to congenital heart disease   . Celiac disease   . Depression   . Diverticulosis    Sigmoid colon  . Fatty liver 05/2016  . Frequent  headaches   . GERD (gastroesophageal reflux disease)   . Heart disease   . History of kidney stones   . Hypertension   . Kidney stones   . Kidney stones   . Migraine   . UTI (urinary tract infection)     Patient Active Problem List   Diagnosis Date Noted  . Hypothyroidism 09/17/2016  . Allergic rhinitis 06/06/2015  . Palpitations 04/11/2015  . Neck pain 06/29/2014  . Right-sided low back pain with right-sided sciatica 06/29/2014  . Esophageal reflux 06/29/2014  . Obesity (BMI 30.0-34.9) 05/23/2013  . Normal coronary arteries- 2012 05/23/2013  . Suspected sleep apnea 05/23/2013  . Anxiety and depression 05/23/2013  . History of medication noncompliance 05/23/2013    Past Surgical History:  Procedure Laterality Date  . ABDOMINAL EXPLORATION SURGERY  1986  . COLONOSCOPY  06/2016  . EXTRACORPOREAL SHOCK WAVE LITHOTRIPSY Left 12/03/2016   Procedure: LEFT EXTRACORPOREAL SHOCK WAVE LITHOTRIPSY (ESWL);  Surgeon: Irine Seal, MD;  Location: [facility] ORS;  Service: Urology;  Laterality: Left;  . KIDNEY STONE SURGERY    . LITHOTRIPSY    . NASAL SINUS SURGERY    . TUBAL LIGATION      OB History    No data available       Home Medications  Prior to Admission medications   Medication Sig Start Date End Date Taking? Authorizing Provider  albuterol (PROVENTIL HFA;VENTOLIN HFA) 108 (90 Base) MCG/ACT inhaler Inhale 1-2 puffs into the lungs every 6 (six) hours as needed for wheezing or shortness of breath (cough). 05/31/15   [name], NP  Chlorpheniramine-PSE-Ibuprofen 2-30-200 MG TABS Take 1-2 tablets by mouth every 6 (six) hours as needed (for cold symptoms).    [provider]  esomeprazole (NEXIUM) 40 MG capsule TAKE 1 CAPSULE BY MOUTH DAILY 02/24/16   [name], NP  FLUoxetine (PROZAC) 40 MG capsule TAKE ONE CAPSULE BY MOUTH ONCE DAILY 02/24/16   [name], NP  HYDROcodone-acetaminophen (NORCO/VICODIN) 5-325 MG tablet Take 1-2 tablets by mouth  every 6 (six) hours as needed for severe pain. 12/03/16   Irine Seal, MD  ibuprofen (ADVIL,MOTRIN) 200 MG tablet Take 400 mg by mouth every 6 (six) hours as needed for pain or headache.    [provider]  levothyroxine (SYNTHROID, LEVOTHROID) 50 MCG tablet Take 1 tablet (50 mcg total) by mouth daily. Before breakfast on an empty stomach. 09/04/16   Elby Beck, FNP  linaclotide [place]) 72 MCG capsule Take 1 capsule (72 mcg total) by mouth daily before breakfast. 08/28/16   Mauri Pole, MD  meloxicam (MOBIC) 15 MG tablet Take 15 mg by mouth every morning. 06/04/16   [provider]  methocarbamol (ROBAXIN) 500 MG tablet Take 500 mg by mouth every 8 (eight) hours as needed for muscle spasms.    [provider]  metoprolol tartrate (LOPRESSOR) 25 MG tablet TAKE 1/2 (ONE-HALF) TABLET BY MOUTH ONCE DAILY 12/10/16   [name], NP  Multiple Vitamins-Iron (ONE-TABLET-DAILY/IRON PO) Take 1 tablet by mouth daily. Takes 1 every 3 days    [provider]  ondansetron (ZOFRAN ODT) 4 MG disintegrating tablet Take 1 tablet (4 mg total) by mouth every 8 (eight) hours as needed for nausea or vomiting. 12/03/16   Irine Seal, MD  oxyCODONE-acetaminophen (PERCOCET/ROXICET) 5-325 MG tablet Take 1 tablet by mouth every 6 (six) hours as needed for severe pain. 10/12/16   Quintella Reichert, MD  ranitidine (ZANTAC) 150 MG tablet TAKE 1 TABLET BY MOUTH AT BEDTIME 10/13/16   Nandigam, Venia Minks, MD  tamsulosin (FLOMAX) 0.4 MG CAPS capsule Take 1 capsule (0.4 mg total) by mouth daily. 12/03/16   Irine Seal, MD  traMADol (ULTRAM) 50 MG tablet Take 50 mg by mouth every 12 (twelve) hours as needed.  08/12/16   [provider]    Family History Family History  Problem Relation Age of Onset  . COPD Father   . Emphysema Father   . Gallstones Mother   . Colon cancer Neg Hx   . Stomach cancer Neg Hx   . Esophageal cancer Neg Hx     Social History Social History    Substance Use Topics  . Smoking status: Former Smoker    Types: Cigarettes    Quit date: 2008  . Smokeless tobacco: Never Used     Comment: used tobacco "socially"  . Alcohol use No     Allergies   Latex   Review of Systems Review of Systems  Cardiovascular: Positive for chest pain.  Musculoskeletal: Positive for arthralgias.  All other systems reviewed and are negative.    Physical Exam Updated Vital Signs BP 129/80 (BP Location: Right Arm)   Pulse 95   Temp 98.4 F (36.9 C) (Oral)   Resp 18  LMP 08/12/2015 Comment: patient states hx. tubal ligation  SpO2 98%   Physical Exam  Constitutional: She is oriented to person, place, and time. She appears well-developed and well-nourished.  HENT:  Head: Normocephalic and atraumatic.  Right Ear: Tympanic membrane and ear canal normal.  Left Ear: Tympanic membrane and ear canal normal.  Nose: Mucosal edema present.  Mouth/Throat: Oropharynx is clear and moist.  + congestion with PND  Eyes: Pupils are equal, round, and reactive to light. Conjunctivae and EOM are normal.  Neck: Normal range of motion.  Cardiovascular: Normal rate, regular rhythm and normal heart sounds.   Pulmonary/Chest: Effort normal and breath sounds normal. No respiratory distress. She has no wheezes.  Mild tenderness of the left chest wall, no acute deformities, lungs clear bilaterally  Abdominal: Soft. Bowel sounds are normal.  Musculoskeletal: Normal range of motion.  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  Skin: Skin is warm and dry.  Psychiatric: She has a normal mood and affect.  Nursing note and vitals reviewed.    ED Treatments / Results  Labs (all labs ordered are listed, but only abnormal results are  displayed) Labs Reviewed  BASIC METABOLIC PANEL - Abnormal; Notable for the following:       Result Value   Glucose, Bld 121 (*)    All other components within normal limits  CBC  I-STAT TROPONIN, ED  I-STAT BETA HCG BLOOD, ED ([facility], [facility], [facility] ONLY)    EKG  EKG Interpretation None       Radiology Dg Chest 2 View  Result Date: 01/08/2017 CLINICAL DATA:  Chest pain and hypertension EXAM: CHEST  2 VIEW COMPARISON:  February 12, 2016 FINDINGS: There is no edema or consolidation. The heart size and pulmonary vascularity are normal. No adenopathy. No pneumothorax. No bone lesions. IMPRESSION: No edema or consolidation. Electronically Signed   By: Lowella Grip III M.D.   On: 01/08/2017 17:28    Procedures Procedures (including critical care time)  Medications Ordered in ED Medications - No data to display   Initial Impression / Assessment and Plan / ED Course  I have reviewed the triage vital signs and the nursing notes.  Pertinent labs & imaging results that were available during my care of the patient were reviewed by me and considered in my medical decision making (see chart for details).  51 y.o. F here with chest pain.  After talking with her, symptoms seem very atypical as they started with URI symptoms and chest pain lasting about 1 second at a time.  She has no pain currently.  Left upper arm with varicose vein, however patient reports this has been there for years.  No acute findings to suggest DVT.  EKG unchanged from prior.  Screening labs obtained from triage are overall reassuring.  After 1 week of symptoms with negative work-up today, low suspicion for ACS, PE, dissection, or other acute cardiac event.  I feel she is stable for discharge home with OP cardiology follow-up.  We did discuss that stress may be playing a role and she agreed.  She will call Dr. Allison Quarry office on Monday to arrange follow-up.  Discussed plan with patient, she acknowledged understanding and agreed  with plan of care.  Return precautions given for new or worsening symptoms.  Final Clinical Impressions(s) / ED Diagnoses   Final diagnoses:  Atypical chest pain    New Prescriptions Discharge Medication List as of 01/08/2017 11:34 PM       Larene Pickett, PA-C 01/09/17 0055    Orpah Greek, MD 01/09/17 [phone]

## 2017-01-08 NOTE — Telephone Encounter (Signed)
Pt called in with multiple symptoms while driving to work which is the [employer].  Stated she was feeling nauseated since this morning without vomiting.  "I wonder if my BP is high".   Denies having BP problems or being on treatment for same.    Also said her face is red and her neck hurts along with having a HA.  She had 1 episode of feeling chilled this morning but denies feeling like she is running a fever.  She has been feeling this way for a couple of days when questioned how long these symptoms have been going on.  My throat feels funny but it's not sore.   She denied started any new medications or having allergies.  Talking in full sentences without shortness of breath and stated she was driving to work as she was talking to me.   Informed me she had a kidney stone blasted back in Sept.   Denies any symptoms of UTI.  She stated,  "There is a nurse where I work that I can see when I get there in about 20 minutes"   I instructed her to see the nurse and have her BP and temperature checked along with her multiple symptoms.   She assured me she would see the nurse as soon as she gets to work.   Instructed her to call back if she became worse or had further problems.   She acknowledged she would.   Reason for Disposition . [6] Systolic BP  >= 063 OR Diastolic >= 016 AND [0] missed most recent dose of blood pressure medication  Answer Assessment - Initial Assessment Questions 1. BLOOD PRESSURE: "What is the blood pressure?" "Did you take at least two measurements 5 minutes apart?"     I don't know.   Does not have a BP machine 2. ONSET: "When did you take your blood pressure?"     My doctor's office 3. HOW: "How did you obtain the blood pressure?" (e.g., visiting nurse, automatic home BP monitor)     Not done 4. HISTORY: "Do you have a history of high blood pressure?"     No 5. MEDICATIONS: "Are you taking any medications for blood pressure?" "Have you missed any doses recently?"     No 6.  OTHER SYMPTOMS: "Do you have any symptoms?" (e.g., headache, chest pain, blurred vision, difficulty breathing, weakness)     HA, tired, neck hurts, nauseated, throat feels like it wants to close.  7. PREGNANCY: "Is there any chance you are pregnant?" "When was your last menstrual period?"     No  Protocols used: HIGH BLOOD PRESSURE-A-AH

## 2017-01-12 DIAGNOSIS — N201 Calculus of ureter: Secondary | ICD-10-CM | POA: Diagnosis not present

## 2017-02-24 DIAGNOSIS — G894 Chronic pain syndrome: Secondary | ICD-10-CM | POA: Diagnosis not present

## 2017-02-24 DIAGNOSIS — Q762 Congenital spondylolisthesis: Secondary | ICD-10-CM | POA: Diagnosis not present

## 2017-03-04 ENCOUNTER — Other Ambulatory Visit: Payer: Self-pay | Admitting: Family Medicine

## 2017-03-04 DIAGNOSIS — E034 Atrophy of thyroid (acquired): Secondary | ICD-10-CM

## 2017-03-05 NOTE — Telephone Encounter (Signed)
Pt called to check on status of refill, Pt is out of med and concerned about missing dosages.   [address]

## 2017-03-08 NOTE — Telephone Encounter (Signed)
Please advise 

## 2017-03-08 NOTE — Telephone Encounter (Signed)
Will send refill has requested. Last TSH on 10/12/2016

## 2017-05-05 ENCOUNTER — Other Ambulatory Visit: Payer: Self-pay | Admitting: Gastroenterology

## 2017-05-05 ENCOUNTER — Other Ambulatory Visit: Payer: Self-pay | Admitting: Primary Care

## 2017-05-05 DIAGNOSIS — F329 Major depressive disorder, single episode, unspecified: Secondary | ICD-10-CM

## 2017-05-05 DIAGNOSIS — F419 Anxiety disorder, unspecified: Principal | ICD-10-CM

## 2017-05-05 DIAGNOSIS — F32A Depression, unspecified: Secondary | ICD-10-CM

## 2017-05-25 DIAGNOSIS — M545 Low back pain: Secondary | ICD-10-CM | POA: Diagnosis not present

## 2017-05-25 DIAGNOSIS — G894 Chronic pain syndrome: Secondary | ICD-10-CM | POA: Diagnosis not present

## 2017-05-25 DIAGNOSIS — Q762 Congenital spondylolisthesis: Secondary | ICD-10-CM | POA: Diagnosis not present

## 2017-05-25 DIAGNOSIS — Z79899 Other long term (current) drug therapy: Secondary | ICD-10-CM | POA: Diagnosis not present

## 2017-05-25 DIAGNOSIS — Z79891 Long term (current) use of opiate analgesic: Secondary | ICD-10-CM | POA: Diagnosis not present

## 2017-06-07 HISTORY — PX: TRANSTHORACIC ECHOCARDIOGRAM: SHX275

## 2017-06-23 ENCOUNTER — Encounter (INDEPENDENT_AMBULATORY_CARE_PROVIDER_SITE_OTHER): Payer: Self-pay

## 2017-06-23 ENCOUNTER — Ambulatory Visit: Payer: 59 | Admitting: Cardiology

## 2017-06-23 ENCOUNTER — Encounter: Payer: Self-pay | Admitting: Cardiology

## 2017-06-23 VITALS — BP 133/79 | HR 90 | Ht 62.0 in | Wt 192.2 lb

## 2017-06-23 DIAGNOSIS — IMO0001 Reserved for inherently not codable concepts without codable children: Secondary | ICD-10-CM

## 2017-06-23 DIAGNOSIS — I493 Ventricular premature depolarization: Secondary | ICD-10-CM | POA: Diagnosis not present

## 2017-06-23 DIAGNOSIS — R0602 Shortness of breath: Secondary | ICD-10-CM

## 2017-06-23 DIAGNOSIS — E669 Obesity, unspecified: Secondary | ICD-10-CM | POA: Diagnosis not present

## 2017-06-23 DIAGNOSIS — Z0389 Encounter for observation for other suspected diseases and conditions ruled out: Secondary | ICD-10-CM

## 2017-06-23 DIAGNOSIS — Z13220 Encounter for screening for lipoid disorders: Secondary | ICD-10-CM

## 2017-06-23 DIAGNOSIS — R002 Palpitations: Secondary | ICD-10-CM

## 2017-06-23 DIAGNOSIS — R29818 Other symptoms and signs involving the nervous system: Secondary | ICD-10-CM | POA: Diagnosis not present

## 2017-06-23 MED ORDER — METOPROLOL TARTRATE 25 MG PO TABS: 25.0000 mg | ORAL_TABLET | Freq: Two times a day (BID) | ORAL | 3 refills | Status: DC

## 2017-06-23 NOTE — Progress Notes (Signed)
PCP: [name], NP  Clinic Note: Chief Complaint  Patient presents with  . New Patient (Initial Visit)  . Shortness of Breath    frequently  . Chest Pain    at random     HPI: Abigail [name] is a 52 y.o. female with a PMH below who presents today for " a check-up" with her cardiologist.  I fist saw her in May 2012 for chest pain. She had a cath showing normal coronaries and normal LVF. She has been treated with low dose beta blocker for palpitations.    Abigail [name] was last seen on May 13, 2013 by Hinton Rao, PA --> apparently she had run out of her beta-blocker and was noticing palpitations.  She gone to the emergency room.  He revealed beta-blocker and recommended sleep apnea evaluation.    Recent Hospitalizations: ER visit January 08, 2017 for atypical chest pain -> she noted some sharp pains in her chest as well as aching in the left shoulder.  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    Studies Personally Reviewed - (if available, images/films reviewed: From Epic Chart or Care Everywhere)  Jul 28, 2016 - Cardiac Catheterization: Large tortuous coronary arteries no radiographic evidence of disease.  Not anginal chest pain.  Normal EF.  Interval History: Abigail [name] is here today mostly because she realizes that she has not seen a cardiologist in a long time and is having some intermittent episodes that are really difficult to explain.  She notes occasionally feeling short of breath with some exertional dyspnea can sometimes happen with just simply walking versus with notable exertion.  It is not a routine thing.  She has occasional chest discomfort episodes this are slight shooting pains.  She is gone to the emergency room a few times for these.  A lot of times episodes revolve around the time of the death of her eldest son.  She starts thinking about her son and  starts doing this discomfort.  This can also occur during holidays or times a day needs to go medications together. She still has these intermittent episodes of pinprick discomfort or sharp stabbing discomfort in her left side of her chest that can happen with deep inspiration or certain movements -> it is usually seems to radiate to her back.  Thus the more concerning of her chest pains to her.  She does note daytime fatigue and has never had a sleep evaluation.  She still has her intermittent palpitations did not extremely fast just she feels "skipping beats.  This happens when she gets stressed this happens when she is tired.  No PND, orthopnea or edema. No dizziness, weakness or syncope/near syncope. No TIA/amaurosis fugax symptoms. No melena, hematochezia, hematuria, or epstaxis. No claudication.   ROS: A comprehensive was performed. Review of Systems  Constitutional: Positive for malaise/fatigue (Daytime sleepiness).  HENT: Negative for congestion and hearing loss.   Respiratory: Positive for shortness of breath. Negative for cough.   Gastrointestinal: Positive for heartburn (Bad GERD). Negative for abdominal pain and constipation.  Musculoskeletal: Positive for back pain, joint pain and myalgias.       She seems to have lots of musculoskeletal discomfort in her shoulders and hips as well as back.  She takes Vicodin for her back pain.  Not the longer she had kidney stones and was taking Percocet.  She takes Robaxin routinely along with Mobic.  Neurological: Negative for dizziness.  Psychiatric/Behavioral: Negative for substance abuse.  All other systems reviewed and are negative.  I have reviewed and (if needed) personally updated the patient's problem list, medications, allergies, past medical and surgical history, social and family history.   Past Medical History:  Diagnosis Date  . Anxiety   . Bulging lumbar disc   . Cardiac arrhythmia due to congenital heart disease    no  arrhythmia identified   . Celiac disease   . Depression   . Diverticulosis    Sigmoid colon  . Fatty liver 05/2016  . Frequent headaches   . GERD (gastroesophageal reflux disease)   . Heart disease   . History of kidney stones   . Hypertension   . Kidney stones   . Migraine   . Obesity (BMI 35.0-39.9 without comorbidity)   . UTI (urinary tract infection)     Past Surgical History:  Procedure Laterality Date  . ABDOMINAL EXPLORATION SURGERY  1986  . COLONOSCOPY  06/2016  . EXTRACORPOREAL SHOCK WAVE LITHOTRIPSY Left 12/03/2016   Procedure: LEFT EXTRACORPOREAL SHOCK WAVE LITHOTRIPSY (ESWL);  Surgeon: Irine Seal, MD;  Location: [facility];  Service: Urology;  Laterality: Left;  . KIDNEY STONE SURGERY    . LEFT HEART CATH AND CORONARY ANGIOGRAPHY  07/28/2016    Large tortuous coronary arteries no radiographic evidence of disease.  Not anginal chest pain.  Normal EF.  [name] LITHOTRIPSY    . NASAL SINUS SURGERY    . TUBAL LIGATION      Current Meds  Medication Sig  . albuterol (PROVENTIL HFA;VENTOLIN HFA) 108 (90 Base) MCG/ACT inhaler Inhale 1-2 puffs into the lungs every 6 (six) hours as needed for wheezing or shortness of breath (cough).  . Chlorpheniramine-PSE-Ibuprofen 2-30-200 MG TABS Take 1-2 tablets by mouth every 6 (six) hours as needed (for cold symptoms).  [name] esomeprazole (NEXIUM) 40 MG capsule TAKE 1 CAPSULE BY MOUTH DAILY  . FLUoxetine (PROZAC) 40 MG capsule TAKE ONE CAPSULE BY MOUTH ONCE DAILY  . HYDROcodone-acetaminophen (NORCO/VICODIN) 5-325 MG tablet Take 1-2 tablets by mouth every 6 (six) hours as needed for severe pain.  [name] ibuprofen (ADVIL,MOTRIN) 200 MG tablet Take 400 mg by mouth every 6 (six) hours as needed for pain or headache.  . levothyroxine (SYNTHROID, LEVOTHROID) 50 MCG tablet TAKE 1 TABLET BY MOUTH ONCE DAILY ON AN EMPTY STOMACH BEFORE BREAKFAST  . linaclotide (LINZESS) 72 MCG capsule Take 1 capsule (72 mcg total) by mouth daily before breakfast.  . meloxicam  (MOBIC) 15 MG tablet Take 15 mg by mouth every morning.  . methocarbamol (ROBAXIN) 500 MG tablet Take 500 mg by mouth every 8 (eight) hours as needed for muscle spasms.  . Multiple Vitamins-Iron (ONE-TABLET-DAILY/IRON PO) Take 1 tablet by mouth daily. Takes 1 every 3 days  . ondansetron (ZOFRAN ODT) 4 MG disintegrating tablet Take 1 tablet (4 mg total) by mouth every 8 (eight) hours as needed for nausea or vomiting.  [name] oxyCODONE-acetaminophen (PERCOCET/ROXICET) 5-325 MG tablet Take 1 tablet by mouth every 6 (six) hours as needed for severe pain.  . ranitidine (ZANTAC) 150 MG tablet TAKE 1 TABLET BY MOUTH AT BEDTIME  . tamsulosin (FLOMAX) 0.4 MG CAPS capsule Take 1 capsule (0.4 mg total) by mouth daily.  . traMADol (ULTRAM) 50 MG tablet Take 50 mg by mouth every 12 (twelve) hours as needed.   . [DISCONTINUED] metoprolol tartrate (LOPRESSOR) 25 MG tablet TAKE 1/2 (ONE-HALF) TABLET BY  MOUTH ONCE DAILY    Allergies  Allergen Reactions  . Latex Rash    Social History   Tobacco Use  . Smoking status: Former Smoker    Types: Cigarettes    Last attempt to quit: 2008    Years since quitting: 11.3  . Smokeless tobacco: Never Used  . Tobacco comment: used tobacco "socially"  Substance Use Topics  . Alcohol use: No    Alcohol/week: 0.0 oz  . Drug use: No   Social History   Social History Narrative   Single.   Has a set of Twins, age 16.   Works for the [employer] and [employer] and [employer]   Enjoys relaxing, spending time with her children.    family history includes COPD in her father; Emphysema in her father; Gallstones in her mother.  Wt Readings from Last 3 Encounters:  06/23/17 192 lb 3.2 oz (87.2 kg)  11/27/16 183 lb (83 kg)  10/12/16 183 lb (83 kg)    PHYSICAL EXAM BP 133/79   Pulse 90   Ht 5' 2"  (1.575 m)   Wt 192 lb 3.2 oz (87.2 kg)   LMP 08/12/2015 Comment: patient states hx. tubal ligation  BMI 35.15 kg/m  Physical Exam  Constitutional: She is oriented  to person, place, and time. She appears well-developed and well-nourished. No distress.  Moderately obese.  Somewhat shy and demeanor in attitude, but once she starts talking she talks enough  HENT:  Head: Normocephalic and atraumatic.  Mouth/Throat: No oropharyngeal exudate.  Eyes: Pupils are equal, round, and reactive to light. Conjunctivae and EOM are normal. No scleral icterus.  Neck: Normal range of motion. Neck supple. No hepatojugular reflux and no JVD present. Carotid bruit is not present. No tracheal deviation present. No thyromegaly present.  Cardiovascular: Normal rate, regular rhythm, normal heart sounds and normal pulses.  No extrasystoles are present. PMI is not displaced. Exam reveals no gallop, no friction rub and no decreased pulses.  No murmur heard. Pulmonary/Chest: Effort normal and breath sounds normal. No respiratory distress. She has no wheezes. She has no rales. She exhibits tenderness (Point tenderness along the left sternal border).  Abdominal: Soft. Bowel sounds are normal. She exhibits no distension. There is no tenderness. There is no rebound.  Obese.  No HSM  Musculoskeletal: Normal range of motion. She exhibits edema (Trivial).  Neurological: She is alert and oriented to person, place, and time. No cranial nerve deficit.  Skin: Skin is warm and dry. No rash noted. No erythema.  Psychiatric: She has a normal mood and affect. Her behavior is normal. Judgment and thought content normal.  Nursing note and vitals reviewed.    Adult ECG Report  Rate: 84 ;  Rhythm: normal sinus rhythm and NSST-T changes  Normal axsi, intervals & durations;   Narrative Interpretation: relatively normal EKG   Other studies Reviewed: Additional studies/ records that were reviewed today include:  Recent Labs: No recent labs  ASSESSMENT / PLAN: Problem List Items Addressed This Visit    Suspected sleep apnea (Chronic)    There is been some suspicion for possible sleep apnea for years  but she has never had a sleep study.  With her having dyspnea off and on especially with exertion, will check 2D echocardiogram just to ensure that there is no signs of RV failure from pulmonary hypertension.      Relevant Orders   ECHOCARDIOGRAM COMPLETE   Lipid panel   Shortness of breath    Exertional  dyspnea but also sometimes positional.  To exclude CHF, check 2D echo.      Relevant Orders   EKG 12-Lead (Completed)   ECHOCARDIOGRAM COMPLETE   CBC   Comprehensive metabolic panel   Screening for hyperlipidemia    Check lipid panel      Relevant Orders   Lipid panel   PVC's (premature ventricular contractions)    Increase beta-blocker from 12 once a day to 12 twice a day as it is intended to begin. Chronic PVCs relatively benign, however since they seem to bother her more recently, we will check a 2D echo to exclude any structural abnormality or CHF.      Relevant Medications   metoprolol tartrate (LOPRESSOR) 25 MG tablet   Other Relevant Orders   EKG 12-Lead (Completed)   Palpitations - Primary (Chronic)    Off and on.  Unfortunately she is only taking very low dose of Lopressor which the twice daily medicine and only taking it once a day. Changed to twice daily.      Relevant Orders   EKG 12-Lead (Completed)   ECHOCARDIOGRAM COMPLETE   CBC   Comprehensive metabolic panel   Obesity (BMI 30.0-34.9) (Chronic)    The patient understands the need to lose weight with diet and exercise. We have discussed specific strategies for this.      Relevant Orders   Lipid panel   Normal coronary arteries- 2012 (Chronic)    She has been having an episode of chest pain since this heart catheterization in 2012.  Very likely to be cardiac in nature, however we will check for risk factors including lipids and A1c.  Also check 2D echo to ensure that there is no non-ischemic cardiac issues.         I spent a total of 45 minutes with the patient and chart review. >  50% of the time  was spent in direct patient consultation.   Current medicines are reviewed at length with the patient today.  (+/- concerns) taking Lopressor 1/2 tablet daily The following changes have been made:  Changed to twice daily  Patient Instructions  MEDICATION INSTRUCTIONS   CHANGE TO TAKING METOPROLOL TARTRATE 12.5 MG TWICE A DAY  ( 1/2 TABLET OF 25 MG )   LABS- DO NOT EAT OR DRINK THE MORNING OF THE TEST LIPID CMP CBC  TEST WILL SCHEDULE  [place] [address] Your physician has requested that you have an echocardiogram. Echocardiography is a painless test that uses sound waves to create images of your heart. It provides your doctor with information about the size and shape of your heart and how well your heart's chambers and valves are working. This procedure takes approximately one hour. There are no restrictions for this procedure.   Your physician recommends that you schedule a follow-up appointment in [place].     Studies Ordered:   Orders Placed This Encounter  Procedures  . CBC  . Lipid panel  . Comprehensive metabolic panel  . EKG 12-Lead  . ECHOCARDIOGRAM COMPLETE      Glenetta Hew, M.D., M.S. Interventional Cardiologist   Pager # [phone] Phone # [phone] [address] [place], [place] [address]   Thank you for choosing [facility] at [facility]!!

## 2017-06-23 NOTE — Patient Instructions (Signed)
MEDICATION INSTRUCTIONS   CHANGE TO TAKING METOPROLOL TARTRATE 12.5 MG TWICE A DAY  ( 1/2 TABLET OF 25 MG )   LABS- DO NOT EAT OR DRINK THE MORNING OF THE TEST LIPID CMP CBC  TEST WILL SCHEDULE  [place] [address] Your physician has requested that you have an echocardiogram. Echocardiography is a painless test that uses sound waves to create images of your heart. It provides your doctor with information about the size and shape of your heart and how well your heart's chambers and valves are working. This procedure takes approximately one hour. There are no restrictions for this procedure.   Your physician recommends that you schedule a follow-up appointment in [place].

## 2017-06-25 ENCOUNTER — Encounter: Payer: Self-pay | Admitting: Cardiology

## 2017-06-25 NOTE — Assessment & Plan Note (Signed)
Exertional dyspnea but also sometimes positional.  To exclude CHF, check 2D echo.

## 2017-06-25 NOTE — Assessment & Plan Note (Signed)
Check lipid panel  

## 2017-06-25 NOTE — Assessment & Plan Note (Signed)
There is been some suspicion for possible sleep apnea for years but she has never had a sleep study.  With her having dyspnea off and on especially with exertion, will check 2D echocardiogram just to ensure that there is no signs of RV failure from pulmonary hypertension.

## 2017-06-25 NOTE — Assessment & Plan Note (Signed)
She has been having an episode of chest pain since this heart catheterization in 2012.  Very likely to be cardiac in nature, however we will check for risk factors including lipids and A1c.  Also check 2D echo to ensure that there is no non-ischemic cardiac issues.

## 2017-06-25 NOTE — Assessment & Plan Note (Signed)
Off and on.  Unfortunately she is only taking very low dose of Lopressor which the twice daily medicine and only taking it once a day. Changed to twice daily.

## 2017-06-25 NOTE — Assessment & Plan Note (Signed)
The patient understands the need to lose weight with diet and exercise. We have discussed specific strategies for this.  

## 2017-06-25 NOTE — Assessment & Plan Note (Signed)
Increase beta-blocker from 12 once a day to 12 twice a day as it is intended to begin. Chronic PVCs relatively benign, however since they seem to bother her more recently, we will check a 2D echo to exclude any structural abnormality or CHF.

## 2017-06-28 ENCOUNTER — Ambulatory Visit (HOSPITAL_COMMUNITY): Payer: 59

## 2017-06-29 ENCOUNTER — Other Ambulatory Visit: Payer: Self-pay

## 2017-06-29 ENCOUNTER — Ambulatory Visit (HOSPITAL_COMMUNITY): Payer: 59 | Attending: Cardiovascular Disease

## 2017-06-29 DIAGNOSIS — R0602 Shortness of breath: Secondary | ICD-10-CM | POA: Insufficient documentation

## 2017-06-29 DIAGNOSIS — I071 Rheumatic tricuspid insufficiency: Secondary | ICD-10-CM | POA: Insufficient documentation

## 2017-06-29 DIAGNOSIS — R29818 Other symptoms and signs involving the nervous system: Secondary | ICD-10-CM

## 2017-06-29 DIAGNOSIS — R002 Palpitations: Secondary | ICD-10-CM

## 2017-06-30 ENCOUNTER — Telehealth: Payer: Self-pay | Admitting: *Deleted

## 2017-06-30 LAB — LIPID PANEL
CHOL/HDL RATIO: 4.9 ratio — AB (ref 0.0–4.4)
Cholesterol, Total: 197 mg/dL (ref 100–199)
HDL: 40 mg/dL (ref >39)
LDL CALC: 136 mg/dL — AB (ref 0–99)
Triglycerides: 106 mg/dL (ref 0–149)
VLDL CHOLESTEROL CAL: 21 mg/dL (ref 5–40)

## 2017-06-30 LAB — COMPREHENSIVE METABOLIC PANEL
A/G RATIO: 1.8 (ref 1.2–2.2)
ALK PHOS: 98 IU/L (ref 39–117)
ALT: 48 IU/L — ABNORMAL HIGH (ref 0–32)
AST: 26 IU/L (ref 0–40)
Albumin: 4.1 g/dL (ref 3.5–5.5)
BILIRUBIN TOTAL: 0.7 mg/dL (ref 0.0–1.2)
BUN/Creatinine Ratio: 21 (ref 9–23)
BUN: 15 mg/dL (ref 6–24)
CALCIUM: 9.5 mg/dL (ref 8.7–10.2)
CHLORIDE: 104 mmol/L (ref 96–106)
CO2: 24 mmol/L (ref 20–29)
Creatinine, Ser: 0.71 mg/dL (ref 0.57–1.00)
GFR calc Af Amer: 113 mL/min/{1.73_m2} (ref >59)
GFR, EST NON AFRICAN AMERICAN: 98 mL/min/{1.73_m2} (ref >59)
Globulin, Total: 2.3 g/dL (ref 1.5–4.5)
Glucose: 91 mg/dL (ref 65–99)
POTASSIUM: 5.1 mmol/L (ref 3.5–5.2)
Sodium: 144 mmol/L (ref 134–144)
Total Protein: 6.4 g/dL (ref 6.0–8.5)

## 2017-06-30 LAB — CBC
HEMATOCRIT: 43.4 % (ref 34.0–46.6)
HEMOGLOBIN: 14 g/dL (ref 11.1–15.9)
MCH: 28.7 pg (ref 26.6–33.0)
MCHC: 32.3 g/dL (ref 31.5–35.7)
MCV: 89 fL (ref 79–97)
Platelets: 302 10*3/uL (ref 150–379)
RBC: 4.88 x10E6/uL (ref 3.77–5.28)
RDW: 14.2 % (ref 12.3–15.4)
WBC: 8.8 10*3/uL (ref 3.4–10.8)

## 2017-06-30 NOTE — Telephone Encounter (Signed)
Spoke to patient. Result given . Verbalized understanding  

## 2017-06-30 NOTE — Telephone Encounter (Signed)
Pt rtn call to [facility] regarding Echo results, pls call [phone]

## 2017-06-30 NOTE — Telephone Encounter (Signed)
Left detail message on voicemail per DPI ANY QUESTION MAY CALL BACK ,OTHERWISE KEEP APPT FOR 08/05/17

## 2017-06-30 NOTE — Telephone Encounter (Signed)
-----   Message from Leonie Man, MD sent at 06/29/2017  9:04 AM EDT ----- Echo report: Normal EF 60 to 65%.  Likely grade 1-2 diastolic dysfunction. Otherwise normal echo with normal valves.  Normal wall motion.  Glenetta Hew, MD

## 2017-06-30 NOTE — Telephone Encounter (Signed)
Left message to call back- in regards to echo results

## 2017-08-05 ENCOUNTER — Ambulatory Visit: Payer: 59 | Admitting: Cardiology

## 2017-08-05 ENCOUNTER — Encounter: Payer: Self-pay | Admitting: Cardiology

## 2017-08-05 VITALS — BP 119/73 | HR 69 | Ht 62.0 in | Wt 191.2 lb

## 2017-08-05 DIAGNOSIS — Z0389 Encounter for observation for other suspected diseases and conditions ruled out: Secondary | ICD-10-CM

## 2017-08-05 DIAGNOSIS — E1169 Type 2 diabetes mellitus with other specified complication: Secondary | ICD-10-CM | POA: Insufficient documentation

## 2017-08-05 DIAGNOSIS — N281 Cyst of kidney, acquired: Secondary | ICD-10-CM | POA: Diagnosis not present

## 2017-08-05 DIAGNOSIS — I493 Ventricular premature depolarization: Secondary | ICD-10-CM | POA: Diagnosis not present

## 2017-08-05 DIAGNOSIS — IMO0001 Reserved for inherently not codable concepts without codable children: Secondary | ICD-10-CM

## 2017-08-05 DIAGNOSIS — E785 Hyperlipidemia, unspecified: Secondary | ICD-10-CM | POA: Insufficient documentation

## 2017-08-05 DIAGNOSIS — R8271 Bacteriuria: Secondary | ICD-10-CM | POA: Diagnosis not present

## 2017-08-05 DIAGNOSIS — R0602 Shortness of breath: Secondary | ICD-10-CM

## 2017-08-05 DIAGNOSIS — R1084 Generalized abdominal pain: Secondary | ICD-10-CM | POA: Diagnosis not present

## 2017-08-05 DIAGNOSIS — E7849 Other hyperlipidemia: Secondary | ICD-10-CM | POA: Diagnosis not present

## 2017-08-05 DIAGNOSIS — R350 Frequency of micturition: Secondary | ICD-10-CM | POA: Diagnosis not present

## 2017-08-05 NOTE — Progress Notes (Signed)
PCP: [name], NP  Clinic Note: Chief Complaint  Patient presents with  . Follow-up    1 months;  . Shortness of Breath    on exertion     HPI: Abigail [name] is a 52 y.o. female with a PMH below who presents today for " a check-up" with her cardiologist.  I fist saw her in May 2012 for chest pain. She had a cath showing normal coronaries and normal LVF. She has been treated with low dose beta blocker for palpitations.    Mylo Driskill Coor was last seen on June 23, 2017 with episodes of shortness of breath exertional dyspnea and some palpitations.-We will check 2D echocardiogram reviewed below.  Recent Hospitalizations: n/a  Studies Personally Reviewed - (if available, images/films reviewed: From Epic Chart or Care Everywhere)  06/2017 Echo: EF 60-65%.  GR 1-2 DD.  Otherwise normal echo.  Normal valve function.  Normal wall motion.   Interval History: Abigail [name] is here today for follow-up stating that she actually is doing better.  Her shortness of breath is improving a little bit because she is getting more active and she is not really noticing that palpitations as much since we went back to twice daily beta-blocker. She still has intermittent tweaking type chest discomfort but is not associate with any particular activity.  As the stress in her life calms down, her atypical symptoms are improving.  She otherwise is denying any significant resting or exertional chest tightness or pressure.  No PND, orthopnea or edema.  No syncope/near syncope or TIAs or amaurosis fugax.  No claudication  ROS: A comprehensive was performed. Review of Systems  Constitutional: Positive for malaise/fatigue (Daytime sleepiness).  HENT: Negative for congestion and hearing loss.   Respiratory: Positive for shortness of breath (Only if she overdoes it). Negative for cough.   Gastrointestinal: Positive for heartburn (Bad GERD). Negative for abdominal pain and constipation.    Genitourinary: Negative for hematuria.  Musculoskeletal: Positive for back pain, joint pain and myalgias.       She seems to have lots of musculoskeletal discomfort in her shoulders and hips as well as back.  She takes Vicodin for her back pain.  Not the longer she had kidney stones and was taking Percocet.  She takes Robaxin routinely along with Mobic.  Neurological: Negative for dizziness, focal weakness and weakness.  Psychiatric/Behavioral: Negative for substance abuse. The patient is nervous/anxious.   All other systems reviewed and are negative.  I have reviewed and (if needed) personally updated the patient's problem list, medications, allergies, past medical and surgical history, social and family history.   Past Medical History:  Diagnosis Date  . Anxiety   . Bulging lumbar disc   . Cardiac arrhythmia due to congenital heart disease    no arrhythmia identified   . Celiac disease   . Depression   . Diverticulosis    Sigmoid colon  . Fatty liver 05/2016  . Frequent headaches   . GERD (gastroesophageal reflux disease)   . Heart disease   . History of kidney stones   . Hypertension   . Kidney stones   . Migraine   . Obesity (BMI 35.0-39.9 without comorbidity)   . UTI (urinary tract infection)     Past Surgical History:  Procedure Laterality Date  . ABDOMINAL EXPLORATION SURGERY  1986  . COLONOSCOPY  06/2016  . EXTRACORPOREAL SHOCK WAVE LITHOTRIPSY Left 12/03/2016   Procedure: LEFT EXTRACORPOREAL SHOCK WAVE LITHOTRIPSY (ESWL);  Surgeon: Irine Seal,  MD;  Location: [facility];  Service: Urology;  Laterality: Left;  . KIDNEY STONE SURGERY    . LEFT HEART CATH AND CORONARY ANGIOGRAPHY  07/28/2016    Large tortuous coronary arteries no radiographic evidence of disease.  Not anginal chest pain.  Normal EF.  Abigail [name] LITHOTRIPSY    . NASAL SINUS SURGERY    . TRANSTHORACIC ECHOCARDIOGRAM  06/2017   EF 60-65%.  GR 1-2 DD.  Otherwise normal echo.  Normal valve function.  Normal wall  motion.  . TUBAL LIGATION      Current Meds  Medication Sig  . albuterol (PROVENTIL HFA;VENTOLIN HFA) 108 (90 Base) MCG/ACT inhaler Inhale 1-2 puffs into the lungs every 6 (six) hours as needed for wheezing or shortness of breath (cough).  . Chlorpheniramine-PSE-Ibuprofen 2-30-200 MG TABS Take 1-2 tablets by mouth every 6 (six) hours as needed (for cold symptoms).  Abigail [name] esomeprazole (NEXIUM) 40 MG capsule TAKE 1 CAPSULE BY MOUTH DAILY  . FLUoxetine (PROZAC) 40 MG capsule TAKE ONE CAPSULE BY MOUTH ONCE DAILY  . HYDROcodone-acetaminophen (NORCO/VICODIN) 5-325 MG tablet Take 1-2 tablets by mouth every 6 (six) hours as needed for severe pain.  Abigail [name] ibuprofen (ADVIL,MOTRIN) 200 MG tablet Take 400 mg by mouth every 6 (six) hours as needed for pain or headache.  . levothyroxine (SYNTHROID, LEVOTHROID) 50 MCG tablet TAKE 1 TABLET BY MOUTH ONCE DAILY ON AN EMPTY STOMACH BEFORE BREAKFAST  . linaclotide (LINZESS) 72 MCG capsule Take 1 capsule (72 mcg total) by mouth daily before breakfast.  . meloxicam (MOBIC) 15 MG tablet Take 15 mg by mouth every morning.  . methocarbamol (ROBAXIN) 500 MG tablet Take 500 mg by mouth every 8 (eight) hours as needed for muscle spasms.  . metoprolol tartrate (LOPRESSOR) 25 MG tablet Take 1 tablet (25 mg total) by mouth 2 (two) times daily.  . Multiple Vitamins-Iron (ONE-TABLET-DAILY/IRON PO) Take 1 tablet by mouth daily. Takes 1 every 3 days  . ondansetron (ZOFRAN ODT) 4 MG disintegrating tablet Take 1 tablet (4 mg total) by mouth every 8 (eight) hours as needed for nausea or vomiting.  Abigail [name] oxyCODONE-acetaminophen (PERCOCET/ROXICET) 5-325 MG tablet Take 1 tablet by mouth every 6 (six) hours as needed for severe pain.  . ranitidine (ZANTAC) 150 MG tablet TAKE 1 TABLET BY MOUTH AT BEDTIME  . tamsulosin (FLOMAX) 0.4 MG CAPS capsule Take 1 capsule (0.4 mg total) by mouth daily.  . traMADol (ULTRAM) 50 MG tablet Take 50 mg by mouth every 12 (twelve) hours as needed.     Allergies    Allergen Reactions  . Latex Rash    Social History   Tobacco Use  . Smoking status: Former Smoker    Types: Cigarettes    Last attempt to quit: 2008    Years since quitting: 11.4  . Smokeless tobacco: Never Used  . Tobacco comment: used tobacco "socially"  Substance Use Topics  . Alcohol use: No    Alcohol/week: 0.0 oz  . Drug use: No   Social History   Social History Narrative   Single.   Has a set of Twins, age 56.   Works for the [employer] and [employer] and [employer]   Enjoys relaxing, spending time with her children.    family history includes COPD in her father; Emphysema in her father; Gallstones in her mother.  Wt Readings from Last 3 Encounters:  08/05/17 191 lb 3.2 oz (86.7 kg)  06/23/17 192 lb 3.2 oz (87.2 kg)  11/27/16 183 lb (83  kg)    PHYSICAL EXAM BP 119/73   Pulse 69   Ht 5' 2"  (1.575 m)   Wt 191 lb 3.2 oz (86.7 kg)   LMP 08/12/2015 Comment: patient states hx. tubal ligation  BMI 34.97 kg/m  Physical Exam  Constitutional: She is oriented to person, place, and time. She appears well-developed and well-nourished. No distress.  Moderately obese.  Somewhat shy and demeanor in attitude, but once she starts talking she talks enough  HENT:  Head: Normocephalic and atraumatic.  Neck: Normal range of motion. Neck supple. No hepatojugular reflux and no JVD present. Carotid bruit is not present.  Cardiovascular: Normal rate, regular rhythm, normal heart sounds and normal pulses.  No extrasystoles are present. PMI is not displaced. Exam reveals no gallop and no friction rub.  No murmur heard. Pulmonary/Chest: Effort normal and breath sounds normal. No respiratory distress. She has no wheezes. She has no rales. She exhibits tenderness (Point tenderness along the left sternal border).  Abdominal: Soft. Bowel sounds are normal. She exhibits no distension. There is no tenderness. There is no rebound.  Obese.  No HSM  Musculoskeletal: Normal range of  motion. She exhibits edema (Trivial).  Neurological: She is alert and oriented to person, place, and time.  Psychiatric: She has a normal mood and affect. Her behavior is normal. Judgment and thought content normal.  Nursing note and vitals reviewed.    Adult ECG Report n/a she will continue her twice daily metoprolol  Other studies Reviewed: Additional studies/ records that were reviewed today include:  Recent Labs:  Lab Results  Component Value Date   CHOL 197 06/29/2017   HDL 40 06/29/2017   LDLCALC 136 (H) 06/29/2017   TRIG 106 06/29/2017   CHOLHDL 4.9 (H) 06/29/2017   Lab Results  Component Value Date   CREATININE 0.71 06/29/2017   BUN 15 06/29/2017   NA 144 06/29/2017   K 5.1 06/29/2017   CL 104 06/29/2017   CO2 24 06/29/2017     ASSESSMENT / PLAN: Problem List Items Addressed This Visit    Shortness of breath   PVC's (premature ventricular contractions) - Primary   Normal coronary arteries- 2012 (Chronic)   Hyperlipidemia due to dietary fat intake   Relevant Orders   Hepatic function panel   Lipid panel     Overall, her dyspnea symptoms seem to have improved.  She does have episodes that seem consistent with anxiety driven shortness of breath or chest tightness.  However at baseline she is not having the symptoms.  She says that when she is increased her dose of metoprolol, the palpitations and a lot of this seemingly stressful shortness of breath and tightness in her chest is has not alleviated.  She indicates that it is hard for her to exercise because of her back issues, but she we will try to get involved with physical therapy and working out of gym and efforts to lose weight but will also adjust her diet to avoid being on a statin.  I will see her back in 6 months to reassess her lipid panel and palpitations.  She will continue twice daily metoprolol, and we can reassess her lipid panel at that time -consider statin therapy if lifestyle modification not  working.   Current medicines are reviewed at length with the patient today.  (+/- concerns) n/a The following changes have been made: n/a  Patient Instructions  No medication changes    Your physician discussed the importance of regular exercise and  recommended that you start or continue a regular exercise program for good health.     Labs  Hepatic level Lipids Due in 6 months before next visit Will mail your labslip at that time.     Your physician wants you to follow-up in 6 months with DR Jermane Brayboy.You will receive a reminder letter in the mail two months in advance. If you don't receive a letter, please call our office to schedule the follow-up appointment.     Studies Ordered:   Orders Placed This Encounter  Procedures  . Hepatic function panel  . Lipid panel      Glenetta Hew, M.D., M.S. Interventional Cardiologist   Pager # [phone] Phone # [phone] [address] [place], [place] [address]   Thank you for choosing [facility] at [facility]!!

## 2017-08-05 NOTE — Patient Instructions (Signed)
No medication changes    Your physician discussed the importance of regular exercise and recommended that you start or continue a regular exercise program for good health.     Labs  Hepatic level Lipids Due in 6 months before next visit Will mail your labslip at that time.     Your physician wants you to follow-up in 6 months with DR HARDING.You will receive a reminder letter in the mail two months in advance. If you don't receive a letter, please call our office to schedule the follow-up appointment.

## 2017-08-06 DIAGNOSIS — M545 Low back pain: Secondary | ICD-10-CM | POA: Diagnosis not present

## 2017-08-06 DIAGNOSIS — G894 Chronic pain syndrome: Secondary | ICD-10-CM | POA: Diagnosis not present

## 2017-08-06 DIAGNOSIS — Q762 Congenital spondylolisthesis: Secondary | ICD-10-CM | POA: Diagnosis not present

## 2017-08-08 ENCOUNTER — Encounter: Payer: Self-pay | Admitting: Cardiology

## 2017-08-11 DIAGNOSIS — N281 Cyst of kidney, acquired: Secondary | ICD-10-CM | POA: Diagnosis not present

## 2017-08-11 DIAGNOSIS — N201 Calculus of ureter: Secondary | ICD-10-CM | POA: Diagnosis not present

## 2017-08-23 DIAGNOSIS — N201 Calculus of ureter: Secondary | ICD-10-CM | POA: Diagnosis not present

## 2017-09-03 ENCOUNTER — Other Ambulatory Visit: Payer: Self-pay | Admitting: Primary Care

## 2017-09-03 DIAGNOSIS — E034 Atrophy of thyroid (acquired): Secondary | ICD-10-CM

## 2017-09-13 ENCOUNTER — Other Ambulatory Visit: Payer: Self-pay | Admitting: Primary Care

## 2017-09-13 ENCOUNTER — Other Ambulatory Visit: Payer: Self-pay | Admitting: Gastroenterology

## 2017-09-13 DIAGNOSIS — R109 Unspecified abdominal pain: Secondary | ICD-10-CM | POA: Diagnosis not present

## 2017-09-13 DIAGNOSIS — F419 Anxiety disorder, unspecified: Principal | ICD-10-CM

## 2017-09-13 DIAGNOSIS — R1084 Generalized abdominal pain: Secondary | ICD-10-CM | POA: Diagnosis not present

## 2017-09-13 DIAGNOSIS — F32A Depression, unspecified: Secondary | ICD-10-CM

## 2017-09-13 DIAGNOSIS — R350 Frequency of micturition: Secondary | ICD-10-CM | POA: Diagnosis not present

## 2017-09-13 DIAGNOSIS — F329 Major depressive disorder, single episode, unspecified: Secondary | ICD-10-CM

## 2017-09-13 DIAGNOSIS — N201 Calculus of ureter: Secondary | ICD-10-CM | POA: Diagnosis not present

## 2017-09-21 ENCOUNTER — Ambulatory Visit
Admission: RE | Admit: 2017-09-21 | Discharge: 2017-09-21 | Disposition: A | Discharge status: Home / Self Care | Payer: No Typology Code available for payment source | Source: Ambulatory Visit | Attending: Nurse Practitioner | Admitting: Nurse Practitioner

## 2017-09-21 ENCOUNTER — Other Ambulatory Visit: Payer: Self-pay | Admitting: Nurse Practitioner

## 2017-09-21 DIAGNOSIS — M25511 Pain in right shoulder: Secondary | ICD-10-CM

## 2017-10-15 DIAGNOSIS — M545 Low back pain: Secondary | ICD-10-CM | POA: Diagnosis not present

## 2017-10-15 DIAGNOSIS — Q762 Congenital spondylolisthesis: Secondary | ICD-10-CM | POA: Diagnosis not present

## 2017-10-15 DIAGNOSIS — G894 Chronic pain syndrome: Secondary | ICD-10-CM | POA: Diagnosis not present

## 2017-10-27 ENCOUNTER — Other Ambulatory Visit: Payer: Self-pay | Admitting: Primary Care

## 2017-10-27 DIAGNOSIS — F419 Anxiety disorder, unspecified: Principal | ICD-10-CM

## 2017-10-27 DIAGNOSIS — F32A Depression, unspecified: Secondary | ICD-10-CM

## 2017-10-27 DIAGNOSIS — F329 Major depressive disorder, single episode, unspecified: Secondary | ICD-10-CM

## 2017-10-27 MED ORDER — FLUOXETINE HCL 40 MG PO CAPS: 40.0000 mg | ORAL_CAPSULE | Freq: Every day | ORAL | 0 refills | Status: DC

## 2017-10-27 NOTE — Telephone Encounter (Signed)
Copied from [place] [id number]. Topic: Quick Communication - Rx Refill/Question >> Oct 27, 2017  8:41 AM Oliver Pila B wrote: Medication: levothyroxine (SYNTHROID, LEVOTHROID) 50 MCG tablet [[id number]]   Pt is needing the medication above; pt notified that pcp is out of the office; pt states she cannot wait for pcp to get back to get medication; pt wants a phone call to be informed on what else can be done

## 2017-10-27 NOTE — Telephone Encounter (Signed)
I spoke with Maudie Mercury at [facility]. And pt has # 30 left of levothyroxine 50 mcg and Maudie Mercury will get ready for pick up. I spoke with pt and advised # 30 would be ready for pick up later today. Pt also request refill Prozac 40 mg #30 also refilled. Pt schedule med refill appt on 11/09/17; there was not CPX appt available in next 30 days. Pt will schedule CPX at 11/09/17 appt.

## 2017-11-04 DIAGNOSIS — R351 Nocturia: Secondary | ICD-10-CM | POA: Diagnosis not present

## 2017-11-04 DIAGNOSIS — N3946 Mixed incontinence: Secondary | ICD-10-CM | POA: Diagnosis not present

## 2017-11-04 DIAGNOSIS — N201 Calculus of ureter: Secondary | ICD-10-CM | POA: Diagnosis not present

## 2017-11-09 ENCOUNTER — Ambulatory Visit: Payer: 59 | Admitting: Primary Care

## 2017-11-15 ENCOUNTER — Ambulatory Visit: Payer: 59 | Admitting: Primary Care

## 2017-11-15 ENCOUNTER — Encounter: Payer: Self-pay | Admitting: Primary Care

## 2017-11-15 VITALS — BP 124/76 | HR 72 | Temp 98.0°F | Ht 62.0 in | Wt 193.5 lb

## 2017-11-15 DIAGNOSIS — E669 Obesity, unspecified: Secondary | ICD-10-CM | POA: Diagnosis not present

## 2017-11-15 DIAGNOSIS — G8929 Other chronic pain: Secondary | ICD-10-CM

## 2017-11-15 DIAGNOSIS — E66811 Obesity, class 1: Secondary | ICD-10-CM

## 2017-11-15 DIAGNOSIS — E034 Atrophy of thyroid (acquired): Secondary | ICD-10-CM

## 2017-11-15 DIAGNOSIS — J309 Allergic rhinitis, unspecified: Secondary | ICD-10-CM

## 2017-11-15 DIAGNOSIS — R059 Cough, unspecified: Secondary | ICD-10-CM

## 2017-11-15 DIAGNOSIS — F419 Anxiety disorder, unspecified: Secondary | ICD-10-CM

## 2017-11-15 DIAGNOSIS — K219 Gastro-esophageal reflux disease without esophagitis: Secondary | ICD-10-CM

## 2017-11-15 DIAGNOSIS — M545 Low back pain: Secondary | ICD-10-CM

## 2017-11-15 DIAGNOSIS — E7849 Other hyperlipidemia: Secondary | ICD-10-CM | POA: Diagnosis not present

## 2017-11-15 DIAGNOSIS — F329 Major depressive disorder, single episode, unspecified: Secondary | ICD-10-CM

## 2017-11-15 DIAGNOSIS — R05 Cough: Secondary | ICD-10-CM | POA: Diagnosis not present

## 2017-11-15 DIAGNOSIS — F32A Depression, unspecified: Secondary | ICD-10-CM

## 2017-11-15 LAB — HEMOGLOBIN A1C: HEMOGLOBIN A1C: 5.7 % (ref 4.6–6.5)

## 2017-11-15 LAB — LIPID PANEL
CHOL/HDL RATIO: 6
CHOLESTEROL: 203 mg/dL — AB (ref 0–200)
HDL: 35 mg/dL — ABNORMAL LOW (ref >39.00)
LDL CALC: 137 mg/dL — AB (ref 0–99)
NONHDL: 168.18
Triglycerides: 157 mg/dL — ABNORMAL HIGH (ref 0.0–149.0)
VLDL: 31.4 mg/dL (ref 0.0–40.0)

## 2017-11-15 LAB — BASIC METABOLIC PANEL
BUN: 15 mg/dL (ref 6–23)
CALCIUM: 9 mg/dL (ref 8.4–10.5)
CO2: 29 mEq/L (ref 19–32)
Chloride: 103 mEq/L (ref 96–112)
Creatinine, Ser: 0.66 mg/dL (ref 0.40–1.20)
GFR: 99.63 mL/min (ref >60.00)
GLUCOSE: 99 mg/dL (ref 70–99)
POTASSIUM: 4.7 meq/L (ref 3.5–5.1)
SODIUM: 138 meq/L (ref 135–145)

## 2017-11-15 LAB — TSH: TSH: 2.99 u[IU]/mL (ref 0.35–4.50)

## 2017-11-15 MED ORDER — FLUOXETINE HCL 40 MG PO CAPS: 40.0000 mg | ORAL_CAPSULE | Freq: Every day | ORAL | 3 refills | Status: DC

## 2017-11-15 MED ORDER — LEVOTHYROXINE SODIUM 50 MCG PO TABS: ORAL_TABLET | ORAL | 3 refills | Status: DC

## 2017-11-15 MED ORDER — BENZONATATE 200 MG PO CAPS: 200.0000 mg | ORAL_CAPSULE | Freq: Three times a day (TID) | ORAL | 0 refills | Status: DC | PRN

## 2017-11-15 MED ORDER — ALBUTEROL SULFATE HFA 108 (90 BASE) MCG/ACT IN AERS: 1.0000 | INHALATION_SPRAY | Freq: Four times a day (QID) | RESPIRATORY_TRACT | 0 refills | Status: DC | PRN

## 2017-11-15 NOTE — Assessment & Plan Note (Signed)
Check A1C and lipids. Discussed the importance of a healthy diet and regular exercise in order for weight loss, and to reduce the risk of any potential medical problems.

## 2017-11-15 NOTE — Assessment & Plan Note (Signed)
Feels well managed on fluoxetine 40 mg, continue same. She will notify when she's ready to wean off, recommended she hold of for now given increased stressors at home.

## 2017-11-15 NOTE — Assessment & Plan Note (Signed)
Repeat TSH pending. Discussed to take levothyroxine with water only, no food or other medications for 30 minutes, Nexium 4 hours or later.  Continue levothyroxine 50 mcg for now, will made adjustments if needed.

## 2017-11-15 NOTE — Assessment & Plan Note (Signed)
Following with [facility]. Continue current regimen.

## 2017-11-15 NOTE — Assessment & Plan Note (Signed)
Doing well on Nexium and Zantac. Discussed to take Nexium at least 4 hours after levothyroxine. Recommended to work on weight loss.

## 2017-11-15 NOTE — Assessment & Plan Note (Signed)
Repeat lipids pending.

## 2017-11-15 NOTE — Progress Notes (Signed)
Subjective:    Patient ID: Abigail [name], female    DOB: 1965-06-20, 52 y.o.   MRN: [id number]  HPI  Abigail [name] is a 52 year old female who presents today for follow up.  1) Hypothyroidism: Currently managed on levothyroxine 50 mcg. Her last TSH was 3.37 one year ago. She is taking her levothyroxine in the morning with water, waiting 30 minutes prior to eating, does take with her Nexium with levothyroxine.   2) Recurrent Renal Stones: Following with Urology, 2 residual smaller, non obstructive renal stones. No longer using tamsulosin.   3) Exertional Dyspnea/Palpitations: Currently following with cardiology. Echo in April 2019 with EF of 60-65% with Grade 1 diastolic dysfunction, otherwise normal. During her follow up visit in late May 2019 she endorsed feeling better. She's managed on metoprolol 25 mg BID.  4) Anxiety and Depression: Currently managed on fluoxetine 40 mg daily. Overall feels well managed. Denies SI/HI. She'd like to wean off of her medication at some point as she thinks it's causing weight gain, not ready to at this point as she has increased stress at home.   5) Chronic Constipation/GERD: Currently managed on Linzess 72 mcg PRN. She is following with GI. Also managed on Nexium and Zantac. Overall feels well managed.   6) Chronic Back Pain: Currently following with [facility]. She is managed on Norco, Tramadol, and Robaxin. No plans for surgery now.   7) Allergic Rhinitis: Uses albuterol inhaler very sparingly during seasonal changes or with cold symptoms. She denies daily wheezing and shortness of breath. She'd like to have some benzonatate capsules on hand during cold season. She is also needing a refill of the albuterol inhaler.   Review of Systems  Respiratory: Negative for shortness of breath and wheezing.   Cardiovascular: Negative for chest pain.  Gastrointestinal:       GERD, intermittent constipation  Musculoskeletal: Positive for back  pain.  Allergic/Immunologic: Positive for environmental allergies.  Neurological: Negative for dizziness and headaches.  Psychiatric/Behavioral:       Feels well managed on Fluoxetine        Past Medical History:  Diagnosis Date  . Anxiety   . Bulging lumbar disc   . Cardiac arrhythmia due to congenital heart disease    no arrhythmia identified   . Celiac disease   . Depression   . Diverticulosis    Sigmoid colon  . Fatty liver 05/2016  . Frequent headaches   . GERD (gastroesophageal reflux disease)   . Heart disease   . History of kidney stones   . Hypertension   . Kidney stones   . Migraine   . Obesity (BMI 35.0-39.9 without comorbidity)   . UTI (urinary tract infection)      Social History   Socioeconomic History  . Marital status: Single    Spouse name: Not on file  . Number of children: Not on file  . Years of education: Not on file  . Highest education level: Not on file  Occupational History  . Not on file  Social Needs  . Financial resource strain: Not on file  . Food insecurity:    Worry: Not on file    Inability: Not on file  . Transportation needs:    Medical: Not on file    Non-medical: Not on file  Tobacco Use  . Smoking status: Former Smoker    Types: Cigarettes    Last attempt to quit: 2008    Years since quitting:  11.6  . Smokeless tobacco: Never Used  . Tobacco comment: used tobacco "socially"  Substance and Sexual Activity  . Alcohol use: No    Alcohol/week: 0.0 standard drinks  . Drug use: No  . Sexual activity: Not on file  Lifestyle  . Physical activity:    Days per week: Not on file    Minutes per session: Not on file  . Stress: Not on file  Relationships  . Social connections:    Talks on phone: Not on file    Gets together: Not on file    Attends religious service: Not on file    Active member of club or organization: Not on file    Attends meetings of clubs or organizations: Not on file    Relationship status: Not on  file  . Intimate partner violence:    Fear of current or ex partner: Not on file    Emotionally abused: Not on file    Physically abused: Not on file    Forced sexual activity: Not on file  Other Topics Concern  . Not on file  Social History Narrative   Single.   Has a set of Twins, age 55.   Works for the [employer] and [employer] and [employer]   Enjoys relaxing, spending time with her children.    Past Surgical History:  Procedure Laterality Date  . ABDOMINAL EXPLORATION SURGERY  1986  . COLONOSCOPY  06/2016  . EXTRACORPOREAL SHOCK WAVE LITHOTRIPSY Left 12/03/2016   Procedure: LEFT EXTRACORPOREAL SHOCK WAVE LITHOTRIPSY (ESWL);  Surgeon: Irine Seal, MD;  Location: [facility];  Service: Urology;  Laterality: Left;  . KIDNEY STONE SURGERY    . LEFT HEART CATH AND CORONARY ANGIOGRAPHY  07/28/2016    Large tortuous coronary arteries no radiographic evidence of disease.  Not anginal chest pain.  Normal EF.  [name] LITHOTRIPSY    . NASAL SINUS SURGERY    . TRANSTHORACIC ECHOCARDIOGRAM  06/2017   EF 60-65%.  GR 1-2 DD.  Otherwise normal echo.  Normal valve function.  Normal wall motion.  . TUBAL LIGATION      Family History  Problem Relation Age of Onset  . COPD Father   . Emphysema Father   . Gallstones Mother   . Colon cancer Neg Hx   . Stomach cancer Neg Hx   . Esophageal cancer Neg Hx     Allergies  Allergen Reactions  . Latex Rash    Current Outpatient Medications on File Prior to Visit  Medication Sig Dispense Refill  . Chlorpheniramine-PSE-Ibuprofen 2-30-200 MG TABS Take 1-2 tablets by mouth every 6 (six) hours as needed (for cold symptoms).    [name] esomeprazole (NEXIUM) 40 MG capsule TAKE 1 CAPSULE BY MOUTH DAILY 90 capsule 1  . HYDROcodone-acetaminophen (NORCO/VICODIN) 5-325 MG tablet Take 1-2 tablets by mouth every 6 (six) hours as needed for severe pain. 20 tablet 0  . ibuprofen (ADVIL,MOTRIN) 200 MG tablet Take 400 mg by mouth every 6 (six) hours as needed for pain  or headache.    . linaclotide (LINZESS) 72 MCG capsule Take 1 capsule (72 mcg total) by mouth daily before breakfast. 30 capsule 11  . meloxicam (MOBIC) 15 MG tablet Take 15 mg by mouth every morning.    . methocarbamol (ROBAXIN) 500 MG tablet Take 500 mg by mouth every 8 (eight) hours as needed for muscle spasms.    . Multiple Vitamins-Iron (ONE-TABLET-DAILY/IRON PO) Take 1 tablet by mouth daily. Takes 1 every 3 days    .  ondansetron (ZOFRAN ODT) 4 MG disintegrating tablet Take 1 tablet (4 mg total) by mouth every 8 (eight) hours as needed for nausea or vomiting. 20 tablet 0  . ranitidine (ZANTAC) 150 MG tablet TAKE 1 TABLET BY MOUTH AT BEDTIME 30 tablet 3  . traMADol (ULTRAM) 50 MG tablet Take 50 mg by mouth every 12 (twelve) hours as needed.     . metoprolol tartrate (LOPRESSOR) 25 MG tablet Take 1 tablet (25 mg total) by mouth 2 (two) times daily. 180 tablet 3   No current facility-administered medications on file prior to visit.     BP 124/76   Pulse 72   Temp 98 F (36.7 C) (Oral)   Ht 5' 2"  (1.575 m)   Wt 193 lb 8 oz (87.8 kg)   LMP 08/12/2015 Comment: patient states hx. tubal ligation  SpO2 98%   BMI 35.39 kg/m    Objective:   Physical Exam  Constitutional: She appears well-nourished.  Neck: Neck supple.  Cardiovascular: Normal rate and regular rhythm.  Respiratory: Effort normal and breath sounds normal.  Skin: Skin is warm and dry.  Psychiatric: She has a normal mood and affect.           Assessment & Plan:

## 2017-11-15 NOTE — Assessment & Plan Note (Addendum)
Intermittent during seasonal changes, overall doing well.  Refill provided for albuterol inhaler.

## 2017-11-15 NOTE — Patient Instructions (Addendum)
Stop by the lab prior to leaving today. I will notify you of your results once received.   Remember to take the levothyroxine with water only, no food or other medications for 30 minutes. The Nexium should be taken 4 hours after or later.   Start exercising. You should be getting 150 minutes of moderate intensity exercise weekly.  It's important to improve your diet by reducing consumption of fast food, fried food, processed snack foods, sugary drinks. Increase consumption of fresh vegetables and fruits, whole grains, water.  Ensure you are drinking 64 ounces of water daily.  Please notify me if you decide to wean off of fluoxetine.   It was a pleasure to see you today!

## 2017-11-16 ENCOUNTER — Encounter: Payer: Self-pay | Admitting: *Deleted

## 2017-11-23 ENCOUNTER — Telehealth: Payer: Self-pay

## 2017-11-23 NOTE — Telephone Encounter (Signed)
   [facility] Pre-operative Risk Assessment    Request for surgical clearance:  1. What type of surgery is being performed? Right shoulder arthroscopy    2. When is this surgery scheduled?  TBD   3. What type of clearance is required (medical clearance vs. Pharmacy clearance to hold med vs. Both)? Medical  4. Are there any medications that need to be held prior to surgery and how long? Not listed   5. Practice name and name of physician performing surgery? [facility], Isabella Stalling, MD  6. What is your office phone number [phone]    7.   What is your office fax number [phone]  8.   Anesthesia type (None, local, MAC, general) ? Choice   Jacqulynn Cadet 11/23/2017, 4:42 PM  _________________________________________________________________   (provider comments below)

## 2017-11-24 NOTE — Telephone Encounter (Signed)
   Primary Cardiologist: Glenetta Hew, MD  Chart reviewed as part of pre-operative protocol coverage. Patient was contacted 11/24/2017 in reference to pre-operative risk assessment for pending surgery as outlined below.  Abigail [name] was last seen on 08/05/17 by Dr. Ellyn Hack.  Since that day, Abigail [name] has done well w/o any cardiac symptoms. She is able to ambulate a flight of stairs > 4 METs of physical activity w/o exertional CP or dyspnea.   Therefore, based on ACC/AHA guidelines, the patient would be at acceptable risk for the planned procedure without further cardiovascular testing.   Her meds have been reviewed. There are no current cardiac meds that need to be held prior to surgery.   I will route this recommendation to the requesting party via Epic fax function and remove from pre-op pool.  Please call with questions.  Lyda Jester, PA-C 11/24/2017, 2:44 PM

## 2017-11-24 NOTE — Telephone Encounter (Signed)
   Primary Cardiologist:David Ellyn Hack, MD  Chart reviewed as part of pre-operative protocol coverage. Last seen by Dr. Ellyn Hack 07/2017. Phone call placed 11/24/17. LVM for pt to call back regarding clearance.   Lyda Jester, PA-C 11/24/2017, 1:41 PM

## 2017-12-07 DIAGNOSIS — Q762 Congenital spondylolisthesis: Secondary | ICD-10-CM | POA: Diagnosis not present

## 2017-12-07 DIAGNOSIS — G894 Chronic pain syndrome: Secondary | ICD-10-CM | POA: Diagnosis not present

## 2017-12-07 DIAGNOSIS — M545 Low back pain: Secondary | ICD-10-CM | POA: Diagnosis not present

## 2017-12-07 DIAGNOSIS — Z79891 Long term (current) use of opiate analgesic: Secondary | ICD-10-CM | POA: Diagnosis not present

## 2017-12-07 DIAGNOSIS — Z79899 Other long term (current) drug therapy: Secondary | ICD-10-CM | POA: Diagnosis not present

## 2017-12-18 ENCOUNTER — Other Ambulatory Visit: Payer: Self-pay | Admitting: Gastroenterology

## 2017-12-27 HISTORY — PX: ROTATOR CUFF REPAIR: SHX139

## 2017-12-29 ENCOUNTER — Ambulatory Visit: Payer: Self-pay | Admitting: *Deleted

## 2017-12-29 ENCOUNTER — Emergency Department (HOSPITAL_COMMUNITY): Payer: 59

## 2017-12-29 ENCOUNTER — Observation Stay (HOSPITAL_COMMUNITY)
Admission: EM | Admit: 2017-12-29 | Discharge: 2017-12-31 | Disposition: A | Discharge status: Home / Self Care | Payer: 59 | Attending: Internal Medicine | Admitting: Internal Medicine

## 2017-12-29 ENCOUNTER — Encounter (HOSPITAL_COMMUNITY): Payer: Self-pay

## 2017-12-29 ENCOUNTER — Telehealth: Payer: Self-pay | Admitting: Cardiology

## 2017-12-29 DIAGNOSIS — J9811 Atelectasis: Secondary | ICD-10-CM | POA: Diagnosis not present

## 2017-12-29 DIAGNOSIS — Z7982 Long term (current) use of aspirin: Secondary | ICD-10-CM | POA: Diagnosis not present

## 2017-12-29 DIAGNOSIS — F419 Anxiety disorder, unspecified: Secondary | ICD-10-CM | POA: Diagnosis not present

## 2017-12-29 DIAGNOSIS — G8929 Other chronic pain: Secondary | ICD-10-CM | POA: Diagnosis not present

## 2017-12-29 DIAGNOSIS — R0602 Shortness of breath: Secondary | ICD-10-CM | POA: Diagnosis not present

## 2017-12-29 DIAGNOSIS — I2510 Atherosclerotic heart disease of native coronary artery without angina pectoris: Secondary | ICD-10-CM | POA: Diagnosis not present

## 2017-12-29 DIAGNOSIS — R1011 Right upper quadrant pain: Secondary | ICD-10-CM

## 2017-12-29 DIAGNOSIS — R0902 Hypoxemia: Secondary | ICD-10-CM | POA: Diagnosis not present

## 2017-12-29 DIAGNOSIS — J181 Lobar pneumonia, unspecified organism: Secondary | ICD-10-CM | POA: Diagnosis not present

## 2017-12-29 DIAGNOSIS — R9431 Abnormal electrocardiogram [ECG] [EKG]: Secondary | ICD-10-CM | POA: Diagnosis not present

## 2017-12-29 DIAGNOSIS — Z79899 Other long term (current) drug therapy: Secondary | ICD-10-CM | POA: Diagnosis not present

## 2017-12-29 DIAGNOSIS — Z9889 Other specified postprocedural states: Secondary | ICD-10-CM | POA: Diagnosis not present

## 2017-12-29 DIAGNOSIS — K8681 Exocrine pancreatic insufficiency: Secondary | ICD-10-CM | POA: Insufficient documentation

## 2017-12-29 DIAGNOSIS — J189 Pneumonia, unspecified organism: Secondary | ICD-10-CM | POA: Diagnosis not present

## 2017-12-29 DIAGNOSIS — F329 Major depressive disorder, single episode, unspecified: Secondary | ICD-10-CM | POA: Insufficient documentation

## 2017-12-29 DIAGNOSIS — I10 Essential (primary) hypertension: Secondary | ICD-10-CM | POA: Insufficient documentation

## 2017-12-29 DIAGNOSIS — Z87442 Personal history of urinary calculi: Secondary | ICD-10-CM | POA: Diagnosis not present

## 2017-12-29 DIAGNOSIS — F32A Depression, unspecified: Secondary | ICD-10-CM | POA: Diagnosis present

## 2017-12-29 DIAGNOSIS — K900 Celiac disease: Secondary | ICD-10-CM | POA: Diagnosis not present

## 2017-12-29 DIAGNOSIS — M549 Dorsalgia, unspecified: Secondary | ICD-10-CM

## 2017-12-29 DIAGNOSIS — Z6835 Body mass index (BMI) 35.0-35.9, adult: Secondary | ICD-10-CM | POA: Diagnosis not present

## 2017-12-29 DIAGNOSIS — Z87891 Personal history of nicotine dependence: Secondary | ICD-10-CM | POA: Diagnosis not present

## 2017-12-29 DIAGNOSIS — K219 Gastro-esophageal reflux disease without esophagitis: Secondary | ICD-10-CM | POA: Insufficient documentation

## 2017-12-29 DIAGNOSIS — K760 Fatty (change of) liver, not elsewhere classified: Secondary | ICD-10-CM | POA: Insufficient documentation

## 2017-12-29 DIAGNOSIS — Z791 Long term (current) use of non-steroidal anti-inflammatories (NSAID): Secondary | ICD-10-CM | POA: Diagnosis not present

## 2017-12-29 DIAGNOSIS — Z880 Allergy status to penicillin: Secondary | ICD-10-CM | POA: Diagnosis not present

## 2017-12-29 DIAGNOSIS — E669 Obesity, unspecified: Secondary | ICD-10-CM | POA: Diagnosis not present

## 2017-12-29 DIAGNOSIS — Z79890 Hormone replacement therapy (postmenopausal): Secondary | ICD-10-CM | POA: Diagnosis not present

## 2017-12-29 DIAGNOSIS — E039 Hypothyroidism, unspecified: Secondary | ICD-10-CM | POA: Diagnosis present

## 2017-12-29 LAB — CBC WITH DIFFERENTIAL/PLATELET
Abs Immature Granulocytes: 0.13 10*3/uL — ABNORMAL HIGH (ref 0.00–0.07)
BASOS ABS: 0.1 10*3/uL (ref 0.0–0.1)
Basophils Relative: 1 %
EOS PCT: 5 %
Eosinophils Absolute: 0.5 10*3/uL (ref 0.0–0.5)
HEMATOCRIT: 38.8 % (ref 36.0–46.0)
HEMOGLOBIN: 11.8 g/dL — AB (ref 12.0–15.0)
Immature Granulocytes: 1 %
LYMPHS ABS: 2.7 10*3/uL (ref 0.7–4.0)
LYMPHS PCT: 24 %
MCH: 28 pg (ref 26.0–34.0)
MCHC: 30.4 g/dL (ref 30.0–36.0)
MCV: 92.2 fL (ref 80.0–100.0)
MONO ABS: 0.7 10*3/uL (ref 0.1–1.0)
Monocytes Relative: 6 %
NRBC: 0 % (ref 0.0–0.2)
Neutro Abs: 7.2 10*3/uL (ref 1.7–7.7)
Neutrophils Relative %: 63 %
Platelets: 270 10*3/uL (ref 150–400)
RBC: 4.21 MIL/uL (ref 3.87–5.11)
RDW: 13.5 % (ref 11.5–15.5)
WBC: 11.2 10*3/uL — ABNORMAL HIGH (ref 4.0–10.5)

## 2017-12-29 LAB — COMPREHENSIVE METABOLIC PANEL
ALBUMIN: 3.3 g/dL — AB (ref 3.5–5.0)
ALK PHOS: 76 U/L (ref 38–126)
ALT: 31 U/L (ref 0–44)
ANION GAP: 7 (ref 5–15)
AST: 26 U/L (ref 15–41)
BILIRUBIN TOTAL: 0.5 mg/dL (ref 0.3–1.2)
BUN: 15 mg/dL (ref 6–20)
CALCIUM: 8.7 mg/dL — AB (ref 8.9–10.3)
CO2: 27 mmol/L (ref 22–32)
CREATININE: 0.74 mg/dL (ref 0.44–1.00)
Chloride: 107 mmol/L (ref 98–111)
GFR calc Af Amer: >60 mL/min (ref >60)
GFR calc non Af Amer: >60 mL/min (ref >60)
GLUCOSE: 98 mg/dL (ref 70–99)
Potassium: 3.9 mmol/L (ref 3.5–5.1)
Sodium: 141 mmol/L (ref 135–145)
TOTAL PROTEIN: 6.5 g/dL (ref 6.5–8.1)

## 2017-12-29 LAB — TROPONIN I: Troponin I: <0.03 ng/mL (ref <0.03)

## 2017-12-29 MED ORDER — SODIUM CHLORIDE 0.9 % IV SOLN
1.0000 g | Freq: Once | INTRAVENOUS | Status: AC
  Administered 2017-12-29: 1 g via INTRAVENOUS
  Filled 2017-12-29: qty 10

## 2017-12-29 MED ORDER — AZITHROMYCIN 500 MG IV SOLR
500.0000 mg | Freq: Once | INTRAVENOUS | Status: AC
  Administered 2017-12-29: 500 mg via INTRAVENOUS
  Filled 2017-12-29: qty 500

## 2017-12-29 MED ORDER — IOPAMIDOL (ISOVUE-370) INJECTION 76%
100.0000 mL | Freq: Once | INTRAVENOUS | Status: AC | PRN
  Administered 2017-12-29: 100 mL via INTRAVENOUS

## 2017-12-29 MED ORDER — IOPAMIDOL (ISOVUE-370) INJECTION 76%
INTRAVENOUS | Status: AC
  Filled 2017-12-29: qty 100

## 2017-12-29 MED ORDER — ONDANSETRON HCL 4 MG/2ML IJ SOLN
4.0000 mg | Freq: Once | INTRAMUSCULAR | Status: AC
  Administered 2017-12-29: 4 mg via INTRAVENOUS
  Filled 2017-12-29: qty 2

## 2017-12-29 MED ORDER — OXYCODONE-ACETAMINOPHEN 5-325 MG PO TABS
1.0000 | ORAL_TABLET | Freq: Once | ORAL | Status: AC
  Administered 2017-12-29: 1 via ORAL
  Filled 2017-12-29: qty 1

## 2017-12-29 NOTE — Telephone Encounter (Signed)
New Message           Pt c/o Shortness Of Breath: STAT if SOB developed within the last 24 hours or pt is noticeably SOB on the phone  1. Are you currently SOB (can you hear that pt is SOB on the phone)? YES  2. How long have you been experiencing SOB? 3 days   3. Are you SOB when sitting or when up moving around? Both  4. Are you currently experiencing any other symptoms? Pain between elbow and shoulder blade.

## 2017-12-29 NOTE — Telephone Encounter (Signed)
Pt states had rotator cuff repair surgery, right shoulder, Monday. States SOB began that night at home. Reports spoke with surgeons. office Tuesday and reported symptoms, referred to cardiologist.  Pt states called cardiologist today and directed to call PCP to address anxiety. Presently, pt reports SOB with minimal exertion and at rest, can not lie down. HR during call 72. States tightness "At diaphragm area, right under rib cage." Sharp pain at times at rib area. States "May go into back but hard to tell with pain from surgery that started today.'  Pt reports some anxiety "But it's because I can't take a deep breath." Pt's speech halting, speaking in short phrases at times. Also reports mild intermittent lightheadedness. Pt directed to ED; states her daughter will drive.  Reason for Disposition . [1] MODERATE difficulty breathing (e.g., speaks in phrases, SOB even at rest, pulse 100-120) AND [2] NEW-onset or WORSE than normal    Rotator Cuff repair surgery Monday. SOB worsening, onset Monday night.  Answer Assessment - Initial Assessment Questions 1. RESPIRATORY STATUS: "Describe your breathing?" (e.g., wheezing, shortness of breath, unable to speak, severe coughing)      SOB at rest, can not ay flat,Slight wheezing a times. Can't take a deep breath 2. ONSET: "When did this breathing problem begin?"      Monday since home after shoulder surgery 3. PATTERN "Does the difficult breathing come and go, or has it been constant since it started?"      Constant now 4. SEVERITY: "How bad is your breathing?" (e.g., mild, moderate, severe)    - MILD: No SOB at rest, mild SOB with walking, speaks normally in sentences, can lay down, no retractions, pulse < 100.    - MODERATE: SOB at rest, SOB with minimal exertion and prefers to sit, cannot lie down flat, speaks in phrases, mild retractions, audible wheezing, pulse 100-120.    - SEVERE: Very SOB at rest, speaks in single words, struggling to breathe, sitting  hunched forward, retractions, pulse > 120      Moderate 5. RECURRENT SYMPTOM: "Have you had difficulty breathing before?" If so, ask: "When was the last time?" and "What happened that time?"      no 6. CARDIAC HISTORY: "Do you have any history of heart disease?" (e.g., heart attack, angina, bypass surgery, angioplasty)      HTN, PVCs 7. LUNG HISTORY: "Do you have any history of lung disease?"  (e.g., pulmonary embolus, asthma, emphysema)     no 8. CAUSE: "What do you think is causing the breathing problem?"      Rotator cuff repair Monday; SOB started that evening at home. 9. OTHER SYMPTOMS: "Do you have any other symptoms? (e.g., dizziness, runny nose, cough, chest pain, fever)     Nausea, mild intermittent lightheadedness  Protocols used: BREATHING DIFFICULTY-A-AH

## 2017-12-29 NOTE — ED Triage Notes (Signed)
Per [facility]: Pt had rotator cuff surgery on Monday, has been feeling short of breath ever since. No other complaints. Pt has increased work of breathing when trying to speak full sentences. No retractions, no accessory muscles. Lung sounds CTA. 95% on room air. 20 g L hand.

## 2017-12-29 NOTE — ED Provider Notes (Signed)
[facility] EMERGENCY DEPARTMENT Provider Note   CSN: [id number] Arrival date & time: 12/29/17  1857     History   Chief Complaint Chief Complaint  Patient presents with  . Shortness of Breath    HPI GAO MITNICK is a 52 y.o. female with a history of celiac disease, CAD, HTN, and nephrolithiasis who presents to the emergency department by EMS with a chief complaint of dyspnea.  Patient underwent repair of her right rotator cuff 2 days ago with Dr. Tamera Punt.  She has been using an incentive spirometer at home as directed after discharge.  She endorses constant, worsening dyspnea over the last 3 days that significantly worsened today.  Dyspnea is worse with exertion. During our interview, she has to stop every few words to take a breath. She also endorses intermittent, non-radiating pain, described as "tightness" under her right rib cage.  No known aggravating or alleviating factors.  Reports she has been more diaphoretic and "hot" since the procedure, but has not checked her temperature.  She does have nausea and intermittent cough, but no emesis, constipation, diarrhea, headache, leg swelling, or lightheadedness.  States that she has been more anxious since the procedure and is having a difficult time relaxing.  She does report worsening pain in her right rotator cuff since the surgery, but reports that the nerve block that she was given for pain control started to wear off.  The patient was noted to be satting at 88% on room air and was placed on 2 L nasal cannula by nursing staff.  She is a non-smoker.  She reports a family history of VTE in her mother.  Cardiology: Dr. Ellyn Hack.  Last seen by cardiology in May 2019.  She had a cath in May 2012 showing normal coronaries and normal LVEF.  Last echo in April 2019 with EF of 60 to 65%. GR 1-2 DD.   The history is provided by the patient. No language interpreter was used.    Past Medical History:  Diagnosis  Date  . Anxiety   . Bulging lumbar disc   . Cardiac arrhythmia due to congenital heart disease    no arrhythmia identified   . Celiac disease   . Depression   . Diverticulosis    Sigmoid colon  . Fatty liver 05/2016  . Frequent headaches   . GERD (gastroesophageal reflux disease)   . Heart disease   . History of kidney stones   . Hypertension   . Kidney stones   . Migraine   . Obesity (BMI 35.0-39.9 without comorbidity)   . UTI (urinary tract infection)     Patient Active Problem List   Diagnosis Date Noted  . CAP (community acquired pneumonia) 12/30/2017  . Prolonged QT interval 12/30/2017  . Hyperlipidemia due to dietary fat intake 08/05/2017  . PVC's (premature ventricular contractions) 06/23/2017  . Shortness of breath 06/23/2017  . Hypothyroidism 09/17/2016  . Allergic rhinitis 06/06/2015  . Palpitations 04/11/2015  . Neck pain 06/29/2014  . Chronic back pain 06/29/2014  . Esophageal reflux 06/29/2014  . Obesity (BMI 30.0-34.9) 05/23/2013  . Normal coronary arteries- 2012 05/23/2013  . Suspected sleep apnea 05/23/2013  . Anxiety and depression 05/23/2013  . History of medication noncompliance 05/23/2013    Past Surgical History:  Procedure Laterality Date  . ABDOMINAL EXPLORATION SURGERY  1986  . COLONOSCOPY  06/2016  . EXTRACORPOREAL SHOCK WAVE LITHOTRIPSY Left 12/03/2016   Procedure: LEFT EXTRACORPOREAL SHOCK WAVE LITHOTRIPSY (ESWL);  Surgeon: Jeffie Pollock,  Jenny Reichmann, MD;  Location: [facility];  Service: Urology;  Laterality: Left;  . KIDNEY STONE SURGERY    . LEFT HEART CATH AND CORONARY ANGIOGRAPHY  07/28/2016    Large tortuous coronary arteries no radiographic evidence of disease.  Not anginal chest pain.  Normal EF.  [name] LITHOTRIPSY    . NASAL SINUS SURGERY    . ROTATOR CUFF REPAIR Right 12/27/2017  . TRANSTHORACIC ECHOCARDIOGRAM  06/2017   EF 60-65%.  GR 1-2 DD.  Otherwise normal echo.  Normal valve function.  Normal wall motion.  . TUBAL LIGATION       OB History     None      Home Medications    Prior to Admission medications   Medication Sig Start Date End Date Taking? Authorizing Provider  albuterol (PROVENTIL HFA;VENTOLIN HFA) 108 (90 Base) MCG/ACT inhaler Inhale 1-2 puffs into the lungs every 6 (six) hours as needed for wheezing or shortness of breath (cough). Patient taking differently: Inhale 1-2 puffs into the lungs every 6 (six) hours as needed (coughing, shortness of breath, or wheezing).  11/15/17  Yes [name], NP  aspirin 81 MG chewable tablet Chew 81 mg by mouth daily.   Yes [provider]  benzonatate (TESSALON) 200 MG capsule Take 1 capsule (200 mg total) by mouth 3 (three) times daily as needed. Patient taking differently: Take 200 mg by mouth 3 (three) times daily as needed for cough.  11/15/17  Yes [name], NP  Chlorpheniramine-PSE-Ibuprofen 2-30-200 MG TABS Take 1-2 tablets by mouth every 6 (six) hours as needed (for cold symptoms).   Yes [provider]  esomeprazole (NEXIUM) 40 MG capsule TAKE 1 CAPSULE BY MOUTH DAILY Patient taking differently: Take 40 mg by mouth daily before breakfast.  02/24/16  Yes [name], NP  FLUoxetine (PROZAC) 40 MG capsule Take 1 capsule (40 mg total) by mouth daily. 11/15/17  Yes [name], NP  levothyroxine (SYNTHROID, LEVOTHROID) 50 MCG tablet Take 1 tablet by mouth every morning on an empty stomach with water. No food or other medications for 30 min. Patient taking differently: Take 50 mcg by mouth daily before breakfast. No food or other medications for 30 min. 11/15/17  Yes [name], NP  LINZESS 72 MCG capsule TAKE 1 CAPSULE BY MOUTH ONCE DAILY BEFORE BREAKFAST Patient taking differently: Take 72 mcg by mouth daily as needed (for constipation).  12/20/17  Yes Nandigam, Venia Minks, MD  methocarbamol (ROBAXIN) 500 MG tablet Take 500 mg by mouth 3 (three) times daily.    Yes [provider]  metoprolol tartrate (LOPRESSOR) 25 MG  tablet Take 1 tablet (25 mg total) by mouth 2 (two) times daily. 06/23/17 12/29/17 Yes Leonie Man, MD  ondansetron (ZOFRAN ODT) 4 MG disintegrating tablet Take 1 tablet (4 mg total) by mouth every 8 (eight) hours as needed for nausea or vomiting. 12/03/16  Yes Irine Seal, MD  oxybutynin (DITROPAN-XL) 5 MG 24 hr tablet Take 5 mg by mouth daily. 12/03/17  Yes [provider]  oxyCODONE-acetaminophen (PERCOCET/ROXICET) 5-325 MG tablet Take 1 tablet by mouth See admin instructions. Take 1 tablet by mouth every 4-6 hours   Yes [provider]  ranitidine (ZANTAC) 150 MG tablet TAKE 1 TABLET BY MOUTH AT BEDTIME Patient taking differently: Take 150 mg by mouth at bedtime.  09/13/17  Yes Nandigam, Venia Minks, MD  HYDROcodone-acetaminophen (NORCO/VICODIN) 5-325 MG tablet Take 1-2 tablets by mouth every 6 (six) hours as needed  for severe pain. Patient not taking: Reported on 12/29/2017 12/03/16   Irine Seal, MD  ibuprofen (ADVIL,MOTRIN) 200 MG tablet Take 400 mg by mouth every 6 (six) hours as needed for pain or headache.    [provider]  meloxicam (MOBIC) 15 MG tablet Take 15 mg by mouth every morning. 06/04/16   [provider]  traMADol (ULTRAM) 50 MG tablet Take 50 mg by mouth every morning.  08/12/16   [provider]    Family History Family History  Problem Relation Age of Onset  . COPD Father   . Emphysema Father   . Gallstones Mother   . Colon cancer Neg Hx   . Stomach cancer Neg Hx   . Esophageal cancer Neg Hx     Social History Social History   Tobacco Use  . Smoking status: Former Smoker    Types: Cigarettes    Last attempt to quit: 2008    Years since quitting: 11.8  . Smokeless tobacco: Never Used  . Tobacco comment: used tobacco "socially"  Substance Use Topics  . Alcohol use: No    Alcohol/week: 0.0 standard drinks  . Drug use: No     Allergies   Penicillins and Latex   Review of Systems Review of Systems   Constitutional: Positive for chills, diaphoresis and fever. Negative for activity change.  HENT: Negative for congestion.   Respiratory: Positive for cough and shortness of breath.   Cardiovascular: Negative for chest pain and leg swelling.  Gastrointestinal: Positive for nausea. Negative for abdominal pain, constipation, diarrhea and vomiting.  Genitourinary: Negative for dysuria.  Musculoskeletal: Negative for back pain.  Skin: Negative for rash.  Allergic/Immunologic: Negative for immunocompromised state.  Neurological: Negative for dizziness, weakness, numbness and headaches.  Psychiatric/Behavioral: Negative for confusion.   Physical Exam Updated Vital Signs BP 133/82   Pulse 78   Temp 99.1 F (37.3 C) (Rectal)   Resp 17   LMP 08/12/2015 Comment: patient states hx. tubal ligation  SpO2 94%   Physical Exam  Constitutional: No distress. Nasal cannula in place.  Obese female.   HENT:  Head: Normocephalic.  Eyes: Conjunctivae are normal.  Neck: Normal range of motion. Neck supple.  Cardiovascular: Normal rate, regular rhythm, normal heart sounds and intact distal pulses. Exam reveals no gallop and no friction rub.  No murmur heard. Pulmonary/Chest: Effort normal and breath sounds normal. No stridor. No respiratory distress. She has no wheezes. She has no rales. She exhibits no tenderness.  Conversational dyspnea. The patient is taking a breath midway through sentences due to dyspnea.  Lungs are clear to auscultation bilaterally on initial exam with equal, symmetric expansion with inspiration and expiration.   Abdominal: Soft. Bowel sounds are normal. She exhibits no distension and no mass. There is no tenderness. There is no rebound and no guarding. No hernia.  Abdomen is obese, but soft, and nondistended.  Musculoskeletal:  No lower extremity edema.  Lymphadenopathy:    She has no cervical adenopathy.  Neurological: She is alert.  Skin: Skin is warm. No rash noted.   Psychiatric: Her behavior is normal.  Nursing note and vitals reviewed.  ED Treatments / Results  Labs (all labs ordered are listed, but only abnormal results are displayed) Labs Reviewed  CBC WITH DIFFERENTIAL/PLATELET - Abnormal; Notable for the following components:      Result Value   WBC 11.2 (*)    Hemoglobin 11.8 (*)    Abs Immature Granulocytes 0.13 (*)    All  other components within normal limits  COMPREHENSIVE METABOLIC PANEL - Abnormal; Notable for the following components:   Calcium 8.7 (*)    Albumin 3.3 (*)    All other components within normal limits  EXPECTORATED SPUTUM ASSESSMENT W REFEX TO RESP CULTURE  GRAM STAIN  TROPONIN I  BRAIN NATRIURETIC PEPTIDE  HIV ANTIBODY (ROUTINE TESTING W REFLEX)  STREP PNEUMONIAE URINARY ANTIGEN  BASIC METABOLIC PANEL  CBC WITH DIFFERENTIAL/PLATELET  LEGIONELLA PNEUMOPHILA SEROGP 1 UR AG  MAGNESIUM    EKG EKG Interpretation  Date/Time:  Wednesday December 29 2017 19:12:26 EDT Ventricular Rate:  77 PR Interval:    QRS Duration: 68 QT Interval:  538 QTC Calculation: 609 R Axis:   18 Text Interpretation:  Sinus rhythm Low voltage, precordial leads Nonspecific T abnrm, anterolateral leads Prolonged QT interval no significant change since Nov 2018 Confirmed by Sherwood Gambler [phone]) on 12/29/2017 8:10:12 PM   Radiology Dg Chest 1 View  Result Date: 12/29/2017 CLINICAL DATA:  Shortness of breath EXAM: CHEST  1 VIEW COMPARISON:  01/08/2017 FINDINGS: Low lung volumes with mild elevation of the right diaphragm. Subsegmental atelectasis at the right base. Borderline heart size. No pneumothorax. IMPRESSION: Elevation of right diaphragm with subsegmental atelectasis at the right base Electronically Signed   By: Donavan Foil M.D.   On: 12/29/2017 20:10   Ct Angio Chest Pe W And/or Wo Contrast  Result Date: 12/29/2017 CLINICAL DATA:  Shortness of breath, low O2 sats EXAM: CT ANGIOGRAPHY CHEST WITH CONTRAST TECHNIQUE:  Multidetector CT imaging of the chest was performed using the standard protocol during bolus administration of intravenous contrast. Multiplanar CT image reconstructions and MIPs were obtained to evaluate the vascular anatomy. CONTRAST:  118m ISOVUE-370 IOPAMIDOL (ISOVUE-370) INJECTION 76% COMPARISON:  Chest x-ray today. FINDINGS: Cardiovascular: No filling defects in the pulmonary arteries to suggest pulmonary emboli. Cardiomegaly. Aorta is normal caliber. Mediastinum/Nodes: No mediastinal, hilar, or axillary adenopathy. Lungs/Pleura: Ground-glass opacities in the lungs, likely early interstitial edema or atelectasis. Bibasilar opacities, right greater than left could reflect atelectasis. Is difficult to exclude pneumonia in the right lower lobe. Elevation of the right hemidiaphragm. No visible effusions. Upper Abdomen: Imaging into the upper abdomen shows no acute findings. Musculoskeletal: Chest wall soft tissues are unremarkable. No acute bony abnormality. Review of the MIP images confirms the above findings. IMPRESSION: No evidence of pulmonary embolus. Elevation of the right hemidiaphragm. Bibasilar airspace opacities, right greater than left. This likely reflects atelectasis on the left and could reflect atelectasis or infiltrate/pneumonia on the right. Cardiomegaly. Ground-glass opacities throughout the lungs could reflect early interstitial edema or atelectasis. Electronically Signed   By: KRolm BaptiseM.D.   On: 12/29/2017 23:05    Procedures Procedures (including critical care time)  Medications Ordered in ED Medications  iopamidol (ISOVUE-370) 76 % injection (has no administration in time range)  aspirin chewable tablet 81 mg (has no administration in time range)  oxyCODONE-acetaminophen (PERCOCET/ROXICET) 5-325 MG per tablet 1-2 tablet (has no administration in time range)  metoprolol tartrate (LOPRESSOR) tablet 25 mg (has no administration in time range)  levothyroxine (SYNTHROID,  LEVOTHROID) tablet 50 mcg (has no administration in time range)  oxybutynin (DITROPAN-XL) 24 hr tablet 5 mg (has no administration in time range)  methocarbamol (ROBAXIN) tablet 500 mg (has no administration in time range)  benzonatate (TESSALON) capsule 200 mg (has no administration in time range)  enoxaparin (LOVENOX) injection 40 mg (has no administration in time range)  sodium chloride flush (NS) 0.9 % injection 3 mL (  has no administration in time range)  sodium chloride flush (NS) 0.9 % injection 3 mL (has no administration in time range)  sodium chloride flush (NS) 0.9 % injection 3 mL (has no administration in time range)  0.9 %  sodium chloride infusion (has no administration in time range)  acetaminophen (TYLENOL) tablet 650 mg (has no administration in time range)    Or  acetaminophen (TYLENOL) suppository 650 mg (has no administration in time range)  senna-docusate (Senokot-S) tablet 1 tablet (has no administration in time range)  cefTRIAXone (ROCEPHIN) 1 g in sodium chloride 0.9 % 100 mL IVPB (has no administration in time range)  doxycycline (VIBRA-TABS) tablet 100 mg (has no administration in time range)  LORazepam (ATIVAN) tablet 0.5 mg (has no administration in time range)  potassium chloride SA (K-DUR,KLOR-CON) CR tablet 20 mEq (has no administration in time range)  magnesium sulfate IVPB 1 g 100 mL (has no administration in time range)  oxyCODONE-acetaminophen (PERCOCET/ROXICET) 5-325 MG per tablet 1 tablet (1 tablet Oral Given 12/29/17 2130)  iopamidol (ISOVUE-370) 76 % injection 100 mL (100 mLs Intravenous Contrast Given 12/29/17 2227)  cefTRIAXone (ROCEPHIN) 1 g in sodium chloride 0.9 % 100 mL IVPB (1 g Intravenous New Bag/Given 12/29/17 2352)  azithromycin (ZITHROMAX) 500 mg in sodium chloride 0.9 % 250 mL IVPB (500 mg Intravenous New Bag/Given 12/29/17 2353)  ondansetron (ZOFRAN) injection 4 mg (4 mg Intravenous Given 12/29/17 2354)  ipratropium-albuterol (DUONEB)  0.5-2.5 (3) MG/3ML nebulizer solution 3 mL (3 mLs Nebulization Given 12/30/17 0028)     Initial Impression / Assessment and Plan / ED Course  I have reviewed the triage vital signs and the nursing notes.  Pertinent labs & imaging results that were available during my care of the patient were reviewed by me and considered in my medical decision making (see chart for details).     52 year old female with a history of celiac disease, CAD, HTN, and nephrolithiasis presenting by EMS from home postop day 2 from a right rotator cuff surgery with dyspnea, subjective fever, chills, and chest pain.  On initial exam, patient appears very short of breath and has to stop several times in the middle of a sentence to catch her breath despite being on 2 L nasal cannula.  EKG unchanged from previous.  Troponin is negative.  Labs are notable for mild leukocytosis 11.2.  Chest x-ray with elevation of the right diaphragm with subsegmental atelectasis at the right base and borderline cardiomegaly which is new from November 2018 x-ray.  Suspect elevation of her diaphragm is secondary to nerve block from her right rotator cuff surgery.  Given recent surgery, PE started ordered to assess for pulmonary embolism, which was negative, but does demonstrate bibasilar airspace opacities, right greater than left, concerning for an infiltrate versus atelectasis of the right with cardiomegaly and groundglass opacities throughout the lungs concerning for early interstitial edema or atelectasis.   Rectal temp of 99.1.  No tachycardia. The patient was ambulated and noted to continue to be hypoxic at 89%.  Wheezing was noted with ambulation.  DuoNeb treatment given.  Given concern for CAP, Rocephin and azithromycin was initiated in the emergency department.  Given new hypoxia, consulted the hospitalist team for admission.  Dr. Myna Hidalgo will admit. The patient appears reasonably stabilized for admission considering the current resources,  flow, and capabilities available in the ED at this time, and I doubt any other [facility] requiring further screening and/or treatment in the ED prior to admission.  Final Clinical  Impressions(s) / ED Diagnoses   Final diagnoses:  Community acquired pneumonia of right lower lobe of lung [facility])  Hypoxia    ED Discharge Orders    None       Ahleah Simko A, PA-C 12/30/17 0113    Sherwood Gambler, MD 01/03/18 [phone]

## 2017-12-29 NOTE — Telephone Encounter (Signed)
received call from patient complaining of SOB.   She states she had shoulder surgery on R shoulder Monday and since then she has noticed increased SOB.  She feels like she cannot relax and take a deep breath, she states she thinks she needs something for anxiety to help her relax.  She denies swelling, CP, reports some minor dizziness but she contributes this to pain medication she is taking.  She reports intermittent sharp pain under her rib cage.    offered appointment tomorrow with APP but advised she would need to call PCP to discuss anxiety medication.   She request to not schedule appt here and to call her PCP to discuss anxiety.  She states if her PCP believes she needs to see cardiology she will call back.      As RN continued to talk with patient, SOB became more noticeable while patient was talking.   Patient stops to take a breath while speaking a sentence.  Advised I would recommend to proceed to ER to be evaluated.   Patient agreed and verbalized understanding.

## 2017-12-29 NOTE — Telephone Encounter (Signed)
I spoke with pt and her son will take pt when gets off work at 5:30; in the mean time pt will call 911 if needed to take her to ED. fYi to Gentry Fitz NP.

## 2017-12-29 NOTE — Telephone Encounter (Signed)
Given recent surgery, complaints of shortness of breath, she needs to be ruled out for PE. ED is appropriate.

## 2017-12-29 NOTE — ED Notes (Signed)
Pt going to Xray

## 2017-12-30 ENCOUNTER — Telehealth: Payer: Self-pay | Admitting: *Deleted

## 2017-12-30 ENCOUNTER — Encounter (HOSPITAL_COMMUNITY): Payer: Self-pay | Admitting: Family Medicine

## 2017-12-30 ENCOUNTER — Other Ambulatory Visit: Payer: Self-pay

## 2017-12-30 DIAGNOSIS — J181 Lobar pneumonia, unspecified organism: Secondary | ICD-10-CM | POA: Diagnosis not present

## 2017-12-30 DIAGNOSIS — J189 Pneumonia, unspecified organism: Secondary | ICD-10-CM | POA: Diagnosis present

## 2017-12-30 DIAGNOSIS — F419 Anxiety disorder, unspecified: Secondary | ICD-10-CM

## 2017-12-30 DIAGNOSIS — R9431 Abnormal electrocardiogram [ECG] [EKG]: Secondary | ICD-10-CM | POA: Diagnosis not present

## 2017-12-30 DIAGNOSIS — E039 Hypothyroidism, unspecified: Secondary | ICD-10-CM | POA: Diagnosis not present

## 2017-12-30 DIAGNOSIS — F329 Major depressive disorder, single episode, unspecified: Secondary | ICD-10-CM | POA: Diagnosis not present

## 2017-12-30 DIAGNOSIS — G8929 Other chronic pain: Secondary | ICD-10-CM | POA: Diagnosis not present

## 2017-12-30 LAB — BASIC METABOLIC PANEL
ANION GAP: 8 (ref 5–15)
BUN: 13 mg/dL (ref 6–20)
CHLORIDE: 107 mmol/L (ref 98–111)
CO2: 28 mmol/L (ref 22–32)
Calcium: 8.6 mg/dL — ABNORMAL LOW (ref 8.9–10.3)
Creatinine, Ser: 0.83 mg/dL (ref 0.44–1.00)
GFR calc non Af Amer: >60 mL/min (ref >60)
Glucose, Bld: 130 mg/dL — ABNORMAL HIGH (ref 70–99)
Potassium: 3.6 mmol/L (ref 3.5–5.1)
Sodium: 143 mmol/L (ref 135–145)

## 2017-12-30 LAB — CBC WITH DIFFERENTIAL/PLATELET
Abs Immature Granulocytes: 0.1 10*3/uL — ABNORMAL HIGH (ref 0.00–0.07)
Basophils Absolute: 0.1 10*3/uL (ref 0.0–0.1)
Basophils Relative: 1 %
EOS ABS: 0.5 10*3/uL (ref 0.0–0.5)
Eosinophils Relative: 5 %
HEMATOCRIT: 37.5 % (ref 36.0–46.0)
HEMOGLOBIN: 12 g/dL (ref 12.0–15.0)
IMMATURE GRANULOCYTES: 1 %
LYMPHS ABS: 2.3 10*3/uL (ref 0.7–4.0)
LYMPHS PCT: 20 %
MCH: 29.2 pg (ref 26.0–34.0)
MCHC: 32 g/dL (ref 30.0–36.0)
MCV: 91.2 fL (ref 80.0–100.0)
Monocytes Absolute: 0.8 10*3/uL (ref 0.1–1.0)
Monocytes Relative: 7 %
NRBC: 0 % (ref 0.0–0.2)
Neutro Abs: 7.9 10*3/uL — ABNORMAL HIGH (ref 1.7–7.7)
Neutrophils Relative %: 66 %
Platelets: 266 10*3/uL (ref 150–400)
RBC: 4.11 MIL/uL (ref 3.87–5.11)
RDW: 13.5 % (ref 11.5–15.5)
WBC: 11.6 10*3/uL — ABNORMAL HIGH (ref 4.0–10.5)

## 2017-12-30 LAB — TSH: TSH: 2.117 u[IU]/mL (ref 0.350–4.500)

## 2017-12-30 LAB — BRAIN NATRIURETIC PEPTIDE: B NATRIURETIC PEPTIDE 5: 49.6 pg/mL (ref 0.0–100.0)

## 2017-12-30 LAB — MAGNESIUM: MAGNESIUM: 1.8 mg/dL (ref 1.7–2.4)

## 2017-12-30 LAB — STREP PNEUMONIAE URINARY ANTIGEN: Strep Pneumo Urinary Antigen: NEGATIVE

## 2017-12-30 LAB — HIV ANTIBODY (ROUTINE TESTING W REFLEX): HIV Screen 4th Generation wRfx: NONREACTIVE

## 2017-12-30 MED ORDER — MAGNESIUM SULFATE 2 GM/50ML IV SOLN: 2.0000 g | Freq: Once | INTRAVENOUS | Status: DC

## 2017-12-30 MED ORDER — POTASSIUM CHLORIDE CRYS ER 20 MEQ PO TBCR
20.0000 meq | EXTENDED_RELEASE_TABLET | Freq: Once | ORAL | Status: AC
  Administered 2017-12-30: 20 meq via ORAL
  Filled 2017-12-30: qty 1

## 2017-12-30 MED ORDER — PROCHLORPERAZINE EDISYLATE 10 MG/2ML IJ SOLN
10.0000 mg | Freq: Once | INTRAMUSCULAR | Status: AC
  Administered 2017-12-30: 10 mg via INTRAVENOUS
  Filled 2017-12-30: qty 2

## 2017-12-30 MED ORDER — DOXYCYCLINE HYCLATE 100 MG PO TABS
100.0000 mg | ORAL_TABLET | Freq: Two times a day (BID) | ORAL | Status: DC
  Administered 2017-12-30 – 2017-12-31 (×4): 100 mg via ORAL
  Filled 2017-12-30 (×4): qty 1

## 2017-12-30 MED ORDER — SODIUM CHLORIDE 0.9% FLUSH
3.0000 mL | Freq: Two times a day (BID) | INTRAVENOUS | Status: DC
  Administered 2017-12-30 (×2): 3 mL via INTRAVENOUS

## 2017-12-30 MED ORDER — KETOROLAC TROMETHAMINE 15 MG/ML IJ SOLN
15.0000 mg | Freq: Once | INTRAMUSCULAR | Status: AC
  Administered 2017-12-30: 15 mg via INTRAVENOUS
  Filled 2017-12-30: qty 1

## 2017-12-30 MED ORDER — MAGNESIUM SULFATE IN D5W 1-5 GM/100ML-% IV SOLN
1.0000 g | Freq: Once | INTRAVENOUS | Status: AC
  Administered 2017-12-30: 1 g via INTRAVENOUS
  Filled 2017-12-30 (×3): qty 100

## 2017-12-30 MED ORDER — OXYCODONE-ACETAMINOPHEN 5-325 MG PO TABS
1.0000 | ORAL_TABLET | ORAL | Status: DC | PRN
  Administered 2017-12-30: 2 via ORAL
  Administered 2017-12-30: 1 via ORAL
  Administered 2017-12-30: 2 via ORAL
  Filled 2017-12-30: qty 2
  Filled 2017-12-30: qty 1
  Filled 2017-12-30: qty 2

## 2017-12-30 MED ORDER — IPRATROPIUM-ALBUTEROL 0.5-2.5 (3) MG/3ML IN SOLN
3.0000 mL | Freq: Once | RESPIRATORY_TRACT | Status: AC
  Administered 2017-12-30: 3 mL via RESPIRATORY_TRACT
  Filled 2017-12-30: qty 3

## 2017-12-30 MED ORDER — FAMOTIDINE 20 MG PO TABS
20.0000 mg | ORAL_TABLET | Freq: Every day | ORAL | Status: DC
  Administered 2017-12-30 – 2017-12-31 (×2): 20 mg via ORAL
  Filled 2017-12-30 (×2): qty 1

## 2017-12-30 MED ORDER — ENOXAPARIN SODIUM 40 MG/0.4ML ~~LOC~~ SOLN
40.0000 mg | SUBCUTANEOUS | Status: DC
  Administered 2017-12-30 – 2017-12-31 (×2): 40 mg via SUBCUTANEOUS
  Filled 2017-12-30 (×2): qty 0.4

## 2017-12-30 MED ORDER — METHOCARBAMOL 500 MG PO TABS
500.0000 mg | ORAL_TABLET | Freq: Three times a day (TID) | ORAL | Status: DC
  Administered 2017-12-30 – 2017-12-31 (×5): 500 mg via ORAL
  Filled 2017-12-30 (×5): qty 1

## 2017-12-30 MED ORDER — ACETAMINOPHEN 325 MG PO TABS: 650.0000 mg | ORAL_TABLET | Freq: Four times a day (QID) | ORAL | Status: DC | PRN

## 2017-12-30 MED ORDER — SENNOSIDES-DOCUSATE SODIUM 8.6-50 MG PO TABS: 1.0000 | ORAL_TABLET | Freq: Every evening | ORAL | Status: DC | PRN

## 2017-12-30 MED ORDER — SODIUM CHLORIDE 0.9 % IV SOLN: 250.0000 mL | INTRAVENOUS | Status: DC | PRN

## 2017-12-30 MED ORDER — OXYBUTYNIN CHLORIDE ER 5 MG PO TB24
5.0000 mg | ORAL_TABLET | Freq: Every day | ORAL | Status: DC
  Administered 2017-12-30 – 2017-12-31 (×2): 5 mg via ORAL
  Filled 2017-12-30 (×2): qty 1

## 2017-12-30 MED ORDER — LORAZEPAM 0.5 MG PO TABS: 0.5000 mg | ORAL_TABLET | Freq: Three times a day (TID) | ORAL | Status: DC | PRN

## 2017-12-30 MED ORDER — ACETAMINOPHEN 650 MG RE SUPP: 650.0000 mg | Freq: Four times a day (QID) | RECTAL | Status: DC | PRN

## 2017-12-30 MED ORDER — LEVOTHYROXINE SODIUM 50 MCG PO TABS
50.0000 ug | ORAL_TABLET | Freq: Every day | ORAL | Status: DC
  Administered 2017-12-30 – 2017-12-31 (×2): 50 ug via ORAL
  Filled 2017-12-30 (×2): qty 1

## 2017-12-30 MED ORDER — METOPROLOL TARTRATE 25 MG PO TABS
25.0000 mg | ORAL_TABLET | Freq: Two times a day (BID) | ORAL | Status: DC
  Administered 2017-12-30 – 2017-12-31 (×3): 25 mg via ORAL
  Filled 2017-12-30 (×3): qty 1

## 2017-12-30 MED ORDER — SODIUM CHLORIDE 0.9 % IV SOLN
1.0000 g | INTRAVENOUS | Status: DC
  Administered 2017-12-30: 1 g via INTRAVENOUS
  Filled 2017-12-30: qty 10

## 2017-12-30 MED ORDER — SODIUM CHLORIDE 0.9% FLUSH: 3.0000 mL | INTRAVENOUS | Status: DC | PRN

## 2017-12-30 MED ORDER — BENZONATATE 100 MG PO CAPS: 200.0000 mg | ORAL_CAPSULE | Freq: Three times a day (TID) | ORAL | Status: DC | PRN

## 2017-12-30 MED ORDER — SODIUM CHLORIDE 0.9 % IV SOLN
INTRAVENOUS | Status: DC
  Administered 2017-12-30 – 2017-12-31 (×2): via INTRAVENOUS

## 2017-12-30 MED ORDER — ASPIRIN 81 MG PO CHEW
81.0000 mg | CHEWABLE_TABLET | Freq: Every day | ORAL | Status: DC
  Administered 2017-12-30 – 2017-12-31 (×2): 81 mg via ORAL
  Filled 2017-12-30 (×2): qty 1

## 2017-12-30 NOTE — ED Notes (Signed)
Pt ambulated with a steady gait. o2 stats went down to 89% but pt did have some wheezing as well.

## 2017-12-30 NOTE — Progress Notes (Signed)
Patient admitted after midnight, please see H&P.  Still requiring O2 and with continued SOB and pleuritic pain.  Continue IV abx and wean O2 as tolerated.  Suspect will need 1-2 more days in the hospital.  Eulogio Bear DO

## 2017-12-30 NOTE — H&P (Signed)
History and Physical    Abigail [name] SWF:[id number] DOB: 11/19/1965 DOA: 12/29/2017  PCP: [name], NP   Patient coming from: Home   Chief Complaint: SOB, cough, right-sided pleuritic pain   HPI: Abigail [name] is a 52 y.o. female with medical history significant for anxiety disorder, chronic back pain, hypothyroidism, and is status post right rotator cuff repair on 12/27/2017, now presenting to the emergency department for evaluation of shortness of breath and cough.  Patient reports that following her surgery, she developed shortness of breath with cough and has had progressive symptoms since then.  She has ongoing dyspnea, worse with any exertion, cough is sometimes productive of thick sputum, and she has right-sided chest pain with deep inspiration or cough.  She denies any leg swelling or tenderness.  Reports some subjective fevers.    ED Course: Upon arrival to the ED, patient is found to be afebrile, saturating upper 80s on room air, mildly tachypneic, and with stable blood pressure.  EKG features a sinus rhythm with QTc interval of 609 ms.  CTA chest is negative for PE, but notable for likely atelectasis on the left and possible pneumonia at the right base.  Chemistry panel is unremarkable and CBC notable for mild leukocytosis.  Troponin is undetectable and BNP is normal.  Patient was treated with DuoNeb, Percocet, Rocephin, and azithromycin in the ED. She will be observed for treatment of CAP with mild hypoxia on room air.   Review of Systems:  All other systems reviewed and apart from HPI, are negative.  Past Medical History:  Diagnosis Date  . Anxiety   . Bulging lumbar disc   . Cardiac arrhythmia due to congenital heart disease    no arrhythmia identified   . Celiac disease   . Depression   . Diverticulosis    Sigmoid colon  . Fatty liver 05/2016  . Frequent headaches   . GERD (gastroesophageal reflux disease)   . Heart disease   . History of kidney  stones   . Hypertension   . Kidney stones   . Migraine   . Obesity (BMI 35.0-39.9 without comorbidity)   . UTI (urinary tract infection)     Past Surgical History:  Procedure Laterality Date  . ABDOMINAL EXPLORATION SURGERY  1986  . COLONOSCOPY  06/2016  . EXTRACORPOREAL SHOCK WAVE LITHOTRIPSY Left 12/03/2016   Procedure: LEFT EXTRACORPOREAL SHOCK WAVE LITHOTRIPSY (ESWL);  Surgeon: Irine Seal, MD;  Location: [facility];  Service: Urology;  Laterality: Left;  . KIDNEY STONE SURGERY    . LEFT HEART CATH AND CORONARY ANGIOGRAPHY  07/28/2016    Large tortuous coronary arteries no radiographic evidence of disease.  Not anginal chest pain.  Normal EF.  [name] LITHOTRIPSY    . NASAL SINUS SURGERY    . ROTATOR CUFF REPAIR Right 12/27/2017  . TRANSTHORACIC ECHOCARDIOGRAM  06/2017   EF 60-65%.  GR 1-2 DD.  Otherwise normal echo.  Normal valve function.  Normal wall motion.  . TUBAL LIGATION       reports that she quit smoking about 11 years ago. Her smoking use included cigarettes. She has never used smokeless tobacco. She reports that she does not drink alcohol or use drugs.  Allergies  Allergen Reactions  . Penicillins Nausea Only    Has patient had a PCN reaction causing immediate rash, facial/tongue/throat swelling, SOB or lightheadedness with hypotension: No Has patient had a PCN reaction causing severe rash involving mucus membranes or skin necrosis: No Has patient  had a PCN reaction that required hospitalization: No Has patient had a PCN reaction occurring within the last 10 years: No If all of the above answers are "NO", then may proceed with Cephalosporin use.   . Latex Rash    Family History  Problem Relation Age of Onset  . COPD Father   . Emphysema Father   . Gallstones Mother   . Colon cancer Neg Hx   . Stomach cancer Neg Hx   . Esophageal cancer Neg Hx      Prior to Admission medications   Medication Sig Start Date End Date Taking? Authorizing Provider  albuterol  (PROVENTIL HFA;VENTOLIN HFA) 108 (90 Base) MCG/ACT inhaler Inhale 1-2 puffs into the lungs every 6 (six) hours as needed for wheezing or shortness of breath (cough). Patient taking differently: Inhale 1-2 puffs into the lungs every 6 (six) hours as needed (coughing, shortness of breath, or wheezing).  11/15/17  Yes [name], NP  aspirin 81 MG chewable tablet Chew 81 mg by mouth daily.   Yes [provider]  benzonatate (TESSALON) 200 MG capsule Take 1 capsule (200 mg total) by mouth 3 (three) times daily as needed. Patient taking differently: Take 200 mg by mouth 3 (three) times daily as needed for cough.  11/15/17  Yes [name], NP  Chlorpheniramine-PSE-Ibuprofen 2-30-200 MG TABS Take 1-2 tablets by mouth every 6 (six) hours as needed (for cold symptoms).   Yes [provider]  esomeprazole (NEXIUM) 40 MG capsule TAKE 1 CAPSULE BY MOUTH DAILY Patient taking differently: Take 40 mg by mouth daily before breakfast.  02/24/16  Yes [name], NP  FLUoxetine (PROZAC) 40 MG capsule Take 1 capsule (40 mg total) by mouth daily. 11/15/17  Yes [name], NP  levothyroxine (SYNTHROID, LEVOTHROID) 50 MCG tablet Take 1 tablet by mouth every morning on an empty stomach with water. No food or other medications for 30 min. Patient taking differently: Take 50 mcg by mouth daily before breakfast. No food or other medications for 30 min. 11/15/17  Yes [name], NP  LINZESS 72 MCG capsule TAKE 1 CAPSULE BY MOUTH ONCE DAILY BEFORE BREAKFAST Patient taking differently: Take 72 mcg by mouth daily as needed (for constipation).  12/20/17  Yes Nandigam, Venia Minks, MD  methocarbamol (ROBAXIN) 500 MG tablet Take 500 mg by mouth 3 (three) times daily.    Yes [provider]  metoprolol tartrate (LOPRESSOR) 25 MG tablet Take 1 tablet (25 mg total) by mouth 2 (two) times daily. 06/23/17 12/29/17 Yes Leonie Man, MD  ondansetron (ZOFRAN ODT) 4 MG disintegrating  tablet Take 1 tablet (4 mg total) by mouth every 8 (eight) hours as needed for nausea or vomiting. 12/03/16  Yes Irine Seal, MD  oxybutynin (DITROPAN-XL) 5 MG 24 hr tablet Take 5 mg by mouth daily. 12/03/17  Yes [provider]  oxyCODONE-acetaminophen (PERCOCET/ROXICET) 5-325 MG tablet Take 1 tablet by mouth See admin instructions. Take 1 tablet by mouth every 4-6 hours   Yes [provider]  ranitidine (ZANTAC) 150 MG tablet TAKE 1 TABLET BY MOUTH AT BEDTIME Patient taking differently: Take 150 mg by mouth at bedtime.  09/13/17  Yes Nandigam, Venia Minks, MD  HYDROcodone-acetaminophen (NORCO/VICODIN) 5-325 MG tablet Take 1-2 tablets by mouth every 6 (six) hours as needed for severe pain. Patient not taking: Reported on 12/29/2017 12/03/16   Irine Seal, MD  ibuprofen (ADVIL,MOTRIN) 200 MG tablet Take 400 mg by mouth every 6 (six)  hours as needed for pain or headache.    [provider]  meloxicam (MOBIC) 15 MG tablet Take 15 mg by mouth every morning. 06/04/16   [provider]  traMADol (ULTRAM) 50 MG tablet Take 50 mg by mouth every morning.  08/12/16   [provider]    Physical Exam: Vitals:   12/29/17 2045 12/29/17 2345 12/30/17 0018 12/30/17 0028  BP: 131/66 133/82    Pulse: 74 78    Resp: 18 17    Temp:   99.1 F (37.3 C)   TempSrc:   Rectal   SpO2: 96% 94%  94%    Constitutional: NAD, anxious  Eyes: PERTLA, lids and conjunctivae normal ENMT: Mucous membranes are moist. Posterior pharynx clear of any exudate or lesions.   Neck: normal, supple, no masses, no thyromegaly Respiratory: Mild tachypnea, dyspnea with speech. Rhonchi on the right. No wheezes. No accessory muscle use.  Cardiovascular: S1 & S2 heard, regular rate and rhythm. No extremity edema.   Abdomen: No distension, no tenderness, soft. Bowel sounds normal.  Musculoskeletal: no clubbing / cyanosis. Right shoulder immobilized with sling, neurovascularly intact distally.    Skin:  no significant rashes, lesions, ulcers. Warm, dry, well-perfused. Neurologic: CN 2-12 grossly intact. Sensation intact. Strength 5/5 in all 4 limbs.  Psychiatric: Alert and oriented x 3. Anxious, cooperative.   Labs on Admission: I have personally reviewed following labs and imaging studies  CBC: Recent Labs  Lab 12/29/17 2114  WBC 11.2*  NEUTROABS 7.2  HGB 11.8*  HCT 38.8  MCV 92.2  PLT 793   Basic Metabolic Panel: Recent Labs  Lab 12/29/17 2114  NA 141  K 3.9  CL 107  CO2 27  GLUCOSE 98  BUN 15  CREATININE 0.74  CALCIUM 8.7*   GFR: CrCl cannot be calculated (Unknown ideal weight.). Liver Function Tests: Recent Labs  Lab 12/29/17 2114  AST 26  ALT 31  ALKPHOS 76  BILITOT 0.5  PROT 6.5  ALBUMIN 3.3*   No results for input(s): LIPASE, AMYLASE in the last 168 hours. No results for input(s): AMMONIA in the last 168 hours. Coagulation Profile: No results for input(s): INR, PROTIME in the last 168 hours. Cardiac Enzymes: Recent Labs  Lab 12/29/17 2114  TROPONINI <0.03   BNP (last 3 results) No results for input(s): PROBNP in the last 8760 hours. HbA1C: No results for input(s): HGBA1C in the last 72 hours. CBG: No results for input(s): GLUCAP in the last 168 hours. Lipid Profile: No results for input(s): CHOL, HDL, LDLCALC, TRIG, CHOLHDL, LDLDIRECT in the last 72 hours. Thyroid Function Tests: No results for input(s): TSH, T4TOTAL, FREET4, T3FREE, THYROIDAB in the last 72 hours. Anemia Panel: No results for input(s): VITAMINB12, FOLATE, FERRITIN, TIBC, IRON, RETICCTPCT in the last 72 hours. Urine analysis:    Component Value Date/Time   COLORURINE YELLOW 10/12/2016 1703   APPEARANCEUR CLOUDY (A) 10/12/2016 1703   LABSPEC 1.030 10/12/2016 1703   PHURINE 5.0 10/12/2016 1703   GLUCOSEU NEGATIVE 10/12/2016 1703   HGBUR LARGE (A) 10/12/2016 1703   BILIRUBINUR NEGATIVE 10/12/2016 1703   BILIRUBINUR 1 mg/dL 09/04/2016 0959   KETONESUR NEGATIVE 10/12/2016  1703   PROTEINUR 100 (A) 10/12/2016 1703   UROBILINOGEN 1.0 09/04/2016 0959   UROBILINOGEN 0.2 04/08/2014 1759   NITRITE NEGATIVE 10/12/2016 1703   LEUKOCYTESUR NEGATIVE 10/12/2016 1703   Sepsis Labs: @LABRCNTIP (procalcitonin:4,lacticidven:4) )No results found for this or any previous visit (from the past 240 hour(s)).   Radiological Exams on  Admission: Dg Chest 1 View  Result Date: 12/29/2017 CLINICAL DATA:  Shortness of breath EXAM: CHEST  1 VIEW COMPARISON:  01/08/2017 FINDINGS: Low lung volumes with mild elevation of the right diaphragm. Subsegmental atelectasis at the right base. Borderline heart size. No pneumothorax. IMPRESSION: Elevation of right diaphragm with subsegmental atelectasis at the right base Electronically Signed   By: Donavan Foil M.D.   On: 12/29/2017 20:10   Ct Angio Chest Pe W And/or Wo Contrast  Result Date: 12/29/2017 CLINICAL DATA:  Shortness of breath, low O2 sats EXAM: CT ANGIOGRAPHY CHEST WITH CONTRAST TECHNIQUE: Multidetector CT imaging of the chest was performed using the standard protocol during bolus administration of intravenous contrast. Multiplanar CT image reconstructions and MIPs were obtained to evaluate the vascular anatomy. CONTRAST:  152m ISOVUE-370 IOPAMIDOL (ISOVUE-370) INJECTION 76% COMPARISON:  Chest x-ray today. FINDINGS: Cardiovascular: No filling defects in the pulmonary arteries to suggest pulmonary emboli. Cardiomegaly. Aorta is normal caliber. Mediastinum/Nodes: No mediastinal, hilar, or axillary adenopathy. Lungs/Pleura: Ground-glass opacities in the lungs, likely early interstitial edema or atelectasis. Bibasilar opacities, right greater than left could reflect atelectasis. Is difficult to exclude pneumonia in the right lower lobe. Elevation of the right hemidiaphragm. No visible effusions. Upper Abdomen: Imaging into the upper abdomen shows no acute findings. Musculoskeletal: Chest wall soft tissues are unremarkable. No acute bony  abnormality. Review of the MIP images confirms the above findings. IMPRESSION: No evidence of pulmonary embolus. Elevation of the right hemidiaphragm. Bibasilar airspace opacities, right greater than left. This likely reflects atelectasis on the left and could reflect atelectasis or infiltrate/pneumonia on the right. Cardiomegaly. Ground-glass opacities throughout the lungs could reflect early interstitial edema or atelectasis. Electronically Signed   By: KRolm BaptiseM.D.   On: 12/29/2017 23:05    EKG: Independently reviewed. Sinus rhythm, QTc 609 ms.   Assessment/Plan  1. CAP  - Presents with 3 days of progressive SOB and cough with right-sided pleuritic pain  - She is found to have tachypnea, hypoxia with ambulation, mild leukocytosis, and rhonchi on right  - She had recent surgery and PE was ruled-out with CTA that was concerning for right-sided PNA in addition to atelectasis  - She was treated with Rocephin and azithromycin in ED  - Check sputum culture, check strep pneumo antigen, continue empiric antibiotics with Rocephin and doxycycline    2. Prolonged QT interval  - QTc prolonged to 609 ms on admission EKG  - Replace potassium to 4 and mag to 2, check TSH, avoid offending medications, continue cardiac monitoring, and repeat EKG in am   3. Anxiety  - Prozac held in light of prolonged QT interval  - Use a low-dose Ativan as-needed for now    4. Hypothyroidism  - Check TSH in light of long QT   - Continue Synthroid    DVT prophylaxis: Lovenox   Code Status: Full  Family Communication: Discussed with patient  Consults called: None Admission status: Observation     TVianne Bulls MD [facility] Pager [phone] If 7PM-7AM, please contact night-coverage [email or web address] Password [email or web address]  12/30/2017, 1:13 AM

## 2017-12-30 NOTE — Telephone Encounter (Signed)
Linzess approved until 12/23/2018 sent approval to be scanned in

## 2017-12-31 ENCOUNTER — Observation Stay (HOSPITAL_COMMUNITY): Payer: 59

## 2017-12-31 DIAGNOSIS — R109 Unspecified abdominal pain: Secondary | ICD-10-CM | POA: Diagnosis not present

## 2017-12-31 DIAGNOSIS — F419 Anxiety disorder, unspecified: Secondary | ICD-10-CM | POA: Diagnosis not present

## 2017-12-31 DIAGNOSIS — F329 Major depressive disorder, single episode, unspecified: Secondary | ICD-10-CM | POA: Diagnosis not present

## 2017-12-31 DIAGNOSIS — J181 Lobar pneumonia, unspecified organism: Secondary | ICD-10-CM | POA: Diagnosis not present

## 2017-12-31 DIAGNOSIS — E039 Hypothyroidism, unspecified: Secondary | ICD-10-CM | POA: Diagnosis not present

## 2017-12-31 LAB — CBC
HEMATOCRIT: 38.2 % (ref 36.0–46.0)
HEMOGLOBIN: 11.9 g/dL — AB (ref 12.0–15.0)
MCH: 28.4 pg (ref 26.0–34.0)
MCHC: 31.2 g/dL (ref 30.0–36.0)
MCV: 91.2 fL (ref 80.0–100.0)
Platelets: 276 10*3/uL (ref 150–400)
RBC: 4.19 MIL/uL (ref 3.87–5.11)
RDW: 13.3 % (ref 11.5–15.5)
WBC: 8.9 10*3/uL (ref 4.0–10.5)
nRBC: 0 % (ref 0.0–0.2)

## 2017-12-31 LAB — BASIC METABOLIC PANEL
Anion gap: 7 (ref 5–15)
BUN: 14 mg/dL (ref 6–20)
CALCIUM: 8.7 mg/dL — AB (ref 8.9–10.3)
CHLORIDE: 107 mmol/L (ref 98–111)
CO2: 29 mmol/L (ref 22–32)
CREATININE: 0.65 mg/dL (ref 0.44–1.00)
GFR calc non Af Amer: >60 mL/min (ref >60)
GLUCOSE: 92 mg/dL (ref 70–99)
Potassium: 4 mmol/L (ref 3.5–5.1)
SODIUM: 143 mmol/L (ref 135–145)

## 2017-12-31 LAB — LEGIONELLA PNEUMOPHILA SEROGP 1 UR AG: L. pneumophila Serogp 1 Ur Ag: NEGATIVE

## 2017-12-31 MED ORDER — DOXYCYCLINE HYCLATE 100 MG PO TABS: 100.0000 mg | ORAL_TABLET | Freq: Two times a day (BID) | ORAL | 0 refills | Status: DC

## 2017-12-31 NOTE — Progress Notes (Signed)
RN educated Pt on D/C instructions. RN educated w/ teachback on Rx Doxycycline. Pt to D/C w/ family arrival

## 2017-12-31 NOTE — Discharge Summary (Signed)
Physician Discharge Summary  Abigail [name] HER:[id number] DOB: 1965-04-21 DOA: 12/29/2017  PCP: [name], NP  Admit date: 12/29/2017 Discharge date: 12/31/2017  Admitted From: home Discharge disposition: home   Recommendations for Outpatient Follow-Up:   1. Periodic check of Qtc 2. Ensure PNA resolution   Discharge Diagnosis:   Principal Problem:   CAP (community acquired pneumonia) Active Problems:   Anxiety and depression   Chronic back pain   Hypothyroidism   Prolonged QT interval    Discharge Condition: Improved.  Diet recommendation: Low sodium, heart healthy  Wound care: None.  Code status: Full.   History of Present Illness:   Abigail [name] is a 52 y.o. female with medical history significant for anxiety disorder, chronic back pain, hypothyroidism, and is status post right rotator cuff repair on 12/27/2017, now presenting to the emergency department for evaluation of shortness of breath and cough.  Patient reports that following her surgery, she developed shortness of breath with cough and has had progressive symptoms since then.  She has ongoing dyspnea, worse with any exertion, cough is sometimes productive of thick sputum, and she has right-sided chest pain with deep inspiration or cough.  She denies any leg swelling or tenderness.  Reports some subjective fevers.     Hospital Course by Problem:   CAP with pleuritic pain - Presents with 3 days of progressive SOB and cough with right-sided pleuritic pain  - She is found to have tachypnea, hypoxia with ambulation, mild leukocytosis, and rhonchi on right  - She had recent surgery and PE was ruled-out with CTA that was concerning for right-sided PNA in addition to atelectasis  - She was treated with Rocephin and azithromycin in ED-- plan to finish course with doxycycline -RUQ to r/o GB etiology as patient was concerned about gall bladder  Prolonged QT interval   -resolved -follow Mg/K to ensure stability   Anxiety  - resume prozac -monitor Qtc as an outpatient  Hypothyroidism  - Continue Synthroid     Medical Consultants:      Discharge Exam:   Vitals:   12/31/17 0526 12/31/17 0900  BP: 130/77 114/69  Pulse: 80 79  Resp: 18 16  Temp: 98.2 F (36.8 C) 98.2 F (36.8 C)  SpO2: 97% 95%   Vitals:   12/30/17 1816 12/30/17 2328 12/31/17 0526 12/31/17 0900  BP: 129/70 104/67 130/77 114/69  Pulse: 70 74 80 79  Resp: 16 16 18 16   Temp:  98.4 F (36.9 C) 98.2 F (36.8 C) 98.2 F (36.8 C)  TempSrc:  Oral Oral Oral  SpO2: 94% 92% 97% 95%  Weight:      Height:        General exam: Appears calm and comfortable.    The results of significant diagnostics from this hospitalization (including imaging, microbiology, ancillary and laboratory) are listed below for reference.     Procedures and Diagnostic Studies:   Dg Chest 1 View  Result Date: 12/29/2017 CLINICAL DATA:  Shortness of breath EXAM: CHEST  1 VIEW COMPARISON:  01/08/2017 FINDINGS: Low lung volumes with mild elevation of the right diaphragm. Subsegmental atelectasis at the right base. Borderline heart size. No pneumothorax. IMPRESSION: Elevation of right diaphragm with subsegmental atelectasis at the right base Electronically Signed   By: Donavan Foil M.D.   On: 12/29/2017 20:10   Ct Angio Chest Pe W And/or Wo Contrast  Result Date: 12/29/2017 CLINICAL DATA:  Shortness of breath, low O2 sats EXAM: CT  ANGIOGRAPHY CHEST WITH CONTRAST TECHNIQUE: Multidetector CT imaging of the chest was performed using the standard protocol during bolus administration of intravenous contrast. Multiplanar CT image reconstructions and MIPs were obtained to evaluate the vascular anatomy. CONTRAST:  146m ISOVUE-370 IOPAMIDOL (ISOVUE-370) INJECTION 76% COMPARISON:  Chest x-ray today. FINDINGS: Cardiovascular: No filling defects in the pulmonary arteries to suggest pulmonary emboli.  Cardiomegaly. Aorta is normal caliber. Mediastinum/Nodes: No mediastinal, hilar, or axillary adenopathy. Lungs/Pleura: Ground-glass opacities in the lungs, likely early interstitial edema or atelectasis. Bibasilar opacities, right greater than left could reflect atelectasis. Is difficult to exclude pneumonia in the right lower lobe. Elevation of the right hemidiaphragm. No visible effusions. Upper Abdomen: Imaging into the upper abdomen shows no acute findings. Musculoskeletal: Chest wall soft tissues are unremarkable. No acute bony abnormality. Review of the MIP images confirms the above findings. IMPRESSION: No evidence of pulmonary embolus. Elevation of the right hemidiaphragm. Bibasilar airspace opacities, right greater than left. This likely reflects atelectasis on the left and could reflect atelectasis or infiltrate/pneumonia on the right. Cardiomegaly. Ground-glass opacities throughout the lungs could reflect early interstitial edema or atelectasis. Electronically Signed   By: KRolm BaptiseM.D.   On: 12/29/2017 23:05     Labs:   Basic Metabolic Panel: Recent Labs  Lab 12/29/17 2114 12/30/17 0435 12/31/17 0637  NA 141 143 143  K 3.9 3.6 4.0  CL 107 107 107  CO2 27 28 29   GLUCOSE 98 130* 92  BUN 15 13 14   CREATININE 0.74 0.83 0.65  CALCIUM 8.7* 8.6* 8.7*  MG  --  1.8  --    GFR Estimated Creatinine Clearance: 85.3 mL/min (by C-G formula based on SCr of 0.65 mg/dL). Liver Function Tests: Recent Labs  Lab 12/29/17 2114  AST 26  ALT 31  ALKPHOS 76  BILITOT 0.5  PROT 6.5  ALBUMIN 3.3*   No results for input(s): LIPASE, AMYLASE in the last 168 hours. No results for input(s): AMMONIA in the last 168 hours. Coagulation profile No results for input(s): INR, PROTIME in the last 168 hours.  CBC: Recent Labs  Lab 12/29/17 2114 12/30/17 0435 12/31/17 0637  WBC 11.2* 11.6* 8.9  NEUTROABS 7.2 7.9*  --   HGB 11.8* 12.0 11.9*  HCT 38.8 37.5 38.2  MCV 92.2 91.2 91.2  PLT 270  266 276   Cardiac Enzymes: Recent Labs  Lab 12/29/17 2114  TROPONINI <0.03   BNP: Invalid input(s): POCBNP CBG: No results for input(s): GLUCAP in the last 168 hours. D-Dimer No results for input(s): DDIMER in the last 72 hours. Hgb A1c No results for input(s): HGBA1C in the last 72 hours. Lipid Profile No results for input(s): CHOL, HDL, LDLCALC, TRIG, CHOLHDL, LDLDIRECT in the last 72 hours. Thyroid function studies Recent Labs    12/30/17 0435  TSH 2.117   Anemia work up No results for input(s): VITAMINB12, FOLATE, FERRITIN, TIBC, IRON, RETICCTPCT in the last 72 hours. Microbiology No results found for this or any previous visit (from the past 240 hour(s)).   Discharge Instructions:   Discharge Instructions    Diet - low sodium heart healthy   Complete by:  As directed    Increase activity slowly   Complete by:  As directed      Allergies as of 12/31/2017      Reactions   Penicillins Nausea Only   Has patient had a PCN reaction causing immediate rash, facial/tongue/throat swelling, SOB or lightheadedness with hypotension: No Has patient had a PCN reaction  causing severe rash involving mucus membranes or skin necrosis: No Has patient had a PCN reaction that required hospitalization: No Has patient had a PCN reaction occurring within the last 10 years: No If all of the above answers are "NO", then may proceed with Cephalosporin use.   Latex Rash      Medication List    STOP taking these medications   HYDROcodone-acetaminophen 5-325 MG tablet Commonly known as:  NORCO/VICODIN     TAKE these medications   albuterol 108 (90 Base) MCG/ACT inhaler Commonly known as:  PROVENTIL HFA;VENTOLIN HFA Inhale 1-2 puffs into the lungs every 6 (six) hours as needed for wheezing or shortness of breath (cough). What changed:  reasons to take this   aspirin 81 MG chewable tablet Chew 81 mg by mouth daily.   benzonatate 200 MG capsule Commonly known as:  TESSALON Take  1 capsule (200 mg total) by mouth 3 (three) times daily as needed. What changed:  reasons to take this   Chlorpheniramine-PSE-Ibuprofen 2-30-200 MG Tabs Take 1-2 tablets by mouth every 6 (six) hours as needed (for cold symptoms).   doxycycline 100 MG tablet Commonly known as:  VIBRA-TABS Take 1 tablet (100 mg total) by mouth every 12 (twelve) hours.   esomeprazole 40 MG capsule Commonly known as:  NEXIUM TAKE 1 CAPSULE BY MOUTH DAILY What changed:  when to take this   FLUoxetine 40 MG capsule Commonly known as:  PROZAC Take 1 capsule (40 mg total) by mouth daily.   ibuprofen 200 MG tablet Commonly known as:  ADVIL,MOTRIN Take 400 mg by mouth every 6 (six) hours as needed for pain or headache.   levothyroxine 50 MCG tablet Commonly known as:  SYNTHROID, LEVOTHROID Take 1 tablet by mouth every morning on an empty stomach with water. No food or other medications for 30 min. What changed:    how much to take  how to take this  when to take this  additional instructions   LINZESS 72 MCG capsule Generic drug:  linaclotide TAKE 1 CAPSULE BY MOUTH ONCE DAILY BEFORE BREAKFAST What changed:  See the new instructions.   meloxicam 15 MG tablet Commonly known as:  MOBIC Take 15 mg by mouth every morning.   methocarbamol 500 MG tablet Commonly known as:  ROBAXIN Take 500 mg by mouth 3 (three) times daily.   metoprolol tartrate 25 MG tablet Commonly known as:  LOPRESSOR Take 1 tablet (25 mg total) by mouth 2 (two) times daily.   ondansetron 4 MG disintegrating tablet Commonly known as:  ZOFRAN-ODT Take 1 tablet (4 mg total) by mouth every 8 (eight) hours as needed for nausea or vomiting.   oxybutynin 5 MG 24 hr tablet Commonly known as:  DITROPAN-XL Take 5 mg by mouth daily.   oxyCODONE-acetaminophen 5-325 MG tablet Commonly known as:  PERCOCET/ROXICET Take 1 tablet by mouth See admin instructions. Take 1 tablet by mouth every 4-6 hours   ranitidine 150 MG  tablet Commonly known as:  ZANTAC TAKE 1 TABLET BY MOUTH AT BEDTIME   traMADol 50 MG tablet Commonly known as:  ULTRAM Take 50 mg by mouth every morning.      Follow-up Information    [name], NP Follow up in 1 week(s).   Specialty:  Internal Medicine Contact information: [place] [address] [phone]        Leonie Man, MD .   Specialty:  Cardiology Contact information: [address] [place]  [place] [address] [phone]            Time coordinating discharge: 25 min  Signed:  Geradine Girt  [facility] 12/31/2017, 2:00 PM

## 2017-12-31 NOTE — Progress Notes (Signed)
Per Dr Eliseo Squires may go without sputum assessment

## 2017-12-31 NOTE — Progress Notes (Signed)
Plan to d/c after RUQ U/S if normal.

## 2018-01-03 ENCOUNTER — Telehealth: Payer: Self-pay

## 2018-01-03 NOTE — Telephone Encounter (Signed)
Transition Care Management Follow-up Telephone Call   Date discharged? 12/31/2017   How have you been since you were released from the hospital? Still hurts under her rib cage, coughing. A little bit of improvement. Breathing a little better.   Do you understand why you were in the hospital? Yes    Do you understand the discharge instructions? Yes   Where were you discharged to? Home   Items Reviewed:  Medications reviewed: Yes-patient states at the hospital they had patient that she was diabetic and on diabetic medications but she is not. Patient will discuss Alma Friendly, NP  Allergies reviewed: Yes  Dietary changes reviewed: Yes-did not have any.  Referrals reviewed: Yes-patient was advised to follow up with cardiologist and general surgeon.   Functional Questionnaire:   Activities of Daily Living (ADLs):   She states they are independent in the following: ambulation, toileting, continence, feeding herself.  States they require assistance with the following: bathing, dressing, grooming.   Any transportation issues/concerns?: No-has someone to help get her places.   Any patient concerns? Not at this time.   Confirmed importance and date/time of follow-up visits scheduled YES  Provider Appointment booked with Alma Friendly, NP on 01/07/2018 at 9:20 am.  Confirmed with patient if condition begins to worsen call PCP or go to the ER.  Patient was given the office number and encouraged to call back with question or concerns.  Yes-patient understands.

## 2018-01-07 ENCOUNTER — Ambulatory Visit: Payer: 59 | Admitting: Primary Care

## 2018-01-07 ENCOUNTER — Encounter: Payer: Self-pay | Admitting: Primary Care

## 2018-01-07 VITALS — BP 130/80 | HR 67 | Temp 98.3°F | Ht 62.0 in | Wt 198.5 lb

## 2018-01-07 DIAGNOSIS — M546 Pain in thoracic spine: Secondary | ICD-10-CM | POA: Diagnosis not present

## 2018-01-07 DIAGNOSIS — J189 Pneumonia, unspecified organism: Secondary | ICD-10-CM | POA: Diagnosis not present

## 2018-01-07 DIAGNOSIS — Z09 Encounter for follow-up examination after completed treatment for conditions other than malignant neoplasm: Secondary | ICD-10-CM

## 2018-01-07 NOTE — Progress Notes (Signed)
Subjective:    Patient ID: Abigail [name], female    DOB: 07-15-1965, 52 y.o.   MRN: [id number]  HPI  [name] is a 52 year old female who presents today for [facility] Follow up.  She presented to [facility] on 12/29/17 with a chief complaint of dyspnea over the last 3 days that increased significantly on arrival. Also with hot flashes, tightiness under her right rib cage. She had a right rotator cuff repair two days prior, was using incentive spirometer as directed.   During her stay in the ED she was noted to have an oxygen saturation of 88% on room air, she was placed on 2 L of oxygen per nasal canula. She was noted to have tachypnea. ECG unchanged. Troponin level negative. Chest xray with elevation of right diaphragm with atelectasis at right base, borderline cardiomegaly. She was admitted for further work up including rule out for PE and pneumonia. She was initiated on IV Rocephin and Azithromycin for empiric treatment.   During her hospital stay PE was ruled out with negative CTA chest. Rather, her CT chest did suggest pneumonia. She did also report RUQ abdominal pain, ultrasound showed hepatic steatosis, other wise unremarkable. She was discharged home on 12/31/17 with a prescription for Doxycycline antibiotics.   Since her discharge home she's completed her Doxycycline antibiotic. She continues to experience discomfort under her right rib cage with radiation across her upper abdomen and left mid back. Her symptoms are improved with use of her albuterol inhaler. She denies fevers. She's using her incentive spirometer infrequently. She is managed on methocarbamol daily.   Review of Systems  Constitutional: Negative for fever.  Respiratory: Positive for chest tightness. Negative for shortness of breath.   Cardiovascular: Negative for chest pain.  Musculoskeletal: Positive for myalgias.       Past Medical History:  Diagnosis Date  . Anxiety   . Bulging lumbar disc   . Cardiac  arrhythmia due to congenital heart disease    no arrhythmia identified   . Celiac disease   . Depression   . Diverticulosis    Sigmoid colon  . Fatty liver 05/2016  . Frequent headaches   . GERD (gastroesophageal reflux disease)   . Heart disease   . History of kidney stones   . Hypertension   . Kidney stones   . Migraine   . Obesity (BMI 35.0-39.9 without comorbidity)   . UTI (urinary tract infection)      Social History   Socioeconomic History  . Marital status: Single    Spouse name: Not on file  . Number of children: Not on file  . Years of education: Not on file  . Highest education level: Not on file  Occupational History  . Not on file  Social Needs  . Financial resource strain: Not on file  . Food insecurity:    Worry: Not on file    Inability: Not on file  . Transportation needs:    Medical: Not on file    Non-medical: Not on file  Tobacco Use  . Smoking status: Former Smoker    Types: Cigarettes    Last attempt to quit: 2008    Years since quitting: 11.8  . Smokeless tobacco: Never Used  . Tobacco comment: used tobacco "socially"  Substance and Sexual Activity  . Alcohol use: No    Alcohol/week: 0.0 standard drinks  . Drug use: No  . Sexual activity: Not on file  Lifestyle  . Physical activity:  Days per week: Not on file    Minutes per session: Not on file  . Stress: Not on file  Relationships  . Social connections:    Talks on phone: Not on file    Gets together: Not on file    Attends religious service: Not on file    Active member of club or organization: Not on file    Attends meetings of clubs or organizations: Not on file    Relationship status: Not on file  . Intimate partner violence:    Fear of current or ex partner: Not on file    Emotionally abused: Not on file    Physically abused: Not on file    Forced sexual activity: Not on file  Other Topics Concern  . Not on file  Social History Narrative   Single.   Has a set of  Twins, age 22.   Works for the [employer] and [employer] and [employer]   Enjoys relaxing, spending time with her children.    Past Surgical History:  Procedure Laterality Date  . ABDOMINAL EXPLORATION SURGERY  1986  . COLONOSCOPY  06/2016  . EXTRACORPOREAL SHOCK WAVE LITHOTRIPSY Left 12/03/2016   Procedure: LEFT EXTRACORPOREAL SHOCK WAVE LITHOTRIPSY (ESWL);  Surgeon: Irine Seal, MD;  Location: [facility];  Service: Urology;  Laterality: Left;  . KIDNEY STONE SURGERY    . LEFT HEART CATH AND CORONARY ANGIOGRAPHY  07/28/2016    Large tortuous coronary arteries no radiographic evidence of disease.  Not anginal chest pain.  Normal EF.  [name] LITHOTRIPSY    . NASAL SINUS SURGERY    . ROTATOR CUFF REPAIR Right 12/27/2017  . TRANSTHORACIC ECHOCARDIOGRAM  06/2017   EF 60-65%.  GR 1-2 DD.  Otherwise normal echo.  Normal valve function.  Normal wall motion.  . TUBAL LIGATION      Family History  Problem Relation Age of Onset  . COPD Father   . Emphysema Father   . Gallstones Mother   . Colon cancer Neg Hx   . Stomach cancer Neg Hx   . Esophageal cancer Neg Hx     Allergies  Allergen Reactions  . Penicillins Nausea Only    Has patient had a PCN reaction causing immediate rash, facial/tongue/throat swelling, SOB or lightheadedness with hypotension: No Has patient had a PCN reaction causing severe rash involving mucus membranes or skin necrosis: No Has patient had a PCN reaction that required hospitalization: No Has patient had a PCN reaction occurring within the last 10 years: No If all of the above answers are "NO", then may proceed with Cephalosporin use.   . Latex Rash    Current Outpatient Medications on File Prior to Visit  Medication Sig Dispense Refill  . albuterol (PROVENTIL HFA;VENTOLIN HFA) 108 (90 Base) MCG/ACT inhaler Inhale 1-2 puffs into the lungs every 6 (six) hours as needed for wheezing or shortness of breath (cough). (Patient taking differently: Inhale 1-2 puffs into  the lungs every 6 (six) hours as needed (coughing, shortness of breath, or wheezing). ) 1 Inhaler 0  . aspirin 81 MG chewable tablet Chew 81 mg by mouth daily.    . benzonatate (TESSALON) 200 MG capsule Take 1 capsule (200 mg total) by mouth 3 (three) times daily as needed. (Patient taking differently: Take 200 mg by mouth 3 (three) times daily as needed for cough. ) 30 capsule 0  . Chlorpheniramine-PSE-Ibuprofen 2-30-200 MG TABS Take 1-2 tablets by mouth every 6 (six) hours as needed (for  cold symptoms).    [name] esomeprazole (NEXIUM) 40 MG capsule TAKE 1 CAPSULE BY MOUTH DAILY (Patient taking differently: Take 40 mg by mouth daily before breakfast. ) 90 capsule 1  . FLUoxetine (PROZAC) 40 MG capsule Take 1 capsule (40 mg total) by mouth daily. 90 capsule 3  . ibuprofen (ADVIL,MOTRIN) 200 MG tablet Take 400 mg by mouth every 6 (six) hours as needed for pain or headache.    . levothyroxine (SYNTHROID, LEVOTHROID) 50 MCG tablet Take 1 tablet by mouth every morning on an empty stomach with water. No food or other medications for 30 min. (Patient taking differently: Take 50 mcg by mouth daily before breakfast. No food or other medications for 30 min.) 90 tablet 3  . LINZESS 72 MCG capsule TAKE 1 CAPSULE BY MOUTH ONCE DAILY BEFORE BREAKFAST (Patient taking differently: Take 72 mcg by mouth daily as needed (for constipation). ) 30 capsule 11  . meloxicam (MOBIC) 15 MG tablet Take 15 mg by mouth every morning.    . methocarbamol (ROBAXIN) 500 MG tablet Take 500 mg by mouth 3 (three) times daily.     . ondansetron (ZOFRAN ODT) 4 MG disintegrating tablet Take 1 tablet (4 mg total) by mouth every 8 (eight) hours as needed for nausea or vomiting. 20 tablet 0  . oxybutynin (DITROPAN-XL) 5 MG 24 hr tablet Take 5 mg by mouth daily.  1  . oxyCODONE-acetaminophen (PERCOCET/ROXICET) 5-325 MG tablet Take 1 tablet by mouth See admin instructions. Take 1 tablet by mouth every 4-6 hours    . ranitidine (ZANTAC) 150 MG tablet  TAKE 1 TABLET BY MOUTH AT BEDTIME (Patient taking differently: Take 150 mg by mouth at bedtime. ) 30 tablet 3  . traMADol (ULTRAM) 50 MG tablet Take 50 mg by mouth every morning.     . metoprolol tartrate (LOPRESSOR) 25 MG tablet Take 1 tablet (25 mg total) by mouth 2 (two) times daily. 180 tablet 3  . ondansetron (ZOFRAN) 4 MG tablet TAKE 1 TABLET BY MOUTH EVERY 6 HOURS AS NEEDED FOR NAUSEA AND VOMITING  0   No current facility-administered medications on file prior to visit.     BP 130/80   Pulse 67   Temp 98.3 F (36.8 C) (Oral)   Ht 5' 2"  (1.575 m)   Wt 198 lb 8 oz (90 kg)   LMP 08/12/2015 Comment: patient states hx. tubal ligation  SpO2 97%   BMI 36.31 kg/m    Objective:   Physical Exam  Constitutional: She appears well-nourished.  Neck: Neck supple.  Cardiovascular: Normal rate and regular rhythm.  Respiratory: Effort normal and breath sounds normal. She has no decreased breath sounds. She has no wheezes.  Musculoskeletal:       Back:  Mild tenderness upon palpation to left thoracic spine.  Skin: Skin is warm and dry.           Assessment & Plan:  CAP:  Diagnosed during hospital stay 10/23-10-25. Treated with IV antibiotics. Completed oral antibiotics since discharge. Overall seems to be doing better. Clear lungs on exam, afebrile. Suspect back pain and right rib cage pain to be MSK. Discussed OTC products for treatment, as well as stretching and heat.  All hospital labs, imaging, notes reviewed.  [name], NP

## 2018-01-07 NOTE — Patient Instructions (Addendum)
Continue to use the incentive spirometer to prevent decrease in lung capacity.   Try using heating pads, lidocaine patches, other topical creams to the back for discomfort. Try stretching to avoid stiffness.   Please call me if you run fevers, start to notice a return in shortness of breath, feel worse.  It was a pleasure to see you today!

## 2018-01-18 DIAGNOSIS — G894 Chronic pain syndrome: Secondary | ICD-10-CM | POA: Diagnosis not present

## 2018-01-18 DIAGNOSIS — M545 Low back pain: Secondary | ICD-10-CM | POA: Diagnosis not present

## 2018-01-18 DIAGNOSIS — Q762 Congenital spondylolisthesis: Secondary | ICD-10-CM | POA: Diagnosis not present

## 2018-02-10 ENCOUNTER — Telehealth: Payer: Self-pay | Admitting: *Deleted

## 2018-02-10 DIAGNOSIS — E7849 Other hyperlipidemia: Secondary | ICD-10-CM

## 2018-02-10 NOTE — Telephone Encounter (Signed)
-----   Message from Raiford Simmonds, RN sent at 08/05/2017  8:25 AM EDT ----- LABS DUE 02/05/18  MAIL@10 /30/19 LETTER ,LABSLIP LIPID, HEPATIC

## 2018-02-10 NOTE — Telephone Encounter (Signed)
MAILED LETTER AND LABSLIP  REQUEST PATIENT TO CALL OFFICE FOR  FOLLOW UP APPOINTMENT

## 2018-02-15 DIAGNOSIS — M5106 Intervertebral disc disorders with myelopathy, lumbar region: Secondary | ICD-10-CM | POA: Diagnosis not present

## 2018-02-15 DIAGNOSIS — Q762 Congenital spondylolisthesis: Secondary | ICD-10-CM | POA: Diagnosis not present

## 2018-02-15 DIAGNOSIS — M545 Low back pain: Secondary | ICD-10-CM | POA: Diagnosis not present

## 2018-02-15 DIAGNOSIS — G894 Chronic pain syndrome: Secondary | ICD-10-CM | POA: Diagnosis not present

## 2018-02-22 DIAGNOSIS — M5136 Other intervertebral disc degeneration, lumbar region: Secondary | ICD-10-CM | POA: Diagnosis not present

## 2018-02-28 DIAGNOSIS — E7849 Other hyperlipidemia: Secondary | ICD-10-CM | POA: Diagnosis not present

## 2018-03-01 LAB — HEPATIC FUNCTION PANEL
ALT: 37 IU/L — AB (ref 0–32)
AST: 20 IU/L (ref 0–40)
Albumin: 4 g/dL (ref 3.5–5.5)
Alkaline Phosphatase: 100 IU/L (ref 39–117)
Bilirubin Total: 0.8 mg/dL (ref 0.0–1.2)
Bilirubin, Direct: 0.16 mg/dL (ref 0.00–0.40)
Total Protein: 6 g/dL (ref 6.0–8.5)

## 2018-03-01 LAB — LIPID PANEL
CHOL/HDL RATIO: 5.8 ratio — AB (ref 0.0–4.4)
CHOLESTEROL TOTAL: 221 mg/dL — AB (ref 100–199)
HDL: 38 mg/dL — ABNORMAL LOW (ref >39)
LDL CALC: 136 mg/dL — AB (ref 0–99)
TRIGLYCERIDES: 237 mg/dL — AB (ref 0–149)
VLDL CHOLESTEROL CAL: 47 mg/dL — AB (ref 5–40)

## 2018-03-08 ENCOUNTER — Telehealth: Payer: Self-pay | Admitting: *Deleted

## 2018-03-08 MED ORDER — ROSUVASTATIN CALCIUM 20 MG PO TABS: 20.0000 mg | ORAL_TABLET | Freq: Every day | ORAL | 3 refills | Status: DC

## 2018-03-08 NOTE — Telephone Encounter (Signed)
LEFT MESSAGE TO CALL BACK - LAB RESULTS  AND PATIENT NEEDS AN APPOINTMENT  MISSED  NOV 2019 RECALL  LABS ROUTED TO PRIMARY

## 2018-03-08 NOTE — Telephone Encounter (Signed)
-----   Message from Leonie Man, MD sent at 03/07/2018  5:11 PM EST ----- Stable liver function test.  Cholesterol panel: Total cholesterol 221 with a elevated triglycerides (indicative of high sugar levels).  HDL okay at 38 and LDL 136 is stable.  At this point, with the cholesterol at the level it is, would probably need to treat with a statin.  Would try starting with Crestor/rosuvastatin 20 mg daily. Prescribe 20 mg rosuvastatin, 1 tab daily.  Dispense 90 with 3 refills.  Glenetta Hew, MD

## 2018-03-08 NOTE — Telephone Encounter (Signed)
The patient has been notified of the result and verbalized understanding.  All questions (if any) were answered. Raiford Simmonds, RN 03/08/2018 2:10 PM  Appointment scheduled

## 2018-03-18 ENCOUNTER — Encounter: Payer: Self-pay | Admitting: Family Medicine

## 2018-03-18 ENCOUNTER — Ambulatory Visit: Payer: 59 | Admitting: Family Medicine

## 2018-03-18 VITALS — BP 108/60 | HR 73 | Temp 98.4°F | Ht 62.0 in | Wt 205.2 lb

## 2018-03-18 DIAGNOSIS — R0602 Shortness of breath: Secondary | ICD-10-CM

## 2018-03-18 MED ORDER — PREDNISONE 10 MG PO TABS: ORAL_TABLET | ORAL | 0 refills | Status: DC

## 2018-03-18 NOTE — Patient Instructions (Signed)
Mucinex twice daily.  Start nasal saline 3-4 times daily.  Complete low dose prednisone taper.  Can use albuterol as needed.  If new fever or shortness of breath not improving.

## 2018-03-18 NOTE — Progress Notes (Signed)
Subjective:    Patient ID: Abigail [name], female    DOB: 11-06-1965, 53 y.o.   MRN: [id number]  Cough  This is a new problem. The current episode started in the past 7 days. The problem has been gradually worsening. The problem occurs every few minutes. The cough is non-productive. Associated symptoms include chest pain, nasal congestion, postnasal drip, a sore throat and shortness of breath. Pertinent negatives include no ear congestion, ear pain, fever, headaches or myalgias. Associated symptoms comments: Sneeze, cough   clear to green nasal mucus.   No sinus pressure. Nothing aggravates the symptoms. Risk factors: nonsmoker. She has tried a beta-agonist inhaler for the symptoms. The treatment provided mild relief. Her past medical history is significant for environmental allergies and pneumonia. There is no history of asthma or COPD.  Shortness of Breath  Associated symptoms include chest pain and a sore throat. Pertinent negatives include no ear pain, fever or headaches. Her past medical history is significant for pneumonia. There is no history of asthma or COPD.    Recent PNA treated with doxy  In 12/2017.. resolved completely   Flu shot 2019  Social History /Family History/Past Medical History reviewed in detail and updated in EMR if needed. Blood pressure 108/60, pulse 73, temperature 98.4 F (36.9 C), temperature source Oral, height 5' 2"  (1.575 m), weight 205 lb 4 oz (93.1 kg), last menstrual period 08/12/2015.  Review of Systems  Constitutional: Negative for fever.  HENT: Positive for postnasal drip and sore throat. Negative for ear pain.   Respiratory: Positive for cough and shortness of breath.   Cardiovascular: Positive for chest pain.  Musculoskeletal: Negative for myalgias.  Allergic/Immunologic: Positive for environmental allergies.  Neurological: Negative for headaches.       Objective:   Physical Exam Constitutional:      General: She is not in acute  distress.    Appearance: She is well-developed. She is not ill-appearing or toxic-appearing.  HENT:     Head: Normocephalic.     Right Ear: Hearing, tympanic membrane, ear canal and external ear normal. Tympanic membrane is not erythematous, retracted or bulging.     Left Ear: Hearing, tympanic membrane, ear canal and external ear normal. Tympanic membrane is not erythematous, retracted or bulging.     Nose: Mucosal edema and rhinorrhea present.     Right Sinus: No maxillary sinus tenderness or frontal sinus tenderness.     Left Sinus: No maxillary sinus tenderness or frontal sinus tenderness.     Mouth/Throat:     Pharynx: Uvula midline.  Eyes:     General: Lids are normal. Lids are everted, no foreign bodies appreciated.     Conjunctiva/sclera: Conjunctivae normal.     Pupils: Pupils are equal, round, and reactive to light.  Neck:     Musculoskeletal: Normal range of motion and neck supple.     Thyroid: No thyroid mass or thyromegaly.     Vascular: No carotid bruit.     Trachea: Trachea normal.  Cardiovascular:     Rate and Rhythm: Normal rate and regular rhythm.     Pulses: Normal pulses.     Heart sounds: Normal heart sounds, S1 normal and S2 normal. No murmur. No friction rub. No gallop.   Pulmonary:     Effort: Pulmonary effort is normal. No tachypnea or respiratory distress.     Breath sounds: Normal breath sounds. No decreased breath sounds, wheezing, rhonchi or rales.  Skin:    General: Skin is warm  and dry.     Findings: No rash.  Neurological:     Mental Status: She is alert.  Psychiatric:        Mood and Affect: Mood is not anxious or depressed.        Speech: Speech normal.        Behavior: Behavior normal. Behavior is cooperative.        Judgment: Judgment normal.           Assessment & Plan:

## 2018-03-18 NOTE — Assessment & Plan Note (Signed)
Recent PNA.  Pt with 1 week of SOb and cough.. improved some with albuterol. nml lung exam, no fever.. doubt PNA recurrence.  More likely viral URI with reactive airways.  Will try low dose taper of prednisone, nasal saline and mucolytic.   If not improving, pt will call.

## 2018-05-02 ENCOUNTER — Encounter: Payer: Self-pay | Admitting: Cardiology

## 2018-05-02 ENCOUNTER — Ambulatory Visit (INDEPENDENT_AMBULATORY_CARE_PROVIDER_SITE_OTHER): Payer: 59 | Admitting: Cardiology

## 2018-05-02 VITALS — BP 127/80 | HR 84 | Ht 62.0 in | Wt 206.0 lb

## 2018-05-02 DIAGNOSIS — R739 Hyperglycemia, unspecified: Secondary | ICD-10-CM | POA: Diagnosis not present

## 2018-05-02 DIAGNOSIS — Z0389 Encounter for observation for other suspected diseases and conditions ruled out: Secondary | ICD-10-CM

## 2018-05-02 DIAGNOSIS — E8881 Metabolic syndrome: Secondary | ICD-10-CM | POA: Diagnosis not present

## 2018-05-02 DIAGNOSIS — F32A Depression, unspecified: Secondary | ICD-10-CM

## 2018-05-02 DIAGNOSIS — E669 Obesity, unspecified: Secondary | ICD-10-CM | POA: Diagnosis not present

## 2018-05-02 DIAGNOSIS — IMO0001 Reserved for inherently not codable concepts without codable children: Secondary | ICD-10-CM

## 2018-05-02 DIAGNOSIS — E7849 Other hyperlipidemia: Secondary | ICD-10-CM

## 2018-05-02 DIAGNOSIS — F329 Major depressive disorder, single episode, unspecified: Secondary | ICD-10-CM

## 2018-05-02 DIAGNOSIS — F419 Anxiety disorder, unspecified: Secondary | ICD-10-CM

## 2018-05-02 DIAGNOSIS — I493 Ventricular premature depolarization: Secondary | ICD-10-CM

## 2018-05-02 NOTE — Patient Instructions (Signed)
Medication Instructions:  NOT NEEDED If you need a refill on your cardiac medications before your next appointment, please call your pharmacy.   Lab work: IN MARCH 2020- PLEASE HAVE LAB WORK DONE LIPID CMP HGBA1C- FOLLOW INSTRUCTION ON ORANGE PAPER  If you have labs (blood work) drawn today and your tests are completely normal, you will receive your results only by: [name] MyChart Message (if you have MyChart) OR . A paper copy in the mail If you have any lab test that is abnormal or we need to change your treatment, we will call you to review the results.  Testing/Procedures:  NOT NEEDED  Follow-Up: At [facility], you and your health needs are our priority.  As part of our continuing mission to provide you with exceptional heart care, we have created designated Provider Care Teams.  These Care Teams include your primary Cardiologist (physician) and Advanced Practice Providers (APPs -  Physician Assistants and Nurse Practitioners) who all work together to provide you with the care you need, when you need it. You will need a follow up appointment in 6 months aug 2020.  Please call our office 2 months in advance to schedule this appointment.  You may see Glenetta Hew, MDor one of the following Advanced Practice Providers on your designated Care Team:   Rosaria Ferries, PA-C . Jory Sims, DNP, ANP  Any Other Special Instructions Will Be Listed Below (If Applicable).

## 2018-05-02 NOTE — Progress Notes (Signed)
PCP: [name], NP  Clinic Note: Chief Complaint  Patient presents with  . Follow-up    19-month . Chest Pain    Mostly musculoskeletal  . Palpitations    Much better controlled     HPI: CAVREE SZCZYGIELis a 53y.o. female with a PMH below who presents today for "a 6-8 month check-up" with her cardiologist.  I fist saw her in May 2012 for chest pain. She had a cath showing normal coronaries and normal LVF. She has been treated with low dose beta blocker for palpitations - has been increased to BID..[name]   CTrinidy MastersonSizemore was last seen in May 2019 in f/u for episodes of shortness of breath exertional dyspnea and some palpitations.- Echo reviewed.  Recent Hospitalizations:   12/27/2017: OP Rotator Cuff Sgx.  ER-Admission 12/30/2018 - CAP with pleuritic CP. Rx with Abx.   Studies Personally Reviewed - (if available, images/films reviewed: From Epic Chart or Care Everywhere)  None  Interval History: CJeananneis here today for routine "follow-up ".  She says her palpitations have calmed down some with twice daily beta-blocker but are still there every now and then.  She also notes having intermittent episodes of "gasping for air and feeling her chest feel tight.  This can come and go regardless of any activity.  Usually happens at rest and not with exertion.  She can sometimes get short of breath with eating or talking, but not necessarily with walking. She has occasional fleeting episodes of dizziness.  She has mild edema, but no real PND orthopnea.  She had been walking about 2 miles at a time, but is not really doing that much anymore now so she is getting short of breath if she does minimal activity but is quite out of shape.   She still is not having any soft-drinks & coffee, which did make a difference in her palpitations..   No syncope or near syncope, TIA or amaurosis fugax.  No claudication.   ROS: A comprehensive was performed. Review of Systems    Constitutional: Positive for malaise/fatigue (Daytime sleepiness; not as active since shoulder Sx).  HENT: Negative for congestion, hearing loss and nosebleeds.   Respiratory: Positive for shortness of breath (Only if she overdoes it - see HPI). Negative for cough.   Cardiovascular: Positive for leg swelling (mild, end of day).  Gastrointestinal: Positive for heartburn (Bad GERD). Negative for abdominal pain and constipation.  Genitourinary: Negative for hematuria.  Musculoskeletal: Positive for back pain, joint pain and myalgias.       Diffuse MSK discomfort--shoulders, hips and back.  Takes as needed Vicodin or ibuprofen/Mobic..[name]   Neurological: Negative for dizziness, focal weakness and weakness.  Psychiatric/Behavioral: Negative for depression (At least some dysthymia) and substance abuse. The patient is nervous/anxious and has insomnia (can get SOB rolling over in bed - no Orthopnea).   All other systems reviewed and are negative.  I have reviewed and (if needed) personally updated the patient's problem list, medications, allergies, past medical and surgical history, social and family history.   Past Medical History:  Diagnosis Date  . Anxiety   . Bulging lumbar disc   . CAP (community acquired pneumonia)   . Cardiac arrhythmia due to congenital heart disease    no arrhythmia identified   . Celiac disease   . Depression   . Diverticulosis    Sigmoid colon  . Fatty liver 05/2016  . Frequent headaches   . GERD (  gastroesophageal reflux disease)   . Heart disease   . History of kidney stones   . Hypertension   . Kidney stones   . Migraine   . Obesity (BMI 35.0-39.9 without comorbidity)   . UTI (urinary tract infection)     Past Surgical History:  Procedure Laterality Date  . ABDOMINAL EXPLORATION SURGERY  1986  . COLONOSCOPY  06/2016  . EXTRACORPOREAL SHOCK WAVE LITHOTRIPSY Left 12/03/2016   Procedure: LEFT EXTRACORPOREAL SHOCK WAVE LITHOTRIPSY (ESWL);  Surgeon: Irine Seal, MD;  Location: [facility];  Service: Urology;  Laterality: Left;  . KIDNEY STONE SURGERY    . LEFT HEART CATH AND CORONARY ANGIOGRAPHY  07/28/2016    Large tortuous coronary arteries no radiographic evidence of disease.  Not anginal chest pain.  Normal EF.  [name] LITHOTRIPSY    . NASAL SINUS SURGERY    . ROTATOR CUFF REPAIR Right 12/27/2017  . TRANSTHORACIC ECHOCARDIOGRAM  06/2017   EF 60-65%.  GR 1-2 DD.  Otherwise normal echo.  Normal valve function.  Normal wall motion.  . TUBAL LIGATION      Current Meds  Medication Sig  . albuterol (PROVENTIL HFA;VENTOLIN HFA) 108 (90 Base) MCG/ACT inhaler Inhale 1-2 puffs into the lungs every 6 (six) hours as needed for wheezing or shortness of breath (cough). (Patient taking differently: Inhale 1-2 puffs into the lungs every 6 (six) hours as needed (coughing, shortness of breath, or wheezing). )  . aspirin 81 MG chewable tablet Chew 81 mg by mouth daily.  . benzonatate (TESSALON) 200 MG capsule Take 1 capsule (200 mg total) by mouth 3 (three) times daily as needed. (Patient taking differently: Take 200 mg by mouth 3 (three) times daily as needed for cough. )  . Chlorpheniramine-PSE-Ibuprofen 2-30-200 MG TABS Take 1-2 tablets by mouth every 6 (six) hours as needed (for cold symptoms).  [name] esomeprazole (NEXIUM) 40 MG capsule TAKE 1 CAPSULE BY MOUTH DAILY (Patient taking differently: Take 40 mg by mouth daily before breakfast. )  . FLUoxetine (PROZAC) 40 MG capsule Take 1 capsule (40 mg total) by mouth daily.  [name] HYDROcodone-acetaminophen (NORCO/VICODIN) 5-325 MG tablet Take 1 tablet by mouth every 6 (six) hours as needed. for pain  . ibuprofen (ADVIL,MOTRIN) 200 MG tablet Take 400 mg by mouth every 6 (six) hours as needed for pain or headache.  . levothyroxine (SYNTHROID, LEVOTHROID) 50 MCG tablet Take 1 tablet by mouth every morning on an empty stomach with water. No food or other medications for 30 min. (Patient taking differently: Take 50 mcg by mouth daily  before breakfast. No food or other medications for 30 min.)  . LINZESS 72 MCG capsule TAKE 1 CAPSULE BY MOUTH ONCE DAILY BEFORE BREAKFAST (Patient taking differently: Take 72 mcg by mouth daily as needed (for constipation). )  . meloxicam (MOBIC) 15 MG tablet Take 15 mg by mouth every morning.  . methocarbamol (ROBAXIN) 500 MG tablet Take 500 mg by mouth 3 (three) times daily.   . ondansetron (ZOFRAN ODT) 4 MG disintegrating tablet Take 1 tablet (4 mg total) by mouth every 8 (eight) hours as needed for nausea or vomiting.  . ondansetron (ZOFRAN) 4 MG tablet TAKE 1 TABLET BY MOUTH EVERY 6 HOURS AS NEEDED FOR NAUSEA AND VOMITING  . oxybutynin (DITROPAN-XL) 5 MG 24 hr tablet Take 5 mg by mouth daily.  [name] oxyCODONE-acetaminophen (PERCOCET/ROXICET) 5-325 MG tablet Take 1 tablet by mouth See admin instructions. Take 1 tablet by mouth every 4-6 hours  . predniSONE (DELTASONE)  10 MG tablet 3 tabs by mouth daily x 3 days, then 2 tabs by mouth daily x 2 days then 1 tab by mouth daily x 2 days  . ranitidine (ZANTAC) 150 MG tablet TAKE 1 TABLET BY MOUTH AT BEDTIME (Patient taking differently: Take 150 mg by mouth at bedtime. )  . rosuvastatin (CRESTOR) 20 MG tablet Take 1 tablet (20 mg total) by mouth daily.  . traMADol (ULTRAM) 50 MG tablet Take 50 mg by mouth every morning.     Allergies  Allergen Reactions  . Penicillins Nausea Only    Has patient had a PCN reaction causing immediate rash, facial/tongue/throat swelling, SOB or lightheadedness with hypotension: No Has patient had a PCN reaction causing severe rash involving mucus membranes or skin necrosis: No Has patient had a PCN reaction that required hospitalization: No Has patient had a PCN reaction occurring within the last 10 years: No If all of the above answers are "NO", then may proceed with Cephalosporin use.   . Latex Rash    Social History   Tobacco Use  . Smoking status: Former Smoker    Types: Cigarettes    Last attempt to quit:  2008    Years since quitting: 12.1  . Smokeless tobacco: Never Used  . Tobacco comment: used tobacco "socially"  Substance Use Topics  . Alcohol use: No    Alcohol/week: 0.0 standard drinks  . Drug use: No   Social History   Social History Narrative   Single.   Has a set of Twins, age 27.   Works for the [employer] and [employer] and [employer]   Enjoys relaxing, spending time with her children.    family history includes COPD in her father; Emphysema in her father; Gallstones in her mother.  Wt Readings from Last 3 Encounters:  05/02/18 206 lb (93.4 kg)  03/18/18 205 lb 4 oz (93.1 kg)  01/07/18 198 lb 8 oz (90 kg)    PHYSICAL EXAM BP 127/80   Pulse 84   Ht 5' 2"  (1.575 m)   Wt 206 lb (93.4 kg)   LMP 08/12/2015 Comment: patient states hx. tubal ligation  SpO2 95%   BMI 37.68 kg/m  Physical Exam  Constitutional: She is oriented to person, place, and time. She appears well-developed and well-nourished. No distress.  Moderately obese.  Somewhat shy, but more talkative than usual  HENT:  Head: Normocephalic and atraumatic.  Neck: Normal range of motion. Neck supple. No hepatojugular reflux and no JVD present. Carotid bruit is not present.  Cardiovascular: Normal rate, regular rhythm, normal heart sounds, intact distal pulses and normal pulses.  No extrasystoles are present. PMI is not displaced (unable to palpate). Exam reveals no gallop and no friction rub.  No murmur heard. Pulmonary/Chest: Effort normal and breath sounds normal. No respiratory distress. She has no wheezes. She has no rales. She exhibits tenderness (Mild costochondral/sternal tenderness).  Abdominal: Soft. Bowel sounds are normal. She exhibits no distension. There is no abdominal tenderness. There is no rebound.  Obese.  No HSM  Musculoskeletal: Normal range of motion.        General: No edema (Trivial).  Neurological: She is alert and oriented to person, place, and time.  Psychiatric: She has a  normal mood and affect. Her behavior is normal. Judgment and thought content normal.  Vitals reviewed.    Adult ECG Report n/a   Other studies Reviewed: Additional studies/ records that were reviewed today include:  Recent Labs:  Lab  Results  Component Value Date   CHOL 221 (H) 02/28/2018   HDL 38 (L) 02/28/2018   LDLCALC 136 (H) 02/28/2018   TRIG 237 (H) 02/28/2018   CHOLHDL 5.8 (H) 02/28/2018    Lab Results  Component Value Date   CREATININE 0.65 12/31/2017   BUN 14 12/31/2017   NA 143 12/31/2017   K 4.0 12/31/2017   CL 107 12/31/2017   CO2 29 12/31/2017    ASSESSMENT / PLAN: Problem List Items Addressed This Visit    Anxiety and depression    Monitor closely by PCP.  I think this is where a lot of her palpitation symptoms come from.  A lot of the strange symptoms she describes are probably somewhat psychosomatic.      Hyperlipidemia due to dietary fat intake (Chronic)    Check soon.  I did provide lab slip just in case this can be done in March with PCP.  She was just started on a statin with an LDL 136.  We should at least target LDL less than 100 given her risk factors which basically combined to be metabolic syndrome.      Relevant Orders   Lipid panel   Hemoglobin A1c   Normal coronary arteries- 2012 (Chronic)    Had negative cath in 2012, relatively normal follow-up since then.  No active angina symptoms.  Is on low-dose statin along with aspirin and beta-blocker.      Obesity (BMI 30.0-34.9) - Primary (Chronic)   Relevant Orders   Lipid panel   Comprehensive metabolic panel   Hemoglobin A1c   PVC's (premature ventricular contractions) (Chronic)    Doing much better with higher dose beta-blocker.  As long as her stress levels are okay and she is avoiding caffeine, they seem to be doing well.       Other Visit Diagnoses    Hyperglycemia  (Chronic)      Relevant Orders   Hemoglobin U5K   Metabolic syndrome  (Chronic)      Relevant Orders    Comprehensive metabolic panel   Hemoglobin A1c     Her symptoms of palpitation and chest cover seem to be stable.  Nothing really worse than she had had before.  Is on stable dose of beta-blocker and has cut back caffeine. She was just started on rosuvastatin by her PCP and is due for follow-up labs in March --> I have provided lab slip just to make sure that these labs are done with PCP.[name]  Dorothie needs quite a bit of reassuring, but was doing well overall.  I did spend at least 20 to 25 minutes with her and that her chart.>>  50% of the time was directly with her discussing her symptoms and plan of care.  Current medicines are reviewed at length with the patient today.  (+/- concerns) n/a The following changes have been made: n/a  Patient Instructions  Medication Instructions:  NOT NEEDED If you need a refill on your cardiac medications before your next appointment, please call your pharmacy.   Lab work: IN MARCH 2020- PLEASE HAVE LAB WORK DONE LIPID CMP HGBA1C- FOLLOW INSTRUCTION ON ORANGE PAPER  If you have labs (blood work) drawn today and your tests are completely normal, you will receive your results only by: [name] MyChart Message (if you have MyChart) OR . A paper copy in the mail If you have any lab test that is abnormal or we need to change your treatment, we will call you to review the  results.  Testing/Procedures:  NOT NEEDED  Follow-Up: At [facility], you and your health needs are our priority.  As part of our continuing mission to provide you with exceptional heart care, we have created designated Provider Care Teams.  These Care Teams include your primary Cardiologist (physician) and Advanced Practice Providers (APPs -  Physician Assistants and Nurse Practitioners) who all work together to provide you with the care you need, when you need it. You will need a follow up appointment in 6 months aug 2020.  Please call our office 2 months in advance to schedule this  appointment.  You may see Glenetta Hew, MDor one of the following Advanced Practice Providers on your designated Care Team:   Rosaria Ferries, PA-C . Jory Sims, DNP, ANP  Any Other Special Instructions Will Be Listed Below (If Applicable).     Studies Ordered:   Orders Placed This Encounter  Procedures  . Lipid panel  . Comprehensive metabolic panel  . Hemoglobin A1c      Glenetta Hew, M.D., M.S. Interventional Cardiologist   Pager # [phone] Phone # [phone] [address] [place], [place] [address]   Thank you for choosing Heart[facility] at [facility]!!

## 2018-05-08 ENCOUNTER — Encounter: Payer: Self-pay | Admitting: Cardiology

## 2018-05-08 NOTE — Assessment & Plan Note (Signed)
Check soon.  I did provide lab slip just in case this can be done in March with PCP.  She was just started on a statin with an LDL 136.  We should at least target LDL less than 100 given her risk factors which basically combined to be metabolic syndrome.

## 2018-05-08 NOTE — Assessment & Plan Note (Signed)
Monitor closely by PCP.  I think this is where a lot of her palpitation symptoms come from.  A lot of the strange symptoms she describes are probably somewhat psychosomatic.

## 2018-05-08 NOTE — Assessment & Plan Note (Signed)
Doing much better with higher dose beta-blocker.  As long as her stress levels are okay and she is avoiding caffeine, they seem to be doing well.

## 2018-05-08 NOTE — Assessment & Plan Note (Signed)
Had negative cath in 2012, relatively normal follow-up since then.  No active angina symptoms.  Is on low-dose statin along with aspirin and beta-blocker.

## 2018-05-24 DIAGNOSIS — Z79891 Long term (current) use of opiate analgesic: Secondary | ICD-10-CM | POA: Diagnosis not present

## 2018-05-24 DIAGNOSIS — Z79899 Other long term (current) drug therapy: Secondary | ICD-10-CM | POA: Diagnosis not present

## 2018-05-24 DIAGNOSIS — G894 Chronic pain syndrome: Secondary | ICD-10-CM | POA: Diagnosis not present

## 2018-05-24 DIAGNOSIS — Q762 Congenital spondylolisthesis: Secondary | ICD-10-CM | POA: Diagnosis not present

## 2018-05-24 DIAGNOSIS — M545 Low back pain: Secondary | ICD-10-CM | POA: Diagnosis not present

## 2018-05-25 ENCOUNTER — Telehealth: Payer: Self-pay

## 2018-05-25 DIAGNOSIS — R1011 Right upper quadrant pain: Secondary | ICD-10-CM

## 2018-05-25 DIAGNOSIS — E7849 Other hyperlipidemia: Secondary | ICD-10-CM | POA: Diagnosis not present

## 2018-05-25 DIAGNOSIS — E669 Obesity, unspecified: Secondary | ICD-10-CM | POA: Diagnosis not present

## 2018-05-25 DIAGNOSIS — R739 Hyperglycemia, unspecified: Secondary | ICD-10-CM | POA: Diagnosis not present

## 2018-05-25 DIAGNOSIS — E8881 Metabolic syndrome: Secondary | ICD-10-CM | POA: Diagnosis not present

## 2018-05-25 NOTE — Telephone Encounter (Signed)
Pt left v/m requesting refill for zofran for nausea.

## 2018-05-26 LAB — COMPREHENSIVE METABOLIC PANEL
ALK PHOS: 112 IU/L (ref 39–117)
ALT: 61 IU/L — AB (ref 0–32)
AST: 38 IU/L (ref 0–40)
Albumin/Globulin Ratio: 2 (ref 1.2–2.2)
Albumin: 4.4 g/dL (ref 3.8–4.9)
BUN/Creatinine Ratio: 22 (ref 9–23)
BUN: 15 mg/dL (ref 6–24)
Bilirubin Total: 0.6 mg/dL (ref 0.0–1.2)
CALCIUM: 9.5 mg/dL (ref 8.7–10.2)
CO2: 24 mmol/L (ref 20–29)
CREATININE: 0.69 mg/dL (ref 0.57–1.00)
Chloride: 103 mmol/L (ref 96–106)
GFR calc Af Amer: 115 mL/min/{1.73_m2} (ref >59)
GFR calc non Af Amer: 100 mL/min/{1.73_m2} (ref >59)
GLOBULIN, TOTAL: 2.2 g/dL (ref 1.5–4.5)
Glucose: 107 mg/dL — ABNORMAL HIGH (ref 65–99)
POTASSIUM: 5.1 mmol/L (ref 3.5–5.2)
SODIUM: 143 mmol/L (ref 134–144)
Total Protein: 6.6 g/dL (ref 6.0–8.5)

## 2018-05-26 LAB — LIPID PANEL
CHOLESTEROL TOTAL: 144 mg/dL (ref 100–199)
Chol/HDL Ratio: 4.1 ratio (ref 0.0–4.4)
HDL: 35 mg/dL — ABNORMAL LOW (ref >39)
LDL Calculated: 80 mg/dL (ref 0–99)
Triglycerides: 146 mg/dL (ref 0–149)
VLDL CHOLESTEROL CAL: 29 mg/dL (ref 5–40)

## 2018-05-26 LAB — HEMOGLOBIN A1C
ESTIMATED AVERAGE GLUCOSE: 120 mg/dL
HEMOGLOBIN A1C: 5.8 % — AB (ref 4.8–5.6)

## 2018-05-26 MED ORDER — ONDANSETRON 4 MG PO TBDP: 4.0000 mg | ORAL_TABLET | Freq: Three times a day (TID) | ORAL | 0 refills | Status: DC | PRN

## 2018-05-26 NOTE — Telephone Encounter (Signed)
Rx have not been prescribed. Last office visit on 03/18/2018 with Dr Diona Browner for acute. No future appointment

## 2018-05-26 NOTE — Telephone Encounter (Signed)
Noted, refill sent to pharmacy. 

## 2018-05-26 NOTE — Telephone Encounter (Signed)
Why does she take Zofran? How often does she take it? It's not prescribed as a routine medication.

## 2018-05-26 NOTE — Telephone Encounter (Signed)
Patient has this unknown nausea that she had for years. Patient stated that it rare, she can't remember the last time but it was months ago.   Patient had a bad episode the other day and noticed that she does not have any so she called. It helps her when she get these episode.

## 2018-06-20 ENCOUNTER — Encounter: Payer: Self-pay | Admitting: Primary Care

## 2018-06-20 ENCOUNTER — Ambulatory Visit (INDEPENDENT_AMBULATORY_CARE_PROVIDER_SITE_OTHER): Payer: 59 | Admitting: Primary Care

## 2018-06-20 DIAGNOSIS — J309 Allergic rhinitis, unspecified: Secondary | ICD-10-CM

## 2018-06-20 NOTE — Progress Notes (Signed)
Subjective:    Patient ID: Abigail [name], female    DOB: 07/11/1965, 53 y.o.   MRN: [id number]  HPI     Abigail [name] - 53 y.o. female  MRN [id number]  Date of Birth: Jun 28, 1965  PCP: [name], NP  This service was provided via telemedicine. Phone Visit performed on 06/20/2018    Rationale for phone visit along with limitations reviewed. Patient consented to telephone encounter.    Location of patient: Home Location of provider: [place] @ [facility] Name of referring provider: N/A   Names of persons and role in encounter: Provider: [name], NP  Patient: Abigail [name]  Other: N/A   Time on call: 23 minutes   Subjective: CC: Allergy symptoms  HPI:  Abigail [name] is a 53 year old female with a history of allergic rhinitis, CAP, GERD, chronic back pain, obesity who presents today with a chief complaint of body aches.   She also reports otalgia (right ear), headaches, nausea, sneezing, itchy/watery eyes. She denies fevers, cough, sick contacts, shortness of breath. She's been taking Tylenol, Advil Sinus, Nasacort, and Saline Nasal spray without much improvement. Her main concern is headache. Her symptoms began about three weeks ago.  Her headaches are located to the mid occipital lobe with radiation to the parietal lobe to frontal lobes. She does have nausea with headaches. She's also been under a lot of stress at work.  She is taking Norco for her chronic back pain for which she's "as needed" which is every several days. She is taking Tramadol 1-2 times daily, everyday.   Objective/Observations:   No physical exam or vital signs collected unless specifically identified below.   LMP 08/12/2015 Comment: patient states hx. tubal ligation   Respiratory status: speaks in complete sentences without evident shortness of breath.   Assessment/Plan:  Symptoms representative of allergy involvement.  She is not coughing during  visit, speaking in complete sentences. Sounds well. Do not suspect Covid-19. Discussed to continue Nasacort, add in Zyrtec. Discussed use of Excedrin Migraine for headaches.  She will update.  No problem-specific Assessment & Plan notes found for this encounter.   I discussed the assessment and treatment plan with the patient. The patient was provided an opportunity to ask questions and all were answered. The patient agreed with the plan and demonstrated an understanding of the instructions.  Lab Orders  No laboratory test(s) ordered today    No orders of the defined types were placed in this encounter.   The patient was advised to call back or seek an in-person evaluation if the symptoms worsen or if the condition fails to improve as anticipated.  [name], NP    Review of Systems  Constitutional: Negative for chills, fatigue and fever.  HENT: Positive for ear pain, postnasal drip and sneezing. Negative for congestion, sinus pressure, sinus pain and sore throat.   Eyes: Positive for itching.  Respiratory: Negative for cough and shortness of breath.   Cardiovascular: Negative for chest pain.  Musculoskeletal: Positive for arthralgias.       Past Medical History:  Diagnosis Date  . Anxiety   . Bulging lumbar disc   . CAP (community acquired pneumonia)   . Cardiac arrhythmia due to congenital heart disease    no arrhythmia identified   . Celiac disease   . Depression   . Diverticulosis    Sigmoid colon  . Fatty liver 05/2016  . Frequent headaches   . GERD (  gastroesophageal reflux disease)   . Heart disease   . History of kidney stones   . Hypertension   . Kidney stones   . Migraine   . Obesity (BMI 35.0-39.9 without comorbidity)   . UTI (urinary tract infection)      Social History   Socioeconomic History  . Marital status: Single    Spouse name: Not on file  . Number of children: Not on file  . Years of education: Not on file  . Highest education  level: Not on file  Occupational History  . Not on file  Social Needs  . Financial resource strain: Not on file  . Food insecurity:    Worry: Not on file    Inability: Not on file  . Transportation needs:    Medical: Not on file    Non-medical: Not on file  Tobacco Use  . Smoking status: Former Smoker    Types: Cigarettes    Last attempt to quit: 2008    Years since quitting: 12.2  . Smokeless tobacco: Never Used  . Tobacco comment: used tobacco "socially"  Substance and Sexual Activity  . Alcohol use: No    Alcohol/week: 0.0 standard drinks  . Drug use: No  . Sexual activity: Not on file  Lifestyle  . Physical activity:    Days per week: Not on file    Minutes per session: Not on file  . Stress: Not on file  Relationships  . Social connections:    Talks on phone: Not on file    Gets together: Not on file    Attends religious service: Not on file    Active member of club or organization: Not on file    Attends meetings of clubs or organizations: Not on file    Relationship status: Not on file  . Intimate partner violence:    Fear of current or ex partner: Not on file    Emotionally abused: Not on file    Physically abused: Not on file    Forced sexual activity: Not on file  Other Topics Concern  . Not on file  Social History Narrative   Single.   Has a set of Twins, age 59.   Works for the [employer] and [employer] and [employer]   Enjoys relaxing, spending time with her children.    Past Surgical History:  Procedure Laterality Date  . ABDOMINAL EXPLORATION SURGERY  1986  . COLONOSCOPY  06/2016  . EXTRACORPOREAL SHOCK WAVE LITHOTRIPSY Left 12/03/2016   Procedure: LEFT EXTRACORPOREAL SHOCK WAVE LITHOTRIPSY (ESWL);  Surgeon: Irine Seal, MD;  Location: [facility];  Service: Urology;  Laterality: Left;  . KIDNEY STONE SURGERY    . LEFT HEART CATH AND CORONARY ANGIOGRAPHY  07/28/2016    Large tortuous coronary arteries no radiographic evidence of disease.  Not  anginal chest pain.  Normal EF.  [name] LITHOTRIPSY    . NASAL SINUS SURGERY    . ROTATOR CUFF REPAIR Right 12/27/2017  . TRANSTHORACIC ECHOCARDIOGRAM  06/2017   EF 60-65%.  GR 1-2 DD.  Otherwise normal echo.  Normal valve function.  Normal wall motion.  . TUBAL LIGATION      Family History  Problem Relation Age of Onset  . COPD Father   . Emphysema Father   . Gallstones Mother   . Colon cancer Neg Hx   . Stomach cancer Neg Hx   . Esophageal cancer Neg Hx     Allergies  Allergen Reactions  .  Penicillins Nausea Only    Has patient had a PCN reaction causing immediate rash, facial/tongue/throat swelling, SOB or lightheadedness with hypotension: No Has patient had a PCN reaction causing severe rash involving mucus membranes or skin necrosis: No Has patient had a PCN reaction that required hospitalization: No Has patient had a PCN reaction occurring within the last 10 years: No If all of the above answers are "NO", then may proceed with Cephalosporin use.   . Latex Rash    Current Outpatient Medications on File Prior to Visit  Medication Sig Dispense Refill  . albuterol (PROVENTIL HFA;VENTOLIN HFA) 108 (90 Base) MCG/ACT inhaler Inhale 1-2 puffs into the lungs every 6 (six) hours as needed for wheezing or shortness of breath (cough). (Patient taking differently: Inhale 1-2 puffs into the lungs every 6 (six) hours as needed (coughing, shortness of breath, or wheezing). ) 1 Inhaler 0  . aspirin 81 MG chewable tablet Chew 81 mg by mouth daily.    . benzonatate (TESSALON) 200 MG capsule Take 1 capsule (200 mg total) by mouth 3 (three) times daily as needed. (Patient taking differently: Take 200 mg by mouth 3 (three) times daily as needed for cough. ) 30 capsule 0  . Chlorpheniramine-PSE-Ibuprofen 2-30-200 MG TABS Take 1-2 tablets by mouth every 6 (six) hours as needed (for cold symptoms).    [name] esomeprazole (NEXIUM) 40 MG capsule TAKE 1 CAPSULE BY MOUTH DAILY (Patient taking differently: Take  40 mg by mouth daily before breakfast. ) 90 capsule 1  . FLUoxetine (PROZAC) 40 MG capsule Take 1 capsule (40 mg total) by mouth daily. 90 capsule 3  . HYDROcodone-acetaminophen (NORCO/VICODIN) 5-325 MG tablet Take 1 tablet by mouth every 6 (six) hours as needed. for pain    . ibuprofen (ADVIL,MOTRIN) 200 MG tablet Take 400 mg by mouth every 6 (six) hours as needed for pain or headache.    . levothyroxine (SYNTHROID, LEVOTHROID) 50 MCG tablet Take 1 tablet by mouth every morning on an empty stomach with water. No food or other medications for 30 min. (Patient taking differently: Take 50 mcg by mouth daily before breakfast. No food or other medications for 30 min.) 90 tablet 3  . LINZESS 72 MCG capsule TAKE 1 CAPSULE BY MOUTH ONCE DAILY BEFORE BREAKFAST (Patient taking differently: Take 72 mcg by mouth daily as needed (for constipation). ) 30 capsule 11  . meloxicam (MOBIC) 15 MG tablet Take 15 mg by mouth every morning.    . methocarbamol (ROBAXIN) 500 MG tablet Take 500 mg by mouth 3 (three) times daily.     . ondansetron (ZOFRAN ODT) 4 MG disintegrating tablet Take 1 tablet (4 mg total) by mouth every 8 (eight) hours as needed for nausea or vomiting. 10 tablet 0  . oxybutynin (DITROPAN-XL) 5 MG 24 hr tablet Take 5 mg by mouth daily.  1  . traMADol (ULTRAM) 50 MG tablet Take 50 mg by mouth every morning.     . metoprolol tartrate (LOPRESSOR) 25 MG tablet Take 1 tablet (25 mg total) by mouth 2 (two) times daily. 180 tablet 3  . rosuvastatin (CRESTOR) 20 MG tablet Take 1 tablet (20 mg total) by mouth daily. 90 tablet 3   No current facility-administered medications on file prior to visit.     LMP 08/12/2015 Comment: patient states hx. tubal ligation   Objective:   Physical Exam  Constitutional: She is oriented to person, place, and time.  Respiratory: Effort normal. No respiratory distress.  Neurological: She is alert  and oriented to person, place, and time.  Psychiatric: She has a normal  mood and affect.           Assessment & Plan:

## 2018-06-20 NOTE — Patient Instructions (Signed)
Continue Nasacort and saline spray.  Start Zyrtec nightly for allergies.  You can take Excedrin Migraine as needed for headaches.   Please notify me if you develop persistent fevers of 101, develop shortness of breath or increased shortness of breath, your aches persist.  It was a pleasure to speak to you today!

## 2018-06-20 NOTE — Assessment & Plan Note (Signed)
Symptoms representative of allergy involvement.  She is not coughing during visit, speaking in complete sentences. Sounds well. Do not suspect Covid-19. Discussed to continue Nasacort, add in Zyrtec. Discussed use of Excedrin Migraine for headaches.  She will update.

## 2018-07-01 ENCOUNTER — Emergency Department (HOSPITAL_COMMUNITY): Payer: 59

## 2018-07-01 ENCOUNTER — Other Ambulatory Visit: Payer: Self-pay

## 2018-07-01 ENCOUNTER — Encounter (HOSPITAL_COMMUNITY): Payer: Self-pay | Admitting: Emergency Medicine

## 2018-07-01 ENCOUNTER — Emergency Department (HOSPITAL_COMMUNITY)
Admission: EM | Admit: 2018-07-01 | Discharge: 2018-07-01 | Disposition: A | Discharge status: Home / Self Care | Payer: 59 | Attending: Emergency Medicine | Admitting: Emergency Medicine

## 2018-07-01 DIAGNOSIS — Z79899 Other long term (current) drug therapy: Secondary | ICD-10-CM | POA: Diagnosis not present

## 2018-07-01 DIAGNOSIS — R109 Unspecified abdominal pain: Secondary | ICD-10-CM | POA: Diagnosis not present

## 2018-07-01 DIAGNOSIS — R1011 Right upper quadrant pain: Secondary | ICD-10-CM | POA: Insufficient documentation

## 2018-07-01 DIAGNOSIS — E039 Hypothyroidism, unspecified: Secondary | ICD-10-CM | POA: Diagnosis not present

## 2018-07-01 DIAGNOSIS — N201 Calculus of ureter: Secondary | ICD-10-CM

## 2018-07-01 DIAGNOSIS — Z87891 Personal history of nicotine dependence: Secondary | ICD-10-CM | POA: Insufficient documentation

## 2018-07-01 DIAGNOSIS — Z7982 Long term (current) use of aspirin: Secondary | ICD-10-CM | POA: Insufficient documentation

## 2018-07-01 DIAGNOSIS — R110 Nausea: Secondary | ICD-10-CM

## 2018-07-01 DIAGNOSIS — I10 Essential (primary) hypertension: Secondary | ICD-10-CM | POA: Insufficient documentation

## 2018-07-01 DIAGNOSIS — R1031 Right lower quadrant pain: Secondary | ICD-10-CM | POA: Insufficient documentation

## 2018-07-01 DIAGNOSIS — K760 Fatty (change of) liver, not elsewhere classified: Secondary | ICD-10-CM | POA: Diagnosis not present

## 2018-07-01 DIAGNOSIS — N202 Calculus of kidney with calculus of ureter: Secondary | ICD-10-CM | POA: Diagnosis not present

## 2018-07-01 LAB — CBC WITH DIFFERENTIAL/PLATELET
Abs Immature Granulocytes: 0.06 10*3/uL (ref 0.00–0.07)
Basophils Absolute: 0.1 10*3/uL (ref 0.0–0.1)
Basophils Relative: 1 %
Eosinophils Absolute: 0.2 10*3/uL (ref 0.0–0.5)
Eosinophils Relative: 2 %
HCT: 38.7 % (ref 36.0–46.0)
Hemoglobin: 12.2 g/dL (ref 12.0–15.0)
Immature Granulocytes: 1 %
Lymphocytes Relative: 13 %
Lymphs Abs: 1.4 10*3/uL (ref 0.7–4.0)
MCH: 28.7 pg (ref 26.0–34.0)
MCHC: 31.5 g/dL (ref 30.0–36.0)
MCV: 91.1 fL (ref 80.0–100.0)
Monocytes Absolute: 0.5 10*3/uL (ref 0.1–1.0)
Monocytes Relative: 5 %
Neutro Abs: 8.4 10*3/uL — ABNORMAL HIGH (ref 1.7–7.7)
Neutrophils Relative %: 78 %
Platelets: 251 10*3/uL (ref 150–400)
RBC: 4.25 MIL/uL (ref 3.87–5.11)
RDW: 13.5 % (ref 11.5–15.5)
WBC: 10.6 10*3/uL — ABNORMAL HIGH (ref 4.0–10.5)
nRBC: 0 % (ref 0.0–0.2)

## 2018-07-01 LAB — URINALYSIS, ROUTINE W REFLEX MICROSCOPIC
Bilirubin Urine: NEGATIVE
Glucose, UA: NEGATIVE mg/dL
Ketones, ur: 5 mg/dL — AB
Nitrite: NEGATIVE
Protein, ur: NEGATIVE mg/dL
RBC / HPF: >50 RBC/hpf — ABNORMAL HIGH (ref 0–5)
Specific Gravity, Urine: 1.024 (ref 1.005–1.030)
pH: 5 (ref 5.0–8.0)

## 2018-07-01 LAB — COMPREHENSIVE METABOLIC PANEL
ALT: 72 U/L — ABNORMAL HIGH (ref 0–44)
AST: 74 U/L — ABNORMAL HIGH (ref 15–41)
Albumin: 3.5 g/dL (ref 3.5–5.0)
Alkaline Phosphatase: 93 U/L (ref 38–126)
Anion gap: 8 (ref 5–15)
BUN: 17 mg/dL (ref 6–20)
CO2: 23 mmol/L (ref 22–32)
Calcium: 8.6 mg/dL — ABNORMAL LOW (ref 8.9–10.3)
Chloride: 109 mmol/L (ref 98–111)
Creatinine, Ser: 0.8 mg/dL (ref 0.44–1.00)
GFR calc Af Amer: >60 mL/min (ref >60)
GFR calc non Af Amer: >60 mL/min (ref >60)
Glucose, Bld: 195 mg/dL — ABNORMAL HIGH (ref 70–99)
Potassium: 3.8 mmol/L (ref 3.5–5.1)
Sodium: 140 mmol/L (ref 135–145)
Total Bilirubin: 0.5 mg/dL (ref 0.3–1.2)
Total Protein: 6.6 g/dL (ref 6.5–8.1)

## 2018-07-01 LAB — POC URINE PREG, ED: Preg Test, Ur: NEGATIVE

## 2018-07-01 LAB — LIPASE, BLOOD: Lipase: 32 U/L (ref 11–51)

## 2018-07-01 MED ORDER — TAMSULOSIN HCL 0.4 MG PO CAPS: 0.4000 mg | ORAL_CAPSULE | Freq: Every day | ORAL | 0 refills | Status: DC

## 2018-07-01 MED ORDER — HYDROCODONE-ACETAMINOPHEN 5-325 MG PO TABS: 1.0000 | ORAL_TABLET | Freq: Four times a day (QID) | ORAL | 0 refills | Status: DC | PRN

## 2018-07-01 MED ORDER — NAPROXEN 500 MG PO TABS: 500.0000 mg | ORAL_TABLET | Freq: Two times a day (BID) | ORAL | 0 refills | Status: DC | PRN

## 2018-07-01 MED ORDER — ONDANSETRON 4 MG PO TBDP: 4.0000 mg | ORAL_TABLET | Freq: Three times a day (TID) | ORAL | 0 refills | Status: DC | PRN

## 2018-07-01 MED ORDER — MORPHINE SULFATE (PF) 4 MG/ML IV SOLN
4.0000 mg | Freq: Once | INTRAVENOUS | Status: AC
  Administered 2018-07-01: 4 mg via INTRAVENOUS
  Filled 2018-07-01: qty 1

## 2018-07-01 NOTE — Discharge Instructions (Addendum)
Take naprosyn as directed as needed for pain using norco for breakthrough pain. Do not drive or operate machinery with pain medication use. May need over-the-counter stool softener with this pain medication use. Use Zofran as needed for nausea. Use Flomax as directed, as this medication will help you pass the stone. Strain all urine to try to catch the stone when it passes. Follow-up with your urologist in the next 1 to 2 weeks for recheck of ongoing pain, however for intractable or uncontrollable symptoms at home then return to the [facility] emergency department.

## 2018-07-01 NOTE — ED Notes (Signed)
Bed: AF58 Expected date:  Expected time:  Means of arrival:  Comments: EMS-kidney stones

## 2018-07-01 NOTE — ED Provider Notes (Signed)
[facility] DEPT Provider Note   CSN: [id number] Arrival date & time: 07/01/18  1920    History   Chief Complaint Chief Complaint  Patient presents with  . Flank Pain    HPI    Abigail [name] is a 53 y.o. female with a PMHx of kidney stones, bulging lumbar disc, celiac disease, diverticulosis, fatty liver, GERD, HTN, and other conditions listed below, and PSHx of tubal ligation, who presents to the ED with complaints of right-sided abdominal pain that began at 5 PM.  Patient states that for about a month she has had some right flank and RUQ pain/discomfort, but suddenly at 5 PM she began having significant right-sided abdominal pain.  She describes the pain as 10/10 stabbing and pressure in the RUQ and RLQ as well as the suprapubic area, radiating into the groin/vaginal area, with no known aggravating factors, and improved after hydrocodone, Zofran, and fentanyl.  She reports associated nausea.  She also reports having some discomfort in her suprapubic area/vaginal area for the last few days as well as increased urinary urgency and frequency. She states that her symptoms feel somewhat like prior kidney stones.  She denies any fevers, chills, chest pain, shortness of breath, cough, vomiting, diarrhea, constipation, obstipation, melena, hematochezia, dysuria, hematuria, malodorous urine, vaginal bleeding or discharge, numbness, tingling, focal weakness, or any other complaints at this time.  She denies any recent travel, sick contacts, suspicious food intake, alcohol use, or NSAID use.  Of note, she had a RUQ U/S in 12/2017 which showed hepatic steatosis but otherwise unremarkable.   Sees Dr. Junious Silk for urology care.   The history is provided by the patient and medical records. No language interpreter was used.    Past Medical History:  Diagnosis Date  . Anxiety   . Bulging lumbar disc   . CAP (community acquired pneumonia)   . Cardiac arrhythmia due to  congenital heart disease    no arrhythmia identified   . Celiac disease   . Depression   . Diverticulosis    Sigmoid colon  . Fatty liver 05/2016  . Frequent headaches   . GERD (gastroesophageal reflux disease)   . Heart disease   . History of kidney stones   . Hypertension   . Kidney stones   . Migraine   . Obesity (BMI 35.0-39.9 without comorbidity)   . UTI (urinary tract infection)     Patient Active Problem List   Diagnosis Date Noted  . CAP (community acquired pneumonia) 12/30/2017  . Hyperlipidemia due to dietary fat intake 08/05/2017  . PVC's (premature ventricular contractions) 06/23/2017  . Shortness of breath 06/23/2017  . Hypothyroidism 09/17/2016  . Allergic rhinitis 06/06/2015  . Palpitations 04/11/2015  . Neck pain 06/29/2014  . Chronic back pain 06/29/2014  . Esophageal reflux 06/29/2014  . Obesity (BMI 30.0-34.9) 05/23/2013  . Normal coronary arteries- 2012 05/23/2013  . Suspected sleep apnea 05/23/2013  . Anxiety and depression 05/23/2013  . History of medication noncompliance 05/23/2013    Past Surgical History:  Procedure Laterality Date  . ABDOMINAL EXPLORATION SURGERY  1986  . COLONOSCOPY  06/2016  . EXTRACORPOREAL SHOCK WAVE LITHOTRIPSY Left 12/03/2016   Procedure: LEFT EXTRACORPOREAL SHOCK WAVE LITHOTRIPSY (ESWL);  Surgeon: Irine Seal, MD;  Location: [facility];  Service: Urology;  Laterality: Left;  . KIDNEY STONE SURGERY    . LEFT HEART CATH AND CORONARY ANGIOGRAPHY  07/28/2016    Large tortuous coronary arteries no radiographic evidence of disease.  Not  anginal chest pain.  Normal EF.  [name] LITHOTRIPSY    . NASAL SINUS SURGERY    . ROTATOR CUFF REPAIR Right 12/27/2017  . TRANSTHORACIC ECHOCARDIOGRAM  06/2017   EF 60-65%.  GR 1-2 DD.  Otherwise normal echo.  Normal valve function.  Normal wall motion.  . TUBAL LIGATION       OB History   No obstetric history on file.      Home Medications    Prior to Admission medications   Medication  Sig Start Date End Date Taking? Authorizing Provider  albuterol (PROVENTIL HFA;VENTOLIN HFA) 108 (90 Base) MCG/ACT inhaler Inhale 1-2 puffs into the lungs every 6 (six) hours as needed for wheezing or shortness of breath (cough). Patient taking differently: Inhale 1-2 puffs into the lungs every 6 (six) hours as needed (coughing, shortness of breath, or wheezing).  11/15/17   [name], NP  aspirin 81 MG chewable tablet Chew 81 mg by mouth daily.    [provider]  benzonatate (TESSALON) 200 MG capsule Take 1 capsule (200 mg total) by mouth 3 (three) times daily as needed. Patient taking differently: Take 200 mg by mouth 3 (three) times daily as needed for cough.  11/15/17   [name], NP  Chlorpheniramine-PSE-Ibuprofen 2-30-200 MG TABS Take 1-2 tablets by mouth every 6 (six) hours as needed (for cold symptoms).    [provider]  esomeprazole (NEXIUM) 40 MG capsule TAKE 1 CAPSULE BY MOUTH DAILY Patient taking differently: Take 40 mg by mouth daily before breakfast.  02/24/16   [name], NP  FLUoxetine (PROZAC) 40 MG capsule Take 1 capsule (40 mg total) by mouth daily. 11/15/17   [name], NP  HYDROcodone-acetaminophen (NORCO/VICODIN) 5-325 MG tablet Take 1 tablet by mouth every 6 (six) hours as needed. for pain 02/15/18   [provider]  ibuprofen (ADVIL,MOTRIN) 200 MG tablet Take 400 mg by mouth every 6 (six) hours as needed for pain or headache.    [provider]  levothyroxine (SYNTHROID, LEVOTHROID) 50 MCG tablet Take 1 tablet by mouth every morning on an empty stomach with water. No food or other medications for 30 min. Patient taking differently: Take 50 mcg by mouth daily before breakfast. No food or other medications for 30 min. 11/15/17   [name], NP  LINZESS 72 MCG capsule TAKE 1 CAPSULE BY MOUTH ONCE DAILY BEFORE BREAKFAST Patient taking differently: Take 72 mcg by mouth daily as needed (for constipation).   12/20/17   Mauri Pole, MD  meloxicam (MOBIC) 15 MG tablet Take 15 mg by mouth every morning. 06/04/16   [provider]  methocarbamol (ROBAXIN) 500 MG tablet Take 500 mg by mouth 3 (three) times daily.     [provider]  metoprolol tartrate (LOPRESSOR) 25 MG tablet Take 1 tablet (25 mg total) by mouth 2 (two) times daily. 06/23/17 12/29/17  Leonie Man, MD  ondansetron (ZOFRAN ODT) 4 MG disintegrating tablet Take 1 tablet (4 mg total) by mouth every 8 (eight) hours as needed for nausea or vomiting. 05/26/18   [name], NP  oxybutynin (DITROPAN-XL) 5 MG 24 hr tablet Take 5 mg by mouth daily. 12/03/17   [provider]  rosuvastatin (CRESTOR) 20 MG tablet Take 1 tablet (20 mg total) by mouth daily. 03/08/18 06/06/18  Leonie Man, MD  traMADol (ULTRAM) 50 MG tablet Take 50 mg by mouth every morning.  08/12/16   [provider]  Family History Family History  Problem Relation Age of Onset  . COPD Father   . Emphysema Father   . Gallstones Mother   . Colon cancer Neg Hx   . Stomach cancer Neg Hx   . Esophageal cancer Neg Hx     Social History Social History   Tobacco Use  . Smoking status: Former Smoker    Types: Cigarettes    Last attempt to quit: 2008    Years since quitting: 12.3  . Smokeless tobacco: Never Used  . Tobacco comment: used tobacco "socially"  Substance Use Topics  . Alcohol use: No    Alcohol/week: 0.0 standard drinks  . Drug use: No     Allergies   Penicillins and Latex   Review of Systems Review of Systems  Constitutional: Negative for chills and fever.  Respiratory: Negative for cough and shortness of breath.   Cardiovascular: Negative for chest pain.  Gastrointestinal: Positive for abdominal pain and nausea. Negative for blood in stool, constipation, diarrhea and vomiting.  Genitourinary: Positive for flank pain, frequency and urgency. Negative for dysuria, hematuria, vaginal bleeding and  vaginal discharge.  Musculoskeletal: Negative for arthralgias and myalgias.  Skin: Negative for color change.  Allergic/Immunologic: Negative for immunocompromised state.  Neurological: Negative for weakness and numbness.  Psychiatric/Behavioral: Negative for confusion.   All other systems reviewed and are negative for acute change except as noted in the HPI.    Physical Exam Updated Vital Signs BP 118/66 (BP Location: Left Arm)   Pulse 86   Temp 98.4 F (36.9 C) (Oral)   Resp 18   LMP 08/12/2015 Comment: patient states hx. tubal ligation  SpO2 96%   Physical Exam Vitals signs and nursing note reviewed.  Constitutional:      General: She is not in acute distress.    Appearance: Normal appearance. She is well-developed. She is not toxic-appearing.     Comments: Afebrile, nontoxic, NAD  HENT:     Head: Normocephalic and atraumatic.  Eyes:     General:        Right eye: No discharge.        Left eye: No discharge.     Conjunctiva/sclera: Conjunctivae normal.  Neck:     Musculoskeletal: Normal range of motion and neck supple.  Cardiovascular:     Rate and Rhythm: Normal rate and regular rhythm.     Pulses: Normal pulses.     Heart sounds: Normal heart sounds, S1 normal and S2 normal. No murmur. No friction rub. No gallop.   Pulmonary:     Effort: Pulmonary effort is normal. No respiratory distress.     Breath sounds: Normal breath sounds. No decreased breath sounds, wheezing, rhonchi or rales.  Abdominal:     General: Bowel sounds are normal. There is no distension.     Palpations: Abdomen is soft. Abdomen is not rigid.     Tenderness: There is abdominal tenderness in the right lower quadrant and suprapubic area. There is no right CVA tenderness, left CVA tenderness, guarding or rebound. Negative signs include Murphy's sign and McBurney's sign.       Comments: Soft, obese but nondistended, +BS throughout, with mild RLQ and suprapubic TTP tracking towards the R flank area,  no discrete RUQ TTP, no r/g/r, neg murphy's, no focal mcburney's point tenderness, no CVA TTP   Musculoskeletal: Normal range of motion.  Skin:    General: Skin is warm and dry.     Findings: No rash.  Neurological:  Mental Status: She is alert and oriented to person, place, and time.     Sensory: Sensation is intact. No sensory deficit.     Motor: Motor function is intact.  Psychiatric:        Mood and Affect: Mood and affect normal.        Behavior: Behavior normal.      ED Treatments / Results  Labs (all labs ordered are listed, but only abnormal results are displayed) Labs Reviewed  CBC WITH DIFFERENTIAL/PLATELET - Abnormal; Notable for the following components:      Result Value   WBC 10.6 (*)    Neutro Abs 8.4 (*)    All other components within normal limits  COMPREHENSIVE METABOLIC PANEL - Abnormal; Notable for the following components:   Glucose, Bld 195 (*)    Calcium 8.6 (*)    AST 74 (*)    ALT 72 (*)    All other components within normal limits  URINALYSIS, ROUTINE W REFLEX MICROSCOPIC - Abnormal; Notable for the following components:   Hgb urine dipstick MODERATE (*)    Ketones, ur 5 (*)    Leukocytes,Ua TRACE (*)    RBC / HPF >50 (*)    Bacteria, UA RARE (*)    All other components within normal limits  LIPASE, BLOOD  POC URINE PREG, ED    EKG None  Radiology Ct Renal Stone Study  Result Date: 07/01/2018 CLINICAL DATA:  Right flank pain, right lower quadrant pain EXAM: CT ABDOMEN AND PELVIS WITHOUT CONTRAST TECHNIQUE: Multidetector CT imaging of the abdomen and pelvis was performed following the standard protocol without IV contrast. COMPARISON:  09/13/2017 FINDINGS: Lower chest: Lung bases are clear. No effusions. Heart is normal size. Hepatobiliary: Diffuse low-density throughout the liver compatible with fatty infiltration. No focal abnormality. Gallbladder unremarkable. Pancreas: No focal abnormality or ductal dilatation. Spleen: No focal  abnormality.  Normal size. Adrenals/Urinary Tract: Punctate 1-2 mm stone at the right ureterovesical junction. There is mild fullness of the right renal collecting system and ureter. Punctate nonobstructing stones in the kidneys bilaterally. Adrenal glands and urinary bladder unremarkable. Stomach/Bowel: Normal appendix. Stomach, large and small bowel grossly unremarkable. Vascular/Lymphatic: No evidence of aneurysm or adenopathy. Reproductive: Uterus and adnexa unremarkable.  No mass. Other: No free fluid or free air. Musculoskeletal: No acute bony abnormality. IMPRESSION: Punctate 1-2 mm right UVJ stone. Mild fullness of the right renal collecting system and ureter. Punctate bilateral nephrolithiasis. Fatty liver. Electronically Signed   By: Rolm Baptise M.D.   On: 07/01/2018 21:08     RUQ U/S 12/31/17: Study Result  CLINICAL DATA:  Abdominal pain.  EXAM: ULTRASOUND ABDOMEN LIMITED RIGHT UPPER QUADRANT  COMPARISON:  CT scan of the abdomen dated 09/13/2017 and abdominal ultrasound dated 04/02/2016  FINDINGS: Gallbladder:  No gallstones or wall thickening visualized. No sonographic Murphy sign noted by sonographer.  Common bile duct:  Diameter: 3.9 mm, normal.  Liver:  No focal lesion identified. Increased echogenicity of the liver parenchyma diffusely consistent with hepatic steatosis. The appearance is unchanged since the prior ultrasound of 2018. Portal vein is patent on color Doppler imaging with normal direction of blood flow towards the liver.  IMPRESSION: No acute abnormalities.  Chronic hepatic steatosis.   Electronically Signed   By: Lorriane Shire M.D.   On: 12/31/2017 13:05     Procedures Procedures (including critical care time)  Medications Ordered in ED Medications  morphine 4 MG/ML injection 4 mg (4 mg Intravenous Given 07/01/18 2003)  Initial Impression / Assessment and Plan / ED Course  I have reviewed the triage vital signs and the  nursing notes.  Pertinent labs & imaging results that were available during my care of the patient were reviewed by me and considered in my medical decision making (see chart for details).        53 y.o. female here with right-sided abdominal pain that began at 5 PM.  Patient states that she has had some right flank/RUQ pain for about a month, but nothing this severe.  Suddenly at 5 PM she started having right-sided abdominal pain radiating into the suprapubic area and vaginal area, feels somewhat like a kidney stone.  Felt nauseated but did not vomit.  Has had some increased urinary frequency and urgency for a few days.  On exam, mild RLQ TTP tracking towards the right flank area, no RUQ TTP and negative Murphy sign.  Non-peritoneal.  No CVA tenderness. DDx includes kidney stone vs appendicitis, etc. Less likely cholecystitis. Will give pain meds, get labs and CT renal study, and reassess shortly.   9:53 PM CBC w/diff with mildly elevated WBC 10.6 with slight neutrophilic predominance. CMP with gluc 195, AST 74, ALT 72 which is similar to prior visits. Lipase WNL. U/A without evidence of UTI. Upreg neg. CT renal showing 1-50m punctate R UVJ stone with mild fullness of R renal collecting system and ureter with punctate b/l nephrolithiasis and fatty liver. Pt feeling better, tolerating PO well. Will send home with zofran, naprosyn, norco, and flomax. Urine strainer given. Advised staying hydrated. F/up with urology in 1-2wks but strict return precautions advised. I explained the diagnosis and have given explicit precautions to return to the ER including for any other new or worsening symptoms. The patient understands and accepts the medical plan as it's been dictated and I have answered their questions. Discharge instructions concerning home care and prescriptions have been given. The patient is STABLE and is discharged to home in good condition.    Final Clinical Impressions(s) / ED Diagnoses    Final diagnoses:  Right flank pain  Right sided abdominal pain  Ureterolithiasis  Nausea    ED Discharge Orders         Ordered    naproxen (NAPROSYN) 500 MG tablet  2 times daily PRN     07/01/18 2152    HYDROcodone-acetaminophen (NORCO) 5-325 MG tablet  Every 6 hours PRN     07/01/18 2152    tamsulosin (FLOMAX) 0.4 MG CAPS capsule  Daily after supper     07/01/18 2152    ondansetron (ZOFRAN ODT) 4 MG disintegrating tablet  Every 8 hours PRN     07/01/18 [address] [place]/24/20 2153    BHayden Rasmussen MD 07/02/18 1011

## 2018-07-01 NOTE — ED Notes (Signed)
Patient transported to CT 

## 2018-07-01 NOTE — ED Notes (Signed)
Patient given water

## 2018-07-01 NOTE — ED Triage Notes (Signed)
Per EMS, patient from home, c/o right flank pain since 1700. Hx kidney stones. Reports nausea. Denies vomiting.   20g L FA 54m Zofran 1024m Fentanyl

## 2018-07-01 NOTE — ED Notes (Signed)
ED Provider at bedside. 

## 2018-07-02 ENCOUNTER — Other Ambulatory Visit: Payer: Self-pay | Admitting: Cardiology

## 2018-07-04 NOTE — Telephone Encounter (Signed)
Lopressor 25 mg refilled

## 2018-07-11 DIAGNOSIS — N201 Calculus of ureter: Secondary | ICD-10-CM | POA: Diagnosis not present

## 2018-07-18 ENCOUNTER — Telehealth: Payer: Self-pay | Admitting: Gastroenterology

## 2018-07-18 NOTE — Telephone Encounter (Signed)
The pt is complaining of BRBPR for the past year off/on about every other week for 1-2 days at a time. Went to the ED 2 weeks ago with kidney stone and was asked if she had rectal bleeding.  She advised she does occasionally and the ED doc told her it was a hemorrhoid.  Has a history of hemorrhoids.  Does strain at times to have a BM.  Telehealth visit scheduled for 5/15 and pt advised to keep bowels soft and try prep H. The pt has been advised of the information and verbalized understanding.

## 2018-07-21 DIAGNOSIS — M5106 Intervertebral disc disorders with myelopathy, lumbar region: Secondary | ICD-10-CM | POA: Diagnosis not present

## 2018-07-21 DIAGNOSIS — Q762 Congenital spondylolisthesis: Secondary | ICD-10-CM | POA: Diagnosis not present

## 2018-07-21 DIAGNOSIS — M545 Low back pain: Secondary | ICD-10-CM | POA: Diagnosis not present

## 2018-07-21 DIAGNOSIS — G894 Chronic pain syndrome: Secondary | ICD-10-CM | POA: Diagnosis not present

## 2018-07-22 ENCOUNTER — Ambulatory Visit (INDEPENDENT_AMBULATORY_CARE_PROVIDER_SITE_OTHER): Payer: 59 | Admitting: Gastroenterology

## 2018-07-22 ENCOUNTER — Other Ambulatory Visit: Payer: Self-pay

## 2018-07-22 ENCOUNTER — Encounter: Payer: Self-pay | Admitting: Gastroenterology

## 2018-07-22 VITALS — Ht 62.0 in | Wt 206.0 lb

## 2018-07-22 DIAGNOSIS — K449 Diaphragmatic hernia without obstruction or gangrene: Secondary | ICD-10-CM | POA: Diagnosis not present

## 2018-07-22 DIAGNOSIS — R140 Abdominal distension (gaseous): Secondary | ICD-10-CM

## 2018-07-22 DIAGNOSIS — K219 Gastro-esophageal reflux disease without esophagitis: Secondary | ICD-10-CM

## 2018-07-22 DIAGNOSIS — K582 Mixed irritable bowel syndrome: Secondary | ICD-10-CM | POA: Diagnosis not present

## 2018-07-22 DIAGNOSIS — K625 Hemorrhage of anus and rectum: Secondary | ICD-10-CM

## 2018-07-22 DIAGNOSIS — K641 Second degree hemorrhoids: Secondary | ICD-10-CM

## 2018-07-22 MED ORDER — LINACLOTIDE 72 MCG PO CAPS: ORAL_CAPSULE | ORAL | 3 refills | Status: DC

## 2018-07-22 NOTE — Progress Notes (Signed)
Abigail [name]    [id number]    [birth year]  Primary Care Physician:Clark, Leticia Penna, NP  Referring Physician: [name], NP [address] [place], [place] [address]  This service was provided via audio and video telemedicine (Doximity) due to Heber 19 pandemic.  Patient location: Home Provider location: Office Used 2 patient identifiers to confirm the correct person. Explained the limitations in evaluation and management via telemedicine. Patient is aware of potential medical charges for this visit.  Patient consented to this virtual visit.  The persons participating in this telemedicine service were myself and the patient  Interactive audio and video telecommunications were attempted between this provider and patient, however failed, due to patient having technical difficulties OR patient did not have access to video capability. We continued and completed visit with audio only.    Chief complaint: Bright red blood per rectum, alternating diarrhea and constipation HPI: 53 year old female with history of irritable bowel syndrome with alternating constipation diarrhea. Stool consistency varies from hard to loose stool Intermittent rectal bleeding.   She is taking Colace 2 capsules at bedtime daily. She takes Linzess only as needed, doesn't take it everyday. Feels bloated all the time  [place] and epigastric discomfort under control with Nexium daily, has intermittent regurgitation and acid taste.  Colonoscopy June 30, 2016 with removal of 3 mm sessile serrated adenomatous polyp from cecum, sigmoid diverticulosis and internal hemorrhoids. EGD June 30, 2016: Small hiatal hernia otherwise normal exam.  Duodenal biopsies showed benign mucosa negative for celiac.   Outpatient Encounter Medications as of 07/22/2018  Medication Sig  . albuterol (PROVENTIL HFA;VENTOLIN HFA) 108 (90 Base) MCG/ACT inhaler Inhale 1-2 puffs into the lungs every 6 (six) hours  as needed for wheezing or shortness of breath (cough). (Patient taking differently: Inhale 1-2 puffs into the lungs every 6 (six) hours as needed (coughing, shortness of breath, or wheezing). )  . esomeprazole (NEXIUM) 40 MG capsule TAKE 1 CAPSULE BY MOUTH DAILY (Patient taking differently: Take 40 mg by mouth daily before breakfast. )  . FLUoxetine (PROZAC) 40 MG capsule Take 1 capsule (40 mg total) by mouth daily.  [name] HYDROcodone-acetaminophen (NORCO) 5-325 MG tablet Take 1 tablet by mouth every 6 (six) hours as needed for severe pain.  [name] levothyroxine (SYNTHROID, LEVOTHROID) 50 MCG tablet Take 1 tablet by mouth every morning on an empty stomach with water. No food or other medications for 30 min. (Patient taking differently: Take 50 mcg by mouth daily before breakfast. No food or other medications for 30 min.)  . LINZESS 72 MCG capsule TAKE 1 CAPSULE BY MOUTH ONCE DAILY BEFORE BREAKFAST (Patient taking differently: Take 72 mcg by mouth daily as needed (for constipation). )  . meloxicam (MOBIC) 15 MG tablet Take 15 mg by mouth every morning.  . methocarbamol (ROBAXIN) 500 MG tablet Take 500 mg by mouth 3 (three) times daily.   . metoprolol tartrate (LOPRESSOR) 25 MG tablet Take 1 tablet by mouth twice daily  . ondansetron (ZOFRAN ODT) 4 MG disintegrating tablet Take 1 tablet (4 mg total) by mouth every 8 (eight) hours as needed for nausea or vomiting.  . ondansetron (ZOFRAN ODT) 4 MG disintegrating tablet Take 1 tablet (4 mg total) by mouth every 8 (eight) hours as needed for nausea or vomiting.  [name] oxybutynin (DITROPAN-XL) 5 MG 24 hr tablet Take 5 mg by mouth daily.  [name] Peppermint Oil (IBGARD PO) Take by mouth.  [name]  rosuvastatin (CRESTOR) 20 MG tablet Take 20 mg by mouth daily.  . traMADol (ULTRAM) 50 MG tablet Take 50 mg by mouth every morning.    No facility-administered encounter medications on file as of 07/22/2018.     Allergies as of 07/22/2018 - Review Complete 07/22/2018  Allergen Reaction  Noted  . Penicillins Nausea Only 12/29/2017  . Latex Rash 11/27/2016    Past Medical History:  Diagnosis Date  . Anxiety   . Bulging lumbar disc   . CAP (community acquired pneumonia)   . Cardiac arrhythmia due to congenital heart disease    no arrhythmia identified   . Celiac disease   . Depression   . Diverticulosis    Sigmoid colon  . Fatty liver 05/2016  . Frequent headaches   . GERD (gastroesophageal reflux disease)   . Heart disease   . History of kidney stones   . Hypertension   . Kidney stones   . Migraine   . Obesity (BMI 35.0-39.9 without comorbidity)   . UTI (urinary tract infection)     Past Surgical History:  Procedure Laterality Date  . ABDOMINAL EXPLORATION SURGERY  1986  . COLONOSCOPY  06/2016  . EXTRACORPOREAL SHOCK WAVE LITHOTRIPSY Left 12/03/2016   Procedure: LEFT EXTRACORPOREAL SHOCK WAVE LITHOTRIPSY (ESWL);  Surgeon: Irine Seal, MD;  Location: [facility];  Service: Urology;  Laterality: Left;  . KIDNEY STONE SURGERY    . LEFT HEART CATH AND CORONARY ANGIOGRAPHY  07/28/2016    Large tortuous coronary arteries no radiographic evidence of disease.  Not anginal chest pain.  Normal EF.  [name] LITHOTRIPSY    . NASAL SINUS SURGERY    . ROTATOR CUFF REPAIR Right 12/27/2017  . TRANSTHORACIC ECHOCARDIOGRAM  06/2017   EF 60-65%.  GR 1-2 DD.  Otherwise normal echo.  Normal valve function.  Normal wall motion.  . TUBAL LIGATION      Family History  Problem Relation Age of Onset  . COPD Father   . Emphysema Father   . Gallstones Mother   . Colon cancer Neg Hx   . Stomach cancer Neg Hx   . Esophageal cancer Neg Hx     Social History   Socioeconomic History  . Marital status: Single    Spouse name: Not on file  . Number of children: Not on file  . Years of education: Not on file  . Highest education level: Not on file  Occupational History  . Not on file  Social Needs  . Financial resource strain: Not on file  . Food insecurity:    Worry: Not on file     Inability: Not on file  . Transportation needs:    Medical: Not on file    Non-medical: Not on file  Tobacco Use  . Smoking status: Former Smoker    Types: Cigarettes    Last attempt to quit: 2008    Years since quitting: 12.3  . Smokeless tobacco: Never Used  . Tobacco comment: used tobacco "socially"  Substance and Sexual Activity  . Alcohol use: No    Alcohol/week: 0.0 standard drinks  . Drug use: No  . Sexual activity: Not on file  Lifestyle  . Physical activity:    Days per week: Not on file    Minutes per session: Not on file  . Stress: Not on file  Relationships  . Social connections:    Talks on phone: Not on file    Gets together: Not on file    Attends  religious service: Not on file    Active member of club or organization: Not on file    Attends meetings of clubs or organizations: Not on file    Relationship status: Not on file  . Intimate partner violence:    Fear of current or ex partner: Not on file    Emotionally abused: Not on file    Physically abused: Not on file    Forced sexual activity: Not on file  Other Topics Concern  . Not on file  Social History Narrative   Single.   Has a set of Twins, age 72.   Works for the [employer] and [employer] and [employer]   Enjoys relaxing, spending time with her children.      Review of systems: Review of Systems as per HPI All other systems reviewed and are negative.   Physical Exam: Vitals were not taken and physical exam was not performed during this virtual visit.  Data Reviewed:  Reviewed labs, radiology imaging, old records and pertinent past GI work up   Assessment and Plan/Recommendations:  54 yr F with IBS constipation, diarrhea, abdominal bloating, intermittent BRBPR secondary to hemorrhoids, hiatal hernia and GERD  Start taking Linzess 63mg daily, please send 90-day supply with refill for a year Increase dietary fiber and fluid intake 8-10 cups water daily Benefiber 1 teaspoon  three times daily  Patient had suppository at bedtime as needed for symptomatic hemorrhoids  If continues to have persistent small-volume bright red blood per rectum will consider hemorrhoidal band ligation  Continue Nexium daily Antireflux measures  Follow-up telemedicine visit in 4 to 6 weeks or sooner if needed    K. VDenzil Magnuson, MD   CC: CPleas Koch NP

## 2018-07-22 NOTE — Patient Instructions (Addendum)
Start taking Linzess 19mg daily, please send 90-day supply with refill for a year Increase dietary fiber and fluid intake 8-10 cups water daily Benefiber 1 teaspoon three times daily  Patient had suppository at bedtime as needed for symptomatic hemorrhoids  If continues to have persistent small-volume bright red blood per rectum will consider hemorrhoidal band ligation  Continue Nexium daily Antireflux measures  Follow-up telemedicine visit in 4 to 6 weeks or sooner if needed- We will contact you to schedule your follow- up appointment when our June schedule is available.     We have sent the following medications to your pharmacy for you to pick up at your convenience: Linzess   Thank you for choosing me and [facility].  Dr. NSilverio Decamp

## 2018-07-26 DIAGNOSIS — M5136 Other intervertebral disc degeneration, lumbar region: Secondary | ICD-10-CM | POA: Diagnosis not present

## 2018-09-21 LAB — HM MAMMOGRAPHY

## 2018-09-22 ENCOUNTER — Encounter: Payer: Self-pay | Admitting: Primary Care

## 2018-10-11 ENCOUNTER — Ambulatory Visit (INDEPENDENT_AMBULATORY_CARE_PROVIDER_SITE_OTHER): Payer: 59 | Admitting: Family Medicine

## 2018-10-11 ENCOUNTER — Encounter: Payer: Self-pay | Admitting: Family Medicine

## 2018-10-11 VITALS — Temp 98.1°F | Ht 62.0 in | Wt 204.0 lb

## 2018-10-11 DIAGNOSIS — R51 Headache: Secondary | ICD-10-CM | POA: Diagnosis not present

## 2018-10-11 DIAGNOSIS — R519 Headache, unspecified: Secondary | ICD-10-CM

## 2018-10-11 DIAGNOSIS — R6883 Chills (without fever): Secondary | ICD-10-CM

## 2018-10-11 DIAGNOSIS — J019 Acute sinusitis, unspecified: Secondary | ICD-10-CM | POA: Insufficient documentation

## 2018-10-11 DIAGNOSIS — J329 Chronic sinusitis, unspecified: Secondary | ICD-10-CM | POA: Insufficient documentation

## 2018-10-11 MED ORDER — BENZONATATE 100 MG PO CAPS: 100.0000 mg | ORAL_CAPSULE | Freq: Three times a day (TID) | ORAL | 0 refills | Status: DC | PRN

## 2018-10-11 MED ORDER — ONDANSETRON 4 MG PO TBDP: 4.0000 mg | ORAL_TABLET | Freq: Three times a day (TID) | ORAL | 0 refills | Status: DC | PRN

## 2018-10-11 NOTE — Progress Notes (Signed)
Virtual visit completed through D[employer]Me. Due to national recommendations of social distancing due to COVID-19, a virtual visit is felt to be most appropriate for this patient at this time. Reviewed limitations of a virtual visit.   Patient location: home Provider location: [place] at [facility], office If any vitals were documented, they were collected by patient at home unless specified below.    Temp 98.1 F (36.7 C)   Ht 5' 2"  (1.575 m)   Wt 204 lb (92.5 kg)   LMP 08/12/2015 Comment: patient states hx. tubal ligation  BMI 37.31 kg/m    CC: HA Subjective:    Patient ID: Abigail [name], female    DOB: December 13, 1965, 53 y.o.   MRN: [id number]  HPI: Abigail [name] is a 53 y.o. female presenting on 10/11/2018 for Headache (C/o HA, nausea, chills, cough, runny nose and body aches.  Sxs started about 1 mo ago. )   1 mo h/o HA (frontal sinuses into neck), nasal congestion associated green mucous. Over last 24 hours, noted acute worsening nausea, chills and body aches. Some R ribcage pain. "Flu like symptoms". Mild cough productive of some mucous. Decreased appetite. Mildly loose stools.   Treating at home with advil sinus/congestion, advil allergy, nasacort and saline irrigation.   No noted fever, loss of taste/smell or dyspnea. She works for [employer] exposed to [employer]. She has been using masks and gloves.  + Covid exposure 10 d ago. Several positives at work as well.      Relevant past medical, surgical, family and social history reviewed and updated as indicated. Interim medical history since our last visit reviewed. Allergies and medications reviewed and updated. Outpatient Medications Prior to Visit  Medication Sig Dispense Refill  . albuterol (PROVENTIL HFA;VENTOLIN HFA) 108 (90 Base) MCG/ACT inhaler Inhale 1-2 puffs into the lungs every 6 (six) hours as needed for wheezing or shortness of breath (cough). (Patient taking differently: Inhale 1-2 puffs into the  lungs every 6 (six) hours as needed (coughing, shortness of breath, or wheezing). ) 1 Inhaler 0  . esomeprazole (NEXIUM) 40 MG capsule TAKE 1 CAPSULE BY MOUTH DAILY (Patient taking differently: Take 40 mg by mouth daily before breakfast. ) 90 capsule 1  . FLUoxetine (PROZAC) 40 MG capsule Take 1 capsule (40 mg total) by mouth daily. 90 capsule 3  . HYDROcodone-acetaminophen (NORCO) 5-325 MG tablet Take 1 tablet by mouth every 6 (six) hours as needed for severe pain. 10 tablet 0  . levothyroxine (SYNTHROID, LEVOTHROID) 50 MCG tablet Take 1 tablet by mouth every morning on an empty stomach with water. No food or other medications for 30 min. (Patient taking differently: Take 50 mcg by mouth daily before breakfast. No food or other medications for 30 min.) 90 tablet 3  . linaclotide (LINZESS) 72 MCG capsule TAKE 1 CAPSULE BY MOUTH ONCE DAILY BEFORE BREAKFAST 90 capsule 3  . methocarbamol (ROBAXIN) 500 MG tablet Take 500 mg by mouth 3 (three) times daily.     . metoprolol tartrate (LOPRESSOR) 25 MG tablet Take 1 tablet by mouth twice daily 60 tablet 5  . oxybutynin (DITROPAN-XL) 5 MG 24 hr tablet Take 5 mg by mouth daily.  1  . Peppermint Oil (IBGARD PO) Take by mouth.    . rosuvastatin (CRESTOR) 20 MG tablet Take 20 mg by mouth daily.    . traMADol (ULTRAM) 50 MG tablet Take 50 mg by mouth every morning.     . ondansetron (ZOFRAN ODT) 4  MG disintegrating tablet Take 1 tablet (4 mg total) by mouth every 8 (eight) hours as needed for nausea or vomiting. 10 tablet 0  . ondansetron (ZOFRAN ODT) 4 MG disintegrating tablet Take 1 tablet (4 mg total) by mouth every 8 (eight) hours as needed for nausea or vomiting. 15 tablet 0  . meloxicam (MOBIC) 15 MG tablet Take 15 mg by mouth every morning.     No facility-administered medications prior to visit.      Per HPI unless specifically indicated in ROS section below Review of Systems Objective:    Temp 98.1 F (36.7 C)   Ht 5' 2"  (1.575 m)   Wt 204 lb  (92.5 kg)   LMP 08/12/2015 Comment: patient states hx. tubal ligation  BMI 37.31 kg/m   Wt Readings from Last 3 Encounters:  10/11/18 204 lb (92.5 kg)  07/22/18 206 lb (93.4 kg)  07/21/18 206 lb (93.4 kg)     Physical exam: Gen: alert, NAD, tired appearing sitting in recliner with blanket and neck pillow Pulm: speaks in complete sentences without increased work of breathing Psych: normal mood, normal thought content      Results for orders placed or performed in visit on 09/22/18  HM MAMMOGRAPHY  Result Value Ref Range   HM Mammogram 0-4 Bi-Rad 0-4 Bi-Rad, Self Reported Normal   Assessment & Plan:  Requests note for work.  Problem List Items Addressed This Visit    Nonintractable headache - Primary    Acute worsening headache, body aches, chills, nausea in setting of recent Covid exposure - will send for testing and recommend she stay out of work until feeling better. She will go tomorrow to be tested. Letter written for work to be mailed to patient. F/u based on Covid nasal swab results.       Relevant Orders   Novel Coronavirus, NAA ([facility])    Other Visit Diagnoses    Chills       Relevant Orders   Novel Coronavirus, NAA ([facility])       Meds ordered this encounter  Medications  . DISCONTD: ondansetron (ZOFRAN ODT) 4 MG disintegrating tablet    Sig: Take 1 tablet (4 mg total) by mouth every 8 (eight) hours as needed for nausea or vomiting.    Dispense:  15 tablet    Refill:  0  . benzonatate (TESSALON) 100 MG capsule    Sig: Take 1 capsule (100 mg total) by mouth 3 (three) times daily as needed for cough.    Dispense:  30 capsule    Refill:  0  . ondansetron (ZOFRAN ODT) 4 MG disintegrating tablet    Sig: Take 1 tablet (4 mg total) by mouth every 8 (eight) hours as needed for nausea or vomiting.    Dispense:  20 tablet    Refill:  0   Orders Placed This Encounter  Procedures  . Novel Coronavirus, NAA ([facility])    Order Specific Question:   Is this test for  diagnosis or screening    Answer:   Diagnosis of ill patient    Order Specific Question:   Symptomatic for COVID-19 as defined by CDC    Answer:   Yes    Order Specific Question:   Date of Symptom Onset    Answer:   10/10/2018    Order Specific Question:   Hospitalized for COVID-19    Answer:   No    Order Specific Question:   Admitted to ICU for COVID-19  Answer:   No    Order Specific Question:   Previously tested for COVID-19    Answer:   No    Order Specific Question:   Resident in a congregate (group) care setting    Answer:   No    Order Specific Question:   Employed in healthcare setting    Answer:   No    Order Specific Question:   Pregnant    Answer:   No    I discussed the assessment and treatment plan with the patient. The patient was provided an opportunity to ask questions and all were answered. The patient agreed with the plan and demonstrated an understanding of the instructions. The patient was advised to call back or seek an in-person evaluation if the symptoms worsen or if the condition fails to improve as anticipated.  Follow up plan: No follow-ups on file.  Ria Bush, MD

## 2018-10-11 NOTE — Assessment & Plan Note (Addendum)
Acute worsening headache, body aches, chills, nausea in setting of recent Covid exposure - will send for testing and recommend she stay out of work until feeling better. She will go tomorrow to be tested. Letter written for work to be mailed to patient. F/u based on Covid nasal swab results.

## 2018-10-12 ENCOUNTER — Other Ambulatory Visit: Payer: Self-pay

## 2018-10-12 DIAGNOSIS — Z20822 Contact with and (suspected) exposure to covid-19: Secondary | ICD-10-CM

## 2018-10-13 LAB — NOVEL CORONAVIRUS, NAA: SARS-CoV-2, NAA: NOT DETECTED

## 2018-10-14 ENCOUNTER — Other Ambulatory Visit: Payer: Self-pay | Admitting: Family Medicine

## 2018-10-14 DIAGNOSIS — J309 Allergic rhinitis, unspecified: Secondary | ICD-10-CM

## 2018-10-14 MED ORDER — DOXYCYCLINE HYCLATE 100 MG PO TABS: 100.0000 mg | ORAL_TABLET | Freq: Two times a day (BID) | ORAL | 0 refills | Status: DC

## 2018-10-14 NOTE — Telephone Encounter (Signed)
ERROR

## 2018-10-16 MED ORDER — FLUCONAZOLE 150 MG PO TABS: 150.0000 mg | ORAL_TABLET | Freq: Once | ORAL | 0 refills | Status: AC

## 2018-10-16 NOTE — Addendum Note (Signed)
Addended by: Ria Bush on: 10/16/2018 08:06 PM   Modules accepted: Orders

## 2018-10-17 ENCOUNTER — Ambulatory Visit (INDEPENDENT_AMBULATORY_CARE_PROVIDER_SITE_OTHER): Payer: 59 | Admitting: Family Medicine

## 2018-10-17 ENCOUNTER — Encounter: Payer: Self-pay | Admitting: Family Medicine

## 2018-10-17 ENCOUNTER — Other Ambulatory Visit: Payer: Self-pay

## 2018-10-17 VITALS — Ht 62.0 in | Wt 204.0 lb

## 2018-10-17 DIAGNOSIS — J019 Acute sinusitis, unspecified: Secondary | ICD-10-CM

## 2018-10-17 DIAGNOSIS — J309 Allergic rhinitis, unspecified: Secondary | ICD-10-CM | POA: Diagnosis not present

## 2018-10-17 MED ORDER — ALBUTEROL SULFATE HFA 108 (90 BASE) MCG/ACT IN AERS: 1.0000 | INHALATION_SPRAY | Freq: Four times a day (QID) | RESPIRATORY_TRACT | 1 refills | Status: DC | PRN

## 2018-10-17 NOTE — Progress Notes (Signed)
Virtual visit completed through Doxy.Me. Due to national recommendations of social distancing due to COVID-19, a virtual visit is felt to be most appropriate for this patient at this time. Reviewed limitations of a virtual visit.   Patient location: home Provider location: [place] at [facility], office If any vitals were documented, they were collected by patient at home unless specified below.    Ht 5' 2"  (1.575 m)   Wt 204 lb (92.5 kg)   LMP 08/12/2015 Comment: patient states hx. tubal ligation  BMI 37.31 kg/m    CC: f/u HA Subjective:    Patient ID: Abigail [name], female    DOB: 11-Dec-1965, 53 y.o.   MRN: [id number]  HPI: Abigail [name] is a 53 y.o. female presenting on 10/17/2018 for Results (Discuss Covid results. )   See prior note for details.  Seen last week with 1 month of headache, nasal congestion, and acute worsening nausea, body aches and chills. Tested for Covid - negative. We treated for sinusitis with doxy course (PCN allergy).   Feeling some better but still nauseated, no appetite. Nausea ongoing prior to antibiotics - treating with zofran with benefit. HA slowly improving. Congestion is improving. Body aches are improving. No fevers. Ongoing shoulder blade discomfort. Cough present in evenings, mildly productive at times - will let us know if persists after finishing abx.   She did have PNA 12/2017.   She works for [employer] exposed to [employer]. She has been using masks and gloves.   Requests return to work date.      Relevant past medical, surgical, family and social history reviewed and updated as indicated. Interim medical history since our last visit reviewed. Allergies and medications reviewed and updated. Outpatient Medications Prior to Visit  Medication Sig Dispense Refill  . benzonatate (TESSALON) 100 MG capsule Take 1 capsule (100 mg total) by mouth 3 (three) times daily as needed for cough. 30 capsule 0  . doxycycline (VIBRA-TABS)  100 MG tablet Take 1 tablet (100 mg total) by mouth 2 (two) times daily. 14 tablet 0  . esomeprazole (NEXIUM) 40 MG capsule TAKE 1 CAPSULE BY MOUTH DAILY (Patient taking differently: Take 40 mg by mouth daily before breakfast. ) 90 capsule 1  . FLUoxetine (PROZAC) 40 MG capsule Take 1 capsule (40 mg total) by mouth daily. 90 capsule 3  . HYDROcodone-acetaminophen (NORCO) 5-325 MG tablet Take 1 tablet by mouth every 6 (six) hours as needed for severe pain. 10 tablet 0  . levothyroxine (SYNTHROID, LEVOTHROID) 50 MCG tablet Take 1 tablet by mouth every morning on an empty stomach with water. No food or other medications for 30 min. (Patient taking differently: Take 50 mcg by mouth daily before breakfast. No food or other medications for 30 min.) 90 tablet 3  . linaclotide (LINZESS) 72 MCG capsule TAKE 1 CAPSULE BY MOUTH ONCE DAILY BEFORE BREAKFAST 90 capsule 3  . meloxicam (MOBIC) 15 MG tablet Take 15 mg by mouth every morning.    . methocarbamol (ROBAXIN) 500 MG tablet Take 500 mg by mouth 3 (three) times daily.     . metoprolol tartrate (LOPRESSOR) 25 MG tablet Take 1 tablet by mouth twice daily 60 tablet 5  . ondansetron (ZOFRAN ODT) 4 MG disintegrating tablet Take 1 tablet (4 mg total) by mouth every 8 (eight) hours as needed for nausea or vomiting. 20 tablet 0  . oxybutynin (DITROPAN-XL) 5 MG 24 hr tablet Take 5 mg by mouth daily.  1  .  Peppermint Oil (IBGARD PO) Take by mouth.    . rosuvastatin (CRESTOR) 20 MG tablet Take 20 mg by mouth daily.    . traMADol (ULTRAM) 50 MG tablet Take 50 mg by mouth every morning.     [name] albuterol (PROVENTIL HFA;VENTOLIN HFA) 108 (90 Base) MCG/ACT inhaler Inhale 1-2 puffs into the lungs every 6 (six) hours as needed for wheezing or shortness of breath (cough). (Patient taking differently: Inhale 1-2 puffs into the lungs every 6 (six) hours as needed (coughing, shortness of breath, or wheezing). ) 1 Inhaler 0   No facility-administered medications prior to visit.       Per HPI unless specifically indicated in ROS section below Review of Systems Objective:    Ht 5' 2"  (1.575 m)   Wt 204 lb (92.5 kg)   LMP 08/12/2015 Comment: patient states hx. tubal ligation  BMI 37.31 kg/m   Wt Readings from Last 3 Encounters:  10/17/18 204 lb (92.5 kg)  10/11/18 204 lb (92.5 kg)  07/22/18 206 lb (93.4 kg)     Physical exam: Gen: alert, NAD, not ill appearing Pulm: speaks in complete sentences without increased work of breathing Psych: normal mood, normal thought content      Results for orders placed or performed in visit on 10/12/18  Novel Coronavirus, NAA ([facility])   Specimen: Oropharyngeal(OP) collection in vial transport medium   OROPHARYNGEA  TESTING  Result Value Ref Range   SARS-CoV-2, NAA Not Detected Not Detected   Assessment & Plan:   Problem List Items Addressed This Visit    Allergic rhinitis   Relevant Medications   albuterol (VENTOLIN HFA) 108 (90 Base) MCG/ACT inhaler   Acute sinusitis    Undergoing treatment for acute bacterial sinusitis with doxy course with improvement each day. For ongoing nausea, has zofran to use. Requests diflucan after abx and albuterol inhaler refill. Requests letter to return to work. She fortunately tested negative for Covid last week.           Meds ordered this encounter  Medications  . albuterol (VENTOLIN HFA) 108 (90 Base) MCG/ACT inhaler    Sig: Inhale 1-2 puffs into the lungs every 6 (six) hours as needed for wheezing or shortness of breath (cough).    Dispense:  18 g    Refill:  1   No orders of the defined types were placed in this encounter.   I discussed the assessment and treatment plan with the patient. The patient was provided an opportunity to ask questions and all were answered. The patient agreed with the plan and demonstrated an understanding of the instructions. The patient was advised to call back or seek an in-person evaluation if the symptoms worsen or if the condition fails to  improve as anticipated.  Follow up plan: No follow-ups on file.  Ria Bush, MD

## 2018-10-17 NOTE — Assessment & Plan Note (Signed)
Undergoing treatment for acute bacterial sinusitis with doxy course with improvement each day. For ongoing nausea, has zofran to use. Requests diflucan after abx and albuterol inhaler refill. Requests letter to return to work. She fortunately tested negative for Covid last week.

## 2018-11-09 ENCOUNTER — Telehealth: Payer: Self-pay | Admitting: Family Medicine

## 2018-11-09 DIAGNOSIS — R059 Cough, unspecified: Secondary | ICD-10-CM

## 2018-11-09 DIAGNOSIS — R05 Cough: Secondary | ICD-10-CM

## 2018-11-09 NOTE — Telephone Encounter (Signed)
Patient did a virtual visit at the beginning of August. Patient was told if the right side pain and chest pain continued after she finished her antibiotic to let Dr.G know and he might order an x-ray.  Patient said the right side pain is consistent and it goes across under her ribs and her chest to her shoulder blades.  Patient still has white,green,yellow drainage and cough. Patient uses [employer].

## 2018-11-09 NOTE — Telephone Encounter (Signed)
Spoke with pt notifying her Dr. Darnell Level has ordered a CXR to be done at our office.  Provided backdoor info and suggested attire (per Terri).  Pt verbalizes understanding.  Says she can come first thing Thurs (11/09/20) morning.  Also, informed pt of Dr. Synthia Innocent [facility] recommendation.

## 2018-11-09 NOTE — Telephone Encounter (Signed)
Symptoms ongoing for 2 months now. Tested negative for Covid last month. Treated for acute sinusitis with doxy course last month. Ongoing cough with R chest pain.  I have ordered CXR. Will see if we are able to do here. Would need to give backdoor info.  If worsening, recommend go to [facility] for evaluation.

## 2018-11-10 ENCOUNTER — Ambulatory Visit (INDEPENDENT_AMBULATORY_CARE_PROVIDER_SITE_OTHER)
Admission: RE | Admit: 2018-11-10 | Discharge: 2018-11-10 | Disposition: A | Discharge status: Home / Self Care | Payer: 59 | Source: Ambulatory Visit | Attending: Family Medicine | Admitting: Family Medicine

## 2018-11-10 DIAGNOSIS — R05 Cough: Secondary | ICD-10-CM

## 2018-11-10 DIAGNOSIS — R059 Cough, unspecified: Secondary | ICD-10-CM

## 2018-11-18 ENCOUNTER — Other Ambulatory Visit: Payer: Self-pay | Admitting: Family Medicine

## 2018-11-28 ENCOUNTER — Other Ambulatory Visit: Payer: Self-pay | Admitting: Primary Care

## 2018-11-28 DIAGNOSIS — E034 Atrophy of thyroid (acquired): Secondary | ICD-10-CM

## 2018-11-29 ENCOUNTER — Ambulatory Visit (INDEPENDENT_AMBULATORY_CARE_PROVIDER_SITE_OTHER): Payer: 59 | Admitting: Primary Care

## 2018-11-29 ENCOUNTER — Encounter: Payer: Self-pay | Admitting: Primary Care

## 2018-11-29 VITALS — Wt 204.0 lb

## 2018-11-29 DIAGNOSIS — M545 Low back pain, unspecified: Secondary | ICD-10-CM

## 2018-11-29 DIAGNOSIS — E1165 Type 2 diabetes mellitus with hyperglycemia: Secondary | ICD-10-CM | POA: Insufficient documentation

## 2018-11-29 DIAGNOSIS — R1011 Right upper quadrant pain: Secondary | ICD-10-CM | POA: Diagnosis not present

## 2018-11-29 DIAGNOSIS — R1013 Epigastric pain: Secondary | ICD-10-CM | POA: Diagnosis not present

## 2018-11-29 DIAGNOSIS — E7849 Other hyperlipidemia: Secondary | ICD-10-CM

## 2018-11-29 DIAGNOSIS — E039 Hypothyroidism, unspecified: Secondary | ICD-10-CM | POA: Diagnosis not present

## 2018-11-29 DIAGNOSIS — K219 Gastro-esophageal reflux disease without esophagitis: Secondary | ICD-10-CM

## 2018-11-29 DIAGNOSIS — G8929 Other chronic pain: Secondary | ICD-10-CM

## 2018-11-29 DIAGNOSIS — E119 Type 2 diabetes mellitus without complications: Secondary | ICD-10-CM | POA: Insufficient documentation

## 2018-11-29 DIAGNOSIS — R7303 Prediabetes: Secondary | ICD-10-CM | POA: Diagnosis not present

## 2018-11-29 DIAGNOSIS — J309 Allergic rhinitis, unspecified: Secondary | ICD-10-CM

## 2018-11-29 HISTORY — DX: Right upper quadrant pain: R10.11

## 2018-11-29 NOTE — Assessment & Plan Note (Signed)
Mostly compliant to daily Nexium, continue same.

## 2018-11-29 NOTE — Progress Notes (Signed)
Subjective:    Patient ID: Abigail [name], female    DOB: 03/21/1965, 53 y.o.   MRN: [id number]  HPI  Virtual Visit via Video Note  I connected with [place] on 11/29/18 at  9:40 AM EDT by a video enabled telemedicine application and verified that I am speaking with the correct person using two identifiers.  Location: Patient: Home Provider: Office   I discussed the limitations of evaluation and management by telemedicine and the availability of in person appointments. The patient expressed understanding and agreed to proceed.  History of Present Illness:  Ms. Scherer is a 53 year old female who presents today with a chief complaint of rib pain and abdominal pain. Also with cough in the morning with congestion, chronic.   Her pain is located to the right side of her ribs, right anterior [name] ribs, RUQ and epigastric region of her abdomen.   She was evaluated by Dr. Danise Mina on 10/17/18 for symptoms of sinusitis. She was treated previously with Doxycycline course, still in the middle of treatment during her visit on the 10th. Also with nausea that had been chronic and intermittent for months, cough in the evenings that was mildly productive at times. She requested albuterol inhaler to use as needed for cough which was provided. Covid test negative.  She called in early September with complaints of continued right side and right [name] anterior rib pain with radiation to shoulder blades. Given negative Covid test and treatment with Doxycycline course a chest xray was ordered which was negative. She was encouraged to use benzonatate capsules or OTC cough suppressant and told to follow up if needed.  Since her last visit she continues to experience right side and [name] anterior rib pain. She has chronic nausea since April 2020 which is intermittent without regards to food, no resolve. She's also endorsing chronic right upper quadrant pain, epigastric pain, and bloating,  not necessarily worse with eating.   She is currently seeing GI and is managed on Linzess daily for constipation which was causing recurrent hemorrhoids. She is having bowel movements daily with daily use of Linzess, no recent hemorrhoids. She underwent CT renal stone in April 2020 which showed an unremarkable gall bladder and fatty liver. She is compliant to her Nexium daily except for the last few days. She's used Tramadol and Hydrocodone for pain without improvement.   She denies vomiting, unexplained weight loss, fevers. She is taking Zofran with temporary improvement in symptoms. Also compliant to probiotics and Gas-X after each meal.   Chronic congestion with cough each morning when waking. No fevers. She does not take an antihistamine. She typically takes her Nexium, no use in last couple of days.   Observations/Objective:  Alert and oriented. Appears well, not sickly. No distress. Speaking in complete sentences.   Assessment and Plan:  See problem based charting.  Follow Up Instructions:  You will be contacted regarding your HIDA scan of the gall bladder  Please let us know if you have not been contacted within one week.   Call the front office for lab testing.   I'll be in touch as soon as I receive your results.  It was a pleasure to see you today! [name], NP-C      I discussed the assessment and treatment plan with the patient. The patient was provided an opportunity to ask questions and all were answered. The patient agreed with the plan and demonstrated an understanding of the instructions.   The  patient was advised to call back or seek an in-person evaluation if the symptoms worsen or if the condition fails to improve as anticipated.    [name], NP    Review of Systems  Constitutional: Negative for fever.  HENT: Positive for congestion and postnasal drip.   Respiratory: Positive for cough. Negative for shortness of breath.   Cardiovascular:  Negative for chest pain.  Gastrointestinal: Positive for abdominal pain and nausea. Negative for constipation and diarrhea.  Allergic/Immunologic: Positive for environmental allergies.       Past Medical History:  Diagnosis Date  . Anxiety   . Bulging lumbar disc   . CAP (community acquired pneumonia)   . Cardiac arrhythmia due to congenital heart disease    no arrhythmia identified   . Celiac disease   . Depression   . Diverticulosis    Sigmoid colon  . Fatty liver 05/2016  . Frequent headaches   . GERD (gastroesophageal reflux disease)   . Heart disease   . History of kidney stones   . Hypertension   . Kidney stones   . Migraine   . Obesity (BMI 35.0-39.9 without comorbidity)   . UTI (urinary tract infection)      Social History   Socioeconomic History  . Marital status: Single    Spouse name: Not on file  . Number of children: Not on file  . Years of education: Not on file  . Highest education level: Not on file  Occupational History  . Not on file  Social Needs  . Financial resource strain: Not on file  . Food insecurity    Worry: Not on file    Inability: Not on file  . Transportation needs    Medical: Not on file    Non-medical: Not on file  Tobacco Use  . Smoking status: Former Smoker    Types: Cigarettes    Quit date: 2008    Years since quitting: 12.7  . Smokeless tobacco: Never Used  . Tobacco comment: used tobacco "socially"  Substance and Sexual Activity  . Alcohol use: No    Alcohol/week: 0.0 standard drinks  . Drug use: No  . Sexual activity: Not on file  Lifestyle  . Physical activity    Days per week: Not on file    Minutes per session: Not on file  . Stress: Not on file  Relationships  . Social Herbalist on phone: Not on file    Gets together: Not on file    Attends religious service: Not on file    Active member of club or organization: Not on file    Attends meetings of clubs or organizations: Not on file     Relationship status: Not on file  . Intimate partner violence    Fear of current or ex partner: Not on file    Emotionally abused: Not on file    Physically abused: Not on file    Forced sexual activity: Not on file  Other Topics Concern  . Not on file  Social History Narrative   Single.   Has a set of Twins, age 91.   Works for the [employer] and [employer] and [employer]   Enjoys relaxing, spending time with her children.    Past Surgical History:  Procedure Laterality Date  . ABDOMINAL EXPLORATION SURGERY  1986  . COLONOSCOPY  06/2016  . EXTRACORPOREAL SHOCK WAVE LITHOTRIPSY Left 12/03/2016   Procedure: LEFT EXTRACORPOREAL SHOCK WAVE LITHOTRIPSY (  ESWL);  Surgeon: Irine Seal, MD;  Location: [facility];  Service: Urology;  Laterality: Left;  . KIDNEY STONE SURGERY    . LEFT HEART CATH AND CORONARY ANGIOGRAPHY  07/28/2016    Large tortuous coronary arteries no radiographic evidence of disease.  Not anginal chest pain.  Normal EF.  [name] LITHOTRIPSY    . NASAL SINUS SURGERY    . ROTATOR CUFF REPAIR Right 12/27/2017  . TRANSTHORACIC ECHOCARDIOGRAM  06/2017   EF 60-65%.  GR 1-2 DD.  Otherwise normal echo.  Normal valve function.  Normal wall motion.  . TUBAL LIGATION      Family History  Problem Relation Age of Onset  . COPD Father   . Emphysema Father   . Gallstones Mother   . Colon cancer Neg Hx   . Stomach cancer Neg Hx   . Esophageal cancer Neg Hx     Allergies  Allergen Reactions  . Penicillins Nausea Only    Has patient had a PCN reaction causing immediate rash, facial/tongue/throat swelling, SOB or lightheadedness with hypotension: No Has patient had a PCN reaction causing severe rash involving mucus membranes or skin necrosis: No Has patient had a PCN reaction that required hospitalization: No Has patient had a PCN reaction occurring within the last 10 years: No If all of the above answers are "NO", then may proceed with Cephalosporin use.   . Latex Rash     Current Outpatient Medications on File Prior to Visit  Medication Sig Dispense Refill  . albuterol (VENTOLIN HFA) 108 (90 Base) MCG/ACT inhaler Inhale 1-2 puffs into the lungs every 6 (six) hours as needed for wheezing or shortness of breath (cough). 18 g 1  . esomeprazole (NEXIUM) 40 MG capsule TAKE 1 CAPSULE BY MOUTH DAILY (Patient taking differently: Take 40 mg by mouth daily before breakfast. ) 90 capsule 1  . FLUoxetine (PROZAC) 40 MG capsule Take 1 capsule (40 mg total) by mouth daily. 90 capsule 3  . HYDROcodone-acetaminophen (NORCO) 5-325 MG tablet Take 1 tablet by mouth every 6 (six) hours as needed for severe pain. 10 tablet 0  . levothyroxine (SYNTHROID, LEVOTHROID) 50 MCG tablet Take 1 tablet by mouth every morning on an empty stomach with water. No food or other medications for 30 min. (Patient taking differently: Take 50 mcg by mouth daily before breakfast. No food or other medications for 30 min.) 90 tablet 3  . linaclotide (LINZESS) 72 MCG capsule TAKE 1 CAPSULE BY MOUTH ONCE DAILY BEFORE BREAKFAST 90 capsule 3  . meloxicam (MOBIC) 15 MG tablet Take 15 mg by mouth every morning.    . methocarbamol (ROBAXIN) 500 MG tablet Take 500 mg by mouth 3 (three) times daily.     . metoprolol tartrate (LOPRESSOR) 25 MG tablet Take 1 tablet by mouth twice daily 60 tablet 5  . ondansetron (ZOFRAN ODT) 4 MG disintegrating tablet Take 1 tablet (4 mg total) by mouth every 8 (eight) hours as needed for nausea or vomiting. 20 tablet 0  . oxybutynin (DITROPAN-XL) 5 MG 24 hr tablet Take 5 mg by mouth daily.  1  . Peppermint Oil (IBGARD PO) Take by mouth.    . rosuvastatin (CRESTOR) 20 MG tablet Take 20 mg by mouth daily.    . traMADol (ULTRAM) 50 MG tablet Take 50 mg by mouth every morning.      No current facility-administered medications on file prior to visit.     Wt 204 lb (92.5 kg)   LMP 08/12/2015 Comment: patient  states hx. tubal ligation  BMI 37.31 kg/m    Objective:   Physical Exam   Constitutional: She is oriented to person, place, and time. She appears well-nourished. She does not have a sickly appearance. She does not appear ill.  Respiratory: Effort normal.  Neurological: She is alert and oriented to person, place, and time.  Psychiatric: She has a normal mood and affect.           Assessment & Plan:

## 2018-11-29 NOTE — Assessment & Plan Note (Signed)
Chronic. Suspect this could be causing morning congestion with cough. Discussed to start nightly antihistamine and resume Nexium daily.

## 2018-11-29 NOTE — Assessment & Plan Note (Signed)
Following with neurosurgery, doing well overall. Encouraged weight loss.

## 2018-11-29 NOTE — Assessment & Plan Note (Signed)
Seems to be the problematic region as she describes her right lower rib pain to be under the ribs. Chronic intermittent nausea.   Differentials include cholecystitis, cholelithiasis, IBS. She does have epigastric abdominal pain so this could be hiatal hernia, pancreatitis (less likely).  Labs pending including CBC, Lipase, CMP. Given normal CT scan in April 2020 we will proceed with HIDA scan. If HIDA scan unremarkable then we will defer back to GI.  Await results. She is in no distress and is stable for outpatient treatment.

## 2018-11-29 NOTE — Assessment & Plan Note (Signed)
No recent lipid panel on file, repeat lipids pending. Discussed the importance of a healthy diet and regular exercise in order for weight loss, and to reduce the risk of any potential medical problems.

## 2018-11-29 NOTE — Patient Instructions (Signed)
You will be contacted regarding your HIDA scan of the gall bladder  Please let us know if you have not been contacted within one week.   Call the front office for lab testing.   I'll be in touch as soon as I receive your results.  It was a pleasure to see you today! [name], NP-C

## 2018-11-29 NOTE — Assessment & Plan Note (Signed)
No recent TSH on file, repeat TSH pending.

## 2018-11-30 ENCOUNTER — Other Ambulatory Visit (INDEPENDENT_AMBULATORY_CARE_PROVIDER_SITE_OTHER): Payer: 59

## 2018-11-30 DIAGNOSIS — E039 Hypothyroidism, unspecified: Secondary | ICD-10-CM

## 2018-11-30 DIAGNOSIS — R1013 Epigastric pain: Secondary | ICD-10-CM

## 2018-11-30 DIAGNOSIS — E7849 Other hyperlipidemia: Secondary | ICD-10-CM

## 2018-11-30 DIAGNOSIS — R7303 Prediabetes: Secondary | ICD-10-CM | POA: Diagnosis not present

## 2018-11-30 DIAGNOSIS — R1011 Right upper quadrant pain: Secondary | ICD-10-CM

## 2018-11-30 LAB — CBC WITH DIFFERENTIAL/PLATELET
Basophils Absolute: 0 10*3/uL (ref 0.0–0.1)
Basophils Relative: 0.6 % (ref 0.0–3.0)
Eosinophils Absolute: 0.4 10*3/uL (ref 0.0–0.7)
Eosinophils Relative: 5.2 % — ABNORMAL HIGH (ref 0.0–5.0)
HCT: 40.8 % (ref 36.0–46.0)
Hemoglobin: 13.4 g/dL (ref 12.0–15.0)
Lymphocytes Relative: 25.3 % (ref 12.0–46.0)
Lymphs Abs: 1.9 10*3/uL (ref 0.7–4.0)
MCHC: 32.9 g/dL (ref 30.0–36.0)
MCV: 85 fl (ref 78.0–100.0)
Monocytes Absolute: 0.4 10*3/uL (ref 0.1–1.0)
Monocytes Relative: 5.5 % (ref 3.0–12.0)
Neutro Abs: 4.8 10*3/uL (ref 1.4–7.7)
Neutrophils Relative %: 63.4 % (ref 43.0–77.0)
Platelets: 281 10*3/uL (ref 150.0–400.0)
RBC: 4.8 Mil/uL (ref 3.87–5.11)
RDW: 15.4 % (ref 11.5–15.5)
WBC: 7.5 10*3/uL (ref 4.0–10.5)

## 2018-11-30 LAB — COMPREHENSIVE METABOLIC PANEL
ALT: 87 U/L — ABNORMAL HIGH (ref 0–35)
AST: 108 U/L — ABNORMAL HIGH (ref 0–37)
Albumin: 3.9 g/dL (ref 3.5–5.2)
Alkaline Phosphatase: 123 U/L — ABNORMAL HIGH (ref 39–117)
BUN: 10 mg/dL (ref 6–23)
CO2: 29 mEq/L (ref 19–32)
Calcium: 9.3 mg/dL (ref 8.4–10.5)
Chloride: 98 mEq/L (ref 96–112)
Creatinine, Ser: 0.65 mg/dL (ref 0.40–1.20)
GFR: 95.03 mL/min (ref >60.00)
Glucose, Bld: 164 mg/dL — ABNORMAL HIGH (ref 70–99)
Potassium: 4.5 mEq/L (ref 3.5–5.1)
Sodium: 138 mEq/L (ref 135–145)
Total Bilirubin: 0.7 mg/dL (ref 0.2–1.2)
Total Protein: 6.4 g/dL (ref 6.0–8.3)

## 2018-11-30 LAB — LIPID PANEL
Cholesterol: 148 mg/dL (ref 0–200)
HDL: 26.2 mg/dL — ABNORMAL LOW (ref >39.00)
LDL Cholesterol: 89 mg/dL (ref 0–99)
NonHDL: 121.76
Total CHOL/HDL Ratio: 6
Triglycerides: 165 mg/dL — ABNORMAL HIGH (ref 0.0–149.0)
VLDL: 33 mg/dL (ref 0.0–40.0)

## 2018-11-30 LAB — TSH: TSH: 8.69 u[IU]/mL — ABNORMAL HIGH (ref 0.35–4.50)

## 2018-11-30 LAB — HEMOGLOBIN A1C: Hgb A1c MFr Bld: 8.7 % — ABNORMAL HIGH (ref 4.6–6.5)

## 2018-11-30 LAB — LIPASE: Lipase: 38 U/L (ref 11.0–59.0)

## 2018-12-01 ENCOUNTER — Other Ambulatory Visit: Payer: Self-pay | Admitting: Primary Care

## 2018-12-01 ENCOUNTER — Encounter: Payer: Self-pay | Admitting: Primary Care

## 2018-12-01 DIAGNOSIS — E119 Type 2 diabetes mellitus without complications: Secondary | ICD-10-CM

## 2018-12-01 DIAGNOSIS — E039 Hypothyroidism, unspecified: Secondary | ICD-10-CM

## 2018-12-01 MED ORDER — METFORMIN HCL 500 MG PO TABS: 500.0000 mg | ORAL_TABLET | Freq: Two times a day (BID) | ORAL | 1 refills | Status: DC

## 2018-12-01 NOTE — Assessment & Plan Note (Signed)
For some reason levothyroxine was removed from her medication list.  In fact she has been compliant monthly but is taking incorrectly.  Discussed proper instructions for taking levothyroxine, repeat TSH in 4 weeks.  If above goal at that point then will increase dose to 75 mcg.

## 2018-12-01 NOTE — Assessment & Plan Note (Signed)
Recent diagnosis with A1C of 8.7 from 5.8 last year. Discussed results with patient via phone and discussed treatment.  Rx for Metformin 500 mg course sent to pharmacy. Discussed to start with 1 tablet once daily for 2 weeks, then increase to 1 tablet BID thereafter.  Managed on statin. Urine microalbumin next visit. Foot exam next visit. Pneumonia vaccination next visit.   Discussed to work on improving her diet and regular exercise.  Referral placed to diabetic nutritionist.  Repeat A1C in three months.

## 2018-12-09 ENCOUNTER — Encounter (HOSPITAL_COMMUNITY): Admission: RE | Admit: 2018-12-09 | Payer: 59 | Source: Ambulatory Visit

## 2018-12-13 ENCOUNTER — Ambulatory Visit: Payer: Self-pay

## 2018-12-13 MED ORDER — BLOOD GLUCOSE METER KIT: PACK | 0 refills | Status: DC

## 2018-12-13 NOTE — Telephone Encounter (Signed)
Please supply patient with a glucometer Rx to her pharmacy. She can check blood sugars before any meal, 2 hours after any meal, bedtime. Have her check at least twice daily for the next two weeks and send me readings.

## 2018-12-13 NOTE — Addendum Note (Signed)
Addended by: Jacqualin Combes on: 12/13/2018 03:38 PM   Modules accepted: Orders

## 2018-12-13 NOTE — Telephone Encounter (Signed)
Pt. Reports she started feeling bad around 11:30. Feels shaky,nauseated, legs feel weak, a little disoriented. Ate chicken salad, chips,pretzels,M&M's. Driving alone. Instructed to go to nearest [employer]. Pt. At [facility]. Now. Blood glucose at [employer] 116. BP 134/82. Feels better now and going home. Please contact pt. For follow up. Reports she has been having "lows and does not have a glucometer that works."  Answer Assessment - Initial Assessment Questions 1. SYMPTOMS: "What symptoms are you concerned about?"     Feels shaky, nausea  2. ONSET:  "When did the symptoms start?"     Around 12:00 3. BLOOD GLUCOSE: "What is your blood glucose level?"      Unsure 4. USUAL RANGE: "What is your blood glucose level usually?" (e.g., usual fasting morning value, usual evening value)     Unknown 5. TYPE 1 or 2:  "Do you know what type of diabetes you have?"  (e.g., Type 1, Type 2, Gestational; doesn't know)      Type 2 6. INSULIN: "Do you take insulin?" "What type of insulin(s) do you use? What is the mode of delivery? (syringe, pen; injection or pump) "When did you last give yourself an insulin dose?" (i.e., time or hours/minutes ago) "How much did you give?" (i.e., how many units)     No insulin 7. DIABETES PILLS: "Do you take any pills for your diabetes?"     Glucophage 8. OTHER SYMPTOMS: "Do you have any symptoms?" (e.g., fever, frequent urination, difficulty breathing, vomiting)     No 9. LOW BLOOD GLUCOSE TREATMENT: "What have you done so far to treat the low blood glucose level?"     Ate chicken salad, chips, pretzels,M&M's, drank a Sprite 10. FOOD: "When did you last eat or drink?"       12:15 11. ALONE: "Are you alone right now or is someone with you?"        Alone 12. PREGNANCY: "Is there any chance you are pregnant?" "When was your last menstrual period?"       No  Protocols used: DIABETES - LOW BLOOD SUGAR-A-AH

## 2018-12-13 NOTE — Telephone Encounter (Signed)
Spoken and notified patient of Kate Clark's comments. Patient verbalized understanding.  

## 2018-12-16 ENCOUNTER — Telehealth: Payer: Self-pay | Admitting: Primary Care

## 2018-12-16 NOTE — Telephone Encounter (Signed)
Patient saw on [employer] news today that there's a recall on Metformin.  Patient said she's on that medication and she wants to know what to do.

## 2018-12-19 ENCOUNTER — Telehealth: Payer: Self-pay | Admitting: Primary Care

## 2018-12-19 ENCOUNTER — Encounter: Payer: Self-pay | Admitting: Family Medicine

## 2018-12-19 ENCOUNTER — Ambulatory Visit (INDEPENDENT_AMBULATORY_CARE_PROVIDER_SITE_OTHER): Payer: 59 | Admitting: Family Medicine

## 2018-12-19 VITALS — Temp 98.2°F | Ht 62.0 in

## 2018-12-19 DIAGNOSIS — Z20828 Contact with and (suspected) exposure to other viral communicable diseases: Secondary | ICD-10-CM

## 2018-12-19 DIAGNOSIS — J029 Acute pharyngitis, unspecified: Secondary | ICD-10-CM

## 2018-12-19 DIAGNOSIS — R0982 Postnasal drip: Secondary | ICD-10-CM | POA: Diagnosis not present

## 2018-12-19 DIAGNOSIS — R05 Cough: Secondary | ICD-10-CM

## 2018-12-19 DIAGNOSIS — R059 Cough, unspecified: Secondary | ICD-10-CM

## 2018-12-19 MED ORDER — BENZONATATE 200 MG PO CAPS: 200.0000 mg | ORAL_CAPSULE | Freq: Three times a day (TID) | ORAL | 3 refills | Status: DC | PRN

## 2018-12-19 MED ORDER — HYDROCODONE-CHLORPHENIRAMINE 5-4 MG/5ML PO SOLN: 5.0000 mL | Freq: Every evening | ORAL | 0 refills | Status: DC | PRN

## 2018-12-19 NOTE — Telephone Encounter (Signed)
Spoken to patient and she have already check with the pharmacy. Inform patient that her metformin is not on recall.

## 2018-12-19 NOTE — Progress Notes (Signed)
Abigail Borre T. Ronak Duquette, MD Primary Care and Sports Medicine [facility] at [facility] [place], [address] Phone: [phone]  FAX: [place] - 53 y.o. female  MRN [id number]  Date of Birth: 1965/09/11  Visit Date: 12/19/2018  PCP: [name], NP  Referred by: [name], NP Chief Complaint  Patient presents with  . Cough    Started on Friday  . Sore Throat  . Nasal Congestion   Virtual Visit via Video Note:  I connected with  Abigail [name] on 12/19/2018  3:40 PM EDT by a video enabled telemedicine application and verified that I am speaking with the correct person using two identifiers.   Location patient: home computer, tablet, or smartphone Location provider: work or home office Consent: Verbal consent directly obtained from [employer]. Persons participating in the virtual visit: patient, provider  I discussed the limitations of evaluation and management by telemedicine and the availability of in person appointments. The patient expressed understanding and agreed to proceed.  Interactive audio and video telecommunications were attempted between this provider and patient, however failed, due to patient having technical difficulties OR patient did not have access to video capability.  We continued and completed visit with audio only.    History of Present Illness:  She is a pleasant lady who I recall quite well.  She started having a cough on Friday, and she also has had a sore throat since that time.  She also has had some progressive nasal drainage and nasal drainage down the posterior aspect of her throat.  She is coughing and has a productive cough.  This is worsened slightly over the weekend.  She denies having a fever.  She is able to walk without any shortness of breath.  No vomiting, with minimal loose stools and some occasional nausea.  No neurological changes  Review of  Systems as above: See pertinent positives and pertinent negatives per HPI No acute distress verbally  Past Medical History, Surgical History, Social History, Family History, Problem List, Medications, and Allergies have been reviewed and updated if relevant.   Observations/Objective/Exam:  An attempt was made to discern vital signs over the phone and per patient if applicable and possible.   General:    Alert, Oriented, appears well and in no acute distress HEENT:     Atraumatic, conjunctiva clear, no obvious abnormalities on inspection of external nose and ears.  Neck:    Normal movements of the head and neck Pulmonary:     On inspection no signs of respiratory distress, breathing rate appears normal, no obvious gross SOB, gasping or wheezing Cardiovascular:    No obvious cyanosis Musculoskeletal:    Moves all visible extremities without noticeable abnormality Psych / Neurological:     Pleasant and cooperative, no obvious depression or anxiety, speech and thought processing grossly intact  Assessment and Plan:    ICD-10-CM   1. Cough  R05   2. Sore throat  J02.9   3. Post-nasal drainage  R09.82    It is difficult to know if this is a simple viral infection, or potentially a COVID-19 infection.  Patient is instructed to get COVID-19 testing.  At her request, also sent in some Tussionex as well as some Tessalon Perles.  She also called me today and wanted some Zofran.  I think that those are reasonable things for symptomatic management.  If she does not improve or worsens later in  the week, and appropriate next step would be to start some antibiotics.  I discussed the assessment and treatment plan with the patient. The patient was provided an opportunity to ask questions and all were answered. The patient agreed with the plan and demonstrated an understanding of the instructions.   The patient was advised to call back or seek an in-person evaluation if the symptoms worsen or if  the condition fails to improve as anticipated.  Follow-up: prn unless noted otherwise below No follow-ups on file.  Meds ordered this encounter  Medications  . benzonatate (TESSALON) 200 MG capsule    Sig: Take 1 capsule (200 mg total) by mouth 3 (three) times daily as needed for cough.    Dispense:  40 capsule    Refill:  3  . HYDROcodone-Chlorpheniramine 5-4 MG/5ML SOLN    Sig: Take 5 mLs by mouth at bedtime as needed.    Dispense:  120 mL    Refill:  0   No orders of the defined types were placed in this encounter.   Signed,  Maud Deed. Zian Mohamed, MD

## 2018-12-19 NOTE — Telephone Encounter (Signed)
Best number [phone]  Pt called wanting to know if dr  copland would send in rx for zofran for nausea  [facility] [address]

## 2018-12-20 ENCOUNTER — Other Ambulatory Visit: Payer: Self-pay

## 2018-12-20 DIAGNOSIS — Z20822 Contact with and (suspected) exposure to covid-19: Secondary | ICD-10-CM

## 2018-12-20 MED ORDER — ONDANSETRON 8 MG PO TBDP: 8.0000 mg | ORAL_TABLET | Freq: Three times a day (TID) | ORAL | 1 refills | Status: DC | PRN

## 2018-12-20 NOTE — Telephone Encounter (Signed)
Done  Would you leave here a message?

## 2018-12-20 NOTE — Telephone Encounter (Signed)
Cole notified that Dr. Lorelei Pont has sent her in a prescription for Zofran to her pharmacy.

## 2018-12-22 LAB — NOVEL CORONAVIRUS, NAA: SARS-CoV-2, NAA: NOT DETECTED

## 2018-12-23 MED ORDER — AZITHROMYCIN 250 MG PO TABS: ORAL_TABLET | ORAL | 0 refills | Status: DC

## 2018-12-23 NOTE — Telephone Encounter (Signed)
Please notify patient that Dr. Lorelei Pont also sent and cough pills and cough syrup on October 12.

## 2018-12-23 NOTE — Telephone Encounter (Signed)
Noted. Dr. Lillie Fragmin note reviewed, he did recommend antibiotics if symptoms had not improved. Prescription for azithromycin 500 mg tablet sent to pharmacy. Please call patient on Monday and find out what days she is needing for her work note.

## 2018-12-23 NOTE — Addendum Note (Signed)
Addended by: [name] on: 12/23/2018 05:30 PM   Modules accepted: Orders

## 2018-12-23 NOTE — Telephone Encounter (Signed)
Per DPR, left detail message of Tawni Millers comments to call back.

## 2018-12-23 NOTE — Telephone Encounter (Signed)
Patient stated that her head is still hurting, cough and mucus is very thick. She stated that Dr Lorelei Pont stated that if she did not start feeling better he would send a prescription for the head hurting and coughing.   She stated she has been taking the Zofran for the nausea as needed.

## 2018-12-23 NOTE — Telephone Encounter (Signed)
Please notify patient that Dr. Edilia Bo sent in antinausea medicine, Zofran on October 13.

## 2018-12-23 NOTE — Telephone Encounter (Signed)
Patient stated that she is still cough and having a lot chest congestion. Patient stated that Dr Lorelei Pont told her that he would provide antibiotics since Covid test negative. She also needs a work note as well.

## 2018-12-23 NOTE — Telephone Encounter (Signed)
Patient is calling in regards to a prescription. She stated that Dr copland sent her for a covid test and that if she did not feel better to give him a call so something could be called in for her.

## 2018-12-26 ENCOUNTER — Telehealth: Payer: Self-pay | Admitting: Primary Care

## 2018-12-26 ENCOUNTER — Ambulatory Visit (INDEPENDENT_AMBULATORY_CARE_PROVIDER_SITE_OTHER): Payer: 59 | Admitting: Family Medicine

## 2018-12-26 ENCOUNTER — Encounter: Payer: Self-pay | Admitting: Family Medicine

## 2018-12-26 VITALS — Temp 98.4°F | Ht 62.0 in | Wt 203.0 lb

## 2018-12-26 DIAGNOSIS — R05 Cough: Secondary | ICD-10-CM | POA: Diagnosis not present

## 2018-12-26 DIAGNOSIS — J029 Acute pharyngitis, unspecified: Secondary | ICD-10-CM

## 2018-12-26 DIAGNOSIS — R059 Cough, unspecified: Secondary | ICD-10-CM

## 2018-12-26 MED ORDER — LEVOFLOXACIN 500 MG PO TABS: 500.0000 mg | ORAL_TABLET | Freq: Every day | ORAL | 0 refills | Status: AC

## 2018-12-26 MED ORDER — HYDROCOD POLST-CPM POLST ER 10-8 MG/5ML PO SUER: 5.0000 mL | Freq: Every evening | ORAL | 0 refills | Status: DC | PRN

## 2018-12-26 MED ORDER — HYDROCODONE-CHLORPHENIRAMINE 5-4 MG/5ML PO SOLN: 5.0000 mL | Freq: Every evening | ORAL | 0 refills | Status: DC | PRN

## 2018-12-26 NOTE — Telephone Encounter (Signed)
Huyen ,[facility], called to clarify the strength of Hydrocodone that was just called in to pharmacy.

## 2018-12-26 NOTE — Addendum Note (Signed)
Addended by: Owens Loffler on: 12/26/2018 02:51 PM   Modules accepted: Orders

## 2018-12-26 NOTE — Telephone Encounter (Signed)
Spoke to pharm and changed

## 2018-12-26 NOTE — Progress Notes (Addendum)
Psalm Schappell T. Keyli Duross, MD Primary Care and Sports Medicine [facility] [place], [address] Phone: [phone]  FAX: [place] - 53 y.o. female  MRN [id number]  Date of Birth: 1965/10/08  Visit Date: 12/26/2018  PCP: [name], NP  Referred by: [name], NP Chief Complaint  Patient presents with  . Follow-up    Cough/Congestion   Virtual Visit via Video Note:  I connected with  [place] on 12/26/2018 11:00 AM EDT by a video enabled telemedicine application and verified that I am speaking with the correct person using two identifiers.   Location patient: home computer, tablet, or smartphone Location provider: work or home office Consent: Verbal consent directly obtained from [employer]. Persons participating in the virtual visit: patient, provider  I discussed the limitations of evaluation and management by telemedicine and the availability of in person appointments. The patient expressed understanding and agreed to proceed.  Interactive audio and video telecommunications were attempted between this provider and patient, however failed, due to patient having technical difficulties OR patient did not have access to video capability.  We continued and completed visit with audio only.    History of Present Illness:  I did a virtual visit with the patient last week, and she ultimately called my partner Ms. Clark, and she gave her some Z-Pak.  Unfortunately the patient did not fill that until yesterday.  She has only had 1 dose.  She calls again to see if she could potentially be worse or possibly had pneumonia. Covid-19 neg  1 day zpak  Refill tussoinex lvq if fails, sent to pharmacy  Review of Systems as above: See pertinent positives and pertinent negatives per HPI No acute distress verbally  Past Medical History, Surgical History, Social History, Family  History, Problem List, Medications, and Allergies have been reviewed and updated if relevant.   Observations/Objective/Exam:  An attempt was made to discern vital signs over the phone and per patient if applicable and possible.   General:    Alert, Oriented, appears well and in no acute distress HEENT:     Atraumatic, conjunctiva clear, no obvious abnormalities on inspection of external nose and ears.  Neck:    Normal movements of the head and neck Pulmonary:     On inspection no signs of respiratory distress, breathing rate appears normal, no obvious gross SOB, gasping or wheezing Cardiovascular:    No obvious cyanosis Musculoskeletal:    Moves all visible extremities without noticeable abnormality Psych / Neurological:     Pleasant and cooperative, no obvious depression or anxiety, speech and thought processing grossly intact  Assessment and Plan:    ICD-10-CM   1. Cough  R05   2. Sore throat  J02.9    Cannot exclude pneumonia, but she has not fulfilled her course of azithromycin.  I counseled that she should continue this, and if she is not better or worsening on Wednesday, then taking her Levaquin would be a reasonable next step.  I discussed the assessment and treatment plan with the patient. The patient was provided an opportunity to ask questions and all were answered. The patient agreed with the plan and demonstrated an understanding of the instructions.   The patient was advised to call back or seek an in-person evaluation if the symptoms worsen or if the condition fails to improve as anticipated.  Spoke to pharmacy.  5 mg strength not available  so converted to 10 mg tussionex.  Follow-up: prn unless noted otherwise below No follow-ups on file.  Meds ordered this encounter  Medications  . levofloxacin (LEVAQUIN) 500 MG tablet    Sig: Take 1 tablet (500 mg total) by mouth daily for 10 days.    Dispense:  10 tablet    Refill:  0  . DISCONTD:  HYDROcodone-Chlorpheniramine 5-4 MG/5ML SOLN    Sig: Take 5 mLs by mouth at bedtime as needed.    Dispense:  40 mL    Refill:  0  . chlorpheniramine-HYDROcodone (TUSSIONEX PENNKINETIC ER) 10-8 MG/5ML SUER    Sig: Take 5 mLs by mouth at bedtime as needed for cough.    Dispense:  40 mL    Refill:  0   No orders of the defined types were placed in this encounter.   Signed,  Maud Deed. Xitlally Mooneyham, MD

## 2018-12-26 NOTE — Telephone Encounter (Signed)
Patient had an appointment with Dr Lorelei Pont, today 12/26/2018  Called [facility] and was told that the strength of Hydrocodone-Chlorpheniramine 5-4 mg is not available. Also pharmacist insisted on speaking to the provider on this Rx. I have inform her that provider and seeing patient.

## 2018-12-26 NOTE — Telephone Encounter (Signed)
Patient was seen again by Dr Lorelei Pont on 12/26/2018

## 2018-12-27 ENCOUNTER — Other Ambulatory Visit: Payer: Self-pay | Admitting: Primary Care

## 2018-12-27 DIAGNOSIS — E034 Atrophy of thyroid (acquired): Secondary | ICD-10-CM

## 2018-12-28 ENCOUNTER — Telehealth: Payer: Self-pay | Admitting: Primary Care

## 2018-12-28 NOTE — Telephone Encounter (Signed)
Patient stated that she is not getting any better She stated she is hurting in her back and chest from coughing so much. Patient would like to know if she could have a chest xray done to make sure she does not pneumonia.   Patient is also needing to have labs done for her thyroid and wanted to see if she could do both of these visit the same day

## 2018-12-28 NOTE — Telephone Encounter (Signed)
Is she allowed to come into our office with her symptoms given a negative Covid test? I do agree that a chest xray is needed.  If she can't come into our office for chest xray and labs then needs to go to urgent care. She will have to get labs done once she's better.

## 2018-12-29 MED ORDER — ACCU-CHEK FASTCLIX LANCETS MISC: 5 refills | Status: DC

## 2018-12-29 MED ORDER — LEVOTHYROXINE SODIUM 50 MCG PO TABS: ORAL_TABLET | ORAL | 0 refills | Status: DC

## 2018-12-29 NOTE — Telephone Encounter (Signed)
Spoken to patient and she has been out of work since 12/19/2018. She have mention that it need to stated she had the covid test done. She is hoping to go back to work on Monday 01/02/2019.

## 2018-12-29 NOTE — Telephone Encounter (Signed)
Noted. Completed and placed in Chan's inbox.

## 2018-12-29 NOTE — Telephone Encounter (Signed)
Spoken to patient earlier today and notified patient that due to her symptoms. She is recommended to get the chest x-ray at urgent care. I have also inform her that due her cough, it would be best for a provider to listen to her chest.  Patient verbalized understanding.   Ok to write a note for work since she has been out for almost 2 weeks.

## 2018-12-29 NOTE — Telephone Encounter (Signed)
Spoken to patient and notified that work note left in the front office for pick up.

## 2018-12-29 NOTE — Telephone Encounter (Signed)
Noted. Yes, okay for the work note. Please provide me with the dates and I'll complete.

## 2018-12-29 NOTE — Addendum Note (Signed)
Addended by: Jacqualin Combes on: 12/29/2018 10:08 AM   Modules accepted: Orders

## 2018-12-30 ENCOUNTER — Other Ambulatory Visit: Payer: Self-pay

## 2018-12-30 ENCOUNTER — Encounter (HOSPITAL_COMMUNITY): Payer: Self-pay

## 2018-12-30 ENCOUNTER — Ambulatory Visit (HOSPITAL_COMMUNITY)
Admission: EM | Admit: 2018-12-30 | Discharge: 2018-12-30 | Disposition: A | Discharge status: Home / Self Care | Payer: 59 | Attending: Urgent Care | Admitting: Urgent Care

## 2018-12-30 ENCOUNTER — Ambulatory Visit (INDEPENDENT_AMBULATORY_CARE_PROVIDER_SITE_OTHER): Payer: 59

## 2018-12-30 DIAGNOSIS — R0981 Nasal congestion: Secondary | ICD-10-CM

## 2018-12-30 DIAGNOSIS — R05 Cough: Secondary | ICD-10-CM

## 2018-12-30 DIAGNOSIS — J029 Acute pharyngitis, unspecified: Secondary | ICD-10-CM

## 2018-12-30 DIAGNOSIS — E669 Obesity, unspecified: Secondary | ICD-10-CM

## 2018-12-30 DIAGNOSIS — R059 Cough, unspecified: Secondary | ICD-10-CM

## 2018-12-30 DIAGNOSIS — M546 Pain in thoracic spine: Secondary | ICD-10-CM

## 2018-12-30 DIAGNOSIS — J209 Acute bronchitis, unspecified: Secondary | ICD-10-CM | POA: Diagnosis not present

## 2018-12-30 MED ORDER — PREDNISONE 20 MG PO TABS: ORAL_TABLET | ORAL | 0 refills | Status: DC

## 2018-12-30 NOTE — ED Triage Notes (Signed)
Patient presents to Urgent Care with complaints of runny nose x1 week, cough for 5 days, and sore throat since two days ago. Patient reports she was just tested for COVID and it was negative, throat is still very sore, pt requesting another chest x-ray.

## 2018-12-30 NOTE — ED Provider Notes (Signed)
MRN: [id number] DOB: Aug 25, 1965  Subjective:   Abigail [name] is a 53 y.o. female presenting for 1 week history of progressively worsening malaise.  She has had a persistent dry hacking cough, runny nose and recently started having throat pain.  Patient was in touch with her PCP, they refused to see her in office and have done video visits.  Had negative COVID test last week. Underwent course of azithromycin and levofloxacin with her PCP.  She also got hydrocodone cough syrup on 12/26/2018.  Patient reports no improvement and presents here now for concern that she has a severe infection.  Has a history of allergic rhinitis.  Has been using her albuterol inhaler consistently.    No current facility-administered medications for this encounter.   Current Outpatient Medications:  .  Accu-Chek FastClix Lancets MISC, USE 1 LANCET TO CHECK BLOOD SUGAR THREE TIMES DAILY, Disp: 100 each, Rfl: 5 .  ACCU-CHEK GUIDE test strip, USE 1 STRIP THREE TIMES DAILY, Disp: , Rfl:  .  albuterol (VENTOLIN HFA) 108 (90 Base) MCG/ACT inhaler, Inhale 1-2 puffs into the lungs every 6 (six) hours as needed for wheezing or shortness of breath (cough)., Disp: 18 g, Rfl: 1 .  azithromycin (ZITHROMAX) 250 MG tablet, Take 2 tablets by mouth today, then 1 tablet daily for 4 additional days., Disp: 6 tablet, Rfl: 0 .  benzonatate (TESSALON) 200 MG capsule, Take 1 capsule (200 mg total) by mouth 3 (three) times daily as needed for cough., Disp: 40 capsule, Rfl: 3 .  Blood Glucose Monitoring Suppl (ACCU-CHEK GUIDE) w/Device KIT, by Does not apply route., Disp: , Rfl:  .  chlorpheniramine-HYDROcodone (TUSSIONEX PENNKINETIC ER) 10-8 MG/5ML SUER, Take 5 mLs by mouth at bedtime as needed for cough., Disp: 40 mL, Rfl: 0 .  esomeprazole (NEXIUM) 40 MG capsule, TAKE 1 CAPSULE BY MOUTH DAILY, Disp: 90 capsule, Rfl: 1 .  FLUoxetine (PROZAC) 40 MG capsule, Take 1 capsule (40 mg total) by mouth daily., Disp: 90 capsule, Rfl: 3 .   HYDROcodone-acetaminophen (NORCO) 5-325 MG tablet, Take 1 tablet by mouth every 6 (six) hours as needed for severe pain., Disp: 10 tablet, Rfl: 0 .  levofloxacin (LEVAQUIN) 500 MG tablet, Take 1 tablet (500 mg total) by mouth daily for 10 days., Disp: 10 tablet, Rfl: 0 .  levothyroxine (EUTHYROX) 50 MCG tablet, TAKE 1 TABLET BY MOUTH ONCE DAILY IN THE MORNING ON AN EMPTY STOMACH WITH  WATER  .  NO  FOOD  OR  OTHER  MEDICATIONS  FOR  30  MINUTES., Disp: 30 tablet, Rfl: 0 .  levothyroxine (SYNTHROID) 50 MCG tablet, Take 50 mcg by mouth daily before breakfast., Disp: , Rfl:  .  linaclotide (LINZESS) 72 MCG capsule, TAKE 1 CAPSULE BY MOUTH ONCE DAILY BEFORE BREAKFAST, Disp: 90 capsule, Rfl: 3 .  meloxicam (MOBIC) 15 MG tablet, Take 15 mg by mouth every morning., Disp: , Rfl:  .  metFORMIN (GLUCOPHAGE) 500 MG tablet, Take 1 tablet (500 mg total) by mouth 2 (two) times daily with a meal. For diabetes., Disp: 180 tablet, Rfl: 1 .  methocarbamol (ROBAXIN) 500 MG tablet, Take 500 mg by mouth 3 (three) times daily. , Disp: , Rfl:  .  metoprolol tartrate (LOPRESSOR) 25 MG tablet, Take 1 tablet by mouth twice daily, Disp: 60 tablet, Rfl: 5 .  ondansetron (ZOFRAN ODT) 8 MG disintegrating tablet, Take 1 tablet (8 mg total) by mouth every 8 (eight) hours as needed for nausea or vomiting., Disp: 20 tablet, Rfl:  1 .  oxybutynin (DITROPAN-XL) 5 MG 24 hr tablet, Take 5 mg by mouth daily., Disp: , Rfl: 1 .  Peppermint Oil (IBGARD PO), Take by mouth., Disp: , Rfl:  .  rosuvastatin (CRESTOR) 20 MG tablet, Take 20 mg by mouth daily., Disp: , Rfl:  .  traMADol (ULTRAM) 50 MG tablet, Take 50 mg by mouth every morning. , Disp: , Rfl:    Allergies  Allergen Reactions  . Penicillins Nausea Only    Has patient had a PCN reaction causing immediate rash, facial/tongue/throat swelling, SOB or lightheadedness with hypotension: No Has patient had a PCN reaction causing severe rash involving mucus membranes or skin necrosis:  No Has patient had a PCN reaction that required hospitalization: No Has patient had a PCN reaction occurring within the last 10 years: No If all of the above answers are "NO", then may proceed with Cephalosporin use.   . Latex Rash    Past Medical History:  Diagnosis Date  . Anxiety   . Bulging lumbar disc   . CAP (community acquired pneumonia)   . Cardiac arrhythmia due to congenital heart disease    no arrhythmia identified   . Celiac disease   . Depression   . Diverticulosis    Sigmoid colon  . Fatty liver 05/2016  . Frequent headaches   . GERD (gastroesophageal reflux disease)   . Heart disease   . History of kidney stones   . Hypertension   . Kidney stones   . Migraine   . Obesity (BMI 35.0-39.9 without comorbidity)   . UTI (urinary tract infection)      Past Surgical History:  Procedure Laterality Date  . ABDOMINAL EXPLORATION SURGERY  1986  . COLONOSCOPY  06/2016  . EXTRACORPOREAL SHOCK WAVE LITHOTRIPSY Left 12/03/2016   Procedure: LEFT EXTRACORPOREAL SHOCK WAVE LITHOTRIPSY (ESWL);  Surgeon: Irine Seal, MD;  Location: [facility];  Service: Urology;  Laterality: Left;  . KIDNEY STONE SURGERY    . LEFT HEART CATH AND CORONARY ANGIOGRAPHY  07/28/2016    Large tortuous coronary arteries no radiographic evidence of disease.  Not anginal chest pain.  Normal EF.  [name] LITHOTRIPSY    . NASAL SINUS SURGERY    . ROTATOR CUFF REPAIR Right 12/27/2017  . TRANSTHORACIC ECHOCARDIOGRAM  06/2017   EF 60-65%.  GR 1-2 DD.  Otherwise normal echo.  Normal valve function.  Normal wall motion.  . TUBAL LIGATION      Review of Systems  Constitutional: Positive for malaise/fatigue. Negative for fever.  HENT: Positive for congestion and sore throat. Negative for ear pain and sinus pain.   Eyes: Negative for discharge and redness.  Respiratory: Positive for cough. Negative for hemoptysis, shortness of breath and wheezing.   Cardiovascular: Negative for chest pain.  Gastrointestinal:  Negative for abdominal pain, diarrhea, nausea and vomiting.  Genitourinary: Negative for dysuria, flank pain and hematuria.  Musculoskeletal: Positive for back pain (Bilateral mid thoracic/lateral back pain with coughing). Negative for myalgias.  Skin: Negative for rash.  Neurological: Negative for dizziness, weakness and headaches.  Psychiatric/Behavioral: Negative for depression and substance abuse.    Social History   Tobacco Use  . Smoking status: Former Smoker    Types: Cigarettes    Quit date: 2008    Years since quitting: 12.8  . Smokeless tobacco: Never Used  . Tobacco comment: used tobacco "socially"  Substance Use Topics  . Alcohol use: No    Alcohol/week: 0.0 standard drinks  . Drug use: No  Family History  Problem Relation Age of Onset  . COPD Father   . Emphysema Father   . Gallstones Mother   . Colon cancer Neg Hx   . Stomach cancer Neg Hx   . Esophageal cancer Neg Hx      Objective:   Vitals: BP 126/65 (BP Location: Left Arm)   Pulse 85   Temp 99.2 F (37.3 C) (Oral)   Resp 16   LMP 08/12/2015 Comment: patient states hx. tubal ligation  SpO2 100%   Physical Exam Constitutional:      General: She is not in acute distress.    Appearance: Normal appearance. She is well-developed. She is not ill-appearing, toxic-appearing or diaphoretic.  HENT:     Head: Normocephalic and atraumatic.     Nose: Nose normal.     Mouth/Throat:     Mouth: Mucous membranes are moist.  Eyes:     General: No scleral icterus.       Right eye: No discharge.        Left eye: No discharge.     Extraocular Movements: Extraocular movements intact.     Pupils: Pupils are equal, round, and reactive to light.  Neck:     Musculoskeletal: Normal range of motion and neck supple.  Cardiovascular:     Rate and Rhythm: Normal rate and regular rhythm.     Pulses: Normal pulses.     Heart sounds: Normal heart sounds. No murmur. No friction rub. No gallop.   Pulmonary:      Effort: Pulmonary effort is normal. No respiratory distress.     Breath sounds: Normal breath sounds. No stridor. No wheezing, rhonchi or rales.  Musculoskeletal: Normal range of motion.     Right lower leg: No edema.     Left lower leg: No edema.  Lymphadenopathy:     Cervical: No cervical adenopathy.  Skin:    General: Skin is warm and dry.     Findings: No rash.  Neurological:     Mental Status: She is alert and oriented to person, place, and time.     Cranial Nerves: No cranial nerve deficit.     Motor: No weakness.     Coordination: Coordination normal.  Psychiatric:        Mood and Affect: Mood normal.        Behavior: Behavior normal.        Thought Content: Thought content normal.     Dg Chest 2 View  Result Date: 12/30/2018 CLINICAL DATA:  Cough. EXAM: CHEST - 2 VIEW COMPARISON:  11/10/2018 FINDINGS: The cardiomediastinal silhouette is unremarkable. Mild chronic peribronchial thickening is noted. There is no evidence of focal airspace disease, pulmonary edema, suspicious pulmonary nodule/mass, pleural effusion, or pneumothorax. No acute bony abnormalities are identified. IMPRESSION: 1. No evidence of acute cardiopulmonary disease. 2. Mild chronic peribronchial thickening. Electronically Signed   By: Margarette Canada M.D.   On: 12/30/2018 12:27    Assessment and Plan :   1. Acute bronchitis, unspecified organism   2. Cough   3. Nasal congestion   4. Sore throat   5. Acute bilateral thoracic back pain   6. Obesity, unspecified classification, unspecified obesity type, unspecified whether serious comorbidity present     Suspect moderate to severe allergic rhinitis and therefore will use of prednisone course to address this.  Patient was very worried about her lungs and the cough and I reassured her that prednisone course could help with this as well.  She is  to maintain and schedule her albuterol inhaler.  Counseled that she can continue levofloxacin as prescribed by her PCP  but chest x-ray does not demonstrate any acute cardiopulmonary disease.  Counseled her on potential for side effects with levofloxacin, recommended she follow-up with her PCP to see if she should maintain this. Counseled patient on potential for adverse effects with medications prescribed/recommended today, ER and return-to-clinic precautions discussed, patient verbalized understanding.    Jaynee Eagles, PA-C 12/30/18 1248

## 2019-01-03 ENCOUNTER — Other Ambulatory Visit: Payer: Self-pay | Admitting: Cardiology

## 2019-01-04 ENCOUNTER — Telehealth: Payer: Self-pay | Admitting: Primary Care

## 2019-01-04 NOTE — Telephone Encounter (Signed)
Patient stated that she had another sugar drop today. She stated that she was on her way to work and had to turn around to go back home. She checked her sugar and it was 93.  Patient stated that @ 2:30 it was 197 and 3:00pm it was 194. She did not say if this was fasting or non fasting.  C/B # [phone]

## 2019-01-04 NOTE — Telephone Encounter (Signed)
Please thank patient for the information, a blood sugar of 93 is great! Have her continue metformin as prescribed, continue to work on diet and exercise.  I am happy to see her anytime if she would like to further discuss. Otherwise, I need to see her for follow-up on or after March 01, 2019.

## 2019-01-05 NOTE — Telephone Encounter (Signed)
Spoken and notified patient of Kate Clark's comments. Patient verbalized understanding.  

## 2019-01-14 ENCOUNTER — Other Ambulatory Visit: Payer: Self-pay | Admitting: Primary Care

## 2019-01-17 ENCOUNTER — Encounter

## 2019-01-18 ENCOUNTER — Encounter: Payer: 59 | Attending: Primary Care | Admitting: Registered"

## 2019-01-18 ENCOUNTER — Other Ambulatory Visit: Payer: Self-pay

## 2019-01-18 ENCOUNTER — Encounter: Payer: Self-pay | Admitting: Registered"

## 2019-01-18 DIAGNOSIS — E119 Type 2 diabetes mellitus without complications: Secondary | ICD-10-CM

## 2019-01-18 NOTE — Progress Notes (Signed)
Diabetes Self-Management Education  Visit Type: First/Initial  Appt. Start Time: 0900 Appt. End Time: 1030  01/18/2019  Abigail [name], identified by name and date of birth, is a 53 y.o. female with a diagnosis of Diabetes: Type 2.   ASSESSMENT  Last menstrual period 08/12/2015. There is no height or weight on file to calculate BMI.   Pt states she understands diabetes can have serious consequences and doesn't know much about how to manage and very motivated, states she wants to be around for her children and 5 grand kids.   Pt states she has been making adjustments to her diet, still eating many of the same foods, but in moderation. Pt reports when she eats breakfast feels full during the day and will just have some snacks at work. Pt also reports she is concerned about COVID and the space does not feel sanitary to have much food out.  SMBG: patient was checking 3x/day but stopped checking end of Oct d/t sore fingers. RD provided tips to help address.  Medications: Pt states she has increased metformin as directed by MD. Pt states she does not have follow-up appointment set up with MD. Pt states since diagnosis has been paying attention to other people's experience with diabetes and doesn't want to have to take insulin.   Pt states her supervisor also has diabetes and has told her not to drive a couple of times when she call d/t hypoglycemic symptoms on way to work. Pt did not check BG during these episodes but ate [employer] Fish meal deal with ~8 oz reg soda to feel better and kept eating sweet food until she felt better. After she rested awhile she check BG and it was 297 mg/dL then felt very tired and slept hard.  Pt reports extreme tiredness, states her MD has adjusted her thyroid medication and thinks her body is still adjusting. 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 Pt reports a few times lately she has had  carmel mocha frappes at work to get a boost.  Pt states after she gets off work takes care of dog eats a little something and goes to sleep 5:30- 6 pm, wakes up ~12:00 am has a snack and goes back to sleep. Gets up 5:45 am to take thyroid med feels like she needs a nap during day but with new work schedule cannot. Pt states on days off she has no energy, will sleep in recliner all day.  Physical activity: ADLs. Pt states she has been getting mixes messages about what exercise is best for her back issues with bulging disk and fracture.   Next appt patient wants to discuss more detail about food and have ideas for meal prepping. RD may consider carb counting next visit.   Diabetes Self-Management Education - 01/18/19 0917      Visit Information   Visit Type  First/Initial      Initial Visit   Diabetes Type  Type 2    Are you currently following a meal plan?  No    Are you taking your medications as prescribed?  Yes   metformin 500 bid   Date Diagnosed  Aug 2020      Health Coping   How would you rate your overall health?  Good      Psychosocial Assessment   Patient Belief/Attitude about Diabetes  Afraid    How often do you need to have someone help you  when you read instructions, pamphlets, or other written materials from your doctor or pharmacy?  1 - Never    What is the last grade level you completed in school?  10      Complications   Last HgB A1C per patient/outside source  8.7 %    How often do you check your blood sugar?  0 times/day (not testing)   was testing 3x/day fingers got sore   Fasting Blood glucose range (mg/dL)  >200;70-129   93-297   Number of hypoglycemic episodes per month  2    Can you tell when your blood sugar is low?  Yes   didn't check BG, just believe it was low d/t symptoms   What do you do if your blood sugar is low?  eat something    Number of hyperglycemic episodes per week  --   daily   Have you had a dilated eye exam in the past 12 months?  No     Have you had a dental exam in the past 12 months?  No   has appt Dec 11   Are you checking your feet?  No      Dietary Intake   Breakfast  biscuit, gravy OR 2 pieced bacon, eggs, cheese, sugar-free whole wheat toast, choc milk ~10 oz    Snack (morning)  none    Lunch  dole manderane oranges, pudding, jello, crackers    Snack (afternoon)  cheddar pretzels    Dinner  cereal OR soup OR chicken tenders, bbq sauce, 1/4 c potato salad, corn,    Snack (evening)  none    Beverage(s)  water, 8 oz soda (keeps for low blood sugar, but drinks other times too), occassionally unsweet iced tea, apple juice, V8 juice, choc milk,      Exercise   Exercise Type  ADL's    How many days per week to you exercise?  0    How many minutes per day do you exercise?  0    Total minutes per week of exercise  0      Patient Education   Previous Diabetes Education  No    Nutrition management   Role of diet in the treatment of diabetes and the relationship between the three main macronutrients and blood glucose level    Medications  Reviewed patients medication for diabetes, action, purpose, timing of dose and side effects.   discussed metformin and SGLT2 incase that is added   Monitoring  Identified appropriate SMBG and/or A1C goals.    Acute complications  Taught treatment of hypoglycemia - the 15 rule.      Individualized Goals (developed by patient)   Nutrition  General guidelines for healthy choices and portions discussed    Physical Activity  Exercise 1-2 times per week    Monitoring   test my blood glucose as discussed      Outcomes   Expected Outcomes  Demonstrated interest in learning. Expect positive outcomes    Future DMSE  4-6 wks    Program Status  Not Completed       Individualized Plan for Diabetes Self-Management Training:   Learning Objective:  Patient will have a greater understanding of diabetes self-management. Patient education plan is to attend individual and/or group sessions per  assessed needs and concerns.   Patient Instructions  Be sure to follow up with your MD. Consider checking your blood sugar fasting and 2 hours after your largest meal and at bedtime for 1  week before your next appointment with MD and take log with you. Consider signing up for the [facility] program Consider practicing identifying carbohydrates in your meals and snacks and balancing your plate with non-starchy vegetables. Aim to drink more water. Consider going to the [facility] or walking your dog in the neighborhood on your days off.   Expected Outcomes:  Demonstrated interest in learning. Expect positive outcomes  Education material provided: ADA - How to Thrive: A Guide for Your Journey with Diabetes, A1C conversion sheet, My Plate and Snack sheet  If problems or questions, patient to contact team via:  Phone and MyChart  Future DSME appointment: 4-6 wks

## 2019-01-18 NOTE — Patient Instructions (Addendum)
Be sure to follow up with your MD. Consider checking your blood sugar fasting and 2 hours after your largest meal and at bedtime for 1 week before your next appointment with MD and take log with you. Consider signing up for the [facility] program Consider practicing identifying carbohydrates in your meals and snacks and balancing your plate with non-starchy vegetables. Aim to drink more water. Consider going to the [facility] or walking your dog in the neighborhood on your days off.

## 2019-01-31 ENCOUNTER — Other Ambulatory Visit: Payer: Self-pay | Admitting: Primary Care

## 2019-01-31 ENCOUNTER — Ambulatory Visit: Payer: 59 | Admitting: Cardiology

## 2019-01-31 DIAGNOSIS — E034 Atrophy of thyroid (acquired): Secondary | ICD-10-CM

## 2019-02-13 ENCOUNTER — Ambulatory Visit (INDEPENDENT_AMBULATORY_CARE_PROVIDER_SITE_OTHER): Payer: 59 | Admitting: Cardiology

## 2019-02-13 ENCOUNTER — Other Ambulatory Visit: Payer: Self-pay

## 2019-02-13 VITALS — BP 139/84 | HR 76 | Ht 62.0 in | Wt 203.0 lb

## 2019-02-13 DIAGNOSIS — R002 Palpitations: Secondary | ICD-10-CM | POA: Diagnosis not present

## 2019-02-13 DIAGNOSIS — E7849 Other hyperlipidemia: Secondary | ICD-10-CM | POA: Diagnosis not present

## 2019-02-13 DIAGNOSIS — I493 Ventricular premature depolarization: Secondary | ICD-10-CM

## 2019-02-13 DIAGNOSIS — E668 Other obesity: Secondary | ICD-10-CM

## 2019-02-13 NOTE — Progress Notes (Signed)
Primary Care Provider: [name], NP Cardiologist: Glenetta Hew, MD Electrophysiologist:   Clinic Note: Chief Complaint  Patient presents with  . Follow-up    Stable musculoskeletal type chest pains.  Otherwise no complaints    HPI:    Abigail [name] is a 53 y.o. female with a PMH below who presents today for 10 month f/u.   Abigail [name] was last seen in Feb  Recent Hospitalizations:   [facility] 12/30/2018: 1 week history of malaise, dry hacking cough, runny nose and throat pain.  Negative Covid.  This was following azithromycin and Levaquin.  No improvement. ->  Given course of prednisone.  Reviewed  CV studies:    The following studies were reviewed today: (if available, images/films reviewed: From Epic Chart or Care Everywhere) . n/a:   Interval History:   Abigail [name] presents here today for follow-up really starting to feel a bit better now after her off-and-on bouts of bronchitis and URI symptoms.  She notes that about a week ago she had an episode of sharp chest pain there was In the left upper chest.  This occurred when she was lying down.  She then set up took some deep breaths and and and got up to walk around and it got better.  She is felt some other symptoms of similar type of chest discomfort off and on but less with walking.  She has occasional episodes of feeling her heart rate going up or having palpitations and she says that over the last month or so with her respiratory issues her blood pressures been going up.  But she has not had any significant episodes of exertional or resting chest pain or pressure.  She always has some exertional dyspnea but not significant.  No PND, orthopnea or edema.   CV Review of Symptoms (Summary) : positive for - chest pain, dyspnea on exertion and As noted above, but baseline dyspnea and intermittent atypical type chest pain symptoms.;  Intermittent heart palpitations and fast heart  rates negative for - edema, orthopnea, paroxysmal nocturnal dyspnea, shortness of breath or Syncope/near syncope, TIA/amaurosis fugax.  The patient does not have symptoms concerning for COVID-19 infection (fever, chills, cough, or new shortness of breath).  The patient is practicing social distancing. ++ Masking.  She does have to go out for groceries/shopping, but sparingly.   REVIEWED OF SYSTEMS   A comprehensive ROS was performed. Review of Systems  Constitutional: Negative for malaise/fatigue (Not a lot of energy but no malaise/fatigue) and weight loss.  HENT: Positive for congestion and sore throat. Negative for hearing loss and nosebleeds.   Respiratory: Positive for cough and shortness of breath (With exertion.  See above). Negative for sputum production.   Cardiovascular: Positive for chest pain (Off-and-on chest pains on both left upper and right upper chest). Negative for claudication.  Gastrointestinal: Positive for heartburn. Negative for abdominal pain, blood in stool and melena.  Genitourinary: Negative for hematuria.  Musculoskeletal: Negative for falls, joint pain and myalgias (Only when she is not feeling well).  Neurological: Positive for dizziness. Negative for weakness and headaches.  Psychiatric/Behavioral: Negative for depression and memory loss. The patient is nervous/anxious and has insomnia.   All other systems reviewed and are negative.  I have reviewed and (if needed) personally updated the patient's problem list, medications, allergies, past medical and surgical history, social and family history.   PAST MEDICAL HISTORY   Past Medical History:  Diagnosis Date  .  Anxiety   . Bulging lumbar disc   . CAP (community acquired pneumonia)   . Cardiac arrhythmia due to congenital heart disease    no arrhythmia identified   . Celiac disease   . Depression   . Diverticulosis    Sigmoid colon  . Fatty liver 05/2016  . Frequent headaches   . GERD  (gastroesophageal reflux disease)   . Heart disease   . History of kidney stones   . Hypertension   . Kidney stones   . Migraine   . Obesity (BMI 35.0-39.9 without comorbidity)   . UTI (urinary tract infection)      PAST SURGICAL HISTORY   Past Surgical History:  Procedure Laterality Date  . ABDOMINAL EXPLORATION SURGERY  1986  . COLONOSCOPY  06/2016  . EXTRACORPOREAL SHOCK WAVE LITHOTRIPSY Left 12/03/2016   Procedure: LEFT EXTRACORPOREAL SHOCK WAVE LITHOTRIPSY (ESWL);  Surgeon: Irine Seal, MD;  Location: [facility];  Service: Urology;  Laterality: Left;  . KIDNEY STONE SURGERY    . LEFT HEART CATH AND CORONARY ANGIOGRAPHY  07/28/2016    Large tortuous coronary arteries no radiographic evidence of disease.  Not anginal chest pain.  Normal EF.  [name] LITHOTRIPSY    . NASAL SINUS SURGERY    . ROTATOR CUFF REPAIR Right 12/27/2017  . TRANSTHORACIC ECHOCARDIOGRAM  06/2017   EF 60-65%.  GR 1-2 DD.  Otherwise normal echo.  Normal valve function.  Normal wall motion.  . TUBAL LIGATION       MEDICATIONS/ALLERGIES   Current Meds  Medication Sig  . Accu-Chek FastClix Lancets MISC USE 1 LANCET TO CHECK BLOOD SUGAR THREE TIMES DAILY  . ACCU-CHEK GUIDE test strip USE 1 STRIP   THREE TIMES DAILY  . albuterol (VENTOLIN HFA) 108 (90 Base) MCG/ACT inhaler Inhale 1-2 puffs into the lungs every 6 (six) hours as needed for wheezing or shortness of breath (cough).  [name] azithromycin (ZITHROMAX) 250 MG tablet Take 2 tablets by mouth today, then 1 tablet daily for 4 additional days.  . benzonatate (TESSALON) 200 MG capsule Take 1 capsule (200 mg total) by mouth 3 (three) times daily as needed for cough.  . Blood Glucose Monitoring Suppl (ACCU-CHEK GUIDE) w/Device KIT by Does not apply route.  . chlorpheniramine-HYDROcodone (TUSSIONEX PENNKINETIC ER) 10-8 MG/5ML SUER Take 5 mLs by mouth at bedtime as needed for cough.  . esomeprazole (NEXIUM) 40 MG capsule TAKE 1 CAPSULE BY MOUTH DAILY  . EUTHYROX 50 MCG  tablet TAKE 1 TABLET BY MOUTH ONCE DAILY IN THE MORNING ON AN EMPTY STOMACH WITH WATER. NO FOOD OR OTHER MEDICATIONS FOR 30 MINUTES.  [name] FLUoxetine (PROZAC) 40 MG capsule Take 1 capsule (40 mg total) by mouth daily.  [name] HYDROcodone-acetaminophen (NORCO) 5-325 MG tablet Take 1 tablet by mouth every 6 (six) hours as needed for severe pain.  [name] levothyroxine (SYNTHROID) 50 MCG tablet Take 50 mcg by mouth daily before breakfast.  . linaclotide (LINZESS) 72 MCG capsule TAKE 1 CAPSULE BY MOUTH ONCE DAILY BEFORE BREAKFAST  . meloxicam (MOBIC) 15 MG tablet Take 15 mg by mouth every morning.  . metFORMIN (GLUCOPHAGE) 500 MG tablet Take 1 tablet (500 mg total) by mouth 2 (two) times daily with a meal. For diabetes.  . methocarbamol (ROBAXIN) 500 MG tablet Take 500 mg by mouth 3 (three) times daily.   . metoprolol tartrate (LOPRESSOR) 25 MG tablet Take 1 tablet by mouth twice daily  . ondansetron (ZOFRAN ODT) 8 MG disintegrating tablet Take 1 tablet (8 mg  total) by mouth every 8 (eight) hours as needed for nausea or vomiting.  [name] oxybutynin (DITROPAN-XL) 5 MG 24 hr tablet Take 5 mg by mouth daily.  [name] Peppermint Oil (IBGARD PO) Take by mouth.  . predniSONE (DELTASONE) 20 MG tablet Take 2 tablets daily with breakfast.  . rosuvastatin (CRESTOR) 20 MG tablet Take 20 mg by mouth daily.  . traMADol (ULTRAM) 50 MG tablet Take 50 mg by mouth every morning.     Allergies  Allergen Reactions  . Penicillins Nausea Only    Has patient had a PCN reaction causing immediate rash, facial/tongue/throat swelling, SOB or lightheadedness with hypotension: No Has patient had a PCN reaction causing severe rash involving mucus membranes or skin necrosis: No Has patient had a PCN reaction that required hospitalization: No Has patient had a PCN reaction occurring within the last 10 years: No If all of the above answers are "NO", then may proceed with Cephalosporin use.   . Latex Rash     SOCIAL HISTORY/FAMILY HISTORY    Social History   Tobacco Use  . Smoking status: Former Smoker    Types: Cigarettes    Quit date: 2008    Years since quitting: 12.9  . Smokeless tobacco: Never Used  . Tobacco comment: used tobacco "socially"  Substance Use Topics  . Alcohol use: No    Alcohol/week: 0.0 standard drinks  . Drug use: No   Social History   Social History Narrative   Single.   Has a set of Twins, age 43.   Works for the [employer] and [employer] and [employer]   Enjoys relaxing, spending time with her children.    Family History family history includes COPD in her father; Emphysema in her father; Gallstones in her mother.   OBJCTIVE -PE, EKG, labs   Wt Readings from Last 3 Encounters:  02/13/19 203 lb (92.1 kg)  12/26/18 203 lb (92.1 kg)  11/29/18 204 lb (92.5 kg)    Physical Exam: BP 139/84   Pulse 76   Ht 5' 2"  (1.575 m)   Wt 203 lb (92.1 kg)   LMP 08/12/2015 Comment: patient states hx. tubal ligation  SpO2 98%   BMI 37.13 kg/m  Physical Exam  Constitutional: She is oriented to person, place, and time. She appears well-developed. No distress.  Moderate-severely obese.  Well-groomed.  HENT:  Head: Normocephalic and atraumatic.  Neck: Normal range of motion. Neck supple. No hepatojugular reflux and no JVD present. Carotid bruit is not present.  Cardiovascular: Normal rate, regular rhythm and normal heart sounds.  Occasional extrasystoles are present. PMI is not displaced (Cannot palpate). Exam reveals no gallop, no friction rub and no decreased pulses.  No murmur heard. Pulmonary/Chest: Effort normal and breath sounds normal. No respiratory distress. She has no wheezes. She exhibits tenderness (Left chest top left of sternum).  Abdominal: Soft. Bowel sounds are normal. She exhibits no distension. There is no abdominal tenderness. There is no rebound.  Obese.  No HSM.  Musculoskeletal: Normal range of motion.        General: No edema (Trivial).  Neurological: She is alert and  oriented to person, place, and time.  Psychiatric: She has a normal mood and affect. Her behavior is normal. Judgment and thought content normal.  Vitals reviewed.   Adult ECG Report  Rate: 74;  Rhythm: normal sinus rhythm and , Exclude anterior MI, age undetermined.  Otherwise normal axis, intervals and durations.;   Narrative Interpretation: Stable EKG  Recent  Labs:    Lab Results  Component Value Date   CHOL 148 11/30/2018   HDL 26.20 (L) 11/30/2018   LDLCALC 89 11/30/2018   TRIG 165.0 (H) 11/30/2018   CHOLHDL 6 11/30/2018   Lab Results  Component Value Date   CREATININE 0.65 11/30/2018   BUN 10 11/30/2018   NA 138 11/30/2018   K 4.5 11/30/2018   CL 98 11/30/2018   CO2 29 11/30/2018    ASSESSMENT/PLAN    Problem List Items Addressed This Visit    Moderate obesity (Chronic)    The patient understands the need to lose weight with diet and exercise. We have discussed specific strategies for this.  Really needs to work on increased exercise and continue p.o. adjustments.      Palpitations (Chronic)    She still has palpitations, pretty stable.  Again on beta-blocker.  Okay to take extra dose as needed for bad spells.  He does not sound that she is had any A. fib or SVT.  None of the symptoms are long-lasting.      Relevant Orders   EKG 12-Lead (Completed)   PVC's (premature ventricular contractions) - Primary (Chronic)    Pretty well controlled with current dose of Lopressor.  We talked about possibly using additional dose as needed for worsening palpitations.      Relevant Orders   EKG 12-Lead (Completed)   Hyperlipidemia due to dietary fat intake (Chronic)    Lipid panel shows LDL of 89 which given her risk factors is relatively stable.  Discussed healthy diet and increase exercise to avoid further complications.  Probably at goal for her on current dose of rosuvastatin.  No change.          COVID-19 Education: The signs and symptoms of COVID-19 were  discussed with the patient and how to seek care for testing (follow up with PCP or arrange E-visit).   The importance of social distancing was discussed today.  I spent a total of 16 minutes with the patient and chart review. >  50% of the time was spent in direct patient consultation.  Additional time spent with chart review (studies, outside notes, etc): 6 Total Time: 22 min   Current medicines are reviewed at length with the patient today.  (+/- concerns) n/a   Patient Instructions / Medication Changes & Studies & Tests Ordered   Patient Instructions  Medication Instructions:  No changes *If you need a refill on your cardiac medications before your next appointment, please call your pharmacy*  Lab Work: Not needed If you have labs (blood work) drawn today and your tests are completely normal, you will receive your results only by: [name] MyChart Message (if you have MyChart) OR . A paper copy in the mail If you have any lab test that is abnormal or we need to change your treatment, we will call you to review the results.  Testing/Procedures: Not needed  Follow-Up: At [facility], you and your health needs are our priority.  As part of our continuing mission to provide you with exceptional heart care, we have created designated Provider Care Teams.  These Care Teams include your primary Cardiologist (physician) and Advanced Practice Providers (APPs -  Physician Assistants and Nurse Practitioners) who all work together to provide you with the care you need, when you need it.  Your next appointment:   12 month(s)  The format for your next appointment:   In Person  Provider:   Glenetta Hew, MD  Other  Instructions     Studies Ordered:   Orders Placed This Encounter  Procedures  . EKG 12-Lead     Glenetta Hew, M.D., M.S. Interventional Cardiologist   Pager # [phone] Phone # [phone] [address] [place], [place] [address]   Thank you for  choosing Heartcare at [facility]!!

## 2019-02-13 NOTE — Patient Instructions (Signed)
Medication Instructions:  No changes *If you need a refill on your cardiac medications before your next appointment, please call your pharmacy*  Lab Work: Not needed If you have labs (blood work) drawn today and your tests are completely normal, you will receive your results only by: [name] MyChart Message (if you have MyChart) OR . A paper copy in the mail If you have any lab test that is abnormal or we need to change your treatment, we will call you to review the results.  Testing/Procedures: Not needed  Follow-Up: At [facility], you and your health needs are our priority.  As part of our continuing mission to provide you with exceptional heart care, we have created designated Provider Care Teams.  These Care Teams include your primary Cardiologist (physician) and Advanced Practice Providers (APPs -  Physician Assistants and Nurse Practitioners) who all work together to provide you with the care you need, when you need it.  Your next appointment:   12 month(s)  The format for your next appointment:   In Person  Provider:   Glenetta Hew, MD  Other Instructions

## 2019-02-15 ENCOUNTER — Encounter: Payer: Self-pay | Admitting: Cardiology

## 2019-02-15 NOTE — Assessment & Plan Note (Signed)
The patient understands the need to lose weight with diet and exercise. We have discussed specific strategies for this.  Really needs to work on increased exercise and continue p.o. adjustments.

## 2019-02-15 NOTE — Assessment & Plan Note (Addendum)
Lipid panel shows LDL of 89 which given her risk factors is relatively stable.  Discussed healthy diet and increase exercise to avoid further complications.  Probably at goal for her on current dose of rosuvastatin.  No change.

## 2019-02-15 NOTE — Assessment & Plan Note (Signed)
Minimal disease on cath in the past.  Has recurrent symptoms that are relatively likely to be cardiac in nature.  She is on a beta-blocker and low-dose statin.  Follow-up for any significant anginal symptoms.

## 2019-02-15 NOTE — Assessment & Plan Note (Signed)
Pretty well controlled with current dose of Lopressor.  We talked about possibly using additional dose as needed for worsening palpitations.

## 2019-02-15 NOTE — Assessment & Plan Note (Signed)
She still has palpitations, pretty stable.  Again on beta-blocker.  Okay to take extra dose as needed for bad spells.  He does not sound that she is had any A. fib or SVT.  None of the symptoms are long-lasting.

## 2019-02-23 ENCOUNTER — Encounter: Payer: Self-pay | Admitting: Registered"

## 2019-02-23 ENCOUNTER — Encounter: Payer: 59 | Attending: Primary Care | Admitting: Registered"

## 2019-02-23 ENCOUNTER — Other Ambulatory Visit: Payer: Self-pay

## 2019-02-23 DIAGNOSIS — E119 Type 2 diabetes mellitus without complications: Secondary | ICD-10-CM | POA: Insufficient documentation

## 2019-02-23 DIAGNOSIS — E1165 Type 2 diabetes mellitus with hyperglycemia: Secondary | ICD-10-CM

## 2019-02-23 NOTE — Patient Instructions (Addendum)
Good work on changing how you treat low blood sugar so you are not over treating.  Because you are interested in reading labels, start with just focusing on grams of carbohydrates. Remember that sugar alcohols can be subtracted from the total to know what will affect your blood sugar.  Next visit we will focus on fluid retention. Keep a food log for 3 days before your next visit.

## 2019-02-23 NOTE — Progress Notes (Signed)
Diabetes Self-Management Education  Visit Type: Follow-up  Appt. Start Time: 1645 Appt. End Time: 1540  02/23/2019  [name], identified by name and date of birth, is a 53 y.o. female with a diagnosis of Diabetes:  .   ASSESSMENT  Last menstrual period 08/12/2015. There is no height or weight on file to calculate BMI.  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  Pt states because she learned how to treat low blood sugar last visit, she doesn't get as panicked when feeling hypoglycemic episodes coming on. Instead of eating sweet foods until she feels better, now will drink small amount of soda (last time at some candy as well) and patiently wait about 15 min and she has found that works.  SMBG: Patient states she is not testing regularly. She reports she recently tested BG because she felt weird awhile after eating a McRib sandwich and was wondering if BG was dropping but it was ~100 mg/dL. Pt states the other time she tested her BG was 1 hr after eating 1 Biscuit and gravy and 1 hr later 102 mg/dL. Pt states her heart doctor wants her to have her blood sugar around 100. Patient is frustrated about BG goal because she is aware that some of medications increase her BG.  Pt states she has all her doctors telling her she needs to lose weight and is getting very frustrated and anxious because she doesn't know how to lose weight. Pt states she is reading labels and it is overwhelming and frustrating with all the nutritional claims on the package and when advertises no-sugar on the label but still has sugar on the nutrition facts label.  Patient states her weight fluctuates and c/o of what sounds like fluid retention. RD asked patient to keep a 3-day food log before next visit so we can focus on what might be contributing to her fluid  retention.  Diabetes Self-Management Education - 02/23/19 1741      Visit Information   Visit Type  Follow-up      Complications   Can you tell when your blood sugar is low?  Yes    What do you do if your blood sugar is low?  drink sip of soda, eat candy wait 15 min      Patient Education   Nutrition management   Food label reading, portion sizes and measuring food.      Individualized Goals (developed by patient)   Nutrition  General guidelines for healthy choices and portions discussed;Other (comment)   have protein with breakfast   Reducing Risk  treat hypoglycemia with 15 grams of carbs if blood glucose less than 68m/dL      Outcomes   Expected Outcomes  Demonstrated interest in learning. Expect positive outcomes    Future DMSE  4-6 wks    Program Status  Not Completed      Subsequent Visit   Since your last visit have you experienced any weight changes?  --   fluctuates up and down   Since your last visit, are you checking your blood glucose at least once a day?  No       Individualized Plan for Diabetes Self-Management Training:   Learning Objective:  Patient will have a greater understanding of diabetes self-management. Patient education plan is to attend individual and/or  group sessions per assessed needs and concerns.   Patient Instructions  Good work on changing how you treat low blood sugar so you are not over treating.  Because you are interested in reading labels, start with just focusing on grams of carbohydrates. Remember that sugar alcohols can be subtracted from the total to know what will affect your blood sugar.  Next visit we will focus on fluid retention. Keep a food log for 3 days before your next visit.   Expected Outcomes:  Demonstrated interest in learning. Expect positive outcomes  Education material provided: none  If problems or questions, patient to contact team via:  Phone and MyChart  Future DSME appointment: 4-6 wks

## 2019-02-28 ENCOUNTER — Other Ambulatory Visit: Payer: Self-pay | Admitting: Primary Care

## 2019-02-28 ENCOUNTER — Other Ambulatory Visit: Payer: Self-pay | Admitting: Cardiology

## 2019-02-28 DIAGNOSIS — E034 Atrophy of thyroid (acquired): Secondary | ICD-10-CM

## 2019-03-01 ENCOUNTER — Encounter (HOSPITAL_COMMUNITY): Payer: Self-pay | Admitting: Emergency Medicine

## 2019-03-01 ENCOUNTER — Other Ambulatory Visit: Payer: Self-pay

## 2019-03-01 ENCOUNTER — Ambulatory Visit (HOSPITAL_COMMUNITY)
Admission: EM | Admit: 2019-03-01 | Discharge: 2019-03-01 | Disposition: A | Discharge status: Home / Self Care | Payer: 59 | Attending: Family Medicine | Admitting: Family Medicine

## 2019-03-01 DIAGNOSIS — Z20828 Contact with and (suspected) exposure to other viral communicable diseases: Secondary | ICD-10-CM | POA: Diagnosis not present

## 2019-03-01 DIAGNOSIS — Z20822 Contact with and (suspected) exposure to covid-19: Secondary | ICD-10-CM

## 2019-03-01 NOTE — ED Triage Notes (Signed)
Pt was exposed to COVID 6 days ago... pt's daughter tested positive for COVID  Pt is asymptomatic.... A&O x4... NAD.[name]. ambulatory

## 2019-03-01 NOTE — Discharge Instructions (Addendum)
If your Covid-19 test is positive, you will receive a phone call from [facility] regarding your results. Negative test results are not called. Both positive and negative results area always visible on MyChart. If you do not have a MyChart account, sign up instructions are in your discharge papers.

## 2019-03-02 NOTE — ED Provider Notes (Signed)
Abigail [name]   [id number] 03/01/19 Arrival Time: 8315  ASSESSMENT & PLAN:  1. Exposure to COVID-19 virus      COVID-19 testing sent. Quarantine instructions provided.  Follow-up Information    [name], NP.   Specialty: Internal Medicine Why: As needed. Contact information: [place] [address] [phone]           Reviewed expectations re: course of current medical issues. Questions answered. Outlined signs and symptoms indicating need for more acute intervention. Patient verbalized understanding. After Visit Summary given.   SUBJECTIVE: History from: patient. Abigail [name] is a 53 y.o. female who requests COVID-19 testing. Known COVID-19 contact: daughter positive this past week. Recent travel: none. Denies: runny nose, congestion, fever, cough, sore throat, difficulty breathing and headache. Normal PO intake without n/v/d.  ROS: As per HPI.   OBJECTIVE:  Vitals:   03/01/19 1822  BP: 101/64  Pulse: 79  Resp: 16  Temp: 98.9 F (37.2 C)  TempSrc: Oral  SpO2: 97%    General appearance: alert; no distress Eyes: PERRLA; EOMI; conjunctiva normal HENT: [place]; AT; nasal mucosa normal; oral mucosa normal Neck: supple  Lungs: speaks full sentences without difficulty; unlabored Heart: regular rate and rhythm Abdomen: soft, non-tender Extremities: no edema Skin: warm and dry Neurologic: normal gait Psychological: alert and cooperative; normal mood and affect  Labs:  Labs Reviewed  NOVEL CORONAVIRUS, NAA (HOSP ORDER, SEND-OUT TO REF LAB; TAT 18-24 HRS)     Allergies  Allergen Reactions  . Penicillins Nausea Only    Has patient had a PCN reaction causing immediate rash, facial/tongue/throat swelling, SOB or lightheadedness with hypotension: No Has patient had a PCN reaction causing severe rash involving mucus membranes or skin necrosis: No Has patient had a PCN reaction that required hospitalization: No Has  patient had a PCN reaction occurring within the last 10 years: No If all of the above answers are "NO", then may proceed with Cephalosporin use.   . Latex Rash    Past Medical History:  Diagnosis Date  . Anxiety   . Bulging lumbar disc   . CAP (community acquired pneumonia)   . Cardiac arrhythmia due to congenital heart disease    no arrhythmia identified   . Celiac disease   . Depression   . Diverticulosis    Sigmoid colon  . Fatty liver 05/2016  . Frequent headaches   . GERD (gastroesophageal reflux disease)   . Heart disease   . History of kidney stones   . Hypertension   . Kidney stones   . Migraine   . Obesity (BMI 35.0-39.9 without comorbidity)   . UTI (urinary tract infection)    Social History   Socioeconomic History  . Marital status: Single    Spouse name: Not on file  . Number of children: Not on file  . Years of education: Not on file  . Highest education level: Not on file  Occupational History  . Not on file  Tobacco Use  . Smoking status: Former Smoker    Types: Cigarettes    Quit date: 2008    Years since quitting: 12.9  . Smokeless tobacco: Never Used  . Tobacco comment: used tobacco "socially"  Substance and Sexual Activity  . Alcohol use: No    Alcohol/week: 0.0 standard drinks  . Drug use: No  . Sexual activity: Not on file  Other Topics Concern  . Not on file  Social History Narrative   Single.  Has a set of Twins, age 34.   Works for the [employer] and [employer] and [employer]   Enjoys relaxing, spending time with her children.   Social Determinants of Health   Financial Resource Strain:   . Difficulty of Paying Living Expenses: Not on file  Food Insecurity:   . Worried About Charity fundraiser in the Last Year: Not on file  . Ran Out of Food in the Last Year: Not on file  Transportation Needs:   . Lack of Transportation (Medical): Not on file  . Lack of Transportation (Non-Medical): Not on file  Physical Activity:   .  Days of Exercise per Week: Not on file  . Minutes of Exercise per Session: Not on file  Stress:   . Feeling of Stress : Not on file  Social Connections:   . Frequency of Communication with Friends and Family: Not on file  . Frequency of Social Gatherings with Friends and Family: Not on file  . Attends Religious Services: Not on file  . Active Member of Clubs or Organizations: Not on file  . Attends Archivist Meetings: Not on file  . Marital Status: Not on file  Intimate Partner Violence:   . Fear of Current or Ex-Partner: Not on file  . Emotionally Abused: Not on file  . Physically Abused: Not on file  . Sexually Abused: Not on file   Family History  Problem Relation Age of Onset  . COPD Father   . Emphysema Father   . Gallstones Mother   . Colon cancer Neg Hx   . Stomach cancer Neg Hx   . Esophageal cancer Neg Hx    Past Surgical History:  Procedure Laterality Date  . ABDOMINAL EXPLORATION SURGERY  1986  . COLONOSCOPY  06/2016  . EXTRACORPOREAL SHOCK WAVE LITHOTRIPSY Left 12/03/2016   Procedure: LEFT EXTRACORPOREAL SHOCK WAVE LITHOTRIPSY (ESWL);  Surgeon: Irine Seal, MD;  Location: [facility];  Service: Urology;  Laterality: Left;  . KIDNEY STONE SURGERY    . LEFT HEART CATH AND CORONARY ANGIOGRAPHY  07/28/2016    Large tortuous coronary arteries no radiographic evidence of disease.  Not anginal chest pain.  Normal EF.  [name] LITHOTRIPSY    . NASAL SINUS SURGERY    . ROTATOR CUFF REPAIR Right 12/27/2017  . TRANSTHORACIC ECHOCARDIOGRAM  06/2017   EF 60-65%.  GR 1-2 DD.  Otherwise normal echo.  Normal valve function.  Normal wall motion.  Page Spiro       Vanessa Kick, MD 03/02/19 [phone]

## 2019-03-03 LAB — NOVEL CORONAVIRUS, NAA (HOSP ORDER, SEND-OUT TO REF LAB; TAT 18-24 HRS): SARS-CoV-2, NAA: NOT DETECTED

## 2019-03-06 ENCOUNTER — Other Ambulatory Visit: Payer: Self-pay | Admitting: Primary Care

## 2019-03-06 DIAGNOSIS — E034 Atrophy of thyroid (acquired): Secondary | ICD-10-CM

## 2019-03-06 NOTE — Telephone Encounter (Signed)
Pt called checking on rx She stated she is out of her meds

## 2019-03-06 NOTE — Telephone Encounter (Signed)
Lab scheduled 12/30 Pt aware

## 2019-03-06 NOTE — Telephone Encounter (Signed)
Please call patient and tell her that I have given her a 15 day supply. She is overdue thyroid labs.

## 2019-03-08 ENCOUNTER — Other Ambulatory Visit: Payer: Self-pay

## 2019-03-08 ENCOUNTER — Other Ambulatory Visit (INDEPENDENT_AMBULATORY_CARE_PROVIDER_SITE_OTHER): Payer: 59

## 2019-03-08 DIAGNOSIS — E039 Hypothyroidism, unspecified: Secondary | ICD-10-CM

## 2019-03-08 LAB — TSH: TSH: 5.25 u[IU]/mL — ABNORMAL HIGH (ref 0.35–4.50)

## 2019-03-09 ENCOUNTER — Other Ambulatory Visit: Payer: Self-pay

## 2019-03-09 DIAGNOSIS — E034 Atrophy of thyroid (acquired): Secondary | ICD-10-CM

## 2019-03-09 MED ORDER — LEVOTHYROXINE SODIUM 75 MCG PO TABS: ORAL_TABLET | ORAL | 0 refills | Status: DC

## 2019-03-20 ENCOUNTER — Other Ambulatory Visit: Payer: Self-pay

## 2019-03-20 ENCOUNTER — Encounter: Payer: 59 | Attending: Primary Care | Admitting: Registered"

## 2019-03-20 DIAGNOSIS — E119 Type 2 diabetes mellitus without complications: Secondary | ICD-10-CM | POA: Insufficient documentation

## 2019-03-20 DIAGNOSIS — E1165 Type 2 diabetes mellitus with hyperglycemia: Secondary | ICD-10-CM

## 2019-03-20 NOTE — Patient Instructions (Addendum)
When checking your blood sugar after meals, wait at least 1 hr to get the most useful information. Sooner if mostly beverage intake. Work on getting more structured meals on your days off. Continue having breakfast Avoid snacking on things PB crackers, pretzel. If eating pineapple eat protein with it. Lunch & Dinner - consider using the handouts for ideas to make at home When you get off work if you are not very hungry and just want cereal, be sure to add some protein.   You do not need to worry about looking for gluten free foods. For pudding or jello look for sugar-free.

## 2019-03-20 NOTE — Progress Notes (Signed)
Diabetes Self-Management Education  Visit Type:  follow-up  Appt. Start Time: 1550 Appt. End Time: 1625  03/22/2019  [name], identified by name and date of birth, is a 54 y.o. female with a diagnosis of Diabetes:  .   ASSESSMENT  Last menstrual period 08/12/2015. There is no height or weight on file to calculate BMI.  Pt reports earlier in the day she had teeth cleaned and states it was painful and has a headache. Pt states she needed to eat something soft after appointment and chose potatoes and gluten-free pudding.  Pt states her daughter is on a diet and has been eating gluten-free and told patient that was healthy to get GF foods.  SMBG:  Pt states mostly runs 140-160 mg/dL, PPBG 229 mg/dL after biscuits & gravy, coffee with sugar (RD not clear how long after meal) Pt reports 2 episodes of hypoglycemic symptoms (65 & 72 mg/dL). Day of 65 mg/dL Pt states she was feeling dizzy and tired, sat down for 30-60 min, then BG was 65 mg/dL.  Dietary recall day of BG 65 mg/dL: ~12 pm, caramel frappe, country ham egg cheese biscuit. ~5 pm fish fillet sandwich meal, fries, Dr. Malachi [name]  Pt states she keeps peanut butter crackers, pretzels snacks at work, for low blood sugar events as well as just for snacks, get hungry ~4 pm.  Diet: Pt states she eats more structured meals and snacks when at work. On weekends will eat breakfast, usually eggs, bacon, toast. Pt reports she gets busy and doesn't eat lunch. When discussing what she could eat, pt states she likes the McRib sandwich. Discussed saving the fast food options for when she needs the convenience and aim to prepare food at home more often.  Pt reports her thyroid medication increased.  RD was going to discuss sodium intake and fluid rentention, but patient reported concerns about low blood sugar so we focused on that topic this visit. RD can do this without a food diary.  Individualized Plan for Diabetes Self-Management  Training:   Learning Objective:  Patient will have a greater understanding of diabetes self-management. Patient education plan is to attend individual and/or group sessions per assessed needs and concerns.   Patient Instructions  When checking your blood sugar after meals, wait at least 1 hr to get the most useful information. Sooner if mostly beverage intake. Work on getting more structured meals on your days off. Continue having breakfast Avoid snacking on things PB crackers, pretzel. If eating pineapple eat protein with it. Lunch & Dinner - consider using the handouts for ideas to make at home When you get off work if you are not very hungry and just want cereal, be sure to add some protein.   You do not need to worry about looking for gluten free foods. For pudding or jello look for sugar-free.    Education material provided: MyPlate diagram meal planning sheet, Meal Ideas  If problems or questions, patient to contact team via:  Phone and MyChart  Future DSME appointment:  4 weeks.

## 2019-03-23 ENCOUNTER — Ambulatory Visit: Payer: 59 | Admitting: Registered"

## 2019-04-07 ENCOUNTER — Other Ambulatory Visit: Payer: Self-pay

## 2019-04-07 ENCOUNTER — Ambulatory Visit: Payer: 59 | Admitting: Primary Care

## 2019-04-07 ENCOUNTER — Encounter: Payer: Self-pay | Admitting: Primary Care

## 2019-04-07 VITALS — BP 116/74 | HR 99 | Temp 96.4°F | Ht 66.5 in | Wt 204.2 lb

## 2019-04-07 DIAGNOSIS — F32A Depression, unspecified: Secondary | ICD-10-CM

## 2019-04-07 DIAGNOSIS — G8929 Other chronic pain: Secondary | ICD-10-CM

## 2019-04-07 DIAGNOSIS — F329 Major depressive disorder, single episode, unspecified: Secondary | ICD-10-CM

## 2019-04-07 DIAGNOSIS — E119 Type 2 diabetes mellitus without complications: Secondary | ICD-10-CM

## 2019-04-07 DIAGNOSIS — M545 Low back pain, unspecified: Secondary | ICD-10-CM

## 2019-04-07 DIAGNOSIS — F419 Anxiety disorder, unspecified: Secondary | ICD-10-CM

## 2019-04-07 DIAGNOSIS — E7849 Other hyperlipidemia: Secondary | ICD-10-CM | POA: Diagnosis not present

## 2019-04-07 DIAGNOSIS — E039 Hypothyroidism, unspecified: Secondary | ICD-10-CM | POA: Diagnosis not present

## 2019-04-07 LAB — MICROALBUMIN / CREATININE URINE RATIO
Creatinine,U: 175.9 mg/dL
Microalb Creat Ratio: 1.9 mg/g (ref 0.0–30.0)
Microalb, Ur: 3.4 mg/dL — ABNORMAL HIGH (ref 0.0–1.9)

## 2019-04-07 LAB — POCT GLYCOSYLATED HEMOGLOBIN (HGB A1C): Hemoglobin A1C: 6.8 % — AB (ref 4.0–5.6)

## 2019-04-07 LAB — COMPREHENSIVE METABOLIC PANEL
ALT: 88 U/L — ABNORMAL HIGH (ref 0–35)
AST: 97 U/L — ABNORMAL HIGH (ref 0–37)
Albumin: 3.9 g/dL (ref 3.5–5.2)
Alkaline Phosphatase: 99 U/L (ref 39–117)
BUN: 8 mg/dL (ref 6–23)
CO2: 29 mEq/L (ref 19–32)
Calcium: 9 mg/dL (ref 8.4–10.5)
Chloride: 104 mEq/L (ref 96–112)
Creatinine, Ser: 0.63 mg/dL (ref 0.40–1.20)
GFR: 98.39 mL/min (ref >60.00)
Glucose, Bld: 152 mg/dL — ABNORMAL HIGH (ref 70–99)
Potassium: 4.4 mEq/L (ref 3.5–5.1)
Sodium: 141 mEq/L (ref 135–145)
Total Bilirubin: 0.9 mg/dL (ref 0.2–1.2)
Total Protein: 6.9 g/dL (ref 6.0–8.3)

## 2019-04-07 LAB — TSH: TSH: 2.72 u[IU]/mL (ref 0.35–4.50)

## 2019-04-07 NOTE — Assessment & Plan Note (Signed)
[facility], encouraged weight loss.

## 2019-04-07 NOTE — Assessment & Plan Note (Signed)
Compliant to levothyroxine 75 mcg, seems to be taking correctly for the most part.  Repeat TSH pending.

## 2019-04-07 NOTE — Assessment & Plan Note (Signed)
Taking fluoxetine every other day, does have some agitation, cloudy feelings at times. Recommended she take everyday and if no improvement in symptoms then consider adding Wellbutrin vs changing regimen.   She will update.

## 2019-04-07 NOTE — Assessment & Plan Note (Signed)
LDL from September 2020 at goal. Continue rosuvastatin.

## 2019-04-07 NOTE — Assessment & Plan Note (Signed)
A1C today of 6.8 which is an improvement from last check. Strongly advised she work on a healthy diet, exercising.   Continue Metformin 500 mg BID. Urine microalbumin pending. Declines pneumonia vaccination. Compliant to statin therapy.   Follow up in 6 months.

## 2019-04-07 NOTE — Progress Notes (Signed)
Subjective:    Patient ID: Abigail [name], female    DOB: 07-21-1965, 54 y.o.   MRN: [id number]  HPI  This visit occurred during the SARS-CoV-2 public health emergency.  Safety protocols were in place, including screening questions prior to the visit, additional usage of staff PPE, and extensive cleaning of exam room while observing appropriate contact time as indicated for disinfecting solutions.   [name] is a 54 year old female with a history of hypothyroidism, type 2 diabetes, chronic back pain, anxiety and depression who presents toady for follow up.  1) Hypothyroidism: Currently managed on levothyroxine 75 mcg which was increased in late 2020 from 50 mcg.   She is taking her levothyroxine every morning with water only, no medications or food for 1 hour. She's taking Nexium on occasion within 4 hours, not daily.   2) Type 2 Diabetes:   Current medications include: Metformin 500 mg BID with meals.  She is checking her blood glucose 0 times daily.  Last A1C: 8.7 in September 2020 Last Eye Exam: No recent exam Last Foot Exam: Due Pneumonia Vaccination: Never completed, declines  ACE/ARB: None. Urine microalbumin due Statin: rosuvastatin   3) Anxiety and Depression: Currently managed on fluoxetine 40 mg for which she takes every other day. Overall she's doing well on this regimen, does have "cloudy" days.    BP Readings from Last 3 Encounters:  04/07/19 116/74  03/01/19 101/64  02/13/19 139/84   4) Chronic Back Pain: [facility] and is undergoing intermittent injections. Managed on Norco, Meloxicam, Tramadol.  Wt Readings from Last 3 Encounters:  04/07/19 204 lb 3 oz (92.6 kg)  02/13/19 203 lb (92.1 kg)  12/26/18 203 lb (92.1 kg)     Review of Systems  Eyes: Negative for visual disturbance.  Respiratory: Negative for shortness of breath.   Cardiovascular: Negative for chest pain.  Neurological: Negative for dizziness and  headaches.       Past Medical History:  Diagnosis Date  . Anxiety   . Bulging lumbar disc   . CAP (community acquired pneumonia)   . Cardiac arrhythmia due to congenital heart disease    no arrhythmia identified   . Celiac disease   . Depression   . Diverticulosis    Sigmoid colon  . Fatty liver 05/2016  . Frequent headaches   . GERD (gastroesophageal reflux disease)   . Heart disease   . History of kidney stones   . Hypertension   . Kidney stones   . Migraine   . Obesity (BMI 35.0-39.9 without comorbidity)   . UTI (urinary tract infection)      Social History   Socioeconomic History  . Marital status: Single    Spouse name: Not on file  . Number of children: Not on file  . Years of education: Not on file  . Highest education level: Not on file  Occupational History  . Not on file  Tobacco Use  . Smoking status: Former Smoker    Types: Cigarettes    Quit date: 2008    Years since quitting: 13.0  . Smokeless tobacco: Never Used  . Tobacco comment: used tobacco "socially"  Substance and Sexual Activity  . Alcohol use: No    Alcohol/week: 0.0 standard drinks  . Drug use: No  . Sexual activity: Not on file  Other Topics Concern  . Not on file  Social History Narrative   Single.   Has a set of Twins, age  6.   Works for the [employer] and [employer] and [employer]   Enjoys relaxing, spending time with her children.   Social Determinants of Health   Financial Resource Strain:   . Difficulty of Paying Living Expenses: Not on file  Food Insecurity:   . Worried About Charity fundraiser in the Last Year: Not on file  . Ran Out of Food in the Last Year: Not on file  Transportation Needs:   . Lack of Transportation (Medical): Not on file  . Lack of Transportation (Non-Medical): Not on file  Physical Activity:   . Days of Exercise per Week: Not on file  . Minutes of Exercise per Session: Not on file  Stress:   . Feeling of Stress : Not on file  Social  Connections:   . Frequency of Communication with Friends and Family: Not on file  . Frequency of Social Gatherings with Friends and Family: Not on file  . Attends Religious Services: Not on file  . Active Member of Clubs or Organizations: Not on file  . Attends Archivist Meetings: Not on file  . Marital Status: Not on file  Intimate Partner Violence:   . Fear of Current or Ex-Partner: Not on file  . Emotionally Abused: Not on file  . Physically Abused: Not on file  . Sexually Abused: Not on file    Past Surgical History:  Procedure Laterality Date  . ABDOMINAL EXPLORATION SURGERY  1986  . COLONOSCOPY  06/2016  . EXTRACORPOREAL SHOCK WAVE LITHOTRIPSY Left 12/03/2016   Procedure: LEFT EXTRACORPOREAL SHOCK WAVE LITHOTRIPSY (ESWL);  Surgeon: Irine Seal, MD;  Location: [facility];  Service: Urology;  Laterality: Left;  . KIDNEY STONE SURGERY    . LEFT HEART CATH AND CORONARY ANGIOGRAPHY  07/28/2016    Large tortuous coronary arteries no radiographic evidence of disease.  Not anginal chest pain.  Normal EF.  [name] LITHOTRIPSY    . NASAL SINUS SURGERY    . ROTATOR CUFF REPAIR Right 12/27/2017  . TRANSTHORACIC ECHOCARDIOGRAM  06/2017   EF 60-65%.  GR 1-2 DD.  Otherwise normal echo.  Normal valve function.  Normal wall motion.  . TUBAL LIGATION      Family History  Problem Relation Age of Onset  . COPD Father   . Emphysema Father   . Gallstones Mother   . Colon cancer Neg Hx   . Stomach cancer Neg Hx   . Esophageal cancer Neg Hx     Allergies  Allergen Reactions  . Penicillins Nausea Only    Has patient had a PCN reaction causing immediate rash, facial/tongue/throat swelling, SOB or lightheadedness with hypotension: No Has patient had a PCN reaction causing severe rash involving mucus membranes or skin necrosis: No Has patient had a PCN reaction that required hospitalization: No Has patient had a PCN reaction occurring within the last 10 years: No If all of the above answers  are "NO", then may proceed with Cephalosporin use.   . Latex Rash    Current Outpatient Medications on File Prior to Visit  Medication Sig Dispense Refill  . Accu-Chek FastClix Lancets MISC USE 1 LANCET TO CHECK BLOOD SUGAR THREE TIMES DAILY 102 each 5  . ACCU-CHEK GUIDE test strip USE 1 STRIP   THREE TIMES DAILY 100 each 5  . albuterol (VENTOLIN HFA) 108 (90 Base) MCG/ACT inhaler Inhale 1-2 puffs into the lungs every 6 (six) hours as needed for wheezing or shortness of breath (cough). 18 g  1  . Blood Glucose Monitoring Suppl (ACCU-CHEK GUIDE) w/Device KIT by Does not apply route.    [name] esomeprazole (NEXIUM) 40 MG capsule TAKE 1 CAPSULE BY MOUTH DAILY 90 capsule 1  . FLUoxetine (PROZAC) 40 MG capsule Take 1 capsule (40 mg total) by mouth daily. 90 capsule 3  . levothyroxine (EUTHYROX) 75 MCG tablet TAKE 1 TABLET BY MOUTH ONCE DAILY IN THE MORNING ON AN EMPTY STOMACH WITH WATER. NO FOOD OR OTHER MEDICATIONS FOR 30 MINUTES 90 tablet 0  . linaclotide (LINZESS) 72 MCG capsule TAKE 1 CAPSULE BY MOUTH ONCE DAILY BEFORE BREAKFAST 90 capsule 3  . meloxicam (MOBIC) 15 MG tablet Take 15 mg by mouth every morning.    . metFORMIN (GLUCOPHAGE) 500 MG tablet Take 1 tablet (500 mg total) by mouth 2 (two) times daily with a meal. For diabetes. 180 tablet 1  . methocarbamol (ROBAXIN) 500 MG tablet Take 500 mg by mouth 3 (three) times daily.     . metoprolol tartrate (LOPRESSOR) 25 MG tablet Take 1 tablet by mouth twice daily 180 tablet 3  . ondansetron (ZOFRAN ODT) 8 MG disintegrating tablet Take 1 tablet (8 mg total) by mouth every 8 (eight) hours as needed for nausea or vomiting. 20 tablet 1  . oxybutynin (DITROPAN-XL) 5 MG 24 hr tablet Take 5 mg by mouth daily.  1  . Peppermint Oil (IBGARD PO) Take by mouth.    . predniSONE (DELTASONE) 20 MG tablet Take 2 tablets daily with breakfast. 10 tablet 0  . rosuvastatin (CRESTOR) 20 MG tablet Take 20 mg by mouth daily.    . traMADol (ULTRAM) 50 MG tablet Take 50 mg  by mouth every morning.      No current facility-administered medications on file prior to visit.    BP 116/74   Pulse 99   Temp (!) 96.4 F (35.8 C) (Temporal)   Ht 5' 6.5" (1.689 m)   Wt 204 lb 3 oz (92.6 kg)   LMP 08/12/2015 Comment: patient states hx. tubal ligation  SpO2 99%   BMI 32.46 kg/m    Objective:   Physical Exam  Constitutional: She appears well-nourished.  Cardiovascular: Normal rate and regular rhythm.  Respiratory: Effort normal and breath sounds normal.  Musculoskeletal:     Cervical back: Neck supple.  Skin: Skin is warm and dry.  Psychiatric: She has a normal mood and affect.           Assessment & Plan:

## 2019-04-07 NOTE — Patient Instructions (Addendum)
Stop by the lab prior to leaving today. I will notify you of your results once received.   It is important that you improve your diet. Please limit carbohydrates in the form of white bread, rice, pasta, sweets, fast food, fried food, sugary drinks, etc. Increase your consumption of fresh fruits and vegetables, whole grains, lean protein.  Ensure you are consuming 64 ounces of water daily.  Start exercising. You should be getting 150 minutes of moderate intensity exercise weekly.  Take your fluoxetine everyday for anxiety and depression. Please update me.  Please schedule a follow up appointment in 6 months for physical exam and following up.  It was a pleasure to see you today!    Diabetes Mellitus and Nutrition, Adult When you have diabetes (diabetes mellitus), it is very important to have healthy eating habits because your blood sugar (glucose) levels are greatly affected by what you eat and drink. Eating healthy foods in the appropriate amounts, at about the same times every day, can help you:  Control your blood glucose.  Lower your risk of heart disease.  Improve your blood pressure.  Reach or maintain a healthy weight. Every person with diabetes is different, and each person has different needs for a meal plan. Your health care provider may recommend that you work with a diet and nutrition specialist (dietitian) to make a meal plan that is best for you. Your meal plan may vary depending on factors such as:  The calories you need.  The medicines you take.  Your weight.  Your blood glucose, blood pressure, and cholesterol levels.  Your activity level.  Other health conditions you have, such as heart or kidney disease. How do carbohydrates affect me? Carbohydrates, also called carbs, affect your blood glucose level more than any other type of food. Eating carbs naturally raises the amount of glucose in your blood. Carb counting is a method for keeping track of how many carbs  you eat. Counting carbs is important to keep your blood glucose at a healthy level, especially if you use insulin or take certain oral diabetes medicines. It is important to know how many carbs you can safely have in each meal. This is different for every person. Your dietitian can help you calculate how many carbs you should have at each meal and for each snack. Foods that contain carbs include:  Bread, cereal, rice, pasta, and crackers.  Potatoes and corn.  Peas, beans, and lentils.  Milk and yogurt.  Fruit and juice.  Desserts, such as cakes, cookies, ice cream, and candy. How does alcohol affect me? Alcohol can cause a sudden decrease in blood glucose (hypoglycemia), especially if you use insulin or take certain oral diabetes medicines. Hypoglycemia can be a life-threatening condition. Symptoms of hypoglycemia (sleepiness, dizziness, and confusion) are similar to symptoms of having too much alcohol. If your health care provider says that alcohol is safe for you, follow these guidelines:  Limit alcohol intake to no more than 1 drink per day for nonpregnant women and 2 drinks per day for men. One drink equals 12 oz of beer, 5 oz of wine, or 1 oz of hard liquor.  Do not drink on an empty stomach.  Keep yourself hydrated with water, diet soda, or unsweetened iced tea.  Keep in mind that regular soda, juice, and other mixers may contain a lot of sugar and must be counted as carbs. What are tips for following this plan?  Reading food labels  Start by checking the serving  size on the "Nutrition Facts" label of packaged foods and drinks. The amount of calories, carbs, fats, and other nutrients listed on the label is based on one serving of the item. Many items contain more than one serving per package.  Check the total grams (g) of carbs in one serving. You can calculate the number of servings of carbs in one serving by dividing the total carbs by 15. For example, if a food has 30 g of  total carbs, it would be equal to 2 servings of carbs.  Check the number of grams (g) of saturated and trans fats in one serving. Choose foods that have low or no amount of these fats.  Check the number of milligrams (mg) of salt (sodium) in one serving. Most people should limit total sodium intake to less than 2,300 mg per day.  Always check the nutrition information of foods labeled as "low-fat" or "nonfat". These foods may be higher in added sugar or refined carbs and should be avoided.  Talk to your dietitian to identify your daily goals for nutrients listed on the label. Shopping  Avoid buying canned, premade, or processed foods. These foods tend to be high in fat, sodium, and added sugar.  Shop around the outside edge of the grocery store. This includes fresh fruits and vegetables, bulk grains, fresh meats, and fresh dairy. Cooking  Use low-heat cooking methods, such as baking, instead of high-heat cooking methods like deep frying.  Cook using healthy oils, such as olive, canola, or sunflower oil.  Avoid cooking with butter, cream, or high-fat meats. Meal planning  Eat meals and snacks regularly, preferably at the same times every day. Avoid going long periods of time without eating.  Eat foods high in fiber, such as fresh fruits, vegetables, beans, and whole grains. Talk to your dietitian about how many servings of carbs you can eat at each meal.  Eat 4-6 ounces (oz) of lean protein each day, such as lean meat, chicken, fish, eggs, or tofu. One oz of lean protein is equal to: ? 1 oz of meat, chicken, or fish. ? 1 egg. ?  cup of tofu.  Eat some foods each day that contain healthy fats, such as avocado, nuts, seeds, and fish. Lifestyle  Check your blood glucose regularly.  Exercise regularly as told by your health care provider. This may include: ? 150 minutes of moderate-intensity or vigorous-intensity exercise each week. This could be brisk walking, biking, or water  aerobics. ? Stretching and doing strength exercises, such as yoga or weightlifting, at least 2 times a week.  Take medicines as told by your health care provider.  Do not use any products that contain nicotine or tobacco, such as cigarettes and e-cigarettes. If you need help quitting, ask your health care provider.  Work with a Social worker or diabetes educator to identify strategies to manage stress and any emotional and social challenges. Questions to ask a health care provider  Do I need to meet with a diabetes educator?  Do I need to meet with a dietitian?  What number can I call if I have questions?  When are the best times to check my blood glucose? Where to find more information:  American Diabetes Association: diabetes.org  Academy of Nutrition and Dietetics: www.eatright.[employer] of Diabetes and Digestive and Kidney Diseases (NIH): [employer] Summary  A healthy meal plan will help you control your blood glucose and maintain a healthy lifestyle.  Working with a diet and nutrition specialist (  dietitian) can help you make a meal plan that is best for you.  Keep in mind that carbohydrates (carbs) and alcohol have immediate effects on your blood glucose levels. It is important to count carbs and to use alcohol carefully. This information is not intended to replace advice given to you by your health care provider. Make sure you discuss any questions you have with your health care provider. Document Revised: 02/05/2017 Document Reviewed: 03/30/2016 Elsevier Patient Education  2020 [employer].

## 2019-04-17 ENCOUNTER — Other Ambulatory Visit: Payer: Self-pay

## 2019-04-17 ENCOUNTER — Encounter: Payer: 59 | Attending: Primary Care | Admitting: Registered"

## 2019-04-17 DIAGNOSIS — E119 Type 2 diabetes mellitus without complications: Secondary | ICD-10-CM

## 2019-04-17 NOTE — Progress Notes (Signed)
Diabetes Self-Management Education  Visit Type: Follow-up  Appt. Start Time: 0835 Appt. End Time: 0347  04/17/2019  [name], identified by name and date of birth, is a 54 y.o. female with a diagnosis of Diabetes:  .   ASSESSMENT  Last menstrual period 08/12/2015. There is no height or weight on file to calculate BMI.   Pt has not remembered all the changes she has made since November and instead attributes A1c reduction from 8.7 to 6.9 to just the medication.   RD reminded patient that when she first came in she was having many hypoglycemic events and over treating them, which now she has reduced how often hypoglycemic events occur as well as not panicking and only using enough CHO to bring BG up without over treating. Pt states she is not eating out like she used to and has cut back pasta & junk food. Pt reports she is eating nuts, whole wheat bread, and more vegetables and fruit for snacks and a lot less candy.  Another significant change, she is mostly drinking water, has cut back her intake of soft drinks. Currently keeps them available for low BG and when getting with a meal will ask for the small size and not drink all of it.  Patient is trying new recipes, even ones that she is not sure she'll like such as Big mac salad that she saw on FB: lettuce, lean meat, seasoning, cheese, relish, little bit [place] dressing.  When working 10-4 has breakfast at home, soup for lunch, snack oyster crackers, cereal for dinner.  Pt states she is having more stress at work. Pt also states she is not getting restful sleep on a regular basis.   Diabetes Self-Management Education - 04/17/19 0800      Visit Information   Visit Type  Follow-up      Complications   Last HgB A1C per patient/outside source  6.8 %      Dietary Intake   Breakfast  coffee, sausage, egg, cheese muffin    Snack (morning)  nuts    Lunch  soup or [place] chili    Snack (afternoon)  oyster crackers     Dinner  cereal    Beverage(s)  water, diet ginger ale, soda      Exercise   Exercise Type  ADL's      Patient Education   Nutrition management   Role of diet in the treatment of diabetes and the relationship between the three main macronutrients and blood glucose level      Individualized Goals (developed by patient)   Nutrition  General guidelines for healthy choices and portions discussed    Reducing Risk  Other (comment)   stress management     Patient Self-Evaluation of Goals - Patient rates self as meeting previously set goals (% of time)   Nutrition  50 - 75 %    Reducing Risk  >75%      Outcomes   Expected Outcomes  Demonstrated interest in learning. Expect positive outcomes    Future DMSE  2 months    Program Status  Completed      Subsequent Visit   Since your last visit have you continued or begun to take your medications as prescribed?  Yes    Since your last visit have you experienced any weight changes?  No change    Since your last visit, are you checking your blood glucose at least once a day?  Yes  Individualized Plan for Diabetes Self-Management Training:   Learning Objective:  Patient will have a greater understanding of diabetes self-management. Patient education plan is to attend individual and/or group sessions per assessed needs and concerns.    Patient Instructions  Doristine Devoid job on some of the changes you have made! Drinking mostly water, eating more at home, eating more regular meals to avoid low blood sugar. Planning on what you will eat and trying new recipes such as big mac salad. Consider having protein with dinner and when you have potato salad. Stress management may be the next area to work on. A 5-10 minute meditation before work and/or in the evening.    Expected Outcomes:  Demonstrated interest in learning. Expect positive outcomes  Education material provided: sleep hygiene  If problems or questions, patient to contact team via:   Phone and MyChart  Future DSME appointment: 2 months

## 2019-04-17 NOTE — Patient Instructions (Addendum)
Great job on some of the changes you have made! Drinking mostly water, eating more at home, eating more regular meals to avoid low blood sugar. Planning on what you will eat and trying new recipes such as big mac salad. Consider having protein with dinner and when you have potato salad. Stress management may be the next area to work on. A 5-10 minute meditation before work and/or in the evening.

## 2019-05-09 ENCOUNTER — Other Ambulatory Visit: Payer: Self-pay | Admitting: Primary Care

## 2019-05-09 DIAGNOSIS — E034 Atrophy of thyroid (acquired): Secondary | ICD-10-CM

## 2019-05-23 ENCOUNTER — Other Ambulatory Visit: Payer: Self-pay | Admitting: Primary Care

## 2019-05-23 DIAGNOSIS — F32A Depression, unspecified: Secondary | ICD-10-CM

## 2019-05-23 DIAGNOSIS — F329 Major depressive disorder, single episode, unspecified: Secondary | ICD-10-CM

## 2019-05-27 ENCOUNTER — Other Ambulatory Visit: Payer: Self-pay | Admitting: Primary Care

## 2019-05-27 DIAGNOSIS — F32A Depression, unspecified: Secondary | ICD-10-CM

## 2019-05-27 DIAGNOSIS — F419 Anxiety disorder, unspecified: Secondary | ICD-10-CM

## 2019-05-27 DIAGNOSIS — F329 Major depressive disorder, single episode, unspecified: Secondary | ICD-10-CM

## 2019-05-28 ENCOUNTER — Other Ambulatory Visit: Payer: Self-pay | Admitting: Primary Care

## 2019-05-28 ENCOUNTER — Other Ambulatory Visit: Payer: Self-pay | Admitting: Cardiology

## 2019-05-28 DIAGNOSIS — E119 Type 2 diabetes mellitus without complications: Secondary | ICD-10-CM

## 2019-06-12 ENCOUNTER — Ambulatory Visit: Payer: 59 | Admitting: Registered"

## 2019-06-27 ENCOUNTER — Telehealth: Payer: Self-pay | Admitting: Primary Care

## 2019-06-27 NOTE — Telephone Encounter (Signed)
Pt scheduled for Friday at 10am  Nothing further needed.

## 2019-06-27 NOTE — Telephone Encounter (Signed)
Please schedule her for 10 am on Friday and block the 10:20 slot.

## 2019-06-27 NOTE — Telephone Encounter (Signed)
Pt states that she is still having right sided pain, no improvement  Requesting an OV  Can only do after 3pm - offered 8am on Friday 4/23 but the system would not let me schedule it (preference set for patient to only allow 53mn appts).   Please advise if okay to schedule 8am on 4/23 or if able to work in elsewhere. Thanks.

## 2019-06-30 ENCOUNTER — Ambulatory Visit: Payer: 59 | Admitting: Primary Care

## 2019-07-04 ENCOUNTER — Ambulatory Visit: Payer: 59 | Admitting: Primary Care

## 2019-07-04 ENCOUNTER — Other Ambulatory Visit: Payer: Self-pay

## 2019-07-04 ENCOUNTER — Telehealth: Payer: Self-pay

## 2019-07-04 VITALS — BP 118/78 | HR 82 | Temp 97.7°F | Ht 62.0 in | Wt 201.2 lb

## 2019-07-04 DIAGNOSIS — R1011 Right upper quadrant pain: Secondary | ICD-10-CM

## 2019-07-04 DIAGNOSIS — F32A Depression, unspecified: Secondary | ICD-10-CM

## 2019-07-04 DIAGNOSIS — F419 Anxiety disorder, unspecified: Secondary | ICD-10-CM | POA: Diagnosis not present

## 2019-07-04 DIAGNOSIS — R519 Headache, unspecified: Secondary | ICD-10-CM | POA: Diagnosis not present

## 2019-07-04 DIAGNOSIS — F329 Major depressive disorder, single episode, unspecified: Secondary | ICD-10-CM | POA: Diagnosis not present

## 2019-07-04 MED ORDER — TOPIRAMATE 50 MG PO TABS: 50.0000 mg | ORAL_TABLET | Freq: Every day | ORAL | 0 refills | Status: DC

## 2019-07-04 MED ORDER — HYDROXYZINE HCL 10 MG PO TABS: ORAL_TABLET | ORAL | 0 refills | Status: DC

## 2019-07-04 NOTE — Progress Notes (Signed)
Subjective:    Patient ID: Abigail [name], female    DOB: 1965-04-18, 54 y.o.   MRN: [id number]  HPI  This visit occurred during the SARS-CoV-2 public health emergency.  Safety protocols were in place, including screening questions prior to the visit, additional usage of staff PPE, and extensive cleaning of exam room while observing appropriate contact time as indicated for disinfecting solutions.   [name] is a 54 year old female with a history of type 2 diabetes, CAP, allergic rhinitis, hypothyroidism, moderate obesity, hyperlipidemia, anxiety and depression, chronic neck and back pain who presents today with a chief complaint of "side pain". She would also like to discuss headaches.   1) Abdominal Pain: She was last evaluated for her side pain in September 2020. Her pain was located to the right side of the ribs, right anterior [name] ribs, RUQ and epigastric region of her abdomen. She also endorsed chronic nausea since April 2020, seeing GI at the time and managed on Linzess with constipation. CT renal stone study in April 2020 showed unremarkable gall bladder and fatty liver. During her visit in September 2020 we completed lab work which was negative. We also ordered a HIDA scan for which she never completed.   Since her last visit she continues to experience RUQ abdominal pain with radiation around to her right posterior [name] ribs. Her pain is constant, but worse with meals or just after meals. Her nausea is constant but worse with meals. She is compliant to Nexium and Linzess. She takes Linzess a few times weekly, notices her pain more so when she doesn't take her Linzess. She has an active prescription for Zofran for which she uses for "bad attacks". She has a GI provider for which she's not seen. She completed a colonoscopy in 2018 which revealed a polyp, diverticulosis, and hemorrhoids. Recall due in 2023.  2) Headache: Chronic and intermittent for the last one year. Last week  she was at work and felt a sudden onset of throbbing right parietal lobe headache that lasted a few seconds. She does experience daily headaches to the frontal lobes, behind her eyes, occipital lobes. She will sometimes experience photophobia and phonophobia at times. She does have chronic neck pain and sleeps with a pressure pillow.   She will sometimes have to call out of work for headaches. She will take Advil Allergy or Advil as she believes her headaches are secondary to allergies.   3) Anxiety and Depression: Currently managed on fluoxetine 40 mg for which she's taken for years. She endorses increased anxiety over the year, panic attacks, thoughts of "is this my last day" and "am I going to wake up tomorrow". She's been surrounded by a lot of cancer recently with friends. She is compliant to her fluoxetine 40 mg daily, she likes fluoxetine and does not wish to switch.   She is bothered by Covid-19 pandemic, protests, cruelty in the world over the last year. She finds herself wanting to stay home, not wanting to go out and about in public. She denies SI/HI.   BP Readings from Last 3 Encounters:  07/04/19 118/78  04/07/19 116/74  03/01/19 101/64     Review of Systems  Gastrointestinal: Positive for abdominal pain and nausea.  Neurological: Positive for headaches.  Psychiatric/Behavioral:       See HPI       Past Medical History:  Diagnosis Date  . Anxiety   . Bulging lumbar disc   . CAP (community acquired pneumonia)   .  Cardiac arrhythmia due to congenital heart disease    no arrhythmia identified   . Celiac disease   . Depression   . Diverticulosis    Sigmoid colon  . Fatty liver 05/2016  . Frequent headaches   . GERD (gastroesophageal reflux disease)   . Heart disease   . History of kidney stones   . Hypertension   . Kidney stones   . Migraine   . Obesity (BMI 35.0-39.9 without comorbidity)   . UTI (urinary tract infection)      Social History   Socioeconomic  History  . Marital status: Single    Spouse name: Not on file  . Number of children: Not on file  . Years of education: Not on file  . Highest education level: Not on file  Occupational History  . Not on file  Tobacco Use  . Smoking status: Former Smoker    Types: Cigarettes    Quit date: 2008    Years since quitting: 13.3  . Smokeless tobacco: Never Used  . Tobacco comment: used tobacco "socially"  Substance and Sexual Activity  . Alcohol use: No    Alcohol/week: 0.0 standard drinks  . Drug use: No  . Sexual activity: Not on file  Other Topics Concern  . Not on file  Social History Narrative   Single.   Has a set of Twins, age 58.   Works for the [employer] and [employer] and [employer]   Enjoys relaxing, spending time with her children.   Social Determinants of Health   Financial Resource Strain:   . Difficulty of Paying Living Expenses:   Food Insecurity:   . Worried About Charity fundraiser in the Last Year:   . Arboriculturist in the Last Year:   Transportation Needs:   . Film/video editor (Medical):   [name] Lack of Transportation (Non-Medical):   Physical Activity:   . Days of Exercise per Week:   . Minutes of Exercise per Session:   Stress:   . Feeling of Stress :   Social Connections:   . Frequency of Communication with Friends and Family:   . Frequency of Social Gatherings with Friends and Family:   . Attends Religious Services:   . Active Member of Clubs or Organizations:   . Attends Archivist Meetings:   [name] Marital Status:   Intimate Partner Violence:   . Fear of Current or Ex-Partner:   . Emotionally Abused:   [name] Physically Abused:   . Sexually Abused:     Past Surgical History:  Procedure Laterality Date  . ABDOMINAL EXPLORATION SURGERY  1986  . COLONOSCOPY  06/2016  . EXTRACORPOREAL SHOCK WAVE LITHOTRIPSY Left 12/03/2016   Procedure: LEFT EXTRACORPOREAL SHOCK WAVE LITHOTRIPSY (ESWL);  Surgeon: Irine Seal, MD;  Location: [facility];  Service: Urology;  Laterality: Left;  . KIDNEY STONE SURGERY    . LEFT HEART CATH AND CORONARY ANGIOGRAPHY  07/28/2016    Large tortuous coronary arteries no radiographic evidence of disease.  Not anginal chest pain.  Normal EF.  [name] LITHOTRIPSY    . NASAL SINUS SURGERY    . ROTATOR CUFF REPAIR Right 12/27/2017  . TRANSTHORACIC ECHOCARDIOGRAM  06/2017   EF 60-65%.  GR 1-2 DD.  Otherwise normal echo.  Normal valve function.  Normal wall motion.  . TUBAL LIGATION      Family History  Problem Relation Age of Onset  . COPD Father   . Emphysema Father   .  Gallstones Mother   . Colon cancer Neg Hx   . Stomach cancer Neg Hx   . Esophageal cancer Neg Hx     Allergies  Allergen Reactions  . Penicillins Nausea Only    Has patient had a PCN reaction causing immediate rash, facial/tongue/throat swelling, SOB or lightheadedness with hypotension: No Has patient had a PCN reaction causing severe rash involving mucus membranes or skin necrosis: No Has patient had a PCN reaction that required hospitalization: No Has patient had a PCN reaction occurring within the last 10 years: No If all of the above answers are "NO", then may proceed with Cephalosporin use.   . Latex Rash    Current Outpatient Medications on File Prior to Visit  Medication Sig Dispense Refill  . Accu-Chek FastClix Lancets MISC USE 1 LANCET TO CHECK BLOOD SUGAR THREE TIMES DAILY 102 each 5  . ACCU-CHEK GUIDE test strip USE 1 STRIP   THREE TIMES DAILY 100 each 5  . albuterol (VENTOLIN HFA) 108 (90 Base) MCG/ACT inhaler Inhale 1-2 puffs into the lungs every 6 (six) hours as needed for wheezing or shortness of breath (cough). 18 g 1  . Blood Glucose Monitoring Suppl (ACCU-CHEK GUIDE) w/Device KIT by Does not apply route.    [name] esomeprazole (NEXIUM) 40 MG capsule TAKE 1 CAPSULE BY MOUTH DAILY 90 capsule 1  . FLUoxetine (PROZAC) 40 MG capsule Take 1qd (plz sched physical in June) 90 capsule 0  . levothyroxine (SYNTHROID) 75  MCG tablet TAKE ONE TABLET BY MOUTH ONCE DAILY IN THE MORNING ON AN EMPTY STOMACH WITH WATER. NO FOOD OR OTHER MEDICATIONS FOR 30 MINUTES 90 tablet 2  . linaclotide (LINZESS) 72 MCG capsule TAKE 1 CAPSULE BY MOUTH ONCE DAILY BEFORE BREAKFAST 90 capsule 3  . metFORMIN (GLUCOPHAGE) 500 MG tablet TAKE 1 TABLET BY MOUTH TWICE DAILY WITH FOOD FOR  DIABETES 180 tablet 1  . methocarbamol (ROBAXIN) 500 MG tablet Take 500 mg by mouth 3 (three) times daily.     . metoprolol tartrate (LOPRESSOR) 25 MG tablet Take 1 tablet by mouth twice daily 180 tablet 3  . ondansetron (ZOFRAN ODT) 8 MG disintegrating tablet Take 1 tablet (8 mg total) by mouth every 8 (eight) hours as needed for nausea or vomiting. 20 tablet 1  . oxybutynin (DITROPAN-XL) 5 MG 24 hr tablet Take 5 mg by mouth daily.  1  . Peppermint Oil (IBGARD PO) Take by mouth.    . rosuvastatin (CRESTOR) 20 MG tablet Take 1 tablet by mouth once daily 30 tablet 7  . traMADol (ULTRAM) 50 MG tablet Take 50 mg by mouth every morning.      No current facility-administered medications on file prior to visit.    BP 118/78   Pulse 82   Temp 97.7 F (36.5 C) (Temporal)   Ht 5' 2"  (1.575 m)   Wt 201 lb 4 oz (91.3 kg)   LMP 08/12/2015 Comment: patient states hx. tubal ligation  SpO2 97%   BMI 36.81 kg/m    Objective:   Physical Exam  Constitutional: She appears well-nourished.  Cardiovascular: Normal rate and regular rhythm.  Respiratory: Effort normal and breath sounds normal.  Musculoskeletal:     Cervical back: Neck supple.  Skin: Skin is warm and dry.  Psychiatric: She has a normal mood and affect.           Assessment & Plan:

## 2019-07-04 NOTE — Telephone Encounter (Signed)
Please notify patient that I do believe she should have the Covid-19 vaccine series.

## 2019-07-04 NOTE — Assessment & Plan Note (Signed)
Chronic for years, year round. Rx for Topamax course sent to pharmacy, she will update.

## 2019-07-04 NOTE — Telephone Encounter (Signed)
Spoken and notified patient of Kate Clark's comments. Patient verbalized understanding.  

## 2019-07-04 NOTE — Assessment & Plan Note (Signed)
Continued with radiation to right thoracic back, nausea, epigastric pain. Negative CT in April 2020, never completed HIDA scan as ordered last year. Given grossly negative gall bladder work up we will defer her back to her GI doctor for evaluation. Especially given chronic nausea and the need to have Zofran frequently.  She is stable for outpatient treatment, no acute distress.

## 2019-07-04 NOTE — Patient Instructions (Addendum)
Start Topamax for headache prevention. Take 1 tablet by mouth at bedtime.  You may take hydroxyzine as needed for anxiety/panic attacks. This may cause drowsiness.   Please update me regarding your headaches and panic attacks.  Follow up with your GI doctor for your abdominal pain.  Schedule a follow up visit with me for 2 months from now for diabetes check.  It was a pleasure to see you today!

## 2019-07-04 NOTE — Assessment & Plan Note (Signed)
Increased and nearly daily symptoms of anxiety. Recommended we change her anxiety regimen given that fluoxetine isn't best for increased anxiety, she declines.  Offered to add Buspar for daily anxiety, she declines and wants Xanax. Discussed that I do not prescribe benzo medications due to risk for dependence. She agreed to PRN hydroxyzine, discussed drowsiness precautions.   I also offered referral for therapy for which she declines. She will update.

## 2019-07-04 NOTE — Telephone Encounter (Signed)
Pt left v/m that pt was just seen by Gentry Fitz NP and pt forgot to ask if Gentry Fitz NP thinks pt should have the covid vaccine or not with pts medical hx. Pt request cb.

## 2019-07-14 ENCOUNTER — Other Ambulatory Visit: Payer: Self-pay

## 2019-07-14 ENCOUNTER — Telehealth (INDEPENDENT_AMBULATORY_CARE_PROVIDER_SITE_OTHER): Payer: 59 | Admitting: Family Medicine

## 2019-07-14 ENCOUNTER — Encounter: Payer: Self-pay | Admitting: Family Medicine

## 2019-07-14 ENCOUNTER — Telehealth: Payer: Self-pay

## 2019-07-14 ENCOUNTER — Other Ambulatory Visit: Payer: Self-pay | Admitting: Family Medicine

## 2019-07-14 VITALS — Ht 62.0 in | Wt 203.0 lb

## 2019-07-14 DIAGNOSIS — J309 Allergic rhinitis, unspecified: Secondary | ICD-10-CM

## 2019-07-14 DIAGNOSIS — R0981 Nasal congestion: Secondary | ICD-10-CM | POA: Diagnosis not present

## 2019-07-14 DIAGNOSIS — R05 Cough: Secondary | ICD-10-CM | POA: Diagnosis not present

## 2019-07-14 DIAGNOSIS — R059 Cough, unspecified: Secondary | ICD-10-CM

## 2019-07-14 MED ORDER — HYDROCOD POLST-CPM POLST ER 10-8 MG/5ML PO SUER: 5.0000 mL | Freq: Two times a day (BID) | ORAL | 0 refills | Status: DC | PRN

## 2019-07-14 MED ORDER — ALBUTEROL SULFATE HFA 108 (90 BASE) MCG/ACT IN AERS: 1.0000 | INHALATION_SPRAY | Freq: Four times a day (QID) | RESPIRATORY_TRACT | 1 refills | Status: DC | PRN

## 2019-07-14 MED ORDER — ALBUTEROL SULFATE HFA 108 (90 BASE) MCG/ACT IN AERS: 2.0000 | INHALATION_SPRAY | Freq: Four times a day (QID) | RESPIRATORY_TRACT | 0 refills | Status: DC | PRN

## 2019-07-14 NOTE — Telephone Encounter (Signed)
Please call patient and let her know that I was notified by the pharmacy that she is taking oral medications for pain.  I have canceled her prescription for narcotic cough medication due to concerns for breathing suppression related to too much medication.  She can take over-the-counter cough suppressants as well as continue using Tessalon Perles.

## 2019-07-14 NOTE — Telephone Encounter (Signed)
Received fax from [place] stating that Ventolin in haler not covered and it needs to be Proair or Proventil. Please review.

## 2019-07-14 NOTE — Telephone Encounter (Signed)
Mr Andree Elk at [facility] left v/m; received Tussionex rx and Mr Andree Elk wants to know if Glenda Chroman FNP wants to add Tussionex since pt is on Hydrocodone for pain from another provider. Mr Andree Elk request cb.

## 2019-07-14 NOTE — Telephone Encounter (Signed)
Patient aware and will do as directed. No concerns at this time.

## 2019-07-14 NOTE — Progress Notes (Signed)
Virtual Visit via Video Note  I connected with [place] on 07/14/19 at 12:00 PM EDT by a video enabled telemedicine application and verified that I am speaking with the correct person using two identifiers.  Location: Patient: In her home Provider: [place] [facility] participating in virtual visit: Patient and provider   I discussed the limitations of evaluation and management by telemedicine and the availability of in person appointments. The patient expressed understanding and agreed to proceed.  History of Present Illness: Chief Complaint  Patient presents with  . Cough    cough, nasal congestion, runny nose, sore throat, headache symptoms x 2 days felt little better yesterday but came back this morning and sent home from work.   This is a 54 year old female who presents today for virtual visit for above chief complaint. 8 days ago she received her first Covid vaccine. 7 days ago she was exposed to her granddaughter who had a cough. 3 days ago she started to have a sore throat and headache around top of head. Yesterday she felt pretty good and went to work. Today she has noticed return of her cough if she gets hot or is lying down. She has some intermittent wheeze and has been using her albuterol inhaler. Denies shortness of breath. She has some sputum production of thick yellow. She also has nasal drainage of the same characteristics. She does have some postnasal drainage. Her ears feel stopped up. She does not have a fever. She is not a smoker. She has type 2 diabetes and has been checking her blood sugars. Over the last several days they have been running 105-147. She had some leftover prescription strength cough syrup as well as Tessalon Perles which have helped some with cough. She has been taking Advil allergy and Nasacort with temporary relief of symptoms. She does report some seasonal allergies in the springtime.    Observations/Objective: Patient is alert and  answers questions appropriately. Visible skin is unremarkable. Respirations are even and unlabored without increased work of breathing with talking or ambulation. No audible wheeze or witnessed cough. Mood and affect are appropriate. Ht 5' 2"  (1.575 m)   Wt 203 lb (92.1 kg) Comment: per pt  LMP 08/12/2015 Comment: patient states hx. tubal ligation  BMI 37.13 kg/m  Wt Readings from Last 3 Encounters:  07/14/19 203 lb (92.1 kg)  07/04/19 201 lb 4 oz (91.3 kg)  04/07/19 204 lb 3 oz (92.6 kg)    Assessment and Plan: 1. Allergic rhinitis, unspecified seasonality, unspecified trigger -Suggested that she continue Nasacort nasal spray and add long-acting antihistamine - albuterol (VENTOLIN HFA) 108 (90 Base) MCG/ACT inhaler; Inhale 1-2 puffs into the lungs every 6 (six) hours as needed for wheezing or shortness of breath (cough).  Dispense: 18 g; Refill: 1  2. Cough -Uncertain etiology, could be viral or from seasonal allergies. I did recommend that she get tested for COVID-19 and she was agreeable. -Refill of cough medication sent to patient's pharmacy and encouraged her to increase fluids, rest -Follow-up precautions reviewed.  3. Cough in adult - chlorpheniramine-HYDROcodone (TUSSIONEX PENNKINETIC ER) 10-8 MG/5ML SUER; Take 5 mLs by mouth every 12 (twelve) hours as needed for cough.  Dispense: 70 mL; Refill: 0  4. Nasal congestion - Per number 1    Clarene Reamer, FNP-BC  [facility]  07/14/2019 12:44 PM   Follow Up Instructions:    I discussed the assessment and treatment plan with the  patient. The patient was provided an opportunity to ask questions and all were answered. The patient agreed with the plan and demonstrated an understanding of the instructions.   The patient was advised to call back or seek an in-person evaluation if the symptoms worsen or if the condition fails to improve as anticipated.   Elby Beck,  FNP

## 2019-07-14 NOTE — Telephone Encounter (Signed)
New prescription sent to patient's pharmacy.

## 2019-07-19 ENCOUNTER — Telehealth (INDEPENDENT_AMBULATORY_CARE_PROVIDER_SITE_OTHER): Payer: 59 | Admitting: Primary Care

## 2019-07-19 DIAGNOSIS — J309 Allergic rhinitis, unspecified: Secondary | ICD-10-CM

## 2019-07-19 MED ORDER — PREDNISONE 20 MG PO TABS: ORAL_TABLET | ORAL | 0 refills | Status: DC

## 2019-07-19 NOTE — Patient Instructions (Signed)
Start prednisone. Take 2 tablets daily for five days.  Hold the nasal spray for now.  Continue the antihistamine medication for allergies. Continue Nexium for heartburn.  Please update me early next week if no improvement.  It was a pleasure to see you today! [name], NP-C

## 2019-07-19 NOTE — Progress Notes (Signed)
Subjective:    Patient ID: Abigail [name], female    DOB: 1965/08/06, 54 y.o.   MRN: [id number]  HPI  Virtual Visit via Video Note  I connected with [name] on 07/19/19 at  3:20 PM EDT by a video enabled telemedicine application and verified that I am speaking with the correct person using two identifiers.  Location: Patient: Mus[facility] Provider: Office   I discussed the limitations of evaluation and management by telemedicine and the availability of in person appointments. The patient expressed understanding and agreed to proceed.  History of Present Illness:  Abigail [name] is a 54 year old female with a history of allergic rhinitis, CAP, hypothyroidism, esophageal reflux, type 2 diabetes, suspected sleep apnea, chronic neck and back pain who presents today with a chief complaint of cough.  She also reports nasal congestion, throat congestion. Her symptoms began about one week ago. She denies fevers, loss of taste/smell, diarrhea.  She was evaluated on 07/14/19 by Tor Netters, at that time her cough began two days prior. She was diagnosed with allergic rhinitis and was provided with a refill of her inhaler and Tussionex. It was recommended she get Covid tested.   Overall she feels well, but the coughing spells are bothersome. She did not get tested for Covid-19 during her last visit as she doesn't believe she has Covid-19. She had her first Covid-19 vaccine on 07/06/19.   She is using her Nasoqart nasal spray, Tussionex, albuterol, and Advil and Allergy without much improvement in cough. She is compliant to her Nexium 40 mg daily.    Observations/Objective:  Alert and oriented. Appears well, not sickly. No distress. Speaking in complete sentences. No cough.  Assessment and Plan:  See problem based charting.  Follow Up Instructions:  Start prednisone. Take 2 tablets daily for five days.  Hold the nasal spray for now.  Continue the antihistamine medication  for allergies. Continue Nexium for heartburn.  Please update me early next week if no improvement.  It was a pleasure to see you today! [name], NP-C    I discussed the assessment and treatment plan with the patient. The patient was provided an opportunity to ask questions and all were answered. The patient agreed with the plan and demonstrated an understanding of the instructions.   The patient was advised to call back or seek an in-person evaluation if the symptoms worsen or if the condition fails to improve as anticipated.    [name], NP    Review of Systems  Constitutional: Negative for chills, fatigue and fever.  HENT: Positive for congestion and postnasal drip. Negative for sinus pressure and sore throat.   Respiratory: Positive for cough. Negative for shortness of breath.   Allergic/Immunologic: Positive for environmental allergies.       Past Medical History:  Diagnosis Date  . Anxiety   . Bulging lumbar disc   . CAP (community acquired pneumonia)   . Cardiac arrhythmia due to congenital heart disease    no arrhythmia identified   . Celiac disease   . Depression   . Diverticulosis    Sigmoid colon  . Fatty liver 05/2016  . Frequent headaches   . GERD (gastroesophageal reflux disease)   . Heart disease   . History of kidney stones   . Hypertension   . Kidney stones   . Migraine   . Obesity (BMI 35.0-39.9 without comorbidity)   . UTI (urinary tract infection)      Social History  Socioeconomic History  . Marital status: Single    Spouse name: Not on file  . Number of children: Not on file  . Years of education: Not on file  . Highest education level: Not on file  Occupational History  . Not on file  Tobacco Use  . Smoking status: Former Smoker    Types: Cigarettes    Quit date: 2008    Years since quitting: 13.3  . Smokeless tobacco: Never Used  . Tobacco comment: used tobacco "socially"  Substance and Sexual Activity  . Alcohol  use: No    Alcohol/week: 0.0 standard drinks  . Drug use: No  . Sexual activity: Not on file  Other Topics Concern  . Not on file  Social History Narrative   Single.   Has a set of Twins, age 43.   Works for the [employer] and [employer] and [employer]   Enjoys relaxing, spending time with her children.   Social Determinants of Health   Financial Resource Strain:   . Difficulty of Paying Living Expenses:   Food Insecurity:   . Worried About Charity fundraiser in the Last Year:   . Arboriculturist in the Last Year:   Transportation Needs:   . Film/video editor (Medical):   [name] Lack of Transportation (Non-Medical):   Physical Activity:   . Days of Exercise per Week:   . Minutes of Exercise per Session:   Stress:   . Feeling of Stress :   Social Connections:   . Frequency of Communication with Friends and Family:   . Frequency of Social Gatherings with Friends and Family:   . Attends Religious Services:   . Active Member of Clubs or Organizations:   . Attends Archivist Meetings:   [name] Marital Status:   Intimate Partner Violence:   . Fear of Current or Ex-Partner:   . Emotionally Abused:   [name] Physically Abused:   . Sexually Abused:     Past Surgical History:  Procedure Laterality Date  . ABDOMINAL EXPLORATION SURGERY  1986  . COLONOSCOPY  06/2016  . EXTRACORPOREAL SHOCK WAVE LITHOTRIPSY Left 12/03/2016   Procedure: LEFT EXTRACORPOREAL SHOCK WAVE LITHOTRIPSY (ESWL);  Surgeon: Irine Seal, MD;  Location: [facility];  Service: Urology;  Laterality: Left;  . KIDNEY STONE SURGERY    . LEFT HEART CATH AND CORONARY ANGIOGRAPHY  07/28/2016    Large tortuous coronary arteries no radiographic evidence of disease.  Not anginal chest pain.  Normal EF.  [name] LITHOTRIPSY    . NASAL SINUS SURGERY    . ROTATOR CUFF REPAIR Right 12/27/2017  . TRANSTHORACIC ECHOCARDIOGRAM  06/2017   EF 60-65%.  GR 1-2 DD.  Otherwise normal echo.  Normal valve function.  Normal wall motion.    . TUBAL LIGATION      Family History  Problem Relation Age of Onset  . COPD Father   . Emphysema Father   . Gallstones Mother   . Colon cancer Neg Hx   . Stomach cancer Neg Hx   . Esophageal cancer Neg Hx     Allergies  Allergen Reactions  . Penicillins Nausea Only    Has patient had a PCN reaction causing immediate rash, facial/tongue/throat swelling, SOB or lightheadedness with hypotension: No Has patient had a PCN reaction causing severe rash involving mucus membranes or skin necrosis: No Has patient had a PCN reaction that required hospitalization: No Has patient had a PCN reaction occurring within the last  10 years: No If all of the above answers are "NO", then may proceed with Cephalosporin use.   . Latex Rash    Current Outpatient Medications on File Prior to Visit  Medication Sig Dispense Refill  . Accu-Chek FastClix Lancets MISC USE 1 LANCET TO CHECK BLOOD SUGAR THREE TIMES DAILY 102 each 5  . ACCU-CHEK GUIDE test strip USE 1 STRIP   THREE TIMES DAILY 100 each 5  . albuterol (PROAIR HFA) 108 (90 Base) MCG/ACT inhaler Inhale 2 puffs into the lungs every 6 (six) hours as needed for wheezing or shortness of breath. 8 g 0  . Blood Glucose Monitoring Suppl (ACCU-CHEK GUIDE) w/Device KIT by Does not apply route.    [name] esomeprazole (NEXIUM) 40 MG capsule TAKE 1 CAPSULE BY MOUTH DAILY 90 capsule 1  . FLUoxetine (PROZAC) 40 MG capsule Take 1qd (plz sched physical in June) 90 capsule 0  . hydrOXYzine (ATARAX/VISTARIL) 10 MG tablet Take 1 to 2 tablets by mouth twice daily as needed for anxiety. 30 tablet 0  . levothyroxine (SYNTHROID) 75 MCG tablet TAKE ONE TABLET BY MOUTH ONCE DAILY IN THE MORNING ON AN EMPTY STOMACH WITH WATER. NO FOOD OR OTHER MEDICATIONS FOR 30 MINUTES 90 tablet 2  . linaclotide (LINZESS) 72 MCG capsule TAKE 1 CAPSULE BY MOUTH ONCE DAILY BEFORE BREAKFAST 90 capsule 3  . metFORMIN (GLUCOPHAGE) 500 MG tablet TAKE 1 TABLET BY MOUTH TWICE DAILY WITH FOOD FOR   DIABETES 180 tablet 1  . methocarbamol (ROBAXIN) 500 MG tablet Take 500 mg by mouth 3 (three) times daily.     . metoprolol tartrate (LOPRESSOR) 25 MG tablet Take 1 tablet by mouth twice daily 180 tablet 3  . ondansetron (ZOFRAN ODT) 8 MG disintegrating tablet Take 1 tablet (8 mg total) by mouth every 8 (eight) hours as needed for nausea or vomiting. 20 tablet 1  . oxybutynin (DITROPAN-XL) 5 MG 24 hr tablet Take 5 mg by mouth daily.  1  . Peppermint Oil (IBGARD PO) Take by mouth.    . rosuvastatin (CRESTOR) 20 MG tablet Take 1 tablet by mouth once daily 30 tablet 7  . topiramate (TOPAMAX) 50 MG tablet Take 1 tablet (50 mg total) by mouth at bedtime. For headache prevention. 30 tablet 0  . traMADol (ULTRAM) 50 MG tablet Take 50 mg by mouth every morning.      No current facility-administered medications on file prior to visit.    LMP 08/12/2015 Comment: patient states hx. tubal ligation   Objective:   Physical Exam  Constitutional: She is oriented to person, place, and time. She appears well-nourished. She does not appear ill.  Respiratory: Effort normal.  No cough during visit  Neurological: She is alert and oriented to person, place, and time.  Psychiatric: She has a normal mood and affect.           Assessment & Plan:

## 2019-07-19 NOTE — Assessment & Plan Note (Signed)
Symptoms today likely allergy related, also could be aggravating GERD with cough.  She doesn't appear sickly, has had first Covid-19 vaccine, refuses to be tested.  Continue antihistamine. Rx for Prednisone course provided for nasal and throat congestion, this has been effective for her in the past. She will update.

## 2019-07-26 ENCOUNTER — Telehealth: Payer: Self-pay | Admitting: Primary Care

## 2019-07-26 NOTE — Telephone Encounter (Signed)
She needs to be seen in person, please have her scheduled for a visit with me.

## 2019-07-26 NOTE — Telephone Encounter (Signed)
Patient called She stated that she was prescribed prednisone last week and it is not helping her symptoms. She said she still has the cough and congestion in her chest. She would like to know what she should do now since the medication has not helped.

## 2019-07-27 NOTE — Telephone Encounter (Signed)
Pt has been scheduled to see you tomorrow.

## 2019-07-28 ENCOUNTER — Encounter: Payer: Self-pay | Admitting: Primary Care

## 2019-07-28 ENCOUNTER — Other Ambulatory Visit: Payer: Self-pay

## 2019-07-28 ENCOUNTER — Ambulatory Visit: Payer: 59 | Admitting: Primary Care

## 2019-07-28 ENCOUNTER — Ambulatory Visit (INDEPENDENT_AMBULATORY_CARE_PROVIDER_SITE_OTHER)
Admission: RE | Admit: 2019-07-28 | Discharge: 2019-07-28 | Disposition: A | Discharge status: Home / Self Care | Payer: 59 | Source: Ambulatory Visit | Attending: Primary Care | Admitting: Primary Care

## 2019-07-28 ENCOUNTER — Other Ambulatory Visit: Payer: Self-pay | Admitting: Primary Care

## 2019-07-28 VITALS — BP 128/78 | HR 95 | Temp 96.8°F | Ht 62.0 in | Wt 200.5 lb

## 2019-07-28 DIAGNOSIS — J309 Allergic rhinitis, unspecified: Secondary | ICD-10-CM

## 2019-07-28 DIAGNOSIS — R05 Cough: Secondary | ICD-10-CM

## 2019-07-28 DIAGNOSIS — R059 Cough, unspecified: Secondary | ICD-10-CM

## 2019-07-28 MED ORDER — MONTELUKAST SODIUM 10 MG PO TABS: 10.0000 mg | ORAL_TABLET | Freq: Every day | ORAL | 0 refills | Status: DC

## 2019-07-28 NOTE — Progress Notes (Signed)
Subjective:    Patient ID: Abigail [name], female    DOB: Jun 26, 1965, 54 y.o.   MRN: [id number]  HPI  This visit occurred during the SARS-CoV-2 public health emergency.  Safety protocols were in place, including screening questions prior to the visit, additional usage of staff PPE, and extensive cleaning of exam room while observing appropriate contact time as indicated for disinfecting solutions.   Abigail [name] is a 54 year old female with a history of type 2 diabetes, allergic rhinitis, obesity, hypothyroidism, CAP who presents today   She was initially evaluated on 07/14/19 by Tor Netters for complaints of a two day history of cough. Diagnosed with allergic rhinitis and recommended Covid-19 testing. She was evaluated again on 07/19/19 by myself with reports of feeling well but with coughing spells. She never tested for Covid-19 infection as she had her initial Covid vaccine on 07/06/19. She was treated with prednisone tablets and other conservative treatment.    Since her last evaluation she continues to experience coughing spells, nasal congestion, post nasal drip. Feels like she'll cough but can't get anything up. She's using the Tessalon Perles, Albuterol Inhaler, Advil Allergy with temporary improvement. She completed prednisone as prescribed and is overall she's slightly better.   She denies a history of asthma. She is a non smoker.   Review of Systems  Constitutional: Negative for chills, fatigue and fever.  HENT: Positive for congestion and postnasal drip. Negative for sinus pressure and sore throat.   Respiratory: Positive for cough and chest tightness. Negative for shortness of breath.   Allergic/Immunologic: Positive for environmental allergies.       Past Medical History:  Diagnosis Date  . Anxiety   . Bulging lumbar disc   . CAP (community acquired pneumonia)   . Cardiac arrhythmia due to congenital heart disease    no arrhythmia identified   . Celiac disease    . Depression   . Diverticulosis    Sigmoid colon  . Fatty liver 05/2016  . Frequent headaches   . GERD (gastroesophageal reflux disease)   . Heart disease   . History of kidney stones   . Hypertension   . Kidney stones   . Migraine   . Obesity (BMI 35.0-39.9 without comorbidity)   . UTI (urinary tract infection)      Social History   Socioeconomic History  . Marital status: Single    Spouse name: Not on file  . Number of children: Not on file  . Years of education: Not on file  . Highest education level: Not on file  Occupational History  . Not on file  Tobacco Use  . Smoking status: Former Smoker    Types: Cigarettes    Quit date: 2008    Years since quitting: 13.3  . Smokeless tobacco: Never Used  . Tobacco comment: used tobacco "socially"  Substance and Sexual Activity  . Alcohol use: No    Alcohol/week: 0.0 standard drinks  . Drug use: No  . Sexual activity: Not on file  Other Topics Concern  . Not on file  Social History Narrative   Single.   Has a set of Twins, age 15.   Works for the [employer] and [employer] and [employer]   Enjoys relaxing, spending time with her children.   Social Determinants of Health   Financial Resource Strain:   . Difficulty of Paying Living Expenses:   Food Insecurity:   . Worried About Charity fundraiser in the  Last Year:   . [place] in the Last Year:   Transportation Needs:   . Film/video editor (Medical):   [name] Lack of Transportation (Non-Medical):   Physical Activity:   . Days of Exercise per Week:   . Minutes of Exercise per Session:   Stress:   . Feeling of Stress :   Social Connections:   . Frequency of Communication with Friends and Family:   . Frequency of Social Gatherings with Friends and Family:   . Attends Religious Services:   . Active Member of Clubs or Organizations:   . Attends Archivist Meetings:   [name] Marital Status:   Intimate Partner Violence:   . Fear of Current or  Ex-Partner:   . Emotionally Abused:   [name] Physically Abused:   . Sexually Abused:     Past Surgical History:  Procedure Laterality Date  . ABDOMINAL EXPLORATION SURGERY  1986  . COLONOSCOPY  06/2016  . EXTRACORPOREAL SHOCK WAVE LITHOTRIPSY Left 12/03/2016   Procedure: LEFT EXTRACORPOREAL SHOCK WAVE LITHOTRIPSY (ESWL);  Surgeon: Irine Seal, MD;  Location: [facility];  Service: Urology;  Laterality: Left;  . KIDNEY STONE SURGERY    . LEFT HEART CATH AND CORONARY ANGIOGRAPHY  07/28/2016    Large tortuous coronary arteries no radiographic evidence of disease.  Not anginal chest pain.  Normal EF.  [name] LITHOTRIPSY    . NASAL SINUS SURGERY    . ROTATOR CUFF REPAIR Right 12/27/2017  . TRANSTHORACIC ECHOCARDIOGRAM  06/2017   EF 60-65%.  GR 1-2 DD.  Otherwise normal echo.  Normal valve function.  Normal wall motion.  . TUBAL LIGATION      Family History  Problem Relation Age of Onset  . COPD Father   . Emphysema Father   . Gallstones Mother   . Colon cancer Neg Hx   . Stomach cancer Neg Hx   . Esophageal cancer Neg Hx     Allergies  Allergen Reactions  . Penicillins Nausea Only    Has patient had a PCN reaction causing immediate rash, facial/tongue/throat swelling, SOB or lightheadedness with hypotension: No Has patient had a PCN reaction causing severe rash involving mucus membranes or skin necrosis: No Has patient had a PCN reaction that required hospitalization: No Has patient had a PCN reaction occurring within the last 10 years: No If all of the above answers are "NO", then may proceed with Cephalosporin use.   . Latex Rash    Current Outpatient Medications on File Prior to Visit  Medication Sig Dispense Refill  . Accu-Chek FastClix Lancets MISC USE 1 LANCET TO CHECK BLOOD SUGAR THREE TIMES DAILY 102 each 5  . ACCU-CHEK GUIDE test strip USE 1 STRIP   THREE TIMES DAILY 100 each 5  . albuterol (PROAIR HFA) 108 (90 Base) MCG/ACT inhaler Inhale 2 puffs into the lungs every 6 (six)  hours as needed for wheezing or shortness of breath. 8 g 0  . Blood Glucose Monitoring Suppl (ACCU-CHEK GUIDE) w/Device KIT by Does not apply route.    [name] esomeprazole (NEXIUM) 40 MG capsule TAKE 1 CAPSULE BY MOUTH DAILY 90 capsule 1  . FLUoxetine (PROZAC) 40 MG capsule Take 1qd (plz sched physical in June) 90 capsule 0  . hydrOXYzine (ATARAX/VISTARIL) 10 MG tablet Take 1 to 2 tablets by mouth twice daily as needed for anxiety. 30 tablet 0  . levothyroxine (SYNTHROID) 75 MCG tablet TAKE ONE TABLET BY MOUTH ONCE DAILY IN THE MORNING ON AN EMPTY  STOMACH WITH WATER. NO FOOD OR OTHER MEDICATIONS FOR 30 MINUTES 90 tablet 2  . linaclotide (LINZESS) 72 MCG capsule TAKE 1 CAPSULE BY MOUTH ONCE DAILY BEFORE BREAKFAST 90 capsule 3  . metFORMIN (GLUCOPHAGE) 500 MG tablet TAKE 1 TABLET BY MOUTH TWICE DAILY WITH FOOD FOR  DIABETES 180 tablet 1  . methocarbamol (ROBAXIN) 500 MG tablet Take 500 mg by mouth 3 (three) times daily.     . metoprolol tartrate (LOPRESSOR) 25 MG tablet Take 1 tablet by mouth twice daily 180 tablet 3  . ondansetron (ZOFRAN ODT) 8 MG disintegrating tablet Take 1 tablet (8 mg total) by mouth every 8 (eight) hours as needed for nausea or vomiting. 20 tablet 1  . oxybutynin (DITROPAN-XL) 5 MG 24 hr tablet Take 5 mg by mouth daily.  1  . Peppermint Oil (IBGARD PO) Take by mouth.    . rosuvastatin (CRESTOR) 20 MG tablet Take 1 tablet by mouth once daily 30 tablet 7  . topiramate (TOPAMAX) 50 MG tablet Take 1 tablet (50 mg total) by mouth at bedtime. For headache prevention. 30 tablet 0  . traMADol (ULTRAM) 50 MG tablet Take 50 mg by mouth every morning.      No current facility-administered medications on file prior to visit.    BP 128/78   Pulse 95   Temp (!) 96.8 F (36 C) (Temporal)   Ht 5' 2"  (1.575 m)   Wt 200 lb 8 oz (90.9 kg)   LMP 08/12/2015 Comment: patient states hx. tubal ligation  SpO2 97%   BMI 36.67 kg/m    Objective:   Physical Exam  Constitutional: She appears  well-nourished. She does not have a sickly appearance.  Cardiovascular: Normal rate and regular rhythm.  Respiratory: Effort normal and breath sounds normal. She has no wheezes.  Musculoskeletal:     Cervical back: Neck supple.  Skin: Skin is warm and dry.           Assessment & Plan:

## 2019-07-28 NOTE — Patient Instructions (Signed)
Complete xray(s) prior to leaving today. I will notify you of your results once received.  It was a pleasure to see you today!

## 2019-07-28 NOTE — Assessment & Plan Note (Signed)
Continued, slightly better today. Exam with clear lungs, she doesn't appear sickly. She's completed the Covid-19 vaccine series.   Checking chest xray today. Consider ICS inhaler due to persistent cough. Continue antihistamines.

## 2019-08-04 ENCOUNTER — Other Ambulatory Visit (INDEPENDENT_AMBULATORY_CARE_PROVIDER_SITE_OTHER): Payer: 59

## 2019-08-04 ENCOUNTER — Encounter: Payer: Self-pay | Admitting: Gastroenterology

## 2019-08-04 ENCOUNTER — Ambulatory Visit (INDEPENDENT_AMBULATORY_CARE_PROVIDER_SITE_OTHER): Payer: 59 | Admitting: Gastroenterology

## 2019-08-04 VITALS — BP 104/66 | HR 85 | Ht 62.0 in | Wt 198.0 lb

## 2019-08-04 DIAGNOSIS — R1011 Right upper quadrant pain: Secondary | ICD-10-CM

## 2019-08-04 DIAGNOSIS — K7581 Nonalcoholic steatohepatitis (NASH): Secondary | ICD-10-CM

## 2019-08-04 DIAGNOSIS — K5904 Chronic idiopathic constipation: Secondary | ICD-10-CM

## 2019-08-04 DIAGNOSIS — K219 Gastro-esophageal reflux disease without esophagitis: Secondary | ICD-10-CM | POA: Diagnosis not present

## 2019-08-04 DIAGNOSIS — K449 Diaphragmatic hernia without obstruction or gangrene: Secondary | ICD-10-CM

## 2019-08-04 DIAGNOSIS — K760 Fatty (change of) liver, not elsewhere classified: Secondary | ICD-10-CM

## 2019-08-04 LAB — HEPATIC FUNCTION PANEL
ALT: 42 U/L — ABNORMAL HIGH (ref 0–35)
AST: 37 U/L (ref 0–37)
Albumin: 4.2 g/dL (ref 3.5–5.2)
Alkaline Phosphatase: 110 U/L (ref 39–117)
Bilirubin, Direct: 0.1 mg/dL (ref 0.0–0.3)
Total Bilirubin: 0.8 mg/dL (ref 0.2–1.2)
Total Protein: 7.1 g/dL (ref 6.0–8.3)

## 2019-08-04 MED ORDER — LINACLOTIDE 145 MCG PO CAPS: 145.0000 ug | ORAL_CAPSULE | Freq: Every day | ORAL | 3 refills | Status: DC

## 2019-08-04 NOTE — Progress Notes (Signed)
Abigail [name]    [id number]    Aug 15, 1965  Primary Care Physician:Clark, Leticia Penna, NP  Referring Physician: [name], NP [place],  [place] [address]   Chief complaint:  RUQ abd pain  HPI:  54 year old female with history of diabetes, obesity, fatty liver here for follow-up visit with complaints of right upper quadrant abdominal pain  On and off for past few years, worse in the past 2 years.  She has a right side pain under the rib cage   Hepatic Function Latest Ref Rng & Units 04/07/2019 11/30/2018 07/01/2018  Total Protein 6.0 - 8.3 g/dL 6.9 6.4 6.6  Albumin 3.5 - 5.2 g/dL 3.9 3.9 3.5  AST 0 - 37 U/L 97(H) 108(H) 74(H)  ALT 0 - 35 U/L 88(H) 87(H) 72(H)  Alk Phosphatase 39 - 117 U/L 99 123(H) 93  Total Bilirubin 0.2 - 1.2 mg/dL 0.9 0.7 0.5  Bilirubin, Direct 0.00 - 0.40 mg/dL - - -   Bowel habits are regular with constipation, is taking Linzess  72 mcg and also takes stool softener with bowel movements every day or every other day  No vomiting, melena or blood per rectum. Complains of regurgitation and reflux symptoms, has to sleep in a recliner otherwise she wakes up choking She is trying to lose weight but has not been very successful  Colonoscopy June 30, 2016 with removal of 3 mm sessile serrated adenomatous polyp from cecum, sigmoid diverticulosis and internal hemorrhoids.  EGD June 30, 2016: Small hiatal hernia otherwise normal exam.  Duodenal biopsies showed benign mucosa negative for celiac.  Right upper quadrant abdominal ultrasound December 31, 2017: Showed chronic hepatic steatosis otherwise no acute abnormality.  CT renal study July 01, 2018: Punctate 1 to 2 mm right UVJ stone and mild fullness in the right renal collecting system.  Bilateral punctate nephrolithiasis.  Fatty liver.  Outpatient Encounter Medications as of 08/04/2019  Medication Sig  . Accu-Chek FastClix Lancets MISC USE 1 LANCET TO CHECK BLOOD SUGAR  THREE TIMES DAILY  . ACCU-CHEK GUIDE test strip USE 1 STRIP   THREE TIMES DAILY  . albuterol (PROAIR HFA) 108 (90 Base) MCG/ACT inhaler Inhale 2 puffs into the lungs every 6 (six) hours as needed for wheezing or shortness of breath.  . Blood Glucose Monitoring Suppl (ACCU-CHEK GUIDE) w/Device KIT by Does not apply route.  [name] esomeprazole (NEXIUM) 40 MG capsule TAKE 1 CAPSULE BY MOUTH DAILY  . FLUoxetine (PROZAC) 40 MG capsule Take 1qd (plz sched physical in June)  . hydrOXYzine (ATARAX/VISTARIL) 10 MG tablet Take 1 to 2 tablets by mouth twice daily as needed for anxiety.  [name] levothyroxine (SYNTHROID) 75 MCG tablet TAKE ONE TABLET BY MOUTH ONCE DAILY IN THE MORNING ON AN EMPTY STOMACH WITH WATER. NO FOOD OR OTHER MEDICATIONS FOR 30 MINUTES  . linaclotide (LINZESS) 72 MCG capsule TAKE 1 CAPSULE BY MOUTH ONCE DAILY BEFORE BREAKFAST  . metFORMIN (GLUCOPHAGE) 500 MG tablet TAKE 1 TABLET BY MOUTH TWICE DAILY WITH FOOD FOR  DIABETES  . methocarbamol (ROBAXIN) 500 MG tablet Take 500 mg by mouth 3 (three) times daily.   . metoprolol tartrate (LOPRESSOR) 25 MG tablet Take 1 tablet by mouth twice daily  . montelukast (SINGULAIR) 10 MG tablet Take 1 tablet (10 mg total) by mouth at bedtime. For allergies.  [name] ondansetron (ZOFRAN ODT) 8 MG disintegrating tablet Take 1 tablet (8 mg total) by mouth every 8 (  eight) hours as needed for nausea or vomiting.  [name] oxybutynin (DITROPAN-XL) 5 MG 24 hr tablet Take 5 mg by mouth daily.  [name] Peppermint Oil (IBGARD PO) Take by mouth.  . rosuvastatin (CRESTOR) 20 MG tablet Take 1 tablet by mouth once daily  . topiramate (TOPAMAX) 50 MG tablet Take 1 tablet (50 mg total) by mouth at bedtime. For headache prevention.  . traMADol (ULTRAM) 50 MG tablet Take 50 mg by mouth every morning.    No facility-administered encounter medications on file as of 08/04/2019.    Allergies as of 08/04/2019 - Review Complete 08/04/2019  Allergen Reaction Noted  . Penicillins Nausea Only 12/29/2017   . Latex Rash 11/27/2016    Past Medical History:  Diagnosis Date  . Anxiety   . Bulging lumbar disc   . CAP (community acquired pneumonia)   . Cardiac arrhythmia due to congenital heart disease    no arrhythmia identified   . Celiac disease   . Depression   . Diverticulosis    Sigmoid colon  . Fatty liver 05/2016  . Frequent headaches   . GERD (gastroesophageal reflux disease)   . Heart disease   . History of kidney stones   . Hypertension   . Kidney stones   . Migraine   . Obesity (BMI 35.0-39.9 without comorbidity)   . UTI (urinary tract infection)     Past Surgical History:  Procedure Laterality Date  . ABDOMINAL EXPLORATION SURGERY  1986  . COLONOSCOPY  06/2016  . EXTRACORPOREAL SHOCK WAVE LITHOTRIPSY Left 12/03/2016   Procedure: LEFT EXTRACORPOREAL SHOCK WAVE LITHOTRIPSY (ESWL);  Surgeon: Irine Seal, MD;  Location: [facility];  Service: Urology;  Laterality: Left;  . KIDNEY STONE SURGERY    . LEFT HEART CATH AND CORONARY ANGIOGRAPHY  07/28/2016    Large tortuous coronary arteries no radiographic evidence of disease.  Not anginal chest pain.  Normal EF.  [name] LITHOTRIPSY    . NASAL SINUS SURGERY    . ROTATOR CUFF REPAIR Right 12/27/2017  . TRANSTHORACIC ECHOCARDIOGRAM  06/2017   EF 60-65%.  GR 1-2 DD.  Otherwise normal echo.  Normal valve function.  Normal wall motion.  . TUBAL LIGATION      Family History  Problem Relation Age of Onset  . COPD Father   . Emphysema Father   . Gallstones Mother   . Colon cancer Neg Hx   . Stomach cancer Neg Hx   . Esophageal cancer Neg Hx     Social History   Socioeconomic History  . Marital status: Single    Spouse name: Not on file  . Number of children: Not on file  . Years of education: Not on file  . Highest education level: Not on file  Occupational History  . Not on file  Tobacco Use  . Smoking status: Former Smoker    Types: Cigarettes    Quit date: 2008    Years since quitting: 13.4  . Smokeless tobacco:  Never Used  . Tobacco comment: used tobacco "socially"  Substance and Sexual Activity  . Alcohol use: No    Alcohol/week: 0.0 standard drinks  . Drug use: No  . Sexual activity: Not on file  Other Topics Concern  . Not on file  Social History Narrative   Single.   Has a set of Twins, age 56.   Works for the [employer] and [employer] and [employer]   Enjoys relaxing, spending time with her children.   Social Determinants of  Health   Financial Resource Strain:   . Difficulty of Paying Living Expenses:   Food Insecurity:   . Worried About Charity fundraiser in the Last Year:   . Arboriculturist in the Last Year:   Transportation Needs:   . Film/video editor (Medical):   [name] Lack of Transportation (Non-Medical):   Physical Activity:   . Days of Exercise per Week:   . Minutes of Exercise per Session:   Stress:   . Feeling of Stress :   Social Connections:   . Frequency of Communication with Friends and Family:   . Frequency of Social Gatherings with Friends and Family:   . Attends Religious Services:   . Active Member of Clubs or Organizations:   . Attends Archivist Meetings:   [name] Marital Status:   Intimate Partner Violence:   . Fear of Current or Ex-Partner:   . Emotionally Abused:   [name] Physically Abused:   . Sexually Abused:       Review of systems: All other review of systems negative except as mentioned in the HPI.   Physical Exam: Vitals:   08/04/19 1457  BP: 104/66  Pulse: 85  SpO2: 97%   Body mass index is 36.21 kg/m. Gen:      No acute distress Abd:      soft, non-tender; no palpable masses, no distension Ext:    No edema Neuro: alert and oriented x 3 Psych: normal mood and affect  Data Reviewed:  Reviewed labs, radiology imaging, old records and pertinent past GI work up   Assessment and Plan/Recommendations:  54 year old female with history of obesity, type 2 diabetes, hiatal hernia, GERD, chronic idiopathic constipation,  fatty liver with right upper quadrant pain  Right upper quadrant pain likely secondary to fatty liver but will also need to exclude gallbladder disease  Obtain right upper quadrant abdominal ultrasound with elastography  Fatty liver: Follow-up LFT Advised patient to follow a low-carb low-fat diet and exercise to lose weight Avoid alcohol, over-the-counter herbal remedies and NSAIDs  GERD and hiatal hernia: Persistent symptoms despite PPI therapy, she has regurgitation as predominant symptom Will refer to Dr. Greer Pickerel at [facility] to evaluate for hiatal hernia repair and Nissen fundoplication Obtain esophageal manometry prior to hiatal hernia repair  Chronic idiopathic constipation: Increase Linzess to 145 mcg daily Increase dietary fiber and fluid intake  Return in 3 months or sooner if needed  This visit required 45 minutes of patient care (this includes precharting, chart review, review of results, face-to-face time used for counseling as well as treatment plan and follow-up. The patient was provided an opportunity to ask questions and all were answered. The patient agreed with the plan and demonstrated an understanding of the instructions.  Damaris Hippo , MD    CC: [name], NP

## 2019-08-04 NOTE — Patient Instructions (Addendum)
Your provider has requested that you go to the basement level for lab work before leaving today. Press "B" on the elevator. The lab is located at the first door on the left as you exit the elevator.  We have sent the following medications to your pharmacy for you to pick up at your convenience:  Linzess 139mg  [facility].  You have been [facility] on 08/11/19 at 9:30am. Please arrive 15 minutes prior to your appointment for registration. Make certain not to have anything to eat or drink 6 hours prior to your appointment. Should you need to reschedule your appointment, please contact radiology at [phone] This test typically takes about 30 minutes to perform.  Due to recent COVID-19 restrictions implemented by our local and state authorities and in an effort to keep both patients and staff as safe as possible, our hospital system now requires COVID-19 testing prior to any scheduled hospital procedure. Please [facility] our [facility] testing [facility] ([address] [place] [address] on 09/23/19 at  11:50am. There will be multiple testing areas, the first checkpoint being for pre-procedure/surgery testing. Get into the right (yellow) lane that leads to the PAT testing team. You will not be billed at the time of testing but may receive a bill later depending on your insurance. The approximate cost of the test is $100. You must agree to quarantine from the time of your testing until the procedure date on 09/27/19 . This should include staying at home with ONLY the people you live with. Avoid take-out, grocery store shopping or leaving the house for any non-emergent reason. Failure to have your COVID-19 test done on the date and time you have been scheduled will result in cancellation of procedure. Please call our  office at [phone] you have any questions.    [facility] 10:30am. Please arrive 30 minutes prior to your procedure for registration. You will need to go to outpatient registration (1st floor of the hospital) first. Make certain to bring your insurance cards as well as a complete list of medications.  Please remember the following:  1) Do not take any muscle relaxants, xanax (alprazolam) or ativan for 1 day prior to your test as well as the day of the test.  2) Nothing to eat or drink for 4 hours before your test.  3) Hold all diabetic medications/insulin the morning of the test. You may eat and take your medications after the test.  It will take at least 2 weeks to receive the results of this test from your physician. ------------------------------------------ ABOUT ESOPHAGEAL MANOMETRY Esophageal manometry (muh-NOM-uh-tree) is a test that gauges how well your esophagus works. Your esophagus is the long, muscular tube that connects your throat to your stomach. Esophageal manometry measures the rhythmic muscle contractions (peristalsis) that occur in your esophagus when you swallow. Esophageal manometry also measures the coordination and force exerted by the muscles of your esophagus.  During esophageal manometry, a thin, flexible tube (catheter) that contains sensors is passed through your nose, down your esophagus and into your stomach. Esophageal manometry can be helpful in diagnosing some mostly uncommon disorders that affect your esophagus.  Why it's done Esophageal manometry is used to evaluate the movement (motility) of food through the esophagus and  into the stomach. The test measures how well the circular bands of muscle (sphincters) at the top and bottom of your esophagus open and close, as well as the pressure, strength and pattern of the wave of esophageal muscle contractions that moves food along.   What you can expect Esophageal manometry is an outpatient procedure done without sedation. Most people tolerate it well. You may be asked to change into a hospital gown before the test starts.  During esophageal manometry  . While you are sitting up, a member of your health care team sprays your throat with a numbing medication or puts numbing gel in your nose or both.  . A catheter is guided through your nose into your esophagus. The catheter may be sheathed in a water-filled sleeve. It doesn't interfere with your breathing. However, your eyes may water, and you may gag. You may have a slight nosebleed from irritation.  . After the catheter is in place, you may be asked to lie on your back on an exam table, or you may be asked to remain seated.  . You then swallow small sips of water. As you do, a computer connected to the catheter records the pressure, strength and pattern of your esophageal muscle contractions.  . During the test, you'll be asked to breathe slowly and smoothly, remain as still as possible, and swallow only when you're asked to do so.  . A member of your health care team may move the catheter down into your stomach while the catheter continues its measurements.  . The catheter then is slowly withdrawn. The test usually lasts 20 to 30 minutes.  After esophageal manometry  When your esophageal manometry is complete, you may return to your normal activities  This test typically takes 30-45 minutes to complete. ________________________________________________________________________________

## 2019-08-10 ENCOUNTER — Telehealth: Payer: Self-pay | Admitting: Gastroenterology

## 2019-08-10 NOTE — Telephone Encounter (Signed)
Patient advised of her results.

## 2019-08-11 ENCOUNTER — Other Ambulatory Visit: Payer: Self-pay

## 2019-08-11 ENCOUNTER — Ambulatory Visit (HOSPITAL_COMMUNITY)
Admission: RE | Admit: 2019-08-11 | Discharge: 2019-08-11 | Disposition: A | Discharge status: Home / Self Care | Payer: 59 | Source: Ambulatory Visit | Attending: Gastroenterology | Admitting: Gastroenterology

## 2019-08-11 DIAGNOSIS — K219 Gastro-esophageal reflux disease without esophagitis: Secondary | ICD-10-CM | POA: Insufficient documentation

## 2019-08-11 DIAGNOSIS — K449 Diaphragmatic hernia without obstruction or gangrene: Secondary | ICD-10-CM | POA: Insufficient documentation

## 2019-08-11 DIAGNOSIS — R1011 Right upper quadrant pain: Secondary | ICD-10-CM | POA: Insufficient documentation

## 2019-08-23 ENCOUNTER — Telehealth: Payer: Self-pay | Admitting: Gastroenterology

## 2019-08-23 NOTE — Telephone Encounter (Signed)
Patient is calling with additional questions

## 2019-08-24 NOTE — Telephone Encounter (Signed)
Questions about the findings on her recent imaging in relation to the aorta.  Very nice patient with understandable concerns about the findings. I have encouraged her to schedule an appointment with her PCP or to make a phone call to open this discussion.

## 2019-08-24 NOTE — Telephone Encounter (Signed)
Calling with a question on recent results

## 2019-09-22 ENCOUNTER — Telehealth: Payer: Self-pay | Admitting: Primary Care

## 2019-09-22 DIAGNOSIS — F32A Depression, unspecified: Secondary | ICD-10-CM

## 2019-09-22 DIAGNOSIS — F419 Anxiety disorder, unspecified: Secondary | ICD-10-CM

## 2019-09-22 NOTE — Telephone Encounter (Signed)
Last prescribed on 05/29/2019 Last OV (acute) with Abigail [name] on 07/28/2019 No future OV scheduled

## 2019-09-23 ENCOUNTER — Inpatient Hospital Stay (HOSPITAL_COMMUNITY): Admission: RE | Admit: 2019-09-23 | Payer: 59 | Source: Ambulatory Visit

## 2019-09-24 NOTE — Telephone Encounter (Signed)
Requested Prescriptions   Signed Prescriptions Disp Refills  . FLUoxetine (PROZAC) 40 MG capsule 90 capsule 2    Sig: TAKE 1 CAPSULE BY MOUTH ONCE DAILY for depression.    Authorizing Provider: [name]   Refill(s) sent to pharmacy.

## 2019-09-25 NOTE — Telephone Encounter (Signed)
Pt is requesting to reschedule her procedure at the hospital from Wednesday 7/21

## 2019-09-26 ENCOUNTER — Other Ambulatory Visit: Payer: Self-pay

## 2019-10-03 ENCOUNTER — Telehealth: Payer: Self-pay | Admitting: Primary Care

## 2019-10-03 MED ORDER — ACCU-CHEK GUIDE VI STRP: ORAL_STRIP | 5 refills | Status: DC

## 2019-10-03 MED ORDER — ACCU-CHEK FASTCLIX LANCETS MISC: 5 refills | Status: DC

## 2019-10-03 MED ORDER — ACCU-CHEK FASTCLIX LANCET KIT: PACK | 0 refills | Status: AC

## 2019-10-03 NOTE — Addendum Note (Signed)
Addended by: Jacqualin Combes on: 10/03/2019 12:41 PM   Modules accepted: Orders

## 2019-10-03 NOTE — Telephone Encounter (Signed)
Message left for patient that request have been completed.

## 2019-10-03 NOTE — Telephone Encounter (Signed)
Sent to pharmacy as requested 

## 2019-10-03 NOTE — Telephone Encounter (Signed)
Medication Refill - Medication:  Accu-Chek FastClix Lancets MISC   Has the patient contacted their pharmacy?  Yes advised to call office to get script.   Preferred Pharmacy (with phone number or street name):  [facility], [address] Phone:  [phone]  Fax:  [phone]

## 2019-10-03 NOTE — Telephone Encounter (Signed)
Noted. Refill sent and also send test strips since close to being time for a refill.

## 2019-10-03 NOTE — Telephone Encounter (Signed)
Patient called in stating she is needing the lancet holding device, also,  as her's is broken. Please advise.

## 2019-11-17 ENCOUNTER — Telehealth (INDEPENDENT_AMBULATORY_CARE_PROVIDER_SITE_OTHER): Payer: 59 | Admitting: Family Medicine

## 2019-11-17 ENCOUNTER — Encounter: Payer: Self-pay | Admitting: Family Medicine

## 2019-11-17 ENCOUNTER — Other Ambulatory Visit: Payer: Self-pay

## 2019-11-17 DIAGNOSIS — E039 Hypothyroidism, unspecified: Secondary | ICD-10-CM | POA: Diagnosis not present

## 2019-11-17 DIAGNOSIS — E559 Vitamin D deficiency, unspecified: Secondary | ICD-10-CM

## 2019-11-17 DIAGNOSIS — F32A Depression, unspecified: Secondary | ICD-10-CM

## 2019-11-17 DIAGNOSIS — E119 Type 2 diabetes mellitus without complications: Secondary | ICD-10-CM

## 2019-11-17 DIAGNOSIS — F419 Anxiety disorder, unspecified: Secondary | ICD-10-CM

## 2019-11-17 DIAGNOSIS — R519 Headache, unspecified: Secondary | ICD-10-CM

## 2019-11-17 DIAGNOSIS — R5383 Other fatigue: Secondary | ICD-10-CM | POA: Diagnosis not present

## 2019-11-17 DIAGNOSIS — F329 Major depressive disorder, single episode, unspecified: Secondary | ICD-10-CM

## 2019-11-17 NOTE — Progress Notes (Signed)
Virtual Visit via Video Note  I connected with Abigail [name] on 11/17/19 at  2:30 PM EDT by a video enabled telemedicine application and verified that I am speaking with the correct person using two identifiers.  Location: Patient: [name] Provider: [place] Persons participating in virtual visit: Patient, provider   I discussed the limitations of evaluation and management by telemedicine and the availability of in person appointments. The patient expressed understanding and agreed to proceed.  History of Present Illness: Chief Complaint  Patient presents with  . Headache    x1 month  . Fatigue    pt states have no energy and feels like body is shutting down, some nausea.Abigail [name] V/D   This is a 54 year old female who presents today for virtual visit with above concerns.  She has had daily headaches over her eyes and to the back of her head.  Sometimes she feels like her head is going to "explode."  She denies any light or sound sensitivity.  Occasionally she will have some stabbing pain in her eyes left greater than right.  She has had headaches similar in the past.  She has been taking occasional Advil sinus preparation.  With little relief.  She denies cough, fever, nasal drainage, loss of taste or smell, wheeze.  She has been fully vaccinated for COVID-19.  She also reports that she has been feeling fatigued for about 1 month.  She sleeps well at night and reports that she loves her job she just has not wanted to do things or get out of bed.  She denies any suicidal ideation.  She has lost two sons, the anniversaries of their deaths are in August and September.  She does report that she struggles around this time annually. Takes fluoxetine 40 mg daily.  She has a history of diabetes and checks her blood sugars periodically.  They have been ranging from the mid 90s to the 130s.     Observations/Objective: Patient is alert and answers questions appropriately.  Visible  skin is unremarkable.  Respirations are even and unlabored without increased work of breathing, no audible wheeze or witnessed cough.  Mood and affect are appropriate.  LMP 08/12/2015 Comment: patient states hx. tubal ligation Depression screen [facility] 2/9 11/17/2019 01/18/2019 09/17/2016 07/19/2014  Decreased Interest 3 1 3 3   Down, Depressed, Hopeless 3 0 2 3  PHQ - 2 Score 6 1 5 6   Altered sleeping 3 - 3 1  Tired, decreased energy 3 - 3 3  Change in appetite 0 - 0 1  Feeling bad or failure about yourself  1 - 0 3  Trouble concentrating 3 - 1 3  Moving slowly or fidgety/restless 3 - 0 2  Suicidal thoughts 0 - 0 0  PHQ-9 Score 19 - 12 19  Difficult doing work/chores Very difficult - - Somewhat difficult    Assessment and Plan: 1. Frequent headaches -Unclear etiology, it appears that she has had issues with headaches in the past.  No worrisome history.  Discussed over-the-counter analgesics, adequate fluid intake.  Advised her to schedule follow-up with PCP in 1 to 2 weeks - Comprehensive metabolic panel; Future  2. Other fatigue -We will check labs, also consider worsening of her depression especially around the anniversary of loss - TSH; Future - CBC with Differential; Future - Ferritin; Future - Vitamin B12; Future - Comprehensive metabolic panel; Future - Vitamin D, 25-hydroxy; Future  3. Hypothyroidism, unspecified type - TSH; Future - Comprehensive metabolic panel;  Future  4. Type 2 diabetes mellitus without complication, without long-term current use of insulin (HCC) - Comprehensive metabolic panel; Future - Hemoglobin A1c; Future  5. Anxiety and depression -May need adjustment in medication, will leave follow-up for PCP   Clarene Reamer, FNP-BC  [facility]  11/17/2019 5:53 PM   Follow Up Instructions:    I discussed the assessment and treatment plan with the patient. The patient was provided an opportunity to  ask questions and all were answered. The patient agreed with the plan and demonstrated an understanding of the instructions.   The patient was advised to call back or seek an in-person evaluation if the symptoms worsen or if the condition fails to improve as anticipated.    Elby Beck, FNP

## 2019-11-22 ENCOUNTER — Other Ambulatory Visit (INDEPENDENT_AMBULATORY_CARE_PROVIDER_SITE_OTHER): Payer: 59

## 2019-11-22 ENCOUNTER — Other Ambulatory Visit: Payer: Self-pay

## 2019-11-22 DIAGNOSIS — R519 Headache, unspecified: Secondary | ICD-10-CM

## 2019-11-22 DIAGNOSIS — E119 Type 2 diabetes mellitus without complications: Secondary | ICD-10-CM | POA: Diagnosis not present

## 2019-11-22 DIAGNOSIS — R5383 Other fatigue: Secondary | ICD-10-CM | POA: Diagnosis not present

## 2019-11-22 DIAGNOSIS — E039 Hypothyroidism, unspecified: Secondary | ICD-10-CM | POA: Diagnosis not present

## 2019-11-22 LAB — CBC WITH DIFFERENTIAL/PLATELET
Basophils Absolute: 0 10*3/uL (ref 0.0–0.1)
Basophils Relative: 0.6 % (ref 0.0–3.0)
Eosinophils Absolute: 0.4 10*3/uL (ref 0.0–0.7)
Eosinophils Relative: 5 % (ref 0.0–5.0)
HCT: 38.6 % (ref 36.0–46.0)
Hemoglobin: 12.6 g/dL (ref 12.0–15.0)
Lymphocytes Relative: 26 % (ref 12.0–46.0)
Lymphs Abs: 2 10*3/uL (ref 0.7–4.0)
MCHC: 32.7 g/dL (ref 30.0–36.0)
MCV: 85 fl (ref 78.0–100.0)
Monocytes Absolute: 0.4 10*3/uL (ref 0.1–1.0)
Monocytes Relative: 5.7 % (ref 3.0–12.0)
Neutro Abs: 4.9 10*3/uL (ref 1.4–7.7)
Neutrophils Relative %: 62.7 % (ref 43.0–77.0)
Platelets: 248 10*3/uL (ref 150.0–400.0)
RBC: 4.54 Mil/uL (ref 3.87–5.11)
RDW: 15 % (ref 11.5–15.5)
WBC: 7.7 10*3/uL (ref 4.0–10.5)

## 2019-11-22 LAB — VITAMIN D 25 HYDROXY (VIT D DEFICIENCY, FRACTURES): VITD: 17.72 ng/mL — ABNORMAL LOW (ref 30.00–100.00)

## 2019-11-22 LAB — COMPREHENSIVE METABOLIC PANEL
ALT: 54 U/L — ABNORMAL HIGH (ref 0–35)
AST: 43 U/L — ABNORMAL HIGH (ref 0–37)
Albumin: 4.1 g/dL (ref 3.5–5.2)
Alkaline Phosphatase: 106 U/L (ref 39–117)
BUN: 11 mg/dL (ref 6–23)
CO2: 27 mEq/L (ref 19–32)
Calcium: 9.1 mg/dL (ref 8.4–10.5)
Chloride: 105 mEq/L (ref 96–112)
Creatinine, Ser: 0.67 mg/dL (ref 0.40–1.20)
GFR: 91.43 mL/min (ref >60.00)
Glucose, Bld: 137 mg/dL — ABNORMAL HIGH (ref 70–99)
Potassium: 4.6 mEq/L (ref 3.5–5.1)
Sodium: 141 mEq/L (ref 135–145)
Total Bilirubin: 0.9 mg/dL (ref 0.2–1.2)
Total Protein: 6.7 g/dL (ref 6.0–8.3)

## 2019-11-22 LAB — TSH: TSH: 3.39 u[IU]/mL (ref 0.35–4.50)

## 2019-11-22 LAB — HEMOGLOBIN A1C: Hgb A1c MFr Bld: 7.1 % — ABNORMAL HIGH (ref 4.6–6.5)

## 2019-11-22 LAB — VITAMIN B12: Vitamin B-12: 227 pg/mL (ref 211–911)

## 2019-11-22 LAB — FERRITIN: Ferritin: 41.9 ng/mL (ref 10.0–291.0)

## 2019-11-23 MED ORDER — VITAMIN D3 1.25 MG (50000 UT) PO TABS: 1.0000 | ORAL_TABLET | ORAL | 1 refills | Status: DC

## 2019-11-23 NOTE — Addendum Note (Signed)
Addended by: Clarene Reamer B on: 11/23/2019 03:56 PM   Modules accepted: Orders

## 2019-12-02 ENCOUNTER — Other Ambulatory Visit (HOSPITAL_COMMUNITY): Admission: RE | Admit: 2019-12-02 | Payer: 59 | Source: Ambulatory Visit

## 2019-12-05 ENCOUNTER — Other Ambulatory Visit (HOSPITAL_COMMUNITY)
Admission: RE | Admit: 2019-12-05 | Discharge: 2019-12-05 | Disposition: A | Discharge status: Home / Self Care | Payer: 59 | Source: Ambulatory Visit | Attending: Gastroenterology | Admitting: Gastroenterology

## 2019-12-05 DIAGNOSIS — Z20822 Contact with and (suspected) exposure to covid-19: Secondary | ICD-10-CM | POA: Insufficient documentation

## 2019-12-05 DIAGNOSIS — Z01812 Encounter for preprocedural laboratory examination: Secondary | ICD-10-CM | POA: Insufficient documentation

## 2019-12-05 LAB — SARS CORONAVIRUS 2 (TAT 6-24 HRS): SARS Coronavirus 2: NEGATIVE

## 2019-12-06 ENCOUNTER — Ambulatory Visit (HOSPITAL_COMMUNITY)
Admission: RE | Admit: 2019-12-06 | Discharge: 2019-12-06 | Disposition: A | Discharge status: Home / Self Care | Payer: 59 | Attending: Gastroenterology | Admitting: Gastroenterology

## 2019-12-06 ENCOUNTER — Encounter (HOSPITAL_COMMUNITY)
Admission: RE | Disposition: A | Discharge status: Home / Self Care | Payer: Self-pay | Source: Home / Self Care | Attending: Gastroenterology

## 2019-12-06 DIAGNOSIS — K219 Gastro-esophageal reflux disease without esophagitis: Secondary | ICD-10-CM

## 2019-12-06 DIAGNOSIS — R1110 Vomiting, unspecified: Secondary | ICD-10-CM | POA: Diagnosis not present

## 2019-12-06 HISTORY — PX: ESOPHAGEAL MANOMETRY: SHX5429

## 2019-12-06 SURGERY — MANOMETRY, ESOPHAGUS

## 2019-12-06 MED ORDER — LIDOCAINE VISCOUS HCL 2 % MT SOLN
OROMUCOSAL | Status: AC
  Filled 2019-12-06: qty 15

## 2019-12-06 SURGICAL SUPPLY — 2 items
FACESHIELD LNG OPTICON STERILE (SAFETY) IMPLANT
GLOVE BIO SURGEON STRL SZ8 (GLOVE) ×4 IMPLANT

## 2019-12-06 NOTE — Progress Notes (Signed)
Esophageal manometry performed per protocol.  Patient tolerated well.  Report to be sent to Dr. Harl Bowie

## 2019-12-07 ENCOUNTER — Encounter (HOSPITAL_COMMUNITY): Payer: Self-pay | Admitting: Gastroenterology

## 2019-12-21 ENCOUNTER — Other Ambulatory Visit: Payer: Self-pay | Admitting: Primary Care

## 2019-12-21 DIAGNOSIS — E119 Type 2 diabetes mellitus without complications: Secondary | ICD-10-CM

## 2020-01-22 ENCOUNTER — Other Ambulatory Visit: Payer: Self-pay | Admitting: Cardiology

## 2020-02-27 ENCOUNTER — Telehealth: Payer: Self-pay | Admitting: Cardiology

## 2020-02-27 MED ORDER — ROSUVASTATIN CALCIUM 20 MG PO TABS
20.0000 mg | ORAL_TABLET | Freq: Every day | ORAL | 1 refills | Status: DC
Start: 2020-02-27 — End: 2020-08-29

## 2020-02-27 NOTE — Telephone Encounter (Signed)
*  STAT* If patient is at the pharmacy, call can be transferred to refill team.   1. Which medications need to be refilled? (please list name of each medication and dose if known)  Rosuvastatin  2. Which pharmacy/location (including street and city if local pharmacy) is medication to be sent to? [facility] [address], [place]  3. Do they need a 30 day or 90 day supply? enough until her appt

## 2020-02-29 ENCOUNTER — Ambulatory Visit: Payer: 59 | Admitting: Cardiology

## 2020-02-29 ENCOUNTER — Encounter: Payer: Self-pay | Admitting: Cardiology

## 2020-02-29 ENCOUNTER — Other Ambulatory Visit: Payer: Self-pay

## 2020-02-29 VITALS — BP 118/74 | HR 68 | Ht 62.0 in | Wt 187.0 lb

## 2020-02-29 DIAGNOSIS — E668 Other obesity: Secondary | ICD-10-CM

## 2020-02-29 DIAGNOSIS — R002 Palpitations: Secondary | ICD-10-CM | POA: Diagnosis not present

## 2020-02-29 DIAGNOSIS — I493 Ventricular premature depolarization: Secondary | ICD-10-CM

## 2020-02-29 DIAGNOSIS — E7849 Other hyperlipidemia: Secondary | ICD-10-CM | POA: Diagnosis not present

## 2020-02-29 DIAGNOSIS — E119 Type 2 diabetes mellitus without complications: Secondary | ICD-10-CM

## 2020-02-29 NOTE — Patient Instructions (Addendum)
Medication Instructions:   Not needed  *If you need a refill on your cardiac medications before your next appointment, please call your pharmacy*   Lab Work:  lipid Hepatic hgbaic If you have labs (blood work) drawn today and your tests are completely normal, you will receive your results only by:  [place] (if you have MyChart) OR  A paper copy in the mail If you have any lab test that is abnormal or we need to change your treatment, we will call you to review the results.   Testing/Procedures: Not needed   Follow-Up: At [facility], you and your health needs are our priority.  As part of our continuing mission to provide you with exceptional heart care, we have created designated Provider Care Teams.  These Care Teams include your primary Cardiologist (physician) and Advanced Practice Providers (APPs -  Physician Assistants and Nurse Practitioners) who all work together to provide you with the care you need, when you need it.     Your next appointment:   6 month(s)  The format for your next appointment:   In Person  Provider:   Glenetta Hew, MD

## 2020-02-29 NOTE — Progress Notes (Signed)
Primary Care Provider: [name], NP Cardiologist: Glenetta Hew, MD Electrophysiologist: None  Clinic Note: Chief Complaint  Patient presents with  . Follow-up    Doing great.  Significant weight loss.  Less palpitations  . Palpitations    Improved   Problem List Items Addressed This Visit    Moderate obesity (Chronic)   Relevant Orders   Hepatic function panel   Lipid panel   Hemoglobin A1c   Palpitations (Chronic)   Relevant Orders   EKG 12-Lead (Completed)   PVC's (premature ventricular contractions) - Primary (Chronic)   Relevant Orders   EKG 12-Lead (Completed)   Hyperlipidemia due to dietary fat intake (Chronic)   Relevant Orders   Hepatic function panel   Lipid panel   Hemoglobin A1c    HPI:    Abigail [name] is a obese 54 y.o. female with a history of PACs and PVCs as well as hyperlipidemia below who presents today for annual follow-up.  [name] was last seen on February 13, 2019 --> had recovered from a bout of bronchitis.  Was have still having sharp chest pains off and on.  Usually worse when lying down and deep breaths.  Not necessarily associate with activity.  Occasionally feeling heart rates going up, but not that frequent. ->  No changes made.  Recent Hospitalizations:   12/06/2019-esophageal manometry  Reviewed  CV studies:    The following studies were reviewed today: (if available, images/films reviewed: From Epic Chart or Care Everywhere) . None:  Interval History:   Abigail [name] returns here today overall really doing well.  She has been making a concerted effort to adjust her diet, and has increased her exercise.  She has lost 16 pounds over the last year or so.  Her diet is notably improved and this is improved her energy level.  The more active she is, she describes less palpitations.  When she is sedentary, she notes more off-and-on palpitations.  Fatigue is notably reduced.  She is not having the frequent  sharp chest discomfort off and on.  Relatively rare.  CV Review of Symptoms (Summary): no chest pain or dyspnea on exertion positive for - Rare palpitations and even more rare sharp chest pains.  Significant weight loss. negative for - edema, orthopnea, paroxysmal nocturnal dyspnea, rapid heart rate, shortness of breath or Syncope/near syncope or TIA/amaurosis fugax, claudication  The patient does not have symptoms concerning for COVID-19 infection (fever, chills, cough, or new shortness of breath).   REVIEWED OF SYSTEMS   Review of Systems  Constitutional: Positive for weight loss (Intentional). Negative for malaise/fatigue (Energy level definitely better with more exercise and weight loss.).  HENT: Negative for congestion and nosebleeds.   Respiratory: Negative for cough, shortness of breath (Notably less short of breath with exertion.) and wheezing.   Gastrointestinal: Negative for blood in stool and melena.  Genitourinary: Negative for hematuria.  Musculoskeletal: Positive for joint pain and myalgias. Negative for falls.  Neurological: Positive for dizziness (Still has not vertigo symptoms). Negative for headaches.  Psychiatric/Behavioral: Negative for memory loss. The patient is not nervous/anxious (Much less anxious) and does not have insomnia.        Anxiety and insomnia much improved.   I have reviewed and (if needed) personally updated the patient's problem list, medications, allergies, past medical and surgical history, social and family history.   PAST MEDICAL HISTORY   Past Medical History:  Diagnosis Date  . Anxiety   . Bulging lumbar  disc   . CAP (community acquired pneumonia)   . Cardiac arrhythmia due to congenital heart disease    no arrhythmia identified   . Celiac disease   . Depression   . Diverticulosis    Sigmoid colon  . Fatty liver 05/2016  . Frequent headaches   . GERD (gastroesophageal reflux disease)   . Heart disease   . History of kidney stones    . Hypertension   . Kidney stones   . Migraine   . Obesity (BMI 35.0-39.9 without comorbidity)   . UTI (urinary tract infection)     PAST SURGICAL HISTORY   Past Surgical History:  Procedure Laterality Date  . ABDOMINAL EXPLORATION SURGERY  1986  . COLONOSCOPY  06/2016  . ESOPHAGEAL MANOMETRY N/A 12/06/2019   Procedure: ESOPHAGEAL MANOMETRY (EM);  Surgeon: Mauri Pole, MD;  Location: [facility];  Service: Endoscopy;  Laterality: N/A;  . EXTRACORPOREAL SHOCK WAVE LITHOTRIPSY Left 12/03/2016   Procedure: LEFT EXTRACORPOREAL SHOCK WAVE LITHOTRIPSY (ESWL);  Surgeon: Irine Seal, MD;  Location: [facility];  Service: Urology;  Laterality: Left;  . KIDNEY STONE SURGERY    . LEFT HEART CATH AND CORONARY ANGIOGRAPHY  07/28/2016    Large tortuous coronary arteries no radiographic evidence of disease.  Not anginal chest pain.  Normal EF.  [name] LITHOTRIPSY    . NASAL SINUS SURGERY    . ROTATOR CUFF REPAIR Right 12/27/2017  . TRANSTHORACIC ECHOCARDIOGRAM  06/2017   EF 60-65%.  GR 1-2 DD.  Otherwise normal echo.  Normal valve function.  Normal wall motion.  . TUBAL LIGATION      Immunization History  Administered Date(s) Administered  . Influenza-Unspecified 12/10/2014  . PFIZER SARS-COV-2 Vaccination 07/06/2019, 07/27/2019    MEDICATIONS/ALLERGIES   Current Meds  Medication Sig  . Accu-Chek FastClix Lancets MISC USE 1 LANCET TO CHECK BLOOD SUGAR THREE TIMES DAILY  . albuterol (PROAIR HFA) 108 (90 Base) MCG/ACT inhaler Inhale 2 puffs into the lungs every 6 (six) hours as needed for wheezing or shortness of breath.  . benzonatate (TESSALON) 200 MG capsule Take 200 mg by mouth 3 (three) times daily as needed.  . Blood Glucose Monitoring Suppl (ACCU-CHEK GUIDE) w/Device KIT by Does not apply route.  . Cholecalciferol (VITAMIN D3) 1.25 MG (50000 UT) TABS Take 1 tablet by mouth every 7 (seven) days.  [name] esomeprazole (NEXIUM) 40 MG capsule TAKE 1 CAPSULE BY MOUTH DAILY  . FLUoxetine (PROZAC)  40 MG capsule TAKE 1 CAPSULE BY MOUTH ONCE DAILY for depression.  [name] glucose blood (ACCU-CHEK GUIDE) test strip USE 1 STRIP   THREE TIMES DAILY  . HYDROcodone-acetaminophen (NORCO/VICODIN) 5-325 MG tablet Take 1 tablet by mouth every 6 (six) hours as needed. for pain  . hydrOXYzine (ATARAX/VISTARIL) 10 MG tablet Take 1 to 2 tablets by mouth twice daily as needed for anxiety.  . Lancets Misc. (ACCU-CHEK FASTCLIX LANCET) KIT Use as directed to test blood sugar daily  . linaclotide (LINZESS) 145 MCG CAPS capsule Take 1 capsule (145 mcg total) by mouth daily before breakfast.  . metFORMIN (GLUCOPHAGE) 500 MG tablet TAKE 1 TABLET BY MOUTH TWICE DAILY WITH FOOD FOR DIABETES  . methocarbamol (ROBAXIN) 500 MG tablet Take 500 mg by mouth 3 (three) times daily.  . metoprolol tartrate (LOPRESSOR) 25 MG tablet Take 1 tablet by mouth twice daily  . montelukast (SINGULAIR) 10 MG tablet Take 1 tablet (10 mg total) by mouth at bedtime. For allergies.  [name] ondansetron (ZOFRAN ODT) 8 MG disintegrating tablet  Take 1 tablet (8 mg total) by mouth every 8 (eight) hours as needed for nausea or vomiting.  [name] oxybutynin (DITROPAN-XL) 5 MG 24 hr tablet Take 5 mg by mouth daily.  [name] Peppermint Oil (IBGARD PO) Take by mouth.  . rosuvastatin (CRESTOR) 20 MG tablet Take 1 tablet (20 mg total) by mouth daily. Need appointment  . topiramate (TOPAMAX) 50 MG tablet Take 1 tablet (50 mg total) by mouth at bedtime. For headache prevention.  . traMADol (ULTRAM) 50 MG tablet Take 50 mg by mouth every morning.  . [DISCONTINUED] levothyroxine (SYNTHROID) 75 MCG tablet TAKE ONE TABLET BY MOUTH ONCE DAILY IN THE MORNING ON AN EMPTY STOMACH WITH WATER. NO FOOD OR OTHER MEDICATIONS FOR 30 MINUTES    Allergies  Allergen Reactions  . Penicillins Nausea Only    Has patient had a PCN reaction causing immediate rash, facial/tongue/throat swelling, SOB or lightheadedness with hypotension: No Has patient had a PCN reaction causing severe rash  involving mucus membranes or skin necrosis: No Has patient had a PCN reaction that required hospitalization: No Has patient had a PCN reaction occurring within the last 10 years: No If all of the above answers are "NO", then may proceed with Cephalosporin use.   . Latex Rash    SOCIAL HISTORY/FAMILY HISTORY   Reviewed in Epic:  Pertinent findings:  Social History   Tobacco Use  . Smoking status: Former Smoker    Types: Cigarettes    Quit date: 2008    Years since quitting: 14.0  . Smokeless tobacco: Never Used  . Tobacco comment: used tobacco "socially"  Vaping Use  . Vaping Use: Never used  Substance Use Topics  . Alcohol use: No    Alcohol/week: 0.0 standard drinks  . Drug use: No   Social History   Social History Narrative   Single.   Has a set of Twins, age 53.   Works for the [employer] and [employer] and [employer]   Enjoys relaxing, spending time with her children.    OBJCTIVE -PE, EKG, labs   Wt Readings from Last 3 Encounters:  02/29/20 187 lb (84.8 kg)  08/04/19 198 lb (89.8 kg)  07/28/19 200 lb 8 oz (90.9 kg)    Physical Exam: BP 118/74 (BP Location: Left Arm, Patient Position: Sitting, Cuff Size: Normal)   Pulse 68   Ht 5' 2" (1.575 m)   Wt 187 lb (84.8 kg)   LMP 08/12/2015 Comment: patient states hx. tubal ligation  BMI 34.20 kg/m  Physical Exam Vitals reviewed.  Constitutional:      General: She is not in acute distress.    Appearance: She is obese. She is not ill-appearing or toxic-appearing.     Comments: Notable weight loss.  Healthy-appearing.  Well-groomed.  HENT:     Head: Normocephalic and atraumatic.  Neck:     Vascular: No carotid bruit, hepatojugular reflux or JVD.  Cardiovascular:     Rate and Rhythm: Normal rate and regular rhythm. Occasional extrasystoles are present.    Chest Wall: PMI is not displaced.     Pulses: Intact distal pulses.     Heart sounds: S1 normal and S2 normal. Heart sounds are distant. No murmur  heard. No friction rub. No gallop.   Pulmonary:     Effort: Pulmonary effort is normal. No respiratory distress.     Breath sounds: Normal breath sounds.  Chest:     Chest wall: Tenderness (Still has left sided sternal tenderness) present.  Musculoskeletal:        General: Swelling (Trivial) present. Normal range of motion.     Cervical back: Normal range of motion and neck supple.  Skin:    General: Skin is warm and dry.  Neurological:     General: No focal deficit present.     Mental Status: She is alert and oriented to person, place, and time. Mental status is at baseline.  Psychiatric:        Mood and Affect: Mood normal.        Behavior: Behavior normal.        Thought Content: Thought content normal.        Judgment: Judgment normal.      Adult ECG Report  Rate: 68 ;  Rhythm: normal sinus rhythm and CRO AMI, age indeterminate.  Otherwise normal axis, normal durations.;   Narrative Interpretation: Stable EKG.  Recent Labs: Should be due to have labs checked soon.  No new labs since 2020 currently available Lab Results  Component Value Date   CHOL 148 11/30/2018   HDL 26.20 (L) 11/30/2018   LDLCALC 89 11/30/2018   TRIG 165.0 (H) 11/30/2018   CHOLHDL 6 11/30/2018   Lab Results  Component Value Date   CREATININE 0.67 11/22/2019   BUN 11 11/22/2019   NA 141 11/22/2019   K 4.6 11/22/2019   CL 105 11/22/2019   CO2 27 11/22/2019   Lab Results  Component Value Date   TSH 3.39 11/22/2019    ASSESSMENT/PLAN    Problem List Items Addressed This Visit    Moderate obesity (Chronic)    She is doing great with weight loss.  This is the first time she is actually having decent weight loss and they come of number.  Continue to encourage her activities.  Hope this will help her lipids and blood pressure.      Relevant Orders   Hepatic function panel   Lipid panel   Hemoglobin A1c   Palpitations (Chronic)    Stable, controlled.  Continue current dose of  beta-blocker.  No further titration.  Okay to use PRN dosing if necessary.      Relevant Orders   EKG 12-Lead (Completed)   Hepatic function panel   Lipid panel   Hemoglobin A1c   PVC's (premature ventricular contractions) - Primary (Chronic)    Stable.  Well-controlled on current dose of Lopressor.  Not requiring any as needed dosing of additional tablets.      Relevant Orders   EKG 12-Lead (Completed)   Hepatic function panel   Lipid panel   Hemoglobin A1c   Hyperlipidemia due to dietary fat intake (Chronic)    Labs not been checked since September 2020.  They were at goal plan on 20 g rosuvastatin.  Will order lipid panel, as well as LFTs/CMP and A1c.  Adjust therapy based on results.      Relevant Orders   Hepatic function panel   Lipid panel   Hemoglobin A1c   Type 2 diabetes mellitus without complication, without long-term current use of insulin (HCC) (Chronic)    Check A1c. Only on Metformin.          COVID-19 Education: The signs and symptoms of COVID-19 were discussed with the patient and how to seek care for testing (follow up with PCP or arrange E-visit).   The importance of social distancing and COVID-19 vaccination was discussed today.  The patient is practicing social distancing & Masking.   I spent a total  of 32 minutes with the patient spent in direct patient consultation.  She was very excited explaining her diet changes and exercise.  Additional time spent with chart review  / charting (studies, outside notes, etc): 12 Total Time: 41mn   Current medicines are reviewed at length with the patient today.  (+/- concerns) n/a  This visit occurred during the SARS-CoV-2 public health emergency.  Safety protocols were in place, including screening questions prior to the visit, additional usage of staff PPE, and extensive cleaning of exam room while observing appropriate contact time as indicated for disinfecting solutions.  Notice: This dictation was  prepared with Dragon dictation along with smaller phrase technology. Any transcriptional errors that result from this process are unintentional and may not be corrected upon review.  Patient Instructions / Medication Changes & Studies & Tests Ordered   Patient Instructions  Medication Instructions:   Not needed  *If you need a refill on your cardiac medications before your next appointment, please call your pharmacy*   Lab Work:  lipid Hepatic hgbaic If you have labs (blood work) drawn today and your tests are completely normal, you will receive your results only by: .Marland KitchenMyChart Message (if you have MyChart) OR . A paper copy in the mail If you have any lab test that is abnormal or we need to change your treatment, we will call you to review the results.   Testing/Procedures: Not needed   Follow-Up: [facility] needs are our [facility].  As part of our continuing mission to provide you with exceptional heart care, we have created designated Provider Care Teams.  These Care Teams include your primary Cardiologist (physician) and Advanced Practice Providers (APPs -  Physician Assistants and Nurse Practitioners) who all work together to provide you with the care you need, when you need it.     Your next appointment:   6 month(s)  The format for your next appointment:   In Person  Provider:   DGlenetta Hew MD        Studies Ordered:   Orders Placed This Encounter  Procedures  . Hepatic function panel  . Lipid panel  . Hemoglobin A1c  . EKG 12-Lead     DGlenetta Hew M.D., M.S. Interventional Cardiologist   Pager # [phone] # [phone] [place] [id number]  [facility]!

## 2020-03-04 ENCOUNTER — Other Ambulatory Visit: Payer: Self-pay | Admitting: Primary Care

## 2020-03-04 DIAGNOSIS — E034 Atrophy of thyroid (acquired): Secondary | ICD-10-CM

## 2020-03-05 NOTE — Telephone Encounter (Signed)
Please Advise on this. It's not on the current medication list

## 2020-03-11 ENCOUNTER — Encounter: Payer: Self-pay | Admitting: Cardiology

## 2020-03-11 NOTE — Assessment & Plan Note (Signed)
Check A1c. Only on Metformin.

## 2020-03-11 NOTE — Assessment & Plan Note (Signed)
She is doing great with weight loss.  This is the first time she is actually having decent weight loss and they come of number.  Continue to encourage her activities.  Hope this will help her lipids and blood pressure.

## 2020-03-11 NOTE — Assessment & Plan Note (Signed)
Stable.  Well-controlled on current dose of Lopressor.  Not requiring any as needed dosing of additional tablets.

## 2020-03-11 NOTE — Assessment & Plan Note (Addendum)
Labs not been checked since September 2020.  They were at goal plan on 20 g rosuvastatin.  Will order lipid panel, as well as LFTs/CMP and A1c.  Adjust therapy based on results.

## 2020-03-11 NOTE — Assessment & Plan Note (Signed)
Stable, controlled.  Continue current dose of beta-blocker.  No further titration.  Okay to use PRN dosing if necessary.

## 2020-03-16 ENCOUNTER — Other Ambulatory Visit: Payer: Self-pay | Admitting: Cardiology

## 2020-03-19 ENCOUNTER — Other Ambulatory Visit: Payer: 59

## 2020-03-19 ENCOUNTER — Other Ambulatory Visit: Payer: Self-pay

## 2020-03-19 ENCOUNTER — Telehealth (INDEPENDENT_AMBULATORY_CARE_PROVIDER_SITE_OTHER): Payer: 59 | Admitting: Primary Care

## 2020-03-19 ENCOUNTER — Encounter: Payer: Self-pay | Admitting: Primary Care

## 2020-03-19 ENCOUNTER — Telehealth: Payer: Self-pay | Admitting: *Deleted

## 2020-03-19 VITALS — Ht 62.0 in | Wt 183.0 lb

## 2020-03-19 DIAGNOSIS — R059 Cough, unspecified: Secondary | ICD-10-CM | POA: Diagnosis not present

## 2020-03-19 MED ORDER — BENZONATATE 200 MG PO CAPS: 200.0000 mg | ORAL_CAPSULE | Freq: Three times a day (TID) | ORAL | 0 refills | Status: DC | PRN

## 2020-03-19 MED ORDER — HYDROCODONE-HOMATROPINE 5-1.5 MG/5ML PO SYRP: 5.0000 mL | ORAL_SOLUTION | Freq: Every evening | ORAL | 0 refills | Status: DC | PRN

## 2020-03-19 MED ORDER — PROMETHAZINE-DM 6.25-15 MG/5ML PO SYRP: 5.0000 mL | ORAL_SOLUTION | Freq: Two times a day (BID) | ORAL | 0 refills | Status: DC | PRN

## 2020-03-19 NOTE — Telephone Encounter (Signed)
Called and spoke with Stanton Kidney at [facility], we agreed to discontinue her Hycodan.  I sent in a new prescription for promethazine-dextromethorphan to use as needed for cough.  Stanton Kidney will update patient once patient arrives.

## 2020-03-19 NOTE — Progress Notes (Signed)
Subjective:    Patient ID: Abigail [name], female    DOB: October 19, 1965, 55 y.o.   MRN: [id number]  HPI  Virtual Visit via Video Note  I connected with Abigail [name] on 03/19/20 at  2:20 PM EST by a video enabled telemedicine application and verified that I am speaking with the correct person using two identifiers.  Location: Patient: Home Provider: Office Participants: Patient and myself   I discussed the limitations of evaluation and management by telemedicine and the availability of in person appointments. The patient expressed understanding and agreed to proceed.  History of Present Illness:  Abigail [name] is a 55 year old female with a history of type 2 diabetes, CAP, hypothyroidism, sleep apnea, obesity who presents today with a chief complaint of cough.  Symptoms began four days ago. Cough is congested with thick yellow/green mucous, also with some rhinorrhea. She denies diarrhea, loss of taste/smell, shortness of breath, fevers.   She has been vaccinated against Covid-19, 2 vaccines. She has been exposed to several people who have ended up testing positive for Covid-19.   She's currently managed on prednisone for which she's been taking for the last 5 days for left sided sciatica per orthopedics. She's also taking Nasacort, Advil Allergy, Tessalon Perles.   She has missed yesterday and today from work, is needing a note.   Observations/Objective:  Alert and oriented.  Raspy voice. No distress. Speaking in complete sentences. No cough during visit  Assessment and Plan:  Four days of viral symptoms, numerous exposures to Covid-19 virus. Fortunately she's received 2 Covid-19 vaccines.  Today she appears mildly ill, but overall looks good.  Discussed CDC guidelines for isolation. We will orchestrate covid-19 testing. She is requesting Tessalon Perles and Hycodan to use HS.  She is out of her oral Hydrocodone, discussed that she cannot take the syrup and  pills together.  Rx's sent to pharmacy.  Return precautions provided.  Follow Up Instructions:  We will call you regarding Covid-19 testing.  Do NOT take your hydrocodone pills with the Hycodan cough syrup. Use the syrup at bedtime if needed.  You may take Benzonatate capsules for cough. Take 1 capsule by mouth three times daily as needed for cough.  It was a pleasure to see you today! Abigail Bossier, NP-C    I discussed the assessment and treatment plan with the patient. The patient was provided an opportunity to ask questions and all were answered. The patient agreed with the plan and demonstrated an understanding of the instructions.   The patient was advised to call back or seek an in-person evaluation if the symptoms worsen or if the condition fails to improve as anticipated.    [name], NP    Review of Systems  Constitutional: Negative for chills, fatigue and fever.  HENT: Positive for congestion and postnasal drip.   Respiratory: Positive for cough.   Gastrointestinal: Negative for diarrhea and nausea.  Neurological: Negative for headaches.       Past Medical History:  Diagnosis Date  . Anxiety   . Bulging lumbar disc   . CAP (community acquired pneumonia)   . Cardiac arrhythmia due to congenital heart disease    no arrhythmia identified   . Celiac disease   . Depression   . Diverticulosis    Sigmoid colon  . Fatty liver 05/2016  . Frequent headaches   . GERD (gastroesophageal reflux disease)   . Heart disease   . History of kidney stones   .  Hypertension   . Kidney stones   . Migraine   . Obesity (BMI 35.0-39.9 without comorbidity)   . UTI (urinary tract infection)      Social History   Socioeconomic History  . Marital status: Single    Spouse name: Not on file  . Number of children: Not on file  . Years of education: Not on file  . Highest education level: Not on file  Occupational History  . Not on file  Tobacco Use  . Smoking  status: Former Smoker    Types: Cigarettes    Quit date: 2008    Years since quitting: 14.0  . Smokeless tobacco: Never Used  . Tobacco comment: used tobacco "socially"  Vaping Use  . Vaping Use: Never used  Substance and Sexual Activity  . Alcohol use: No    Alcohol/week: 0.0 standard drinks  . Drug use: No  . Sexual activity: Not on file  Other Topics Concern  . Not on file  Social History Narrative   Single.   Has a set of Twins, age 46.   Works for the [employer] and [employer] and [employer]   Enjoys relaxing, spending time with her children.   Social Determinants of Health   Financial Resource Strain: Not on file  Food Insecurity: Not on file  Transportation Needs: Not on file  Physical Activity: Not on file  Stress: Not on file  Social Connections: Not on file  Intimate Partner Violence: Not on file    Past Surgical History:  Procedure Laterality Date  . ABDOMINAL EXPLORATION SURGERY  1986  . COLONOSCOPY  06/2016  . ESOPHAGEAL MANOMETRY N/A 12/06/2019   Procedure: ESOPHAGEAL MANOMETRY (EM);  Surgeon: Mauri Pole, MD;  Location: [facility];  Service: Endoscopy;  Laterality: N/A;  . EXTRACORPOREAL SHOCK WAVE LITHOTRIPSY Left 12/03/2016   Procedure: LEFT EXTRACORPOREAL SHOCK WAVE LITHOTRIPSY (ESWL);  Surgeon: Irine Seal, MD;  Location: [facility];  Service: Urology;  Laterality: Left;  . KIDNEY STONE SURGERY    . LEFT HEART CATH AND CORONARY ANGIOGRAPHY  07/28/2016    Large tortuous coronary arteries no radiographic evidence of disease.  Not anginal chest pain.  Normal EF.  [name] LITHOTRIPSY    . NASAL SINUS SURGERY    . ROTATOR CUFF REPAIR Right 12/27/2017  . TRANSTHORACIC ECHOCARDIOGRAM  06/2017   EF 60-65%.  GR 1-2 DD.  Otherwise normal echo.  Normal valve function.  Normal wall motion.  . TUBAL LIGATION      Family History  Problem Relation Age of Onset  . COPD Father   . Emphysema Father   . Gallstones Mother   . Colon cancer Neg Hx   . Stomach  cancer Neg Hx   . Esophageal cancer Neg Hx     Allergies  Allergen Reactions  . Penicillins Nausea Only    Has patient had a PCN reaction causing immediate rash, facial/tongue/throat swelling, SOB or lightheadedness with hypotension: No Has patient had a PCN reaction causing severe rash involving mucus membranes or skin necrosis: No Has patient had a PCN reaction that required hospitalization: No Has patient had a PCN reaction occurring within the last 10 years: No If all of the above answers are "NO", then may proceed with Cephalosporin use.   . Latex Rash    Current Outpatient Medications on File Prior to Visit  Medication Sig Dispense Refill  . Accu-Chek FastClix Lancets MISC USE 1 LANCET TO CHECK BLOOD SUGAR THREE TIMES DAILY 102 each 5  .  albuterol (PROAIR HFA) 108 (90 Base) MCG/ACT inhaler Inhale 2 puffs into the lungs every 6 (six) hours as needed for wheezing or shortness of breath. 8 g 0  . Blood Glucose Monitoring Suppl (ACCU-CHEK GUIDE) w/Device KIT by Does not apply route.    . Cholecalciferol (VITAMIN D3) 1.25 MG (50000 UT) TABS Take 1 tablet by mouth every 7 (seven) days. 12 tablet 1  . esomeprazole (NEXIUM) 40 MG capsule TAKE 1 CAPSULE BY MOUTH DAILY 90 capsule 1  . EUTHYROX 75 MCG tablet TAKE 1 TABLET BY MOUTH ONCE DAILY IN THE MORNING ON AN EMPTY STOMACH WITH WATER & NO FOOD OR OTHER MEDICATIONS FOR 30 MINUTES 90 tablet 0  . FLUoxetine (PROZAC) 40 MG capsule TAKE 1 CAPSULE BY MOUTH ONCE DAILY for depression. 90 capsule 2  . glucose blood (ACCU-CHEK GUIDE) test strip USE 1 STRIP   THREE TIMES DAILY 100 each 5  . HYDROcodone-acetaminophen (NORCO/VICODIN) 5-325 MG tablet Take 1 tablet by mouth every 6 (six) hours as needed. for pain    . hydrOXYzine (ATARAX/VISTARIL) 10 MG tablet Take 1 to 2 tablets by mouth twice daily as needed for anxiety. 30 tablet 0  . Lancets Misc. (ACCU-CHEK FASTCLIX LANCET) KIT Use as directed to test blood sugar daily 1 kit 0  . linaclotide  (LINZESS) 145 MCG CAPS capsule Take 1 capsule (145 mcg total) by mouth daily before breakfast. 90 capsule 3  . metFORMIN (GLUCOPHAGE) 500 MG tablet TAKE 1 TABLET BY MOUTH TWICE DAILY WITH FOOD FOR DIABETES 180 tablet 0  . methocarbamol (ROBAXIN) 500 MG tablet Take 500 mg by mouth 3 (three) times daily.    . metoprolol tartrate (LOPRESSOR) 25 MG tablet Take 1 tablet by mouth twice daily 60 tablet 0  . montelukast (SINGULAIR) 10 MG tablet Take 1 tablet (10 mg total) by mouth at bedtime. For allergies. 30 tablet 0  . ondansetron (ZOFRAN ODT) 8 MG disintegrating tablet Take 1 tablet (8 mg total) by mouth every 8 (eight) hours as needed for nausea or vomiting. 20 tablet 1  . oxybutynin (DITROPAN-XL) 5 MG 24 hr tablet Take 5 mg by mouth daily.  1  . Peppermint Oil (IBGARD PO) Take by mouth.    . rosuvastatin (CRESTOR) 20 MG tablet Take 1 tablet (20 mg total) by mouth daily. Need appointment 90 tablet 1  . topiramate (TOPAMAX) 50 MG tablet Take 1 tablet (50 mg total) by mouth at bedtime. For headache prevention. 30 tablet 0  . traMADol (ULTRAM) 50 MG tablet Take 50 mg by mouth every morning.     No current facility-administered medications on file prior to visit.    Ht _0  (1.575 m)   Wt 183 lb (83 kg)   LMP 08/12/2015 Comment: patient states hx. tubal ligation  BMI 33.47 kg/m    Objective:   Physical Exam Constitutional:      General: She is not in acute distress. HENT:     Head:     Comments: Hoarse voice during visit Pulmonary:     Effort: Pulmonary effort is normal.     Comments: No cough during visit Neurological:     Mental Status: She is alert.            Assessment & Plan:

## 2020-03-19 NOTE — Assessment & Plan Note (Signed)
Four days of viral symptoms, numerous exposures to Covid-19 virus. Fortunately she's received 2 Covid-19 vaccines.  Today she appears mildly ill, but overall looks good.  Discussed CDC guidelines for isolation. We will orchestrate covid-19 testing. She is requesting Tessalon Perles and Hycodan to use HS.  She is out of her oral Hydrocodone, discussed that she cannot take the syrup and pills together.  Rx's sent to pharmacy.  Return precautions provided.

## 2020-03-19 NOTE — Patient Instructions (Signed)
We will call you regarding Covid-19 testing.  Do NOT take your hydrocodone pills with the Hycodan cough syrup. Use the syrup at bedtime if needed.  You may take Benzonatate capsules for cough. Take 1 capsule by mouth three times daily as needed for cough.  It was a pleasure to see you today! [name], NP-C

## 2020-03-19 NOTE — Telephone Encounter (Signed)
Mary pharmacist at [facility] called stating that they received a script for Hycodan. Abigail [name] stated that patient is taking Hydrocodone 5-325 daily. Abigail [name] stated that she wanted to make sure that Abigail Bossier NP knew about the Hydrocodone, Abigail [name] stated that she does not know that patient will benefit from the [facility] since she takes the Hydrocodone on a regular basis. Mary requested a call back to confirm that patient should get the Hycodan.

## 2020-03-22 LAB — NOVEL CORONAVIRUS, NAA: SARS-CoV-2, NAA: NOT DETECTED

## 2020-04-01 ENCOUNTER — Telehealth: Payer: Self-pay

## 2020-04-01 NOTE — Telephone Encounter (Signed)
Patient states that if she does get an antibiotic sent in, she asked if a prescription could be sent for a yeast infection. Abigail [name] states she gets a yeast infection when on antibiotics.

## 2020-04-01 NOTE — Telephone Encounter (Signed)
Patient is calling in asking if Anda Kraft could send in a prescription for her cough, she was seen last week and feels like her symptoms are not getting any better.

## 2020-04-02 NOTE — Telephone Encounter (Signed)
Called patient and scheduled for virtual visit with Dr.Copland as PCP unavailable.

## 2020-04-02 NOTE — Telephone Encounter (Signed)
Can you call to set up for virtual with someone?

## 2020-04-03 ENCOUNTER — Encounter: Payer: Self-pay | Admitting: Family Medicine

## 2020-04-03 ENCOUNTER — Telehealth (INDEPENDENT_AMBULATORY_CARE_PROVIDER_SITE_OTHER): Payer: 59 | Admitting: Family Medicine

## 2020-04-03 VITALS — Ht 62.0 in | Wt 181.0 lb

## 2020-04-03 DIAGNOSIS — J208 Acute bronchitis due to other specified organisms: Secondary | ICD-10-CM

## 2020-04-03 MED ORDER — FLUCONAZOLE 150 MG PO TABS: ORAL_TABLET | ORAL | 0 refills | Status: DC

## 2020-04-03 MED ORDER — AMOXICILLIN 875 MG PO TABS: 875.0000 mg | ORAL_TABLET | Freq: Two times a day (BID) | ORAL | 0 refills | Status: AC

## 2020-04-03 MED ORDER — ALBUTEROL SULFATE HFA 108 (90 BASE) MCG/ACT IN AERS: 2.0000 | INHALATION_SPRAY | Freq: Four times a day (QID) | RESPIRATORY_TRACT | 0 refills | Status: DC | PRN

## 2020-04-03 NOTE — Progress Notes (Signed)
Karl Erway T. Senai Kingsley, MD Primary Care and Sports Medicine [facility] at [facility] [place], [address] Phone: [phone]  FAX: [place] - 55 y.o. female  MRN [id number]  Date of Birth: 10-Jan-1966  Visit Date: 04/03/2020  PCP: [name], NP  Referred by: [name], NP  Virtual Visit via Video Note:  I connected with  Abigail [name] on 04/03/2020 11:00 AM EST by a video enabled telemedicine application and verified that I am speaking with the correct person using two identifiers.   Location patient: home computer, tablet, or smartphone Location provider: work or home office Consent: Verbal consent directly obtained from [employer]. Persons participating in the virtual visit: patient, provider  I discussed the limitations of evaluation and management by telemedicine and the availability of in person appointments. The patient expressed understanding and agreed to proceed.  Chief Complaint  Patient presents with  . Cough    Since 03/15/2020- Negative Covid Test  . Nasal Congestion    With green mucus    History of Present Illness:  Has been feeling bad for about two weeks.  No fever, and she tested neg for covid.  Hurting / discomfort in her back chest.  Has some nausea.  Coughing a lot and has some drainage. Eating and drinking ok. Children and grandchildren. Covid and daughter-in-laws had Covid.  Also had a test at work that was negative.  He continues to have some persistent cough that is productive of sputum.  She also has some nasal congestion.  She is using her inhaler, and this does seem to help with her symptoms.  She has taken multiple over-the-counter cough and cold medications, and these have really not helped at all.  Inhaler and abx  Review of Systems as above: See pertinent positives and pertinent negatives per HPI No acute distress verbally    Observations/Objective/Exam:  An attempt was made to discern vital signs over the phone and per patient if applicable and possible.   General:    Alert, Oriented, appears well and in no acute distress  Pulmonary:     On inspection no signs of respiratory distress.  Psych / Neurological:     Pleasant and cooperative.  Assessment and Plan:    ICD-10-CM   1. Acute bronchitis due to other specified organisms  J20.8    Greater than 2 weeks of symptoms after onset with negative COVID-19 testing.  Cough, congestion and production of sputum is worsened.  At this point increased risk of bacterial infection pneumonia/bronchitis.  Treat as such.  While the chart does note some difficulties with penicillin, she has taken multiple rounds of amoxicillin and Augmentin without any difficulty.  We discussed this and she has never had a problem with either of those and her penicillin difficulties were noted in her teenage years.  I discussed the assessment and treatment plan with the patient. The patient was provided an opportunity to ask questions and all were answered. The patient agreed with the plan and demonstrated an understanding of the instructions.   The patient was advised to call back or seek an in-person evaluation if the symptoms worsen or if the condition fails to improve as anticipated.  Follow-up: prn unless noted otherwise below No follow-ups on file.  Meds ordered this encounter  Medications  . albuterol (PROAIR HFA) 108 (90 Base) MCG/ACT inhaler    Sig: Inhale 2 puffs into the lungs every  6 (six) hours as needed for wheezing or shortness of breath.    Dispense:  8 g    Refill:  0  . amoxicillin (AMOXIL) 875 MG tablet    Sig: Take 1 tablet (875 mg total) by mouth 2 (two) times daily for 7 days.    Dispense:  14 tablet    Refill:  0  . fluconazole (DIFLUCAN) 150 MG tablet    Sig: Take 1 tab po today, repeat in 7 days if needed    Dispense:  2 tablet    Refill:  0   No  orders of the defined types were placed in this encounter.   Signed,  Maud Deed. Earlyn Sylvan, MD

## 2020-04-13 LAB — LIPID PANEL
Chol/HDL Ratio: 4.3 ratio (ref 0.0–4.4)
Cholesterol, Total: 152 mg/dL (ref 100–199)
HDL: 35 mg/dL — ABNORMAL LOW (ref >39)
LDL Chol Calc (NIH): 93 mg/dL (ref 0–99)
Triglycerides: 132 mg/dL (ref 0–149)
VLDL Cholesterol Cal: 24 mg/dL (ref 5–40)

## 2020-04-13 LAB — HEMOGLOBIN A1C
Est. average glucose Bld gHb Est-mCnc: 120 mg/dL
Hgb A1c MFr Bld: 5.8 % — ABNORMAL HIGH (ref 4.8–5.6)

## 2020-04-13 LAB — HEPATIC FUNCTION PANEL
ALT: 21 IU/L (ref 0–32)
AST: 25 IU/L (ref 0–40)
Albumin: 4.3 g/dL (ref 3.8–4.9)
Alkaline Phosphatase: 113 IU/L (ref 44–121)
Bilirubin Total: 1.1 mg/dL (ref 0.0–1.2)
Bilirubin, Direct: 0.25 mg/dL (ref 0.00–0.40)
Total Protein: 6.7 g/dL (ref 6.0–8.5)

## 2020-04-14 ENCOUNTER — Other Ambulatory Visit: Payer: Self-pay | Admitting: Cardiology

## 2020-05-09 ENCOUNTER — Telehealth: Payer: Self-pay

## 2020-05-09 DIAGNOSIS — E559 Vitamin D deficiency, unspecified: Secondary | ICD-10-CM

## 2020-05-09 NOTE — Telephone Encounter (Signed)
Please notify patient that she is overdue for general follow-up/CPE with me, she will need to schedule. Declining vitamin D refill for now.

## 2020-05-09 NOTE — Telephone Encounter (Signed)
Pharmacy requests refill on: Vitamin D3 1.25 mg   LAST REFILL: 11/23/2019 (Q-12, R-1) LAST OV: 07/28/2019 NEXT OV: Not Scheduled  PHARMACY: [place], [place]

## 2020-05-10 NOTE — Telephone Encounter (Signed)
Will defer refill until she's seen.

## 2020-05-10 NOTE — Telephone Encounter (Signed)
Pt scheduled for cpe 3/23. Requesting refill be sent in.

## 2020-05-10 NOTE — Telephone Encounter (Signed)
Has made cpe on 3/23app ok to call in refill?

## 2020-05-17 ENCOUNTER — Other Ambulatory Visit: Payer: Self-pay | Admitting: *Deleted

## 2020-05-17 DIAGNOSIS — E559 Vitamin D deficiency, unspecified: Secondary | ICD-10-CM

## 2020-05-17 NOTE — Telephone Encounter (Signed)
Last office visit 04/03/2020 with Dr. Lorelei Pont for bronchitis.  Last refilled 11/23/2019 for #12 with 1 refill.  Last Vit D level 11/22/2019 which was low at 17.72 ng/ml.  CPE scheduled for 05/28/2020.  Refill?

## 2020-05-29 ENCOUNTER — Encounter: Payer: Self-pay | Admitting: Primary Care

## 2020-05-29 ENCOUNTER — Other Ambulatory Visit: Payer: Self-pay | Admitting: Primary Care

## 2020-05-29 ENCOUNTER — Other Ambulatory Visit: Payer: Self-pay

## 2020-05-29 ENCOUNTER — Other Ambulatory Visit (HOSPITAL_COMMUNITY)
Admission: RE | Admit: 2020-05-29 | Discharge: 2020-05-29 | Disposition: A | Discharge status: Home / Self Care | Payer: 59 | Source: Ambulatory Visit | Attending: Primary Care | Admitting: Primary Care

## 2020-05-29 ENCOUNTER — Ambulatory Visit (INDEPENDENT_AMBULATORY_CARE_PROVIDER_SITE_OTHER): Payer: 59 | Admitting: Primary Care

## 2020-05-29 VITALS — BP 110/62 | HR 62 | Temp 98.6°F | Ht 62.0 in | Wt 180.0 lb

## 2020-05-29 DIAGNOSIS — Z124 Encounter for screening for malignant neoplasm of cervix: Secondary | ICD-10-CM | POA: Insufficient documentation

## 2020-05-29 DIAGNOSIS — N3281 Overactive bladder: Secondary | ICD-10-CM

## 2020-05-29 DIAGNOSIS — E119 Type 2 diabetes mellitus without complications: Secondary | ICD-10-CM

## 2020-05-29 DIAGNOSIS — E7849 Other hyperlipidemia: Secondary | ICD-10-CM

## 2020-05-29 DIAGNOSIS — R109 Unspecified abdominal pain: Secondary | ICD-10-CM | POA: Diagnosis not present

## 2020-05-29 DIAGNOSIS — E559 Vitamin D deficiency, unspecified: Secondary | ICD-10-CM

## 2020-05-29 DIAGNOSIS — E039 Hypothyroidism, unspecified: Secondary | ICD-10-CM

## 2020-05-29 DIAGNOSIS — K5909 Other constipation: Secondary | ICD-10-CM

## 2020-05-29 DIAGNOSIS — R002 Palpitations: Secondary | ICD-10-CM

## 2020-05-29 DIAGNOSIS — Z0000 Encounter for general adult medical examination without abnormal findings: Secondary | ICD-10-CM

## 2020-05-29 DIAGNOSIS — Z0001 Encounter for general adult medical examination with abnormal findings: Secondary | ICD-10-CM | POA: Insufficient documentation

## 2020-05-29 DIAGNOSIS — K219 Gastro-esophageal reflux disease without esophagitis: Secondary | ICD-10-CM

## 2020-05-29 DIAGNOSIS — F419 Anxiety disorder, unspecified: Secondary | ICD-10-CM

## 2020-05-29 DIAGNOSIS — R10A0 Flank pain, unspecified side: Secondary | ICD-10-CM

## 2020-05-29 DIAGNOSIS — M545 Low back pain, unspecified: Secondary | ICD-10-CM

## 2020-05-29 DIAGNOSIS — G8929 Other chronic pain: Secondary | ICD-10-CM

## 2020-05-29 DIAGNOSIS — F32A Depression, unspecified: Secondary | ICD-10-CM

## 2020-05-29 DIAGNOSIS — R519 Headache, unspecified: Secondary | ICD-10-CM

## 2020-05-29 LAB — POC URINALSYSI DIPSTICK (AUTOMATED)
Bilirubin, UA: NEGATIVE
Blood, UA: NEGATIVE
Glucose, UA: NEGATIVE
Ketones, UA: NEGATIVE
Nitrite, UA: NEGATIVE
Protein, UA: POSITIVE — AB
Spec Grav, UA: 1.02 (ref 1.010–1.025)
Urobilinogen, UA: 0.2 E.U./dL
pH, UA: 5.5 (ref 5.0–8.0)

## 2020-05-29 LAB — COMPREHENSIVE METABOLIC PANEL
ALT: 23 U/L (ref 0–35)
AST: 20 U/L (ref 0–37)
Albumin: 4.2 g/dL (ref 3.5–5.2)
Alkaline Phosphatase: 101 U/L (ref 39–117)
BUN: 8 mg/dL (ref 6–23)
CO2: 28 mEq/L (ref 19–32)
Calcium: 9.2 mg/dL (ref 8.4–10.5)
Chloride: 103 mEq/L (ref 96–112)
Creatinine, Ser: 0.58 mg/dL (ref 0.40–1.20)
GFR: 101.9 mL/min (ref >60.00)
Glucose, Bld: 95 mg/dL (ref 70–99)
Potassium: 4.3 mEq/L (ref 3.5–5.1)
Sodium: 140 mEq/L (ref 135–145)
Total Bilirubin: 1.5 mg/dL — ABNORMAL HIGH (ref 0.2–1.2)
Total Protein: 6.6 g/dL (ref 6.0–8.3)

## 2020-05-29 LAB — MICROALBUMIN / CREATININE URINE RATIO
Creatinine,U: 146 mg/dL
Microalb Creat Ratio: 0.8 mg/g (ref 0.0–30.0)
Microalb, Ur: 1.2 mg/dL (ref 0.0–1.9)

## 2020-05-29 LAB — VITAMIN D 25 HYDROXY (VIT D DEFICIENCY, FRACTURES): VITD: 57.52 ng/mL (ref 30.00–100.00)

## 2020-05-29 LAB — TSH: TSH: 1.08 u[IU]/mL (ref 0.35–4.50)

## 2020-05-29 MED ORDER — VITAMIN D3 1.25 MG (50000 UT) PO TABS: 1.0000 | ORAL_TABLET | ORAL | 1 refills | Status: DC

## 2020-05-29 NOTE — Assessment & Plan Note (Signed)
Chronic, ongoing, was told she needs surgery. Following with spine center.   Managed on Tramadol, Hydrocodone,and Methocarbamol, prescribed by spine center.

## 2020-05-29 NOTE — Assessment & Plan Note (Signed)
Stable, some breakthrough symptoms on Nexium 40 mg. Continue same.

## 2020-05-29 NOTE — Assessment & Plan Note (Signed)
Following with GI, doing well on Linzess 145 mcg. Continue same.

## 2020-05-29 NOTE — Assessment & Plan Note (Signed)
Overall feels well managed on fluoxetine 40 mg daily, using hydroxyzine 10 mg PRN. Continue current regimen.

## 2020-05-29 NOTE — Progress Notes (Signed)
Subjective:    Patient ID: Abigail [name], female    DOB: 1965/05/08, 55 y.o.   MRN: [id number]  HPI  Abigail [name] is a very pleasant 55 y.o. female who presents today for complete physical.  She believes that she is passing a renal stone. Left sided flank pain that is moving towards the left lateral side and groin. She was prescribed tamsulosin earlier this week by her Urologist, has an appointment scheduled for Friday.   She is requesting a doctors note to use her desk lamp for lighting at work as the fluorescent lights bother her and trigger migraines. She has a desk lamp that is much better.   Immunizations: -Tetanus: Unsure, over 10 years.  -Influenza: Completed this season  -Covid-19: Completed two vaccines -Shingles: Never completed.  -Pneumonia: Never completed.  Diet: She endorses a fair diet.  Exercise: She is not exercising, is active.   Eye exam: Never completed.  Dental exam: Completes regularly   Pap Smear: Due today Mammogram: 2020, typically gets them through work for free. She will have this done this year.  Colonoscopy: 2018, due 2023  BP Readings from Last 3 Encounters:  05/29/20 110/62  02/29/20 118/74  08/04/19 104/66      Review of Systems  Constitutional: Negative for unexpected weight change.  HENT: Negative for rhinorrhea.   Eyes: Negative for visual disturbance.  Respiratory: Negative for shortness of breath.   Cardiovascular: Positive for palpitations. Negative for chest pain.  Gastrointestinal: Negative for constipation and diarrhea.  Genitourinary: Negative for difficulty urinating.  Musculoskeletal: Negative for arthralgias and myalgias.  Skin: Negative for rash.  Allergic/Immunologic: Positive for environmental allergies.  Neurological: Positive for headaches. Negative for dizziness.  Psychiatric/Behavioral: The patient is not nervous/anxious.          Past Medical History:  Diagnosis Date  . Anxiety   . Bulging  lumbar disc   . CAP (community acquired pneumonia)   . Cardiac arrhythmia due to congenital heart disease    no arrhythmia identified   . Celiac disease   . Depression   . Diverticulosis    Sigmoid colon  . Fatty liver 05/2016  . Frequent headaches   . GERD (gastroesophageal reflux disease)   . Heart disease   . History of kidney stones   . Hypertension   . Kidney stones   . Migraine   . Obesity (BMI 35.0-39.9 without comorbidity)   . UTI (urinary tract infection)     Social History   Socioeconomic History  . Marital status: Single    Spouse name: Not on file  . Number of children: Not on file  . Years of education: Not on file  . Highest education level: Not on file  Occupational History  . Not on file  Tobacco Use  . Smoking status: Former Smoker    Types: Cigarettes    Quit date: 2008    Years since quitting: 14.2  . Smokeless tobacco: Never Used  . Tobacco comment: used tobacco "socially"  Vaping Use  . Vaping Use: Never used  Substance and Sexual Activity  . Alcohol use: No    Alcohol/week: 0.0 standard drinks  . Drug use: No  . Sexual activity: Not on file  Other Topics Concern  . Not on file  Social History Narrative   Single.   Has a set of Twins, age 50.   Works for the [employer] and [employer] and [employer]   Enjoys relaxing, spending time with  her children.   Social Determinants of Health   Financial Resource Strain: Not on file  Food Insecurity: Not on file  Transportation Needs: Not on file  Physical Activity: Not on file  Stress: Not on file  Social Connections: Not on file  Intimate Partner Violence: Not on file    Past Surgical History:  Procedure Laterality Date  . ABDOMINAL EXPLORATION SURGERY  1986  . COLONOSCOPY  06/2016  . ESOPHAGEAL MANOMETRY N/A 12/06/2019   Procedure: ESOPHAGEAL MANOMETRY (EM);  Surgeon: Mauri Pole, MD;  Location: [facility];  Service: Endoscopy;  Laterality: N/A;  . EXTRACORPOREAL SHOCK  WAVE LITHOTRIPSY Left 12/03/2016   Procedure: LEFT EXTRACORPOREAL SHOCK WAVE LITHOTRIPSY (ESWL);  Surgeon: Irine Seal, MD;  Location: [facility];  Service: Urology;  Laterality: Left;  . KIDNEY STONE SURGERY    . LEFT HEART CATH AND CORONARY ANGIOGRAPHY  07/28/2016    Large tortuous coronary arteries no radiographic evidence of disease.  Not anginal chest pain.  Normal EF.  [name] LITHOTRIPSY    . NASAL SINUS SURGERY    . ROTATOR CUFF REPAIR Right 12/27/2017  . TRANSTHORACIC ECHOCARDIOGRAM  06/2017   EF 60-65%.  GR 1-2 DD.  Otherwise normal echo.  Normal valve function.  Normal wall motion.  . TUBAL LIGATION      Family History  Problem Relation Age of Onset  . COPD Father   . Emphysema Father   . Gallstones Mother   . Colon cancer Neg Hx   . Stomach cancer Neg Hx   . Esophageal cancer Neg Hx     Allergies  Allergen Reactions  . Penicillins Nausea Only    Has patient had a PCN reaction causing immediate rash, facial/tongue/throat swelling, SOB or lightheadedness with hypotension: No Has patient had a PCN reaction causing severe rash involving mucus membranes or skin necrosis: No Has patient had a PCN reaction that required hospitalization: No Has patient had a PCN reaction occurring within the last 10 years: No If all of the above answers are "NO", then may proceed with Cephalosporin use.   . Latex Rash    Current Outpatient Medications on File Prior to Visit  Medication Sig Dispense Refill  . Accu-Chek FastClix Lancets MISC USE 1 LANCET TO CHECK BLOOD SUGAR THREE TIMES DAILY 102 each 5  . albuterol (PROAIR HFA) 108 (90 Base) MCG/ACT inhaler Inhale 2 puffs into the lungs every 6 (six) hours as needed for wheezing or shortness of breath. 8 g 0  . Blood Glucose Monitoring Suppl (ACCU-CHEK GUIDE) w/Device KIT by Does not apply route.    [name] esomeprazole (NEXIUM) 40 MG capsule TAKE 1 CAPSULE BY MOUTH DAILY 90 capsule 1  . EUTHYROX 75 MCG tablet TAKE 1 TABLET BY MOUTH ONCE DAILY IN THE  MORNING ON AN EMPTY STOMACH WITH WATER & NO FOOD OR OTHER MEDICATIONS FOR 30 MINUTES 90 tablet 0  . FLUoxetine (PROZAC) 40 MG capsule TAKE 1 CAPSULE BY MOUTH ONCE DAILY for depression. 90 capsule 2  . glucose blood (ACCU-CHEK GUIDE) test strip USE 1 STRIP   THREE TIMES DAILY 100 each 5  . HYDROcodone-acetaminophen (NORCO/VICODIN) 5-325 MG tablet Take 1 tablet by mouth every 6 (six) hours as needed. for pain    . hydrOXYzine (ATARAX/VISTARIL) 10 MG tablet Take 1 to 2 tablets by mouth twice daily as needed for anxiety. 30 tablet 0  . Lancets Misc. (ACCU-CHEK FASTCLIX LANCET) KIT Use as directed to test blood sugar daily 1 kit 0  . linaclotide (LINZESS)  145 MCG CAPS capsule Take 1 capsule (145 mcg total) by mouth daily before breakfast. 90 capsule 3  . methocarbamol (ROBAXIN) 500 MG tablet Take 500 mg by mouth 3 (three) times daily.    . metoprolol tartrate (LOPRESSOR) 25 MG tablet Take 1 tablet by mouth twice daily 60 tablet 10  . ondansetron (ZOFRAN ODT) 8 MG disintegrating tablet Take 1 tablet (8 mg total) by mouth every 8 (eight) hours as needed for nausea or vomiting. 20 tablet 1  . oxybutynin (DITROPAN-XL) 5 MG 24 hr tablet Take 5 mg by mouth daily.  1  . Peppermint Oil (IBGARD PO) Take by mouth.    . rosuvastatin (CRESTOR) 20 MG tablet Take 1 tablet (20 mg total) by mouth daily. Need appointment 90 tablet 1  . traMADol (ULTRAM) 50 MG tablet Take 50 mg by mouth every morning.    . Cholecalciferol (VITAMIN D3) 1.25 MG (50000 UT) TABS Take 1 tablet by mouth every 7 (seven) days. (Patient not taking: Reported on 05/29/2020) 12 tablet 1   No current facility-administered medications on file prior to visit.    BP 110/62   Pulse 62   Temp 98.6 F (37 C) (Temporal)   Ht 5' 2"  (1.575 m)   Wt 180 lb (81.6 kg)   LMP 08/12/2015 Comment: patient states hx. tubal ligation  SpO2 97%   BMI 32.92 kg/m  Objective:   Physical Exam HENT:     Right Ear: Tympanic membrane and ear canal normal.     Left  Ear: Tympanic membrane and ear canal normal.     Nose: Nose normal.  Eyes:     Conjunctiva/sclera: Conjunctivae normal.     Pupils: Pupils are equal, round, and reactive to light.  Neck:     Thyroid: No thyromegaly.  Cardiovascular:     Rate and Rhythm: Normal rate and regular rhythm.     Heart sounds: No murmur heard.   Pulmonary:     Effort: Pulmonary effort is normal.     Breath sounds: Normal breath sounds. No rales.  Abdominal:     General: Bowel sounds are normal.     Palpations: Abdomen is soft.     Tenderness: There is no abdominal tenderness.  Genitourinary:    Labia:        Right: No tenderness or lesion.        Left: No tenderness or lesion.      Vagina: Normal.     Cervix: Normal.     Uterus: Normal.      Adnexa: Right adnexa normal and left adnexa normal.  Musculoskeletal:        General: Normal range of motion.     Cervical back: Neck supple.  Lymphadenopathy:     Cervical: No cervical adenopathy.  Skin:    General: Skin is warm and dry.     Findings: No rash.  Neurological:     Mental Status: She is alert and oriented to person, place, and time.     Cranial Nerves: No cranial nerve deficit.     Deep Tendon Reflexes: Reflexes are normal and symmetric.  Psychiatric:        Mood and Affect: Mood normal.           Assessment & Plan:      This visit occurred during the SARS-CoV-2 public health emergency.  Safety protocols were in place, including screening questions prior to the visit, additional usage of staff PPE, and extensive cleaning of exam room while  observing appropriate contact time as indicated for disinfecting solutions.

## 2020-05-29 NOTE — Assessment & Plan Note (Signed)
Chronic, agree to provide work note to use desk lamp without fluorescent lighting.   She will update if headaches persist. Also recommended an eye exam.

## 2020-05-29 NOTE — Assessment & Plan Note (Signed)
A1C of 5.8 one month ago, has not taken Metformin XR 500 mg in 6+ months.   Continue off Metformin XR. Managed on statin.  Declines pneumonia vaccine.  Urine micro pending today.  Repeat A1C in 5 months.

## 2020-05-29 NOTE — Addendum Note (Signed)
Addended by: Ellamae Sia on: 05/29/2020 11:57 AM   Modules accepted: Orders

## 2020-05-29 NOTE — Assessment & Plan Note (Signed)
Declines tetanus, Shingrix, pneumonia vaccine despite recommendations.  Pap smear due, completed today. Mammogram due, orders placed. Colonoscopy UTD due in 2023.  Discussed the importance of a healthy diet and regular exercise in order for weight loss, and to reduce the risk of any potential medical problems.  Exam today stable. Labs reviewed.

## 2020-05-29 NOTE — Patient Instructions (Signed)
Stop by the lab prior to leaving today. I will notify you of your results once received.   Continue tamsulosin daily for presumed kidney stone.   Continue exercising. You should be getting 150 minutes of moderate intensity exercise weekly.  Continue to work on a healthy diet. Ensure you are consuming 64 ounces of water daily.  Complete your mammogram this year.  Schedule a lab appointment for 5 months to repeat your A1C.  It was a pleasure to see you today!   Preventive Care 9-65 Years Old, Female Preventive care refers to lifestyle choices and visits with your health care provider that can promote health and wellness. This includes:  A yearly physical exam. This is also called an annual wellness visit.  Regular dental and eye exams.  Immunizations.  Screening for certain conditions.  Healthy lifestyle choices, such as: ? Eating a healthy diet. ? Getting regular exercise. ? Not using drugs or products that contain nicotine and tobacco. ? Limiting alcohol use. What can I expect for my preventive care visit? Physical exam Your health care provider will check your:  Height and weight. These may be used to calculate your BMI (body mass index). BMI is a measurement that tells if you are at a healthy weight.  Heart rate and blood pressure.  Body temperature.  Skin for abnormal spots. Counseling Your health care provider may ask you questions about your:  Past medical problems.  Family's medical history.  Alcohol, tobacco, and drug use.  Emotional well-being.  Home life and relationship well-being.  Sexual activity.  Diet, exercise, and sleep habits.  Work and work Statistician.  Access to firearms.  Method of birth control.  Menstrual cycle.  Pregnancy history. What immunizations do I need? Vaccines are usually given at various ages, according to a schedule. Your health care provider will recommend vaccines for you based on your age, medical history, and  lifestyle or other factors, such as travel or where you work.   What tests do I need? Blood tests  Lipid and cholesterol levels. These may be checked every 5 years, or more often if you are over 45 years old.  Hepatitis C test.  Hepatitis B test. Screening  Lung cancer screening. You may have this screening every year starting at age 23 if you have a 30-pack-year history of smoking and currently smoke or have quit within the past 15 years.  Colorectal cancer screening. ? All adults should have this screening starting at age 3 and continuing until age 60. ? Your health care provider may recommend screening at age 60 if you are at increased risk. ? You will have tests every 1-10 years, depending on your results and the type of screening test.  Diabetes screening. ? This is done by checking your blood sugar (glucose) after you have not eaten for a while (fasting). ? You may have this done every 1-3 years.  Mammogram. ? This may be done every 1-2 years. ? Talk with your health care provider about when you should start having regular mammograms. This may depend on whether you have a family history of breast cancer.  BRCA-related cancer screening. This may be done if you have a family history of breast, ovarian, tubal, or peritoneal cancers.  Pelvic exam and Pap test. ? This may be done every 3 years starting at age 21. ? Starting at age 60, this may be done every 5 years if you have a Pap test in combination with an HPV test. Other tests  STD (sexually transmitted disease) testing, if you are at risk.  Bone density scan. This is done to screen for osteoporosis. You may have this scan if you are at high risk for osteoporosis. Talk with your health care provider about your test results, treatment options, and if necessary, the need for more tests. Follow these instructions at home: Eating and drinking  Eat a diet that includes fresh fruits and vegetables, whole grains, lean protein,  and low-fat dairy products.  Take vitamin and mineral supplements as recommended by your health care provider.  Do not drink alcohol if: ? Your health care provider tells you not to drink. ? You are pregnant, may be pregnant, or are planning to become pregnant.  If you drink alcohol: ? Limit how much you have to 0-1 drink a day. ? Be aware of how much alcohol is in your drink. In the [place] one drink equals one 12 oz bottle of beer (355 mL), one 5 oz glass of wine (148 mL), or one 1 oz glass of hard liquor (44 mL).   Lifestyle  Take daily care of your teeth and gums. Brush your teeth every morning and night with fluoride toothpaste. Floss one time each day.  Stay active. Exercise for at least 30 minutes 5 or more days each week.  Do not use any products that contain nicotine or tobacco, such as cigarettes, e-cigarettes, and chewing tobacco. If you need help quitting, ask your health care provider.  Do not use drugs.  If you are sexually active, practice safe sex. Use a condom or other form of protection to prevent STIs (sexually transmitted infections).  If you do not wish to become pregnant, use a form of birth control. If you plan to become pregnant, see your health care provider for a prepregnancy visit.  If told by your health care provider, take low-dose aspirin daily starting at age 33.  Find healthy ways to cope with stress, such as: ? Meditation, yoga, or listening to music. ? Journaling. ? Talking to a trusted person. ? Spending time with friends and family. Safety  Always wear your seat belt while driving or riding in a vehicle.  Do not drive: ? If you have been drinking alcohol. Do not ride with someone who has been drinking. ? When you are tired or distracted. ? While texting.  Wear a helmet and other protective equipment during sports activities.  If you have firearms in your house, make sure you follow all gun safety procedures. What's next?  Visit your  health care provider once a year for an annual wellness visit.  Ask your health care provider how often you should have your eyes and teeth checked.  Stay up to date on all vaccines. This information is not intended to replace advice given to you by your health care provider. Make sure you discuss any questions you have with your health care provider. Document Revised: 11/28/2019 Document Reviewed: 11/04/2017 Elsevier Patient Education  2021 [employer].

## 2020-05-29 NOTE — Assessment & Plan Note (Signed)
Compliant to rosuvastatin 20 mg, repeat lipid panel from February 2022 reviewed.

## 2020-05-29 NOTE — Assessment & Plan Note (Signed)
Doing well on oxybutynin XL 5 mg, continue same.

## 2020-05-29 NOTE — Assessment & Plan Note (Signed)
Managed on 50,000 IU weekly, no vitamin D in 2 weeks. Repeat level pending today, if low then will need refills.

## 2020-05-29 NOTE — Assessment & Plan Note (Signed)
History of recurrent renal stones, follows with Urology and is scheduled for this week.  UA today with 1+ leuks, negative nitrites and blood. Culture pending.  Continue tamsulosin 0.4 mg per Urology.

## 2020-05-29 NOTE — Assessment & Plan Note (Signed)
Stable, continued. Continue metoprolol tartrate 25 mg BID.

## 2020-05-29 NOTE — Assessment & Plan Note (Signed)
Compliant to Euthyrox 75 mcg daily, takes correctly. Repeat TSH pending.

## 2020-05-30 LAB — URINE CULTURE
MICRO NUMBER:: 11682832
Result:: NO GROWTH
SPECIMEN QUALITY:: ADEQUATE

## 2020-06-03 LAB — CYTOLOGY - PAP
Comment: NEGATIVE
Diagnosis: NEGATIVE
High risk HPV: NEGATIVE

## 2020-06-05 ENCOUNTER — Other Ambulatory Visit: Payer: Self-pay | Admitting: Internal Medicine

## 2020-06-05 DIAGNOSIS — E034 Atrophy of thyroid (acquired): Secondary | ICD-10-CM

## 2020-06-30 ENCOUNTER — Other Ambulatory Visit: Payer: Self-pay | Admitting: Primary Care

## 2020-06-30 DIAGNOSIS — F419 Anxiety disorder, unspecified: Secondary | ICD-10-CM

## 2020-06-30 DIAGNOSIS — F32A Depression, unspecified: Secondary | ICD-10-CM

## 2020-07-19 ENCOUNTER — Other Ambulatory Visit: Payer: Self-pay

## 2020-07-19 ENCOUNTER — Ambulatory Visit: Payer: 59 | Admitting: Primary Care

## 2020-07-19 ENCOUNTER — Encounter: Payer: Self-pay | Admitting: Primary Care

## 2020-07-19 VITALS — BP 120/64 | HR 57 | Temp 96.0°F | Ht 62.0 in | Wt 180.0 lb

## 2020-07-19 DIAGNOSIS — R339 Retention of urine, unspecified: Secondary | ICD-10-CM | POA: Insufficient documentation

## 2020-07-19 DIAGNOSIS — E119 Type 2 diabetes mellitus without complications: Secondary | ICD-10-CM

## 2020-07-19 LAB — POC URINALSYSI DIPSTICK (AUTOMATED)
Bilirubin, UA: NEGATIVE
Blood, UA: NEGATIVE
Glucose, UA: NEGATIVE
Ketones, UA: NEGATIVE
Nitrite, UA: NEGATIVE
Protein, UA: NEGATIVE
Spec Grav, UA: 1.015 (ref 1.010–1.025)
Urobilinogen, UA: 0.2 E.U./dL
pH, UA: 6 (ref 5.0–8.0)

## 2020-07-19 MED ORDER — ACCU-CHEK GUIDE VI STRP: ORAL_STRIP | 1 refills | Status: DC

## 2020-07-19 NOTE — Assessment & Plan Note (Addendum)
Acute for the last 2 months, more so urinary frequency, could also be experiencing vaginal symptoms but declines a vaginal exam today.  UA today with trace leuks, otherwise negative. Culture sent.  She will try OTC vaginal treatment and update next week. Consider repeat renal stone study if needed as symptoms could be from renal stone.   She does have tamsuloin at home, consider resuming.

## 2020-07-19 NOTE — Progress Notes (Signed)
Subjective:    Patient ID: Abigail [name], female    DOB: 1965/11/28, 55 y.o.   MRN: [id number]  HPI  Abigail [name] is a very pleasant 55 y.o. female with a history of chronic constipation, overactive bladder, type 2 diabetes, renal stones, chronic back pain who presents today to discuss urinary frequency.  She also feels like her bladder is full again after she urinates, feels like she's either leaking urine or has a vaginal discharge. This began in late March 2022.   She denies vaginal itching, vaginal burning, hematuria, dysuria. She's not noticed any vaginal discharge in her underwear.   She is following with Urology, had a scan for renal stone evaluation and was told that she had a stone in the left kidney. She's questioning if the stone is moving.   Wt Readings from Last 3 Encounters:  07/19/20 180 lb (81.6 kg)  05/29/20 180 lb (81.6 kg)  04/03/20 181 lb (82.1 kg)      Review of Systems  Gastrointestinal: Positive for constipation. Negative for abdominal pain.  Genitourinary: Negative for dysuria, flank pain, hematuria and vaginal discharge.         Past Medical History:  Diagnosis Date  . Anxiety   . Bulging lumbar disc   . CAP (community acquired pneumonia)   . Cardiac arrhythmia due to congenital heart disease    no arrhythmia identified   . Celiac disease   . Depression   . Diverticulosis    Sigmoid colon  . Fatty liver 05/2016  . Frequent headaches   . GERD (gastroesophageal reflux disease)   . Heart disease   . History of kidney stones   . Hypertension   . Kidney stones   . Migraine   . Obesity (BMI 35.0-39.9 without comorbidity)   . UTI (urinary tract infection)     Social History   Socioeconomic History  . Marital status: Single    Spouse name: Not on file  . Number of children: Not on file  . Years of education: Not on file  . Highest education level: Not on file  Occupational History  . Not on file  Tobacco Use  . Smoking  status: Former Smoker    Types: Cigarettes    Quit date: 2008    Years since quitting: 14.3  . Smokeless tobacco: Never Used  . Tobacco comment: used tobacco "socially"  Vaping Use  . Vaping Use: Never used  Substance and Sexual Activity  . Alcohol use: No    Alcohol/week: 0.0 standard drinks  . Drug use: No  . Sexual activity: Not on file  Other Topics Concern  . Not on file  Social History Narrative   Single.   Has a set of Twins, age 48.   Works for the [employer] and [employer] and [employer]   Enjoys relaxing, spending time with her children.   Social Determinants of Health   Financial Resource Strain: Not on file  Food Insecurity: Not on file  Transportation Needs: Not on file  Physical Activity: Not on file  Stress: Not on file  Social Connections: Not on file  Intimate Partner Violence: Not on file    Past Surgical History:  Procedure Laterality Date  . ABDOMINAL EXPLORATION SURGERY  1986  . COLONOSCOPY  06/2016  . ESOPHAGEAL MANOMETRY N/A 12/06/2019   Procedure: ESOPHAGEAL MANOMETRY (EM);  Surgeon: Mauri Pole, MD;  Location: [facility];  Service: Endoscopy;  Laterality: N/A;  . EXTRACORPOREAL SHOCK WAVE  LITHOTRIPSY Left 12/03/2016   Procedure: LEFT EXTRACORPOREAL SHOCK WAVE LITHOTRIPSY (ESWL);  Surgeon: Irine Seal, MD;  Location: [facility];  Service: Urology;  Laterality: Left;  . KIDNEY STONE SURGERY    . LEFT HEART CATH AND CORONARY ANGIOGRAPHY  07/28/2016    Large tortuous coronary arteries no radiographic evidence of disease.  Not anginal chest pain.  Normal EF.  [name] LITHOTRIPSY    . NASAL SINUS SURGERY    . ROTATOR CUFF REPAIR Right 12/27/2017  . TRANSTHORACIC ECHOCARDIOGRAM  06/2017   EF 60-65%.  GR 1-2 DD.  Otherwise normal echo.  Normal valve function.  Normal wall motion.  . TUBAL LIGATION      Family History  Problem Relation Age of Onset  . COPD Father   . Emphysema Father   . Gallstones Mother   . Colon cancer Neg Hx   . Stomach  cancer Neg Hx   . Esophageal cancer Neg Hx     Allergies  Allergen Reactions  . Penicillins Nausea Only    Has patient had a PCN reaction causing immediate rash, facial/tongue/throat swelling, SOB or lightheadedness with hypotension: No Has patient had a PCN reaction causing severe rash involving mucus membranes or skin necrosis: No Has patient had a PCN reaction that required hospitalization: No Has patient had a PCN reaction occurring within the last 10 years: No If all of the above answers are "NO", then may proceed with Cephalosporin use.   . Latex Rash    Current Outpatient Medications on File Prior to Visit  Medication Sig Dispense Refill  . Accu-Chek FastClix Lancets MISC USE 1 LANCET TO CHECK BLOOD SUGAR THREE TIMES DAILY 102 each 5  . albuterol (PROAIR HFA) 108 (90 Base) MCG/ACT inhaler Inhale 2 puffs into the lungs every 6 (six) hours as needed for wheezing or shortness of breath. 8 g 0  . Blood Glucose Monitoring Suppl (ACCU-CHEK GUIDE) w/Device KIT by Does not apply route.    . Cholecalciferol (VITAMIN D3) 1.25 MG (50000 UT) TABS Take 1 tablet by mouth every 7 (seven) days. 12 tablet 1  . esomeprazole (NEXIUM) 40 MG capsule TAKE 1 CAPSULE BY MOUTH DAILY 90 capsule 1  . EUTHYROX 75 MCG tablet TAKE 1 TABLET BY MOUTH IN THE MORNING ON  AN  EMPTY  STOMACH  WITH  WATER  &  NO  FOOD  OR  OTHER  MEDICATIONS  FOR  30  MINUTES 90 tablet 0  . FLUoxetine (PROZAC) 40 MG capsule Take 1 capsule (40 mg total) by mouth daily. For anxiety 90 capsule 3  . HYDROcodone-acetaminophen (NORCO/VICODIN) 5-325 MG tablet Take 1 tablet by mouth every 6 (six) hours as needed. for pain    . hydrOXYzine (ATARAX/VISTARIL) 10 MG tablet Take 1 to 2 tablets by mouth twice daily as needed for anxiety. 30 tablet 0  . Lancets Misc. (ACCU-CHEK FASTCLIX LANCET) KIT Use as directed to test blood sugar daily 1 kit 0  . linaclotide (LINZESS) 145 MCG CAPS capsule Take 1 capsule (145 mcg total) by mouth daily before  breakfast. 90 capsule 3  . methocarbamol (ROBAXIN) 500 MG tablet Take 500 mg by mouth 3 (three) times daily.    . metoprolol tartrate (LOPRESSOR) 25 MG tablet Take 1 tablet by mouth twice daily 60 tablet 10  . ondansetron (ZOFRAN ODT) 8 MG disintegrating tablet Take 1 tablet (8 mg total) by mouth every 8 (eight) hours as needed for nausea or vomiting. 20 tablet 1  . oxybutynin (DITROPAN-XL) 5 MG  24 hr tablet Take 5 mg by mouth daily.  1  . Peppermint Oil (IBGARD PO) Take by mouth.    . rosuvastatin (CRESTOR) 20 MG tablet Take 1 tablet (20 mg total) by mouth daily. Need appointment 90 tablet 1  . traMADol (ULTRAM) 50 MG tablet Take 50 mg by mouth every morning.    . ondansetron (ZOFRAN) 4 MG tablet Take 4 mg by mouth every 6 (six) hours as needed.    . tamsulosin (FLOMAX) 0.4 MG CAPS capsule Take 0.4 mg by mouth at bedtime.     No current facility-administered medications on file prior to visit.    BP 120/64   Pulse (!) 57   Temp (!) 96 F (35.6 C) (Temporal)   Ht 5' 2"  (1.575 m)   Wt 180 lb (81.6 kg)   LMP 08/12/2015 Comment: patient states hx. tubal ligation  SpO2 98%   BMI 32.92 kg/m  Objective:   Physical Exam Constitutional:      General: She is not in acute distress.    Appearance: She is not ill-appearing.  Cardiovascular:     Rate and Rhythm: Normal rate.  Pulmonary:     Effort: Pulmonary effort is normal.  Musculoskeletal:     Cervical back: Neck supple.  Skin:    General: Skin is warm and dry.           Assessment & Plan:      This visit occurred during the SARS-CoV-2 public health emergency.  Safety protocols were in place, including screening questions prior to the visit, additional usage of staff PPE, and extensive cleaning of exam room while observing appropriate contact time as indicated for disinfecting solutions.

## 2020-07-19 NOTE — Patient Instructions (Signed)
We will be in touch regarding your urine specimen.  Please update me next week as discussed.  It was a pleasure to see you today!

## 2020-07-21 LAB — URINE CULTURE
MICRO NUMBER:: 11888194
Result:: NO GROWTH
SPECIMEN QUALITY:: ADEQUATE

## 2020-07-22 ENCOUNTER — Other Ambulatory Visit: Payer: Self-pay

## 2020-07-22 ENCOUNTER — Ambulatory Visit: Payer: 59 | Admitting: Cardiology

## 2020-07-22 ENCOUNTER — Encounter: Payer: Self-pay | Admitting: Cardiology

## 2020-07-22 VITALS — BP 112/66 | HR 63 | Ht 62.0 in | Wt 177.0 lb

## 2020-07-22 DIAGNOSIS — E668 Other obesity: Secondary | ICD-10-CM

## 2020-07-22 DIAGNOSIS — E119 Type 2 diabetes mellitus without complications: Secondary | ICD-10-CM | POA: Diagnosis not present

## 2020-07-22 DIAGNOSIS — E1169 Type 2 diabetes mellitus with other specified complication: Secondary | ICD-10-CM

## 2020-07-22 DIAGNOSIS — R002 Palpitations: Secondary | ICD-10-CM

## 2020-07-22 DIAGNOSIS — E785 Hyperlipidemia, unspecified: Secondary | ICD-10-CM

## 2020-07-22 NOTE — Progress Notes (Signed)
Primary Care Provider: [name], NP Cardiologist: Glenetta Hew, MD Electrophysiologist: None  Clinic Note: Chief Complaint  Patient presents with  . Follow-up    Doing well.  . Palpitations    Well-controlled.   ===================================  ASSESSMENT/PLAN   Problem List Items Addressed This Visit    Moderate obesity (Chronic)    Relatively stable weight having plateaued a little bit and her weight loss.  Hopefully with continued diet and exercise change, she should build drop below the "obesity "threshold".  Current BMI is 32.3.      Relevant Orders   Hemoglobin A1c   Lipid panel   Comprehensive metabolic panel   Hyperlipidemia associated with type 2 diabetes mellitus ([place]) - Primary (Chronic)    Unfortunately, her LDL did not drop as we expected with the rosuvastatin 20 mg.  She should be due for follow-up labs before her next appointment.  If not at goal at that time, would probably increase to higher dose of rosuvastatin.   For baseline risk ratification, we will check a Coronary Calcium Score which will help determine what the LDL target should be.      Relevant Orders   Hemoglobin A1c   Lipid panel   Comprehensive metabolic panel   Palpitations (Chronic)    Mostly related symptomatic PVCs.  Pretty well controlled on current dose of metoprolol.  With resting heart rate of 63 bpm, and controlled symptoms, would to simply continue current dose.      Relevant Orders   Hemoglobin A1c   Lipid panel   Comprehensive metabolic panel   Type 2 diabetes mellitus without complication, without long-term current use of insulin (HCC) (Chronic)    No longer on metformin because of hypoglycemia.  Current A1c is 5.8.        ===================================  HPI:    Abigail [name] is a 55 y.o. female with a PMH notable for PACs and PVCs along with DM-2 and Hyperlipidemia below who presents today for 85-monthfollow-up.  CZsofia Prout [name] was last seen on February 29, 2020.  She was doing fairly well.  Trying to make changes to her diet and try to increase her exercise.  She lost about 16 pounds.  Energy level improving.  Less palpitations with more activity.  Really notes the palpitations when she is sedentary.  Recent Hospitalizations: None  Reviewed  CV studies:    The following studies were reviewed today: (if available, images/films reviewed: From Epic Chart or Care Everywhere) . None:   Interval History:   [place] for 666-monthollow-up to discuss results of her lipids.  She is somewhat upset about the fact that her lipids have not improved, but happy that the A1c is down below 6.  She actually stopped taking metformin because of low blood sugar levels.  She is still doing a good job with her diet and is try to keep her exercise going.  Has plateaued on weight loss, but continues to work on her diet and exercise.  Palpitations are also relatively well controlled.  Every now and then, when she tries to settle down the end of the day she will feel some palpitations, but nothing that worries her now.  CV Review of Symptoms (Summary): no chest pain or dyspnea on exertion positive for - palpitations and Notably improved negative for - edema, orthopnea, paroxysmal nocturnal dyspnea, rapid heart rate, shortness of breath or Lightheadedness, dizziness, wooziness or syncope/near syncope, TIA/amaurosis fugax, claudication  REVIEWED  OF SYSTEMS   Review of Systems  Constitutional: Negative for malaise/fatigue and weight loss (Has plateaued).  HENT: Negative for congestion and sinus pain.   Respiratory: Negative for cough and shortness of breath.   Gastrointestinal: Negative for blood in stool and melena.  Genitourinary: Negative for hematuria.  Musculoskeletal: Positive for back pain and myalgias.       Not as frequent.  Not requiring Robaxin  Neurological: Negative for dizziness (Occasional  vertigo) and weakness.  Psychiatric/Behavioral: Negative for depression and memory loss. The patient is nervous/anxious (Much less frequent). The patient does not have insomnia.     I have reviewed and (if needed) personally updated the patient's problem list, medications, allergies, past medical and surgical history, social and family history.   PAST MEDICAL HISTORY   Past Medical History:  Diagnosis Date  . Anxiety   . Bulging lumbar disc   . CAP (community acquired pneumonia)   . Cardiac arrhythmia due to congenital heart disease    no arrhythmia identified   . Celiac disease   . Depression   . Diverticulosis    Sigmoid colon  . Fatty liver 05/2016  . Frequent headaches   . GERD (gastroesophageal reflux disease)   . Heart disease   . History of kidney stones   . Hypertension   . Kidney stones   . Migraine   . Obesity (BMI 35.0-39.9 without comorbidity)   . UTI (urinary tract infection)     PAST SURGICAL HISTORY   Past Surgical History:  Procedure Laterality Date  . ABDOMINAL EXPLORATION SURGERY  1986  . COLONOSCOPY  06/2016  . ESOPHAGEAL MANOMETRY N/A 12/06/2019   Procedure: ESOPHAGEAL MANOMETRY (EM);  Surgeon: Mauri Pole, MD;  Location: [facility];  Service: Endoscopy;  Laterality: N/A;  . EXTRACORPOREAL SHOCK WAVE LITHOTRIPSY Left 12/03/2016   Procedure: LEFT EXTRACORPOREAL SHOCK WAVE LITHOTRIPSY (ESWL);  Surgeon: Irine Seal, MD;  Location: [facility];  Service: Urology;  Laterality: Left;  . KIDNEY STONE SURGERY    . LEFT HEART CATH AND CORONARY ANGIOGRAPHY  07/28/2016    Large tortuous coronary arteries no radiographic evidence of disease.  Not anginal chest pain.  Normal EF.  [name] LITHOTRIPSY    . NASAL SINUS SURGERY    . ROTATOR CUFF REPAIR Right 12/27/2017  . TRANSTHORACIC ECHOCARDIOGRAM  06/2017   EF 60-65%.  GR 1-2 DD.  Otherwise normal echo.  Normal valve function.  Normal wall motion.  . TUBAL LIGATION      Immunization History  Administered  Date(s) Administered  . Influenza-Unspecified 12/10/2014, 01/08/2020  . PFIZER(Purple Top)SARS-COV-2 Vaccination 07/06/2019, 07/27/2019    MEDICATIONS/ALLERGIES   Current Meds  Medication Sig  . Accu-Chek FastClix Lancets MISC USE 1 LANCET TO CHECK BLOOD SUGAR THREE TIMES DAILY  . albuterol (PROAIR HFA) 108 (90 Base) MCG/ACT inhaler Inhale 2 puffs into the lungs every 6 (six) hours as needed for wheezing or shortness of breath.  . Cholecalciferol (VITAMIN D3) 1.25 MG (50000 UT) TABS Take 1 tablet by mouth every 7 (seven) days.  . EUTHYROX 75 MCG tablet TAKE 1 TABLET BY MOUTH IN THE MORNING ON  AN  EMPTY  STOMACH  WITH  WATER  &  NO  FOOD  OR  OTHER  MEDICATIONS  FOR  30  MINUTES  . FLUoxetine (PROZAC) 40 MG capsule Take 1 capsule (40 mg total) by mouth daily. For anxiety  . HYDROcodone-acetaminophen (NORCO/VICODIN) 5-325 MG tablet Take 1 tablet by mouth every 6 (six) hours as needed. for  pain  . Lancets Misc. (ACCU-CHEK FASTCLIX LANCET) KIT Use as directed to test blood sugar daily  . linaclotide (LINZESS) 145 MCG CAPS capsule Take 1 capsule (145 mcg total) by mouth daily before breakfast.  . methocarbamol (ROBAXIN) 500 MG tablet Take 500 mg by mouth 3 (three) times daily.  . metoprolol tartrate (LOPRESSOR) 25 MG tablet Take 1 tablet by mouth twice daily  . ondansetron (ZOFRAN) 4 MG tablet Take 4 mg by mouth every 6 (six) hours as needed.  [name] oxybutynin (DITROPAN-XL) 5 MG 24 hr tablet Take 5 mg by mouth daily.  . rosuvastatin (CRESTOR) 20 MG tablet Take 1 tablet (20 mg total) by mouth daily. Need appointment  . tamsulosin (FLOMAX) 0.4 MG CAPS capsule Take 0.4 mg by mouth at bedtime.    Allergies  Allergen Reactions  . Penicillins Nausea Only    Has patient had a PCN reaction causing immediate rash, facial/tongue/throat swelling, SOB or lightheadedness with hypotension: No Has patient had a PCN reaction causing severe rash involving mucus membranes or skin necrosis: No Has patient had a  PCN reaction that required hospitalization: No Has patient had a PCN reaction occurring within the last 10 years: No If all of the above answers are "NO", then may proceed with Cephalosporin use.   . Latex Rash    SOCIAL HISTORY/FAMILY HISTORY   Reviewed in Epic:  Pertinent findings:  Social History   Tobacco Use  . Smoking status: Former Smoker    Types: Cigarettes    Quit date: 2008    Years since quitting: 14.4  . Smokeless tobacco: Never Used  . Tobacco comment: used tobacco "socially"  Vaping Use  . Vaping Use: Never used  Substance Use Topics  . Alcohol use: No    Alcohol/week: 0.0 standard drinks  . Drug use: No   Social History   Social History Narrative   Single.   Has a set of Twins, age 50.   Works for the [employer] and [employer] and [employer]   Enjoys relaxing, spending time with her children.    OBJCTIVE -PE, EKG, labs   Wt Readings from Last 3 Encounters:  07/22/20 177 lb (80.3 kg)  07/19/20 180 lb (81.6 kg)  05/29/20 180 lb (81.6 kg)    Physical Exam: BP 112/66   Pulse 63   Ht 5' 2"  (1.575 m)   Wt 177 lb (80.3 kg)   LMP 08/12/2015 Comment: patient states hx. tubal ligation  SpO2 96%   BMI 32.37 kg/m  Physical Exam Vitals reviewed.  Constitutional:      General: She is not in acute distress.    Appearance: Normal appearance. She is obese. She is not ill-appearing or toxic-appearing.     Comments: Well-groomed.  Healthy-appearing  HENT:     Head: Normocephalic and atraumatic.  Neck:     Vascular: No carotid bruit, hepatojugular reflux or JVD.  Cardiovascular:     Rate and Rhythm: Normal rate and regular rhythm.  No extrasystoles are present.    Chest Wall: PMI is not displaced.     Pulses: Normal pulses.     Heart sounds: S1 normal and S2 normal. Heart sounds are distant. No murmur heard. No friction rub. No gallop.   Pulmonary:     Effort: Pulmonary effort is normal. No respiratory distress.     Breath sounds: Normal breath  sounds.  Chest:     Chest wall: No tenderness.  Musculoskeletal:        General:  No swelling.     Cervical back: Normal range of motion and neck supple.  Neurological:     General: No focal deficit present.     Mental Status: She is alert and oriented to person, place, and time.  Psychiatric:        Mood and Affect: Mood normal.        Behavior: Behavior normal.        Thought Content: Thought content normal.        Judgment: Judgment normal.     Adult ECG Report Not checked  Recent Labs: Reviewed Lab Results  Component Value Date   CHOL 152 04/12/2020   HDL 35 (L) 04/12/2020   LDLCALC 93 04/12/2020   TRIG 132 04/12/2020   CHOLHDL 4.3 04/12/2020   Lab Results  Component Value Date   CREATININE 0.58 05/29/2020   BUN 8 05/29/2020   NA 140 05/29/2020   K 4.3 05/29/2020   CL 103 05/29/2020   CO2 28 05/29/2020   CBC Latest Ref Rng & Units 11/22/2019 11/30/2018 07/01/2018  WBC 4.0 - 10.5 K/uL 7.7 7.5 10.6(H)  Hemoglobin 12.0 - 15.0 g/dL 12.6 13.4 12.2  Hematocrit 36.0 - 46.0 % 38.6 40.8 38.7  Platelets 150.0 - 400.0 K/uL 248.0 281.0 251    Lab Results  Component Value Date   TSH 1.08 05/29/2020    ==================================================  COVID-19 Education: The signs and symptoms of COVID-19 were discussed with the patient and how to seek care for testing (follow up with PCP or arrange E-visit).    I spent a total of 34mnutes with the patient spent in direct patient consultation.  Additional time spent with chart review  / charting (studies, outside notes, etc): 8 min Total Time: 36 min   Current medicines are reviewed at length with the patient today.  (+/- concerns) n/a  This visit occurred during the SARS-CoV-2 public health emergency.  Safety protocols were in place, including screening questions prior to the visit, additional usage of staff PPE, and extensive cleaning of exam room while observing appropriate contact time as indicated for  disinfecting solutions.  Notice: This dictation was prepared with Dragon dictation along with smaller phrase technology. Any transcriptional errors that result from this process are unintentional and may not be corrected upon review.  Patient Instructions / Medication Changes & Studies & Tests Ordered   Patient Instructions  Medication Instructions:  No changes   *If you need a refill on your cardiac medications before your next appointment, please call your pharmacy*   Lab Work: Lipid- fasting  CMP  HgA1c before next appointment May 2023   If you have labs (blood work) drawn today and your tests are completely normal, you will receive your results only by: .Marland KitchenMyChart Message (if you have MyChart) OR . A paper copy in the mail If you have any lab test that is abnormal or we need to change your treatment, we will call you to review the results.   Testing/Procedures:  Not needed  Follow-Up: [facility] and your health needs are our priority.  As part of our continuing mission to provide you with exceptional heart care, we have created designated Provider Care Teams.  These Care Teams include your primary Cardiologist (physician) and Advanced Practice Providers (APPs -  Physician Assistants and Nurse Practitioners) who all work together to provide you with the care you need, when you need it.     Your next appointment:   12 month(s)  The  format for your next appointment:   In Person  Provider:   Glenetta Hew, MD   Other Instructions Continue with diet and exercise   Think about having a Coronary Calcium Score.This is $99 out of pocket.   Coronary CalciumScan A coronary calcium scan is an imaging test used to look for deposits of calcium and other fatty materials (plaques) in the inner lining of the blood vessels of the heart (coronary arteries). These deposits of calcium and plaques can partly clog and narrow the coronary arteries without producing any  symptoms or warning signs. This puts a person at risk for a heart attack. This test can detect these deposits before symptoms develop. Tell a health care provider about:  Any allergies you have.  All medicines you are taking, including vitamins, herbs, eye drops, creams, and over-the-counter medicines.  Any problems you or family members have had with anesthetic medicines.  Any blood disorders you have.  Any surgeries you have had.  Any medical conditions you have.  Whether you are pregnant or may be pregnant. What are the risks? Generally, this is a safe procedure. However, problems may occur, including:  Harm to a pregnant woman and her unborn baby. This test involves the use of radiation. Radiation exposure can be dangerous to a pregnant woman and her unborn baby. If you are pregnant, you generally should not have this procedure done.  Slight increase in the risk of cancer. This is because of the radiation involved in the test. What happens before the procedure? No preparation is needed for this procedure. What happens during the procedure?  You will undress and remove any jewelry around your neck or chest.  You will put on a hospital gown.  Sticky electrodes will be placed on your chest. The electrodes will be connected to an electrocardiogram (ECG) machine to record a tracing of the electrical activity of your heart.  A CT scanner will take pictures of your heart. During this time, you will be asked to lie still and hold your breath for 2-3 seconds while a picture of your heart is being taken. The procedure may vary among health care providers and hospitals. What happens after the procedure?  You can get dressed.  You can return to your normal activities.  It is up to you to get the results of your test. Ask your health care provider, or the department that is doing the test, when your results will be ready. Summary  A coronary calcium scan is an imaging test used to  look for deposits of calcium and other fatty materials (plaques) in the inner lining of the blood vessels of the heart (coronary arteries).  Generally, this is a safe procedure. Tell your health care provider if you are pregnant or may be pregnant.  No preparation is needed for this procedure.  A CT scanner will take pictures of your heart.  You can return to your normal activities after the scan is done. This information is not intended to replace advice given to you by your health care provider. Make sure you discuss any questions you have with your health care provider. Document Released: 08/22/2007 Document Revised: 01/13/2016 Document Reviewed: 01/13/2016 Elsevier Interactive Patient Education  2017 [employer].     Studies Ordered:   Orders Placed This Encounter  Procedures  . Hemoglobin A1c  . Lipid panel  . Comprehensive metabolic panel     Glenetta Hew, M.D., M.S. Interventional Cardiologist   Pager # [phone] Phone # [phone]  [address] [place], [place] [address]   Thank you for choosing Heartcare at [facility]!!

## 2020-07-22 NOTE — Patient Instructions (Addendum)
Medication Instructions:  No changes   *If you need a refill on your cardiac medications before your next appointment, please call your pharmacy*   Lab Work: Lipid- fasting  CMP  HgA1c before next appointment May 2023   If you have labs (blood work) drawn today and your tests are completely normal, you will receive your results only by: [name] MyChart Message (if you have MyChart) OR . A paper copy in the mail If you have any lab test that is abnormal or we need to change your treatment, we will call you to review the results.   Testing/Procedures:  Not needed  Follow-Up: At [facility], you and your health needs are our priority.  As part of our continuing mission to provide you with exceptional heart care, we have created designated Provider Care Teams.  These Care Teams include your primary Cardiologist (physician) and Advanced Practice Providers (APPs -  Physician Assistants and Nurse Practitioners) who all work together to provide you with the care you need, when you need it.     Your next appointment:   12 month(s)  The format for your next appointment:   In Person  Provider:   Glenetta Hew, MD   Other Instructions Continue with diet and exercise   Think about having a Coronary Calcium Score.This is $99 out of pocket.   Coronary CalciumScan A coronary calcium scan is an imaging test used to look for deposits of calcium and other fatty materials (plaques) in the inner lining of the blood vessels of the heart (coronary arteries). These deposits of calcium and plaques can partly clog and narrow the coronary arteries without producing any symptoms or warning signs. This puts a person at risk for a heart attack. This test can detect these deposits before symptoms develop. Tell a health care provider about:  Any allergies you have.  All medicines you are taking, including vitamins, herbs, eye drops, creams, and over-the-counter medicines.  Any problems you or  family members have had with anesthetic medicines.  Any blood disorders you have.  Any surgeries you have had.  Any medical conditions you have.  Whether you are pregnant or may be pregnant. What are the risks? Generally, this is a safe procedure. However, problems may occur, including:  Harm to a pregnant woman and her unborn baby. This test involves the use of radiation. Radiation exposure can be dangerous to a pregnant woman and her unborn baby. If you are pregnant, you generally should not have this procedure done.  Slight increase in the risk of cancer. This is because of the radiation involved in the test. What happens before the procedure? No preparation is needed for this procedure. What happens during the procedure?  You will undress and remove any jewelry around your neck or chest.  You will put on a hospital gown.  Sticky electrodes will be placed on your chest. The electrodes will be connected to an electrocardiogram (ECG) machine to record a tracing of the electrical activity of your heart.  A CT scanner will take pictures of your heart. During this time, you will be asked to lie still and hold your breath for 2-3 seconds while a picture of your heart is being taken. The procedure may vary among health care providers and hospitals. What happens after the procedure?  You can get dressed.  You can return to your normal activities.  It is up to you to get the results of your test. Ask your health care provider, or the  department that is doing the test, when your results will be ready. Summary  A coronary calcium scan is an imaging test used to look for deposits of calcium and other fatty materials (plaques) in the inner lining of the blood vessels of the heart (coronary arteries).  Generally, this is a safe procedure. Tell your health care provider if you are pregnant or may be pregnant.  No preparation is needed for this procedure.  A CT scanner will take pictures  of your heart.  You can return to your normal activities after the scan is done. This information is not intended to replace advice given to you by your health care provider. Make sure you discuss any questions you have with your health care provider. Document Released: 08/22/2007 Document Revised: 01/13/2016 Document Reviewed: 01/13/2016 Elsevier Interactive Patient Education  2017 [employer].

## 2020-08-14 ENCOUNTER — Encounter: Payer: Self-pay | Admitting: Cardiology

## 2020-08-14 NOTE — Assessment & Plan Note (Signed)
Mostly related symptomatic PVCs.  Pretty well controlled on current dose of metoprolol.  With resting heart rate of 63 bpm, and controlled symptoms, would to simply continue current dose.

## 2020-08-14 NOTE — Assessment & Plan Note (Signed)
No longer on metformin because of hypoglycemia.  Current A1c is 5.8.

## 2020-08-14 NOTE — Assessment & Plan Note (Signed)
Relatively stable weight having plateaued a little bit and her weight loss.  Hopefully with continued diet and exercise change, she should build drop below the "obesity "threshold".  Current BMI is 32.3.

## 2020-08-14 NOTE — Assessment & Plan Note (Signed)
Minimal CAD by cath in the past.  No longer having any active symptoms to suggest coronary disease.  With her diabetes and hyperlipidemia, she is concerned about overall risk ratification.  Plan: Coronary Calcium Score

## 2020-08-14 NOTE — Assessment & Plan Note (Addendum)
Unfortunately, her LDL did not drop as we expected with the rosuvastatin 20 mg.  She should be due for follow-up labs before her next appointment.  If not at goal at that time, would probably increase to higher dose of rosuvastatin.   For baseline risk ratification, we will check a Coronary Calcium Score which will help determine what the LDL target should be.

## 2020-08-19 ENCOUNTER — Other Ambulatory Visit: Payer: Self-pay | Admitting: Gastroenterology

## 2020-08-21 ENCOUNTER — Other Ambulatory Visit: Payer: Self-pay | Admitting: Gastroenterology

## 2020-08-21 ENCOUNTER — Telehealth: Payer: Self-pay | Admitting: Gastroenterology

## 2020-08-21 NOTE — Telephone Encounter (Signed)
Patient calling requesting med refill for Linzess.. Plz advise thanks

## 2020-08-22 MED ORDER — LINACLOTIDE 145 MCG PO CAPS: ORAL_CAPSULE | ORAL | 1 refills | Status: DC

## 2020-08-22 NOTE — Telephone Encounter (Signed)
Sent in [place] 145 but pt needs to schedule a follow up appointment

## 2020-08-29 ENCOUNTER — Other Ambulatory Visit: Payer: Self-pay | Admitting: Cardiology

## 2020-09-16 ENCOUNTER — Other Ambulatory Visit: Payer: Self-pay

## 2020-09-16 DIAGNOSIS — E034 Atrophy of thyroid (acquired): Secondary | ICD-10-CM

## 2020-09-16 NOTE — Telephone Encounter (Signed)
MEDICATION: Euthyrox 75  PHARMACY: [facility]   Comments: requested refill by pharmacy   **Let patient know to contact pharmacy at the end of the day to make sure medication is ready. **  ** Please notify patient to allow 48-72 hours to process**  **Encourage patient to contact the pharmacy for refills or they can request refills through [facility]**

## 2020-09-17 MED ORDER — LEVOTHYROXINE SODIUM 75 MCG PO TABS: ORAL_TABLET | ORAL | 2 refills | Status: DC

## 2020-09-24 IMAGING — CR DG CHEST 1V
1 series · 1 of 1 positions shown · non-contrast
Comparison: 01/08/2017

CLINICAL DATA: Shortness of breath

EXAM:
CHEST  1 VIEW

[chest ap]
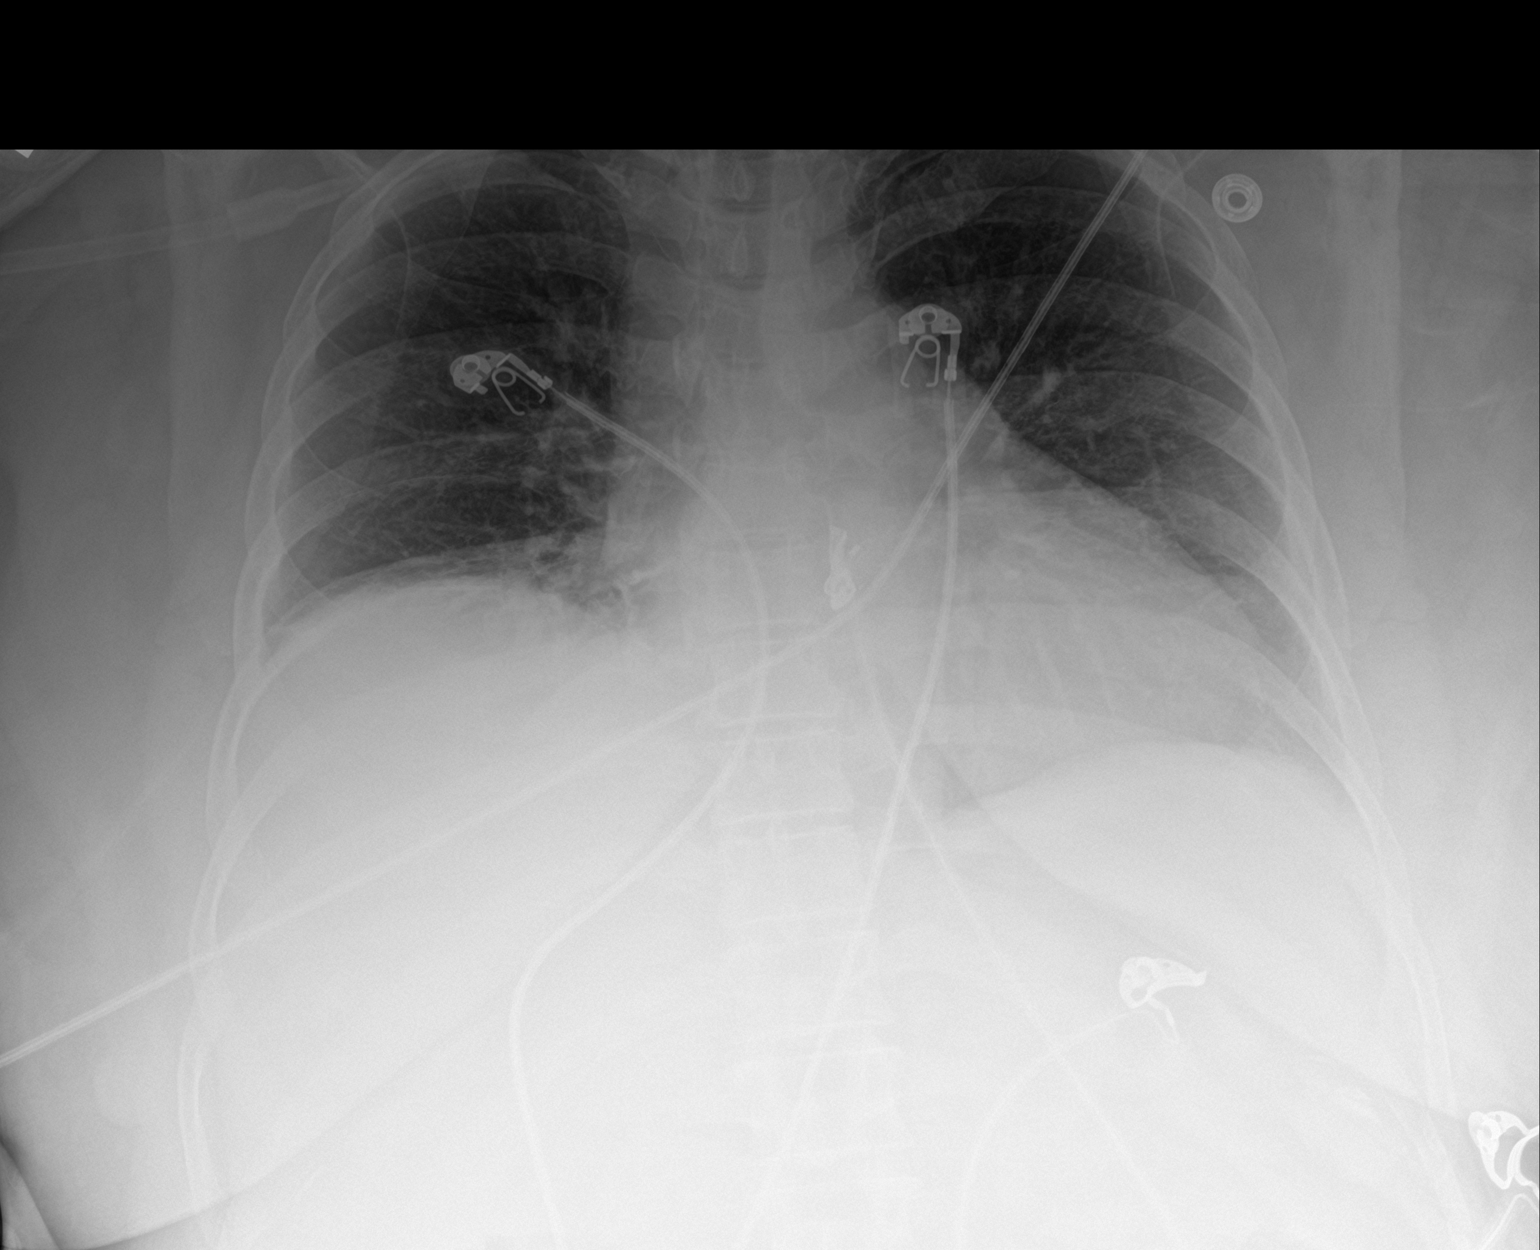

[1 of 1 positions shown; findings below may reference images not displayed]

FINDINGS: Low lung volumes with mild elevation of the right diaphragm.
Subsegmental atelectasis at the right base. Borderline heart size.
No pneumothorax.
IMPRESSION: Elevation of right diaphragm with subsegmental atelectasis at the
right base

## 2020-10-19 ENCOUNTER — Other Ambulatory Visit: Payer: Self-pay

## 2020-10-19 ENCOUNTER — Emergency Department (HOSPITAL_COMMUNITY)
Admission: EM | Admit: 2020-10-19 | Discharge: 2020-10-19 | Disposition: A | Discharge status: Home / Self Care | Payer: 59 | Attending: Emergency Medicine | Admitting: Emergency Medicine

## 2020-10-19 ENCOUNTER — Emergency Department (HOSPITAL_COMMUNITY): Payer: 59

## 2020-10-19 ENCOUNTER — Telehealth: Payer: Self-pay | Admitting: Internal Medicine

## 2020-10-19 DIAGNOSIS — E039 Hypothyroidism, unspecified: Secondary | ICD-10-CM | POA: Diagnosis not present

## 2020-10-19 DIAGNOSIS — Z79899 Other long term (current) drug therapy: Secondary | ICD-10-CM | POA: Diagnosis not present

## 2020-10-19 DIAGNOSIS — E119 Type 2 diabetes mellitus without complications: Secondary | ICD-10-CM | POA: Insufficient documentation

## 2020-10-19 DIAGNOSIS — R072 Precordial pain: Secondary | ICD-10-CM | POA: Diagnosis not present

## 2020-10-19 DIAGNOSIS — Z87891 Personal history of nicotine dependence: Secondary | ICD-10-CM | POA: Insufficient documentation

## 2020-10-19 DIAGNOSIS — I10 Essential (primary) hypertension: Secondary | ICD-10-CM | POA: Insufficient documentation

## 2020-10-19 DIAGNOSIS — Z91040 Latex allergy status: Secondary | ICD-10-CM | POA: Diagnosis not present

## 2020-10-19 DIAGNOSIS — R079 Chest pain, unspecified: Secondary | ICD-10-CM

## 2020-10-19 LAB — CBC
HCT: 45.6 % (ref 36.0–46.0)
Hemoglobin: 14.9 g/dL (ref 12.0–15.0)
MCH: 29.2 pg (ref 26.0–34.0)
MCHC: 32.7 g/dL (ref 30.0–36.0)
MCV: 89.2 fL (ref 80.0–100.0)
Platelets: 282 10*3/uL (ref 150–400)
RBC: 5.11 MIL/uL (ref 3.87–5.11)
RDW: 13.2 % (ref 11.5–15.5)
WBC: 10.2 10*3/uL (ref 4.0–10.5)
nRBC: 0 % (ref 0.0–0.2)

## 2020-10-19 LAB — BASIC METABOLIC PANEL
Anion gap: 8 (ref 5–15)
BUN: 20 mg/dL (ref 6–20)
CO2: 24 mmol/L (ref 22–32)
Calcium: 9.6 mg/dL (ref 8.9–10.3)
Chloride: 105 mmol/L (ref 98–111)
Creatinine, Ser: 0.63 mg/dL (ref 0.44–1.00)
GFR, Estimated: >60 mL/min (ref >60)
Glucose, Bld: 109 mg/dL — ABNORMAL HIGH (ref 70–99)
Potassium: 3.9 mmol/L (ref 3.5–5.1)
Sodium: 137 mmol/L (ref 135–145)

## 2020-10-19 LAB — I-STAT BETA HCG BLOOD, ED (MC, WL, AP ONLY): I-stat hCG, quantitative: <5 m[IU]/mL (ref <5)

## 2020-10-19 LAB — TROPONIN I (HIGH SENSITIVITY)
Troponin I (High Sensitivity): 3 ng/L (ref <18)
Troponin I (High Sensitivity): <2 ng/L (ref <18)

## 2020-10-19 MED ORDER — ALUM & MAG HYDROXIDE-SIMETH 200-200-20 MG/5ML PO SUSP
30.0000 mL | Freq: Once | ORAL | Status: AC
  Administered 2020-10-19: 30 mL via ORAL
  Filled 2020-10-19: qty 30

## 2020-10-19 NOTE — ED Triage Notes (Signed)
Pt states she woke up this am and "felt like someone was sticking her in center of chest", is now feeling discomfort in between shoulder blades that radiates down back. States that she feels nauseated, feels like there is a knot in stomach and throat. Also c/o SOB and restlessness at this time.

## 2020-10-19 NOTE — ED Notes (Signed)
Transported to radiology 

## 2020-10-19 NOTE — Telephone Encounter (Signed)
Patient called about episode of severe chest pain with radiation into her neck awakening her from sleep early in the morning.  Patient recommended to come to the ER for further evaluation.

## 2020-10-19 NOTE — ED Provider Notes (Signed)
Care of patient assumed from Dr. Leonette Monarch at 7:30 AM.  Patient has had intermittent atypical chest pain.  Currently undergoing ACS work-up.  Awaiting delta troponin.  Will likely go home with follow-up. Physical Exam  BP 107/78   Pulse 63   Temp 97.8 F (36.6 C) (Oral)   Resp 17   Ht 5' 2"  (1.575 m)   Wt 77.1 kg   LMP 08/12/2015 Comment: patient states hx. tubal ligation  SpO2 98%   BMI 31.09 kg/m   Physical Exam Constitutional:      General: She is not in acute distress.    Appearance: Normal appearance. She is not ill-appearing, toxic-appearing or diaphoretic.  Cardiovascular:     Rate and Rhythm: Normal rate and regular rhythm.  Pulmonary:     Effort: Pulmonary effort is normal.  Neurological:     Mental Status: She is alert.  Psychiatric:        Mood and Affect: Mood normal.        Behavior: Behavior normal.        Thought Content: Thought content normal.        Judgment: Judgment normal.    ED Course/Procedures     Procedures  MDM  Delta troponin was normal.  Patient states that she does see a cardiologist.  She was advised to follow-up with cardiologist for possible further testing.  She was discharged in good condition.       Godfrey Pick, MD 10/19/20 [phone]

## 2020-10-19 NOTE — ED Notes (Signed)
Provider at bedside

## 2020-10-19 NOTE — ED Provider Notes (Signed)
[facility] EMERGENCY DEPARTMENT Provider Note  CSN: [id number] Arrival date & time: 10/19/20 0445  Chief Complaint(s) No chief complaint on file.  HPI Abigail [name] is a 55 y.o. female   The history is provided by the patient.  Chest Pain Pain location:  Substernal area Pain quality: sharp and stabbing   Pain radiates to:  Neck and mid back Pain severity:  Moderate Duration:  2 days Timing:  Intermittent Progression:  Waxing and waning Chronicity:  New Relieved by: self resolving. Worsened by:  Nothing Risk factors: high cholesterol and hypertension    Past Medical History Past Medical History:  Diagnosis Date   Anxiety    Bulging lumbar disc    CAP (community acquired pneumonia)    Cardiac arrhythmia due to congenital heart disease    no arrhythmia identified    Celiac disease    Depression    Diverticulosis    Sigmoid colon   Fatty liver 05/2016   Frequent headaches    GERD (gastroesophageal reflux disease)    Heart disease    History of kidney stones    Hypertension    Kidney stones    Migraine    Obesity (BMI 35.0-39.9 without comorbidity)    UTI (urinary tract infection)    Patient Active Problem List   Diagnosis Date Noted   Incomplete bladder emptying 07/19/2020   Preventative health care 05/29/2020   Flank pain 05/29/2020   Overactive bladder 05/29/2020   Chronic constipation 05/29/2020   Vitamin D deficiency 05/29/2020   Frequent headaches 07/04/2019   Right upper quadrant pain 11/29/2018   Type 2 diabetes mellitus without complication, without long-term current use of insulin (HCC) 11/29/2018   CAP (community acquired pneumonia) 12/30/2017   Hyperlipidemia associated with type 2 diabetes mellitus (HCC) 08/05/2017   PVC's (premature ventricular contractions) 06/23/2017   Hypothyroidism 09/17/2016   Allergic rhinitis 06/06/2015   Palpitations 04/11/2015   Neck pain 06/29/2014   Chronic back pain 06/29/2014    Esophageal reflux 06/29/2014   Moderate obesity 05/23/2013   Normal coronary arteries- 2012 05/23/2013   Suspected sleep apnea 05/23/2013   Anxiety and depression 05/23/2013   History of medication noncompliance 05/23/2013   Home Medication(s) Prior to Admission medications   Medication Sig Start Date End Date Taking? Authorizing Provider  Accu-Chek FastClix Lancets MISC USE 1 LANCET TO CHECK BLOOD SUGAR THREE TIMES DAILY 10/03/19   Clark, Katherine K, NP  albuterol (PROAIR HFA) 108 (90 Base) MCG/ACT inhaler Inhale 2 puffs into the lungs every 6 (six) hours as needed for wheezing or shortness of breath. 04/03/20   Copland, Spencer, MD  Blood Glucose Monitoring Suppl (ACCU-CHEK GUIDE) w/Device KIT by Does not apply route.    [provider]  Cholecalciferol (VITAMIN D3) 1.25 MG (50000 UT) TABS Take 1 tablet by mouth every 7 (seven) days. 05/29/20   Clark, Katherine K, NP  esomeprazole (NEXIUM) 40 MG capsule TAKE 1 CAPSULE BY MOUTH DAILY 02/24/16   Clark, Katherine K, NP  FLUoxetine (PROZAC) 40 MG capsule Take 1 capsule (40 mg total) by mouth daily. For anxiety 06/30/20   Clark, Katherine K, NP  glucose blood (ACCU-CHEK GUIDE) test strip USE 1 STRIP   THREE TIMES DAILY 07/19/20   Clark, Katherine K, NP  HYDROcodone-acetaminophen (NORCO/VICODIN) 5-325 MG tablet Take 1 tablet by mouth every 6 (six) hours as needed. for pain 10/30/19   [provider]  hydrOXYzine (ATARAX/VISTARIL) 10 MG tablet Take 1 to 2 tablets by mouth twice   daily as needed for anxiety. 07/04/19   Clark, Katherine K, NP  Lancets Misc. (ACCU-CHEK FASTCLIX LANCET) KIT Use as directed to test blood sugar daily 10/03/19   Clark, Katherine K, NP  levothyroxine (EUTHYROX) 75 MCG tablet TAKE 1 TABLET BY MOUTH IN THE MORNING ON  AN  EMPTY  STOMACH  WITH  WATER  &  NO  FOOD  OR  OTHER  MEDICATIONS  FOR  30  MINUTES 09/17/20   Clark, Katherine K, NP  linaclotide (LINZESS) 145 MCG CAPS capsule TAKE 1 CAPSULE BY MOUTH ONCE DAILY  BEFORE BREAKFAST 08/22/20   Nandigam, Kavitha V, MD  methocarbamol (ROBAXIN) 500 MG tablet Take 500 mg by mouth 3 (three) times daily.    [provider]  metoprolol tartrate (LOPRESSOR) 25 MG tablet Take 1 tablet by mouth twice daily 04/15/20   Harding, David W, MD  ondansetron (ZOFRAN ODT) 8 MG disintegrating tablet Take 1 tablet (8 mg total) by mouth every 8 (eight) hours as needed for nausea or vomiting. 12/20/18   Copland, Spencer, MD  ondansetron (ZOFRAN) 4 MG tablet Take 4 mg by mouth every 6 (six) hours as needed. 06/10/20   [provider]  oxybutynin (DITROPAN-XL) 5 MG 24 hr tablet Take 5 mg by mouth daily. 12/03/17   [provider]  Peppermint Oil (IBGARD PO) Take by mouth.    [provider]  rosuvastatin (CRESTOR) 20 MG tablet Take 1 tablet (20 mg total) by mouth daily. 08/29/20   Harding, David W, MD  tamsulosin (FLOMAX) 0.4 MG CAPS capsule Take 0.4 mg by mouth at bedtime. 05/27/20   [provider]  traMADol (ULTRAM) 50 MG tablet Take 50 mg by mouth every morning. 08/12/16   [provider]                                                                                                                                    Past Surgical History Past Surgical History:  Procedure Laterality Date   ABDOMINAL EXPLORATION SURGERY  1986   COLONOSCOPY  06/2016   ESOPHAGEAL MANOMETRY N/A 12/06/2019   Procedure: ESOPHAGEAL MANOMETRY (EM);  Surgeon: Nandigam, Kavitha V, MD;  Location: [facility];  Service: Endoscopy;  Laterality: N/A;   EXTRACORPOREAL SHOCK WAVE LITHOTRIPSY Left 12/03/2016   Procedure: LEFT EXTRACORPOREAL SHOCK WAVE LITHOTRIPSY (ESWL);  Surgeon: Wrenn, John, MD;  Location: [facility];  Service: Urology;  Laterality: Left;   KIDNEY STONE SURGERY     LEFT HEART CATH AND CORONARY ANGIOGRAPHY  07/28/2016    Large tortuous coronary arteries no radiographic evidence of disease.  Not anginal chest pain.  Normal EF.   LITHOTRIPSY     NASAL  SINUS SURGERY     ROTATOR CUFF REPAIR Right 12/27/2017   TRANSTHORACIC ECHOCARDIOGRAM  06/2017   EF 60-65%.  GR 1-2 DD.  Otherwise normal echo.  Normal valve function.  Normal wall motion.   TUBAL LIGATION       Family History Family History  Problem Relation Age of Onset   COPD Father    Emphysema Father    Gallstones Mother    Colon cancer Neg Hx    Stomach cancer Neg Hx    Esophageal cancer Neg Hx     Social History Social History   Tobacco Use   Smoking status: Former    Types: Cigarettes    Quit date: 2008    Years since quitting: 14.6   Smokeless tobacco: Never   Tobacco comments:    used tobacco "socially"  Scientific laboratory technician Use: Never used  Substance Use Topics   Alcohol use: No    Alcohol/week: 0.0 standard drinks   Drug use: No   Allergies Penicillins and Latex  Review of Systems Review of Systems  Cardiovascular:  Positive for chest pain.  All other systems are reviewed and are negative for acute change except as noted in the HPI  Physical Exam Vital Signs  I have reviewed the triage vital signs BP 107/78   Pulse 63   Temp 97.8 F (36.6 C) (Oral)   Resp 17   Ht 5' 2" (1.575 m)   Wt 77.1 kg   LMP 08/12/2015 Comment: patient states hx. tubal ligation  SpO2 98%   BMI 31.09 kg/m   Physical Exam Vitals reviewed.  Constitutional:      General: She is not in acute distress.    Appearance: She is well-developed. She is not diaphoretic.  HENT:     Head: Normocephalic and atraumatic.     Nose: Nose normal.  Eyes:     General: No scleral icterus.       Right eye: No discharge.        Left eye: No discharge.     Conjunctiva/sclera: Conjunctivae normal.     Pupils: Pupils are equal, round, and reactive to light.  Cardiovascular:     Rate and Rhythm: Normal rate and regular rhythm.     Heart sounds: No murmur heard.   No friction rub. No gallop.  Pulmonary:     Effort: Pulmonary effort is normal. No respiratory distress.     Breath sounds:  Normal breath sounds. No stridor. No rales.  Abdominal:     General: There is no distension.     Palpations: Abdomen is soft.     Tenderness: There is no abdominal tenderness.  Musculoskeletal:        General: No tenderness.     Cervical back: Normal range of motion and neck supple.  Skin:    General: Skin is warm and dry.     Findings: No erythema or rash.  Neurological:     Mental Status: She is alert and oriented to person, place, and time.    ED Results and Treatments Labs (all labs ordered are listed, but only abnormal results are displayed) Labs Reviewed  BASIC METABOLIC PANEL - Abnormal; Notable for the following components:      Result Value   Glucose, Bld 109 (*)    All other components within normal limits  CBC  I-STAT BETA HCG BLOOD, ED ([facility], [facility], [facility] ONLY)  TROPONIN I (HIGH SENSITIVITY)  TROPONIN I (HIGH SENSITIVITY)  EKG  EKG Interpretation  Date/Time:  Saturday October 19 2020 04:51:30 EDT Ventricular Rate:  66 PR Interval:  150 QRS Duration: 64 QT Interval:  424 QTC Calculation: 444 R Axis:   2 Text Interpretation: Normal sinus rhythm Possible Anterior infarct , age undetermined Abnormal ECG No significant change since last tracing Confirmed by ,  ([id number]) on 10/19/2020 6:10:17 AM       Radiology DG Chest 2 View  Result Date: 10/19/2020 CLINICAL DATA:  Discomfort between the shoulder blades. EXAM: CHEST - 2 VIEW COMPARISON:  Jul 28, 2019 FINDINGS: The heart size and mediastinal contours are within normal limits. Both lungs are clear. Degenerative changes are noted within the mid and lower thoracic spine. IMPRESSION: Stable exam without active cardiopulmonary disease. Electronically Signed   By: Thaddeus  Houston M.D.   On: 10/19/2020 06:14    Pertinent labs & imaging results that were available during my care of the patient were  reviewed by me and considered in my medical decision making (see MDM for details).  Medications Ordered in ED Medications  alum & mag hydroxide-simeth (MAALOX/MYLANTA) 200-200-20 MG/5ML suspension 30 mL (30 mLs Oral Given 10/19/20 0647)                                                                                                                                     Procedures Procedures  (including critical care time)  Medical Decision Making / ED Course I have reviewed the nursing notes for this encounter and the patient's prior records (if available in EHR or on provided paperwork).  Jalon A Mccaslin was evaluated in Emergency Department on 10/19/2020 for the symptoms described in the history of present illness. She was evaluated in the context of the global COVID-19 pandemic, which necessitated consideration that the patient might be at risk for infection with the SARS-CoV-2 virus that causes COVID-19. Institutional protocols and algorithms that pertain to the evaluation of patients at risk for COVID-19 are in a state of rapid change based on information released by regulatory bodies including the CDC and federal and state organizations. These policies and algorithms were followed during the patient's care in the ED.     Atypical chest pain highly inconsistent with ACS. EKG without acute ischemic changes or evidence of pericarditis. Heart score less than 3. Initial troponin negative. Appropriate for delta Trope.  If negative patient is cleared for discharge.  Low suspicion for pulmonary embolism. Presentation not classic pleuritic dissection or esophageal perforation. Chest x-ray without evidence suggestive of pneumonia, pneumothorax, pneumomediastinum.  No abnormal contour of the mediastinum to suggest dissection. No evidence of acute injuries.  Pertinent labs & imaging results that were available during my care of the patient were reviewed by me and considered in my medical  decision making:  Patient care turned over to Dr Dixon. Patient case and results discussed in detail; please see their note for further ED managment.       Final Clinical Impression(s) / ED Diagnoses Final diagnoses:  Intermittent chest pain     This chart was dictated using voice recognition software.  Despite best efforts to proofread,  errors can occur which can change the documentation meaning.    ,  Eduardo, MD 10/19/20 0758  

## 2020-10-19 NOTE — Discharge Instructions (Addendum)
Schedule follow-up appoint with your cardiologist to discuss stress testing.  Return to the ED for any new or worsening symptoms.

## 2020-10-26 ENCOUNTER — Other Ambulatory Visit: Payer: Self-pay | Admitting: Primary Care

## 2020-10-26 DIAGNOSIS — E119 Type 2 diabetes mellitus without complications: Secondary | ICD-10-CM

## 2020-11-04 ENCOUNTER — Other Ambulatory Visit (INDEPENDENT_AMBULATORY_CARE_PROVIDER_SITE_OTHER): Payer: 59

## 2020-11-04 ENCOUNTER — Other Ambulatory Visit: Payer: Self-pay

## 2020-11-04 DIAGNOSIS — E119 Type 2 diabetes mellitus without complications: Secondary | ICD-10-CM

## 2020-11-04 LAB — POCT GLYCOSYLATED HEMOGLOBIN (HGB A1C): Hemoglobin A1C: 5.5 % (ref 4.0–5.6)

## 2020-11-18 ENCOUNTER — Other Ambulatory Visit: Payer: Self-pay | Admitting: Primary Care

## 2020-11-18 DIAGNOSIS — E559 Vitamin D deficiency, unspecified: Secondary | ICD-10-CM

## 2020-11-22 ENCOUNTER — Ambulatory Visit: Payer: 59 | Admitting: Primary Care

## 2020-11-22 ENCOUNTER — Other Ambulatory Visit: Payer: Self-pay

## 2020-11-22 ENCOUNTER — Encounter: Payer: Self-pay | Admitting: Primary Care

## 2020-11-22 VITALS — BP 110/72 | HR 79 | Temp 97.0°F | Ht 62.0 in | Wt 173.0 lb

## 2020-11-22 DIAGNOSIS — E119 Type 2 diabetes mellitus without complications: Secondary | ICD-10-CM

## 2020-11-22 DIAGNOSIS — E559 Vitamin D deficiency, unspecified: Secondary | ICD-10-CM | POA: Diagnosis not present

## 2020-11-22 DIAGNOSIS — F419 Anxiety disorder, unspecified: Secondary | ICD-10-CM

## 2020-11-22 DIAGNOSIS — R29818 Other symptoms and signs involving the nervous system: Secondary | ICD-10-CM

## 2020-11-22 DIAGNOSIS — R5382 Chronic fatigue, unspecified: Secondary | ICD-10-CM

## 2020-11-22 DIAGNOSIS — F32A Depression, unspecified: Secondary | ICD-10-CM

## 2020-11-22 DIAGNOSIS — R5383 Other fatigue: Secondary | ICD-10-CM | POA: Insufficient documentation

## 2020-11-22 MED ORDER — FLUOXETINE HCL 10 MG PO CAPS: 10.0000 mg | ORAL_CAPSULE | Freq: Every day | ORAL | 1 refills | Status: DC

## 2020-11-22 NOTE — Assessment & Plan Note (Signed)
Well controlled until a few months ago.  Compliant to fluoxetine 40 mg. Suspect that part of her symptoms are secondary to undiagnosed sleep apnea.   Will increase fluoxetine to 50 mg by adding 10 mg to her 40 mg dose. Will also refer to therapy. Referral placed.  She will also be sent for sleep study.

## 2020-11-22 NOTE — Assessment & Plan Note (Signed)
Compliant to vitamin D 50,000 IU daily, continue same.  Labs reviewed from March 2022.

## 2020-11-22 NOTE — Assessment & Plan Note (Signed)
Patient never went for sleep study, agrees now. Suspect her fatigue is largely contributed to undiagnosed sleep apnea.  Referral placed go pulmonology.

## 2020-11-22 NOTE — Progress Notes (Signed)
Subjective:    Patient ID: Abigail [name], female    DOB: 1965-06-06, 55 y.o.   MRN: [id number]  HPI  Abigail [name] is a very pleasant 55 y.o. female with a history of hypothyroidism, type 2 diabetes, hyperlipidemia, overactive bladder, anxiety and depression, vitamin D deficiency, chronic back pain who presents today for follow up of diabetes and to discuss chronic fatigue.   1) Type 2 Diabetes:  Current medications include: None. Previously on metformin, but she stopped taking one year ago.   She is checking her blood glucose several times weekly and is getting readings of 80-120's.   Last A1C: 5.5 in August 2022 Last Eye Exam: UTD Last Foot Exam: Due Pneumonia Vaccination: UTD Urine Microalbumin: UTD Statin: rosuvastatin   2) Chronic Fatigue: Daily. She is compliant to her vitamin D 50, 000 IU once weekly. She has to force herself to stay awake, especially at work. She felt so tired yesterday she had to call out of work. She typically falls asleep at 9 pm, wakes at 2-3 am to urinate, alarm is set for 5:45 am daily, will sometimes fall back asleep until 9-10 am.   She is now working full time, this began about one month ago.   She has been referred for sleep study, but she never went. She does snore, wakes to use the bathroom during the night, wakes during the night for no reason.   She is compliant to her levothyroxine 75 mcg daily.   She does feel some depression, is compliant to fluoxetine 40 mg, feels like it's not as effective. Symptoms include little motivation, sleeping a lot, not wanting to do anything, feeling down, fatigue.    BP Readings from Last 3 Encounters:  11/22/20 110/72  10/19/20 (!) 100/57  07/22/20 112/66       Review of Systems  Respiratory:  Negative for shortness of breath.   Cardiovascular:  Negative for chest pain.  Neurological:  Negative for dizziness, numbness and headaches.  Psychiatric/Behavioral:  Positive for sleep  disturbance. The patient is not nervous/anxious.        See HPI        Past Medical History:  Diagnosis Date   Anxiety    Bulging lumbar disc    CAP (community acquired pneumonia)    Cardiac arrhythmia due to congenital heart disease    no arrhythmia identified    Celiac disease    Depression    Diverticulosis    Sigmoid colon   Fatty liver 05/2016   Frequent headaches    GERD (gastroesophageal reflux disease)    Heart disease    History of kidney stones    Hypertension    Kidney stones    Migraine    Obesity (BMI 35.0-39.9 without comorbidity)    UTI (urinary tract infection)     Social History   Socioeconomic History   Marital status: Single    Spouse name: Not on file   Number of children: Not on file   Years of education: Not on file   Highest education level: Not on file  Occupational History   Not on file  Tobacco Use   Smoking status: Former    Types: Cigarettes    Quit date: 2008    Years since quitting: 14.7   Smokeless tobacco: Never   Tobacco comments:    used tobacco "socially"  Scientific laboratory technician Use: Never used  Substance and Sexual Activity   Alcohol use: No  Alcohol/week: 0.0 standard drinks   Drug use: No   Sexual activity: Not on file  Other Topics Concern   Not on file  Social History Narrative   Single.   Has a set of Twins, age 49.   Works for the [employer] and [employer] and [employer]   Enjoys relaxing, spending time with her children.   Social Determinants of Health   Financial Resource Strain: Not on file  Food Insecurity: Not on file  Transportation Needs: Not on file  Physical Activity: Not on file  Stress: Not on file  Social Connections: Not on file  Intimate Partner Violence: Not on file    Past Surgical History:  Procedure Laterality Date   [place]   COLONOSCOPY  06/2016   ESOPHAGEAL MANOMETRY N/A 12/06/2019   Procedure: ESOPHAGEAL MANOMETRY (EM);  Surgeon: Mauri Pole, MD;  Location: [facility];  Service: Endoscopy;  Laterality: N/A;   EXTRACORPOREAL SHOCK WAVE LITHOTRIPSY Left 12/03/2016   Procedure: LEFT EXTRACORPOREAL SHOCK WAVE LITHOTRIPSY (ESWL);  Surgeon: Irine Seal, MD;  Location: [facility];  Service: Urology;  Laterality: Left;   KIDNEY STONE SURGERY     LEFT HEART CATH AND CORONARY ANGIOGRAPHY  07/28/2016    Large tortuous coronary arteries no radiographic evidence of disease.  Not anginal chest pain.  Normal EF.   LITHOTRIPSY     NASAL SINUS SURGERY     ROTATOR CUFF REPAIR Right 12/27/2017   TRANSTHORACIC ECHOCARDIOGRAM  06/2017   EF 60-65%.  GR 1-2 DD.  Otherwise normal echo.  Normal valve function.  Normal wall motion.   TUBAL LIGATION      Family History  Problem Relation Age of Onset   COPD Father    Emphysema Father    Gallstones Mother    Colon cancer Neg Hx    Stomach cancer Neg Hx    Esophageal cancer Neg Hx     Allergies  Allergen Reactions   Penicillins Nausea Only    Has patient had a PCN reaction causing immediate rash, facial/tongue/throat swelling, SOB or lightheadedness with hypotension: No Has patient had a PCN reaction causing severe rash involving mucus membranes or skin necrosis: No Has patient had a PCN reaction that required hospitalization: No Has patient had a PCN reaction occurring within the last 10 years: No If all of the above answers are "NO", then may proceed with Cephalosporin use.    Latex Rash    Current Outpatient Medications on File Prior to Visit  Medication Sig Dispense Refill   Accu-Chek FastClix Lancets MISC USE 1 LANCET TO CHECK BLOOD SUGAR THREE TIMES DAILY 102 each 5   albuterol (PROAIR HFA) 108 (90 Base) MCG/ACT inhaler Inhale 2 puffs into the lungs every 6 (six) hours as needed for wheezing or shortness of breath. 8 g 0   Blood Glucose Monitoring Suppl (ACCU-CHEK GUIDE) w/Device KIT by Does not apply route.     Cholecalciferol (VITAMIN D3) 1.25 MG (50000 UT) CAPS Take 1 capsule  by mouth once a week 12 capsule 0   esomeprazole (NEXIUM) 40 MG capsule TAKE 1 CAPSULE BY MOUTH DAILY 90 capsule 1   FLUoxetine (PROZAC) 40 MG capsule Take 1 capsule (40 mg total) by mouth daily. For anxiety 90 capsule 3   glucose blood (ACCU-CHEK GUIDE) test strip USE 1 STRIP   THREE TIMES DAILY 100 each 1   HYDROcodone-acetaminophen (NORCO/VICODIN) 5-325 MG tablet Take 1 tablet by mouth every 6 (six) hours as needed.  for pain     hydrOXYzine (ATARAX/VISTARIL) 10 MG tablet Take 1 to 2 tablets by mouth twice daily as needed for anxiety. 30 tablet 0   Lancets Misc. (ACCU-CHEK FASTCLIX LANCET) KIT Use as directed to test blood sugar daily 1 kit 0   levothyroxine (EUTHYROX) 75 MCG tablet TAKE 1 TABLET BY MOUTH IN THE MORNING ON  AN  EMPTY  STOMACH  WITH  WATER  &  NO  FOOD  OR  OTHER  MEDICATIONS  FOR  30  MINUTES 90 tablet 2   linaclotide (LINZESS) 145 MCG CAPS capsule TAKE 1 CAPSULE BY MOUTH ONCE DAILY BEFORE BREAKFAST 30 capsule 1   methocarbamol (ROBAXIN) 500 MG tablet Take 500 mg by mouth 3 (three) times daily.     metoprolol tartrate (LOPRESSOR) 25 MG tablet Take 1 tablet by mouth twice daily 60 tablet 10   ondansetron (ZOFRAN ODT) 8 MG disintegrating tablet Take 1 tablet (8 mg total) by mouth every 8 (eight) hours as needed for nausea or vomiting. 20 tablet 1   ondansetron (ZOFRAN) 4 MG tablet Take 4 mg by mouth every 6 (six) hours as needed.     oxybutynin (DITROPAN-XL) 5 MG 24 hr tablet Take 5 mg by mouth daily.  1   Peppermint Oil (IBGARD PO) Take by mouth.     rosuvastatin (CRESTOR) 20 MG tablet Take 1 tablet (20 mg total) by mouth daily. 90 tablet 3   tamsulosin (FLOMAX) 0.4 MG CAPS capsule Take 0.4 mg by mouth at bedtime.     traMADol (ULTRAM) 50 MG tablet Take 50 mg by mouth every morning.     No current facility-administered medications on file prior to visit.    BP 110/72 (BP Location: Left Arm, Patient Position: Sitting, Cuff Size: Normal)   Pulse 79   Temp (!) 97 F (36.1 C)  (Temporal)   Ht _0  (1.575 m)   Wt 173 lb (78.5 kg)   LMP 08/12/2015 Comment: patient states hx. tubal ligation  SpO2 92%   BMI 31.64 kg/m  Objective:   Physical Exam Cardiovascular:     Rate and Rhythm: Normal rate and regular rhythm.  Pulmonary:     Effort: Pulmonary effort is normal.     Breath sounds: Normal breath sounds.  Musculoskeletal:     Cervical back: Neck supple.  Skin:    General: Skin is warm and dry.  Psychiatric:        Mood and Affect: Mood normal.          Assessment & Plan:      This visit occurred during the SARS-CoV-2 public health emergency.  Safety protocols were in place, including screening questions prior to the visit, additional usage of staff PPE, and extensive cleaning of exam room while observing appropriate contact time as indicated for disinfecting solutions.

## 2020-11-22 NOTE — Patient Instructions (Signed)
We added fluoxetine 10 mg to your regimen. Take this with your 40 mg dose of fluoxetine for anxiety and depression.  You will be contacted regarding your referral to pulmonology for the sleep study and by therapy.  Please let us know if you have not been contacted within two weeks.   Please schedule a physical to meet with me in March.  It was a pleasure to see you today!

## 2020-11-22 NOTE — Assessment & Plan Note (Signed)
Highly suspect undiagnosed sleep apnea to be contributing, she agrees to sleep study referral. Referral placed.  Labs from March 2022 reviewed. Compliant to all prescribed medications.  Will work to treat depression.

## 2020-11-22 NOTE — Assessment & Plan Note (Signed)
Well controlled from A1C in August 2022 of 5.5. Continue off metformin.  Commended her on weight loss! Follow up in March 2023.

## 2020-11-29 ENCOUNTER — Ambulatory Visit: Payer: 59 | Admitting: Gastroenterology

## 2020-12-13 ENCOUNTER — Encounter: Payer: Self-pay | Admitting: Primary Care

## 2020-12-31 ENCOUNTER — Ambulatory Visit: Payer: 59 | Admitting: Gastroenterology

## 2021-01-20 ENCOUNTER — Encounter: Payer: Self-pay | Admitting: Gastroenterology

## 2021-01-20 ENCOUNTER — Other Ambulatory Visit (INDEPENDENT_AMBULATORY_CARE_PROVIDER_SITE_OTHER): Payer: 59

## 2021-01-20 ENCOUNTER — Ambulatory Visit (INDEPENDENT_AMBULATORY_CARE_PROVIDER_SITE_OTHER): Payer: 59 | Admitting: Gastroenterology

## 2021-01-20 VITALS — BP 106/68 | HR 72 | Ht 62.0 in | Wt 179.4 lb

## 2021-01-20 DIAGNOSIS — K760 Fatty (change of) liver, not elsewhere classified: Secondary | ICD-10-CM

## 2021-01-20 DIAGNOSIS — R7989 Other specified abnormal findings of blood chemistry: Secondary | ICD-10-CM | POA: Diagnosis not present

## 2021-01-20 DIAGNOSIS — K219 Gastro-esophageal reflux disease without esophagitis: Secondary | ICD-10-CM | POA: Diagnosis not present

## 2021-01-20 DIAGNOSIS — K5904 Chronic idiopathic constipation: Secondary | ICD-10-CM | POA: Diagnosis not present

## 2021-01-20 LAB — HEPATIC FUNCTION PANEL
ALT: 23 U/L (ref 0–35)
AST: 18 U/L (ref 0–37)
Albumin: 4.2 g/dL (ref 3.5–5.2)
Alkaline Phosphatase: 99 U/L (ref 39–117)
Bilirubin, Direct: 0.2 mg/dL (ref 0.0–0.3)
Total Bilirubin: 1.3 mg/dL — ABNORMAL HIGH (ref 0.2–1.2)
Total Protein: 6.9 g/dL (ref 6.0–8.3)

## 2021-01-20 MED ORDER — LINACLOTIDE 145 MCG PO CAPS: ORAL_CAPSULE | ORAL | 3 refills | Status: DC

## 2021-01-20 NOTE — Patient Instructions (Signed)
Your provider has requested that you go to the basement level for lab work before leaving today. Press "B" on the elevator. The lab is located at the first door on the left as you exit the elevator.   We will refill your Linzess 145 mcg today  Follow up in 1 year   Due to recent changes in healthcare laws, you may see the results of your imaging and laboratory studies on MyChart before your provider has had a chance to review them.  We understand that in some cases there may be results that are confusing or concerning to you. Not all laboratory results come back in the same time frame and the provider may be waiting for multiple results in order to interpret others.  Please give [place] 48 hours in order for your provider to thoroughly review all the results before contacting the office for clarification of your results.    If you are age 68 or older, your body mass index should be between 23-30. Your Body mass index is 32.81 kg/m. If this is out of the aforementioned range listed, please consider follow up with your Primary Care Provider.  If you are age 67 or younger, your body mass index should be between 19-25. Your Body mass index is 32.81 kg/m. If this is out of the aformentioned range listed, please consider follow up with your Primary Care Provider.   ________________________________________________________  The [facility] providers would like to encourage you to use [facility] to communicate with providers for non-urgent requests or questions.  Due to long hold times on the telephone, sending your provider a message by [facility] may be a faster and more efficient way to get a response.  Please allow 48 business hours for a response.  Please remember that this is for non-urgent requests.  _______________________________________________________   I appreciate the  opportunity to care for you  Thank You   Harl Bowie , MD

## 2021-01-20 NOTE — Progress Notes (Signed)
Abigail [name]    [id number]    June 28, 1965  Primary Care Physician:Clark, Leticia Penna, NP  Referring Physician: [name], NP [place],  [place] [id number]    Chief complaint:  Constipation  HPI:  55 year old female with history of diabetes, obesity, fatty liver here for follow-up visit for chronic constipation.  Overall her bowel habits have improved with Linzess.  She is having regular bowel movement with Linzess 145 mcg daily  No vomiting, melena or blood per rectum. She has lost significant amount of weight, feels much better.  She is not having any significant reflux related issues since she lost weight.   Colonoscopy June 30, 2016 with removal of 3 mm sessile serrated adenomatous polyp from cecum, sigmoid diverticulosis and internal hemorrhoids.   EGD June 30, 2016: Small hiatal hernia otherwise normal exam.  Duodenal biopsies showed benign mucosa negative for celiac.   Right upper quadrant abdominal ultrasound December 31, 2017: Showed chronic hepatic steatosis otherwise no acute abnormality.   CT renal study July 01, 2018: Punctate 1 to 2 mm right UVJ stone and mild fullness in the right renal collecting system.  Bilateral punctate nephrolithiasis.  Fatty liver.   Outpatient Encounter Medications as of 01/20/2021  Medication Sig   Accu-Chek FastClix Lancets MISC USE 1 LANCET TO CHECK BLOOD SUGAR THREE TIMES DAILY   albuterol (PROAIR HFA) 108 (90 Base) MCG/ACT inhaler Inhale 2 puffs into the lungs every 6 (six) hours as needed for wheezing or shortness of breath.   Blood Glucose Monitoring Suppl (ACCU-CHEK GUIDE) w/Device KIT by Does not apply route.   Cholecalciferol (VITAMIN D3) 1.25 MG (50000 UT) CAPS Take 1 capsule by mouth once a week   esomeprazole (NEXIUM) 40 MG capsule TAKE 1 CAPSULE BY MOUTH DAILY   FLUoxetine (PROZAC) 10 MG capsule Take 1 capsule (10 mg total) by mouth daily. For depression and anxiety. Take with 40 mg  dose.   FLUoxetine (PROZAC) 40 MG capsule Take 1 capsule (40 mg total) by mouth daily. For anxiety   glucose blood (ACCU-CHEK GUIDE) test strip USE 1 STRIP   THREE TIMES DAILY   HYDROcodone-acetaminophen (NORCO/VICODIN) 5-325 MG tablet Take 1 tablet by mouth every 6 (six) hours as needed. for pain   hydrOXYzine (ATARAX/VISTARIL) 10 MG tablet Take 1 to 2 tablets by mouth twice daily as needed for anxiety.   Lancets Misc. (ACCU-CHEK FASTCLIX LANCET) KIT Use as directed to test blood sugar daily   levothyroxine (EUTHYROX) 75 MCG tablet TAKE 1 TABLET BY MOUTH IN THE MORNING ON  AN  EMPTY  STOMACH  WITH  WATER  &  NO  FOOD  OR  OTHER  MEDICATIONS  FOR  30  MINUTES   methocarbamol (ROBAXIN) 500 MG tablet Take 500 mg by mouth 3 (three) times daily.   metoprolol tartrate (LOPRESSOR) 25 MG tablet Take 1 tablet by mouth twice daily   ondansetron (ZOFRAN ODT) 8 MG disintegrating tablet Take 1 tablet (8 mg total) by mouth every 8 (eight) hours as needed for nausea or vomiting.   ondansetron (ZOFRAN) 4 MG tablet Take 4 mg by mouth every 6 (six) hours as needed.   oxybutynin (DITROPAN-XL) 5 MG 24 hr tablet Take 5 mg by mouth daily.   Peppermint Oil (IBGARD PO) Take by mouth.   rosuvastatin (CRESTOR) 20 MG tablet Take 1 tablet (20 mg total) by mouth daily.   tamsulosin (FLOMAX) 0.4 MG CAPS capsule  Take 0.4 mg by mouth at bedtime.   traMADol (ULTRAM) 50 MG tablet Take 50 mg by mouth every morning.   [DISCONTINUED] linaclotide (LINZESS) 145 MCG CAPS capsule TAKE 1 CAPSULE BY MOUTH ONCE DAILY BEFORE BREAKFAST   linaclotide (LINZESS) 145 MCG CAPS capsule TAKE 1 CAPSULE BY MOUTH ONCE DAILY BEFORE BREAKFAST   No facility-administered encounter medications on file as of 01/20/2021.    Allergies as of 01/20/2021 - Review Complete 01/20/2021  Allergen Reaction Noted   Penicillins Nausea Only 12/29/2017   Latex Rash 11/27/2016    Past Medical History:  Diagnosis Date   Anxiety    Bulging lumbar disc    CAP  (community acquired pneumonia)    Cardiac arrhythmia due to congenital heart disease    no arrhythmia identified    Celiac disease    Depression    Diverticulosis    Sigmoid colon   Fatty liver 05/2016   Frequent headaches    GERD (gastroesophageal reflux disease)    Heart disease    History of kidney stones    Hypertension    Kidney stones    Migraine    Obesity (BMI 35.0-39.9 without comorbidity)    UTI (urinary tract infection)     Past Surgical History:  Procedure Laterality Date   ABDOMINAL EXPLORATION SURGERY  1986   COLONOSCOPY  06/2016   ESOPHAGEAL MANOMETRY N/A 12/06/2019   Procedure: ESOPHAGEAL MANOMETRY (EM);  Surgeon: Mauri Pole, MD;  Location: [facility];  Service: Endoscopy;  Laterality: N/A;   EXTRACORPOREAL SHOCK WAVE LITHOTRIPSY Left 12/03/2016   Procedure: LEFT EXTRACORPOREAL SHOCK WAVE LITHOTRIPSY (ESWL);  Surgeon: Irine Seal, MD;  Location: [facility];  Service: Urology;  Laterality: Left;   KIDNEY STONE SURGERY     LEFT HEART CATH AND CORONARY ANGIOGRAPHY  07/28/2016    Large tortuous coronary arteries no radiographic evidence of disease.  Not anginal chest pain.  Normal EF.   LITHOTRIPSY     NASAL SINUS SURGERY     ROTATOR CUFF REPAIR Right 12/27/2017   TRANSTHORACIC ECHOCARDIOGRAM  06/2017   EF 60-65%.  GR 1-2 DD.  Otherwise normal echo.  Normal valve function.  Normal wall motion.   TUBAL LIGATION      Family History  Problem Relation Age of Onset   Gallstones Mother    COPD Father    Emphysema Father    Colon cancer Neg Hx    Stomach cancer Neg Hx    Esophageal cancer Neg Hx    Pancreatic cancer Neg Hx    Liver disease Neg Hx     Social History   Socioeconomic History   Marital status: Single    Spouse name: Not on file   Number of children: Not on file   Years of education: Not on file   Highest education level: Not on file  Occupational History   Not on file  Tobacco Use   Smoking status: Former    Types: Cigarettes     Quit date: 2008    Years since quitting: 14.8   Smokeless tobacco: Never   Tobacco comments:    used tobacco "socially"  Scientific laboratory technician Use: Never used  Substance and Sexual Activity   Alcohol use: No    Alcohol/week: 0.0 standard drinks   Drug use: No   Sexual activity: Not on file  Other Topics Concern   Not on file  Social History Narrative   Single.   Has a set of Twins, age 23.  Works for the [employer] and [employer] and [employer]   Enjoys relaxing, spending time with her children.   Social Determinants of Health   Financial Resource Strain: Not on file  Food Insecurity: Not on file  Transportation Needs: Not on file  Physical Activity: Not on file  Stress: Not on file  Social Connections: Not on file  Intimate Partner Violence: Not on file      Review of systems: All other review of systems negative except as mentioned in the HPI.   Physical Exam: Vitals:   01/20/21 1408  BP: 106/68  Pulse: 72   Body mass index is 32.81 kg/m. Gen:      No acute distress HEENT:  sclera anicteric Abd:      soft, non-tender; no palpable masses, no distension Ext:    No edema Neuro: alert and oriented x 3 Psych: normal mood and affect  Data Reviewed:  Reviewed labs, radiology imaging, old records and pertinent past GI work up   Assessment and Plan/Recommendations:  55 year old female with history of obesity, type 2 diabetes, hiatal hernia, GERD, chronic idiopathic constipation, and fatty liver   Chronic idiopathic constipation: Continue Linzess 145 mcg daily Increase dietary fiber and fluid intake  Fatty liver: LFT trended down to baseline with dietary changes and weight loss Advised patient to continue with a low-carb low-fat diet and exercise ' Avoid alcohol, over-the-counter herbal remedies and NSAIDs   GERD and hiatal hernia: Overall symptoms are stable, GERD symptoms significantly improved after she intentionally lost weight with dietary  changes   Return in 1 year  This visit required 40 minutes of patient care (this includes precharting, chart review, review of results, face-to-face time used for counseling as well as treatment plan and follow-up. The patient was provided an opportunity to ask questions and all were answered. The patient agreed with the plan and demonstrated an understanding of the instructions.  Damaris Hippo , MD    CC: [name], NP

## 2021-02-04 ENCOUNTER — Encounter: Payer: Self-pay | Admitting: Gastroenterology

## 2021-02-12 ENCOUNTER — Other Ambulatory Visit: Payer: Self-pay

## 2021-02-12 ENCOUNTER — Encounter: Payer: Self-pay | Admitting: Primary Care

## 2021-02-12 ENCOUNTER — Telehealth (INDEPENDENT_AMBULATORY_CARE_PROVIDER_SITE_OTHER): Payer: 59 | Admitting: Primary Care

## 2021-02-12 VITALS — BP 110/68 | HR 97 | Temp 101.5°F | Ht 62.0 in | Wt 172.0 lb

## 2021-02-12 DIAGNOSIS — R110 Nausea: Secondary | ICD-10-CM | POA: Diagnosis not present

## 2021-02-12 DIAGNOSIS — J069 Acute upper respiratory infection, unspecified: Secondary | ICD-10-CM | POA: Diagnosis not present

## 2021-02-12 MED ORDER — ONDANSETRON 4 MG PO TBDP: 4.0000 mg | ORAL_TABLET | Freq: Three times a day (TID) | ORAL | 0 refills | Status: DC | PRN

## 2021-02-12 MED ORDER — KETOROLAC TROMETHAMINE 60 MG/2ML IM SOLN
60.0000 mg | Freq: Once | INTRAMUSCULAR | Status: AC
  Administered 2021-02-12: 60 mg via INTRAMUSCULAR

## 2021-02-12 NOTE — Addendum Note (Signed)
Addended by: Francella Solian on: 02/12/2021 04:06 PM   Modules accepted: Orders

## 2021-02-12 NOTE — Assessment & Plan Note (Signed)
Symptoms largely represent viral illness.  Rapid influenza and Covid tests are negative today.  Discussed viral etiology and trajectory today. IM Toradol 60 mg provided for body aches, fevers, headache.  Rx for Zofran provided to use PRN nausea.   She will update.

## 2021-02-12 NOTE — Patient Instructions (Signed)
You may take the Zofran every 8 hours as needed for nausea.  Start Ibuprofen 400 mg and Tylenol 500 mg every 8 hours as needed for body aches, headaches, fevers.  It was a pleasure to see you today!  Upper Respiratory Infection, Adult An upper respiratory infection (URI) affects the nose, throat, and upper airways that lead to the lungs. The most common type of URI is often called the common cold. URIs usually get better on their own, without medical treatment. What are the causes? A URI is caused by a germ (virus). You may catch these germs by: Breathing in droplets from an infected person's cough or sneeze. Touching something that has the germ on it (is contaminated) and then touching your mouth, nose, or eyes. What increases the risk? You are more likely to get a URI if: You are very young or very old. You have close contact with others, such as at work, school, or a health care facility. You smoke. You have long-term (chronic) heart or lung disease. You have a weakened disease-fighting system (immune system). You have nasal allergies or asthma. You have a lot of stress. You have poor nutrition. What are the signs or symptoms? Runny or stuffy (congested) nose. Cough. Sneezing. Sore throat. Headache. Feeling tired (fatigue). Fever. Not wanting to eat as much as usual. Pain in your forehead, behind your eyes, and over your cheekbones (sinus pain). Muscle aches. Redness or irritation of the eyes. Pressure in the ears or face. How is this treated? URIs usually get better on their own within 7-10 days. Medicines cannot cure URIs, but your doctor may recommend certain medicines to help relieve symptoms, such as: Over-the-counter cold medicines. Medicines to reduce coughing (cough suppressants). Coughing is a type of defense against infection that helps to clear the nose, throat, windpipe, and lungs (respiratory system). Take these medicines only as told by your doctor. Medicines  to lower your fever. Follow these instructions at home: Activity Rest as needed. If you have a fever, stay home from work or school until your fever is gone, or until your doctor says you may return to work or school. You should stay home until you cannot spread the infection anymore (you are not contagious). Your doctor may have you wear a face mask so you have less risk of spreading the infection. Relieving symptoms Rinse your mouth often with salt water. To make salt water, dissolve -1 tsp (3-6 g) of salt in 1 cup (237 mL) of warm water. Use a cool-mist humidifier to add moisture to the air. This can help you breathe more easily. Eating and drinking  Drink enough fluid to keep your pee (urine) pale yellow. Eat soups and other clear broths. General instructions  Take over-the-counter and prescription medicines only as told by your doctor. Do not smoke or use any products that contain nicotine or tobacco. If you need help quitting, ask your doctor. Avoid being where people are smoking (avoid secondhand smoke). Stay up to date on all your shots (immunizations), and get the flu shot every year. Keep all follow-up visits. How to prevent the spread of infection to others  Wash your hands with soap and water for at least 20 seconds. If you cannot use soap and water, use hand sanitizer. Avoid touching your mouth, face, eyes, or nose. Cough or sneeze into a tissue or your sleeve or elbow. Do not cough or sneeze into your hand or into the air. Contact a doctor if: You are getting worse, not  better. You have any of these: A fever or chills. Brown or red mucus in your nose. Yellow or brown fluid (discharge)coming from your nose. Pain in your face, especially when you bend forward. Swollen neck glands. Pain when you swallow. White areas in the back of your throat. Get help right away if: You have shortness of breath that gets worse. You have very bad or constant: Headache. Ear  pain. Pain in your forehead, behind your eyes, and over your cheekbones (sinus pain). Chest pain. You have long-lasting (chronic) lung disease along with any of these: Making high-pitched whistling sounds when you breathe, most often when you breathe out (wheezing). Long-lasting cough (more than 14 days). Coughing up blood. A change in your usual mucus. You have a stiff neck. You have changes in your: Vision. Hearing. Thinking. Mood. These symptoms may be an emergency. Get help right away. Call 911. Do not wait to see if the symptoms will go away. Do not drive yourself to the hospital. Summary An upper respiratory infection (URI) is caused by a germ (virus). The most common type of URI is often called the common cold. URIs usually get better within 7-10 days. Take over-the-counter and prescription medicines only as told by your doctor. This information is not intended to replace advice given to you by your health care provider. Make sure you discuss any questions you have with your health care provider. Document Revised: 09/25/2020 Document Reviewed: 09/25/2020 Elsevier Patient Education  [place].

## 2021-02-12 NOTE — Progress Notes (Deleted)
Patient ID: Abigail [name], female    DOB: October 27, 1965, 55 y.o.   MRN: [id number]  Virtual visit completed through [place], a video enabled telemedicine application. Due to national recommendations of social distancing due to COVID-19, a virtual visit is felt to be most appropriate for this patient at this time. Reviewed limitations, risks, security and privacy concerns of performing a virtual visit and the availability of in person appointments. I also reviewed that there may be a patient responsible charge related to this service. The patient agreed to proceed.   Patient location: home Provider location: [place] at [facility], office Persons participating in this virtual visit: patient, provider   If any vitals were documented, they were collected by patient at home unless specified below.    Temp (!) 101.5 F (38.6 C) (Temporal)   Ht _0  (1.575 m)   Wt 172 lb (78 kg)   LMP 08/12/2015 Comment: patient states hx. tubal ligation  BMI 31.46 kg/m   CC: URI Symptoms Subjective:   HPI: Abigail [name] is a 55 y.o. female with a history of CAP, hyperlipidemia, type 2 diabetes, morbid obesity, chronic pain, presenting on 02/12/2021 for to discuss URI symptoms.  Symptoms include fevers, body aches, headaches that began this morning upon waking.        Relevant past medical, surgical, family and social history reviewed and updated as indicated. Interim medical history since our last visit reviewed. Allergies and medications reviewed and updated. Outpatient Medications Prior to Visit  Medication Sig Dispense Refill   Accu-Chek FastClix Lancets MISC USE 1 LANCET TO CHECK BLOOD SUGAR THREE TIMES DAILY 102 each 5   albuterol (PROAIR HFA) 108 (90 Base) MCG/ACT inhaler Inhale 2 puffs into the lungs every 6 (six) hours as needed for wheezing or shortness of breath. 8 g 0   Blood Glucose Monitoring Suppl (ACCU-CHEK GUIDE) w/Device KIT by Does not apply route.      Cholecalciferol (VITAMIN D3) 1.25 MG (50000 UT) CAPS Take 1 capsule by mouth once a week 12 capsule 0   esomeprazole (NEXIUM) 40 MG capsule TAKE 1 CAPSULE BY MOUTH DAILY 90 capsule 1   FLUoxetine (PROZAC) 10 MG capsule Take 1 capsule (10 mg total) by mouth daily. For depression and anxiety. Take with 40 mg dose. 90 capsule 1   FLUoxetine (PROZAC) 40 MG capsule Take 1 capsule (40 mg total) by mouth daily. For anxiety 90 capsule 3   HYDROcodone-acetaminophen (NORCO/VICODIN) 5-325 MG tablet Take 1 tablet by mouth every 6 (six) hours as needed. for pain     hydrOXYzine (ATARAX/VISTARIL) 10 MG tablet Take 1 to 2 tablets by mouth twice daily as needed for anxiety. 30 tablet 0   Lancets Misc. (ACCU-CHEK FASTCLIX LANCET) KIT Use as directed to test blood sugar daily 1 kit 0   levothyroxine (EUTHYROX) 75 MCG tablet TAKE 1 TABLET BY MOUTH IN THE MORNING ON  AN  EMPTY  STOMACH  WITH  WATER  &  NO  FOOD  OR  OTHER  MEDICATIONS  FOR  30  MINUTES 90 tablet 2   linaclotide (LINZESS) 145 MCG CAPS capsule TAKE 1 CAPSULE BY MOUTH ONCE DAILY BEFORE BREAKFAST 90 capsule 3   methocarbamol (ROBAXIN) 500 MG tablet Take 500 mg by mouth 3 (three) times daily.     metoprolol tartrate (LOPRESSOR) 25 MG tablet Take 1 tablet by mouth twice daily 60 tablet 10   ondansetron (ZOFRAN) 4 MG tablet Take 4 mg by mouth every 6 (six)  hours as needed.     oxybutynin (DITROPAN-XL) 5 MG 24 hr tablet Take 5 mg by mouth daily.  1   rosuvastatin (CRESTOR) 20 MG tablet Take 1 tablet (20 mg total) by mouth daily. 90 tablet 3   tamsulosin (FLOMAX) 0.4 MG CAPS capsule Take 0.4 mg by mouth at bedtime.     traMADol (ULTRAM) 50 MG tablet Take 50 mg by mouth every morning.     Peppermint Oil (IBGARD PO) Take by mouth. (Patient not taking: Reported on 02/12/2021)     glucose blood (ACCU-CHEK GUIDE) test strip USE 1 STRIP   THREE TIMES DAILY 100 each 1   ondansetron (ZOFRAN ODT) 8 MG disintegrating tablet Take 1 tablet (8 mg total) by mouth every 8  (eight) hours as needed for nausea or vomiting. 20 tablet 1   No facility-administered medications prior to visit.     Per HPI unless specifically indicated in ROS section below Review of Systems Objective:  Temp (!) 101.5 F (38.6 C) (Temporal)   Ht _0  (1.575 m)   Wt 172 lb (78 kg)   LMP 08/12/2015 Comment: patient states hx. tubal ligation  BMI 31.46 kg/m   Wt Readings from Last 3 Encounters:  02/12/21 172 lb (78 kg)  01/20/21 179 lb 6.4 oz (81.4 kg)  11/22/20 173 lb (78.5 kg)       Physical exam: General: Alert and oriented x 3, no distress, does not appear sickly  Pulmonary: Speaks in complete sentences without increased work of breathing, no cough during visit.  Psychiatric: Normal mood, thought content, and behavior.     Results for orders placed or performed in visit on 01/20/21  Hepatic function panel  Result Value Ref Range   Total Bilirubin 1.3 (H) 0.2 - 1.2 mg/dL   Bilirubin, Direct 0.2 0.0 - 0.3 mg/dL   Alkaline Phosphatase 99 39 - 117 U/L   AST 18 0 - 37 U/L   ALT 23 0 - 35 U/L   Total Protein 6.9 6.0 - 8.3 g/dL   Albumin 4.2 3.5 - 5.2 g/dL   Assessment & Plan:   Problem List Items Addressed This Visit   None    No orders of the defined types were placed in this encounter.  No orders of the defined types were placed in this encounter.   I discussed the assessment and treatment plan with the patient. The patient was provided an opportunity to ask questions and all were answered. The patient agreed with the plan and demonstrated an understanding of the instructions. The patient was advised to call back or seek an in-person evaluation if the symptoms worsen or if the condition fails to improve as anticipated.  Follow up plan:  No follow-ups on file.  [name], NP

## 2021-02-12 NOTE — Progress Notes (Signed)
Subjective:    Patient ID: Abigail [name], female    DOB: [birth year], 55 y.o.   MRN: [id number]  HPI  Abigail [name] is a very pleasant 55 y.o. female with a history of type 2 diabetes, CAP, overactive bladder, obesity, anxiety and depression, fatigue who presents today to discuss URI symptoms.  Symptoms include chills, body aches, fever, headache, nausea, mild cough that began early this morning (1am) suddenly, woke her from sleep. She has not tested for Covid-19 or influenza.  Now she continues to notice body aches all over, chills, nausea. Fevers have been running 101.5 today.   She's taken [employer] today. She's trying to hydrate with some water.   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 Doesn't recall any sick individual for whom she was exposed.    Review of Systems  Constitutional:  Positive for chills, fatigue and fever.  HENT:  Positive for sinus pain.   Respiratory:  Positive for cough.   Musculoskeletal:  Positive for myalgias.  Neurological:  Positive for headaches.        Past Medical History:  Diagnosis Date   Anxiety    Bulging lumbar disc    CAP (community acquired pneumonia)    Cardiac arrhythmia due to congenital heart disease    no arrhythmia identified    Celiac disease    Depression    Diverticulosis    Sigmoid colon   Fatty liver 05/2016   Frequent headaches    GERD (gastroesophageal reflux disease)    Heart disease    History of kidney stones    Hypertension    Kidney stones    Migraine    Obesity (BMI 35.0-39.9 without comorbidity)    UTI (urinary tract infection)     Social History   Socioeconomic History   Marital status: Single    Spouse name: Not on file   Number of children: Not on file   Years of education: Not on file   Highest education level: Not on file  Occupational History   Not on file  Tobacco Use   Smoking status: Former    Types: Cigarettes    Quit date: 2008     Years since quitting: 14.9   Smokeless tobacco: Never   Tobacco comments:    used tobacco "socially"  Scientific laboratory technician Use: Never used  Substance and Sexual Activity   Alcohol use: No    Alcohol/week: 0.0 standard drinks   Drug use: No   Sexual activity: Not on file  Other Topics Concern   Not on file  Social History Narrative   Single.   Has a set of Twins, age 81.   Works for the [employer] and [employer] and [employer]   Enjoys relaxing, spending time with her children.   Social Determinants of Health   Financial Resource Strain: Not on file  Food Insecurity: Not on file  Transportation Needs: Not on file  Physical Activity: Not on file  Stress: Not on file  Social Connections: Not on file  Intimate Partner Violence: Not on file    Past Surgical History:  Procedure Laterality Date   [place]   COLONOSCOPY  06/2016   ESOPHAGEAL MANOMETRY N/A 12/06/2019   Procedure: ESOPHAGEAL MANOMETRY (EM);  Surgeon: Mauri Pole, MD;  Location: [facility];  Service: Endoscopy;  Laterality: N/A;   EXTRACORPOREAL SHOCK WAVE LITHOTRIPSY Left 12/03/2016   Procedure:  LEFT EXTRACORPOREAL SHOCK WAVE LITHOTRIPSY (ESWL);  Surgeon: Irine Seal, MD;  Location: [facility];  Service: Urology;  Laterality: Left;   KIDNEY STONE SURGERY     LEFT HEART CATH AND CORONARY ANGIOGRAPHY  07/28/2016    Large tortuous coronary arteries no radiographic evidence of disease.  Not anginal chest pain.  Normal EF.   LITHOTRIPSY     NASAL SINUS SURGERY     ROTATOR CUFF REPAIR Right 12/27/2017   TRANSTHORACIC ECHOCARDIOGRAM  06/2017   EF 60-65%.  GR 1-2 DD.  Otherwise normal echo.  Normal valve function.  Normal wall motion.   TUBAL LIGATION      Family History  Problem Relation Age of Onset   Gallstones Mother    COPD Father    Emphysema Father    Colon cancer Neg Hx    Stomach cancer Neg Hx    Esophageal cancer Neg Hx    Pancreatic cancer Neg Hx    Liver  disease Neg Hx     Allergies  Allergen Reactions   Penicillins Nausea Only    Has patient had a PCN reaction causing immediate rash, facial/tongue/throat swelling, SOB or lightheadedness with hypotension: No Has patient had a PCN reaction causing severe rash involving mucus membranes or skin necrosis: No Has patient had a PCN reaction that required hospitalization: No Has patient had a PCN reaction occurring within the last 10 years: No If all of the above answers are "NO", then may proceed with Cephalosporin use.    Latex Rash    Current Outpatient Medications on File Prior to Visit  Medication Sig Dispense Refill   Accu-Chek FastClix Lancets MISC USE 1 LANCET TO CHECK BLOOD SUGAR THREE TIMES DAILY 102 each 5   albuterol (PROAIR HFA) 108 (90 Base) MCG/ACT inhaler Inhale 2 puffs into the lungs every 6 (six) hours as needed for wheezing or shortness of breath. 8 g 0   Blood Glucose Monitoring Suppl (ACCU-CHEK GUIDE) w/Device KIT by Does not apply route.     Cholecalciferol (VITAMIN D3) 1.25 MG (50000 UT) CAPS Take 1 capsule by mouth once a week 12 capsule 0   esomeprazole (NEXIUM) 40 MG capsule TAKE 1 CAPSULE BY MOUTH DAILY 90 capsule 1   FLUoxetine (PROZAC) 10 MG capsule Take 1 capsule (10 mg total) by mouth daily. For depression and anxiety. Take with 40 mg dose. 90 capsule 1   FLUoxetine (PROZAC) 40 MG capsule Take 1 capsule (40 mg total) by mouth daily. For anxiety 90 capsule 3   HYDROcodone-acetaminophen (NORCO/VICODIN) 5-325 MG tablet Take 1 tablet by mouth every 6 (six) hours as needed. for pain     hydrOXYzine (ATARAX/VISTARIL) 10 MG tablet Take 1 to 2 tablets by mouth twice daily as needed for anxiety. 30 tablet 0   Lancets Misc. (ACCU-CHEK FASTCLIX LANCET) KIT Use as directed to test blood sugar daily 1 kit 0   levothyroxine (EUTHYROX) 75 MCG tablet TAKE 1 TABLET BY MOUTH IN THE MORNING ON  AN  EMPTY  STOMACH  WITH  WATER  &  NO  FOOD  OR  OTHER  MEDICATIONS  FOR  30  MINUTES 90  tablet 2   linaclotide (LINZESS) 145 MCG CAPS capsule TAKE 1 CAPSULE BY MOUTH ONCE DAILY BEFORE BREAKFAST 90 capsule 3   methocarbamol (ROBAXIN) 500 MG tablet Take 500 mg by mouth 3 (three) times daily.     metoprolol tartrate (LOPRESSOR) 25 MG tablet Take 1 tablet by mouth twice daily 60 tablet 10  ondansetron (ZOFRAN) 4 MG tablet Take 4 mg by mouth every 6 (six) hours as needed.     oxybutynin (DITROPAN-XL) 5 MG 24 hr tablet Take 5 mg by mouth daily.  1   rosuvastatin (CRESTOR) 20 MG tablet Take 1 tablet (20 mg total) by mouth daily. 90 tablet 3   tamsulosin (FLOMAX) 0.4 MG CAPS capsule Take 0.4 mg by mouth at bedtime.     traMADol (ULTRAM) 50 MG tablet Take 50 mg by mouth every morning.     Peppermint Oil (IBGARD PO) Take by mouth. (Patient not taking: Reported on 02/12/2021)     No current facility-administered medications on file prior to visit.    Temp (!) 101.5 F (38.6 C) (Temporal)   Ht 5' 2"  (1.575 m)   Wt 172 lb (78 kg)   LMP 08/12/2015 Comment: patient states hx. tubal ligation  BMI 31.46 kg/m  Objective:   Physical Exam HENT:     Right Ear: Tympanic membrane and ear canal normal.     Left Ear: Tympanic membrane and ear canal normal.     Nose:     Right Sinus: No maxillary sinus tenderness or frontal sinus tenderness.     Left Sinus: No maxillary sinus tenderness or frontal sinus tenderness.     Mouth/Throat:     Pharynx: No posterior oropharyngeal erythema.  Eyes:     Conjunctiva/sclera: Conjunctivae normal.  Cardiovascular:     Rate and Rhythm: Normal rate and regular rhythm.  Pulmonary:     Effort: Pulmonary effort is normal.     Breath sounds: Normal breath sounds. No wheezing or rales.  Musculoskeletal:     Cervical back: Neck supple.  Lymphadenopathy:     Cervical: No cervical adenopathy.  Skin:    General: Skin is warm and dry.          Assessment & Plan:      This visit occurred during the SARS-CoV-2 public health emergency.  Safety protocols  were in place, including screening questions prior to the visit, additional usage of staff PPE, and extensive cleaning of exam room while observing appropriate contact time as indicated for disinfecting solutions.

## 2021-02-13 ENCOUNTER — Telehealth: Payer: Self-pay | Admitting: Primary Care

## 2021-02-13 ENCOUNTER — Other Ambulatory Visit: Payer: Self-pay | Admitting: Primary Care

## 2021-02-13 DIAGNOSIS — E559 Vitamin D deficiency, unspecified: Secondary | ICD-10-CM

## 2021-02-13 DIAGNOSIS — J069 Acute upper respiratory infection, unspecified: Secondary | ICD-10-CM

## 2021-02-13 NOTE — Telephone Encounter (Signed)
Pt called she said that she was feeling good after the shot yesterday, now she is back to the body aches, headache, no voice  Wanted to know if something could be called in, she was seen yesterday

## 2021-02-13 NOTE — Telephone Encounter (Signed)
Was not given flu vaccination was Toradol

## 2021-02-13 NOTE — Telephone Encounter (Signed)
I recommend she tries:  400 mg of Ibuprofen with 500 mg of Tylenol every 8 hours for body aches, fevers, headaches. I think I put this on her AVS.

## 2021-02-14 MED ORDER — HYDROCODONE BIT-HOMATROP MBR 5-1.5 MG/5ML PO SOLN: 5.0000 mL | Freq: Three times a day (TID) | ORAL | 0 refills | Status: DC | PRN

## 2021-02-14 NOTE — Telephone Encounter (Signed)
Noted, Rx sent to pharmacy. She is not taking Norco routinely.

## 2021-02-14 NOTE — Addendum Note (Signed)
Addended by: [name] on: 02/14/2021 04:10 PM   Modules accepted: Orders

## 2021-02-14 NOTE — Telephone Encounter (Signed)
Called patient given information. Wanted to know about the increased cough/congestion. She has been taking Advil sinuses/cold. Cough is productive but not sure of color. Denies any wheezing or tightness in chest. Thinks fever has broke last night her tem was 98.4. she has been drinking lots of water.

## 2021-02-14 NOTE — Telephone Encounter (Signed)
Pt called to follow up on message from yesterday

## 2021-02-14 NOTE — Telephone Encounter (Signed)
Patient states she has 5 mg #21 tab

## 2021-02-14 NOTE — Telephone Encounter (Signed)
I really don't think the prednisone will help with her current symptoms, especially if she's not had sinus/head pressure, chest tightness, SOB.  I can send in some cough medication for her. Does she want Tessalon Perles (non drowsy) or cough medication with Codeine (will make her drowsy)?

## 2021-02-14 NOTE — Telephone Encounter (Signed)
Called patient she would like cough medications called in. She was given Hydrocod Polst-Chlorphen Polst 10-8 MG/5ML. By Dr. Lorelei Pont in the past and it was helpful. She would like to have that called in.

## 2021-02-14 NOTE — Telephone Encounter (Signed)
What strength of prednisone does she have? 20 mg? How many pills?

## 2021-02-14 NOTE — Telephone Encounter (Signed)
Patient also wanted to let you know that she has some prednisone that was given a month or so ago if you are ok with her taking.

## 2021-02-21 ENCOUNTER — Telehealth: Payer: Self-pay

## 2021-02-21 DIAGNOSIS — E039 Hypothyroidism, unspecified: Secondary | ICD-10-CM

## 2021-02-21 MED ORDER — LEVOTHYROXINE SODIUM 75 MCG PO TABS: ORAL_TABLET | ORAL | 0 refills | Status: DC

## 2021-02-21 NOTE — Telephone Encounter (Signed)
Received fax from pharmacy that previous manufacture is no longer available would like to dispense accord brand.

## 2021-02-21 NOTE — Telephone Encounter (Signed)
Noted, new Rx sent to pharmacy

## 2021-02-21 NOTE — Addendum Note (Signed)
Addended by: [name] on: 02/21/2021 08:00 AM   Modules accepted: Orders

## 2021-03-04 ENCOUNTER — Telehealth (INDEPENDENT_AMBULATORY_CARE_PROVIDER_SITE_OTHER): Payer: 59 | Admitting: Family Medicine

## 2021-03-04 ENCOUNTER — Other Ambulatory Visit: Payer: Self-pay

## 2021-03-04 ENCOUNTER — Encounter: Payer: Self-pay | Admitting: Family Medicine

## 2021-03-04 DIAGNOSIS — J019 Acute sinusitis, unspecified: Secondary | ICD-10-CM

## 2021-03-04 DIAGNOSIS — J069 Acute upper respiratory infection, unspecified: Secondary | ICD-10-CM

## 2021-03-04 MED ORDER — ALBUTEROL SULFATE HFA 108 (90 BASE) MCG/ACT IN AERS: 2.0000 | INHALATION_SPRAY | Freq: Four times a day (QID) | RESPIRATORY_TRACT | 1 refills | Status: DC | PRN

## 2021-03-04 MED ORDER — FLUCONAZOLE 150 MG PO TABS: 150.0000 mg | ORAL_TABLET | Freq: Once | ORAL | 1 refills | Status: AC

## 2021-03-04 MED ORDER — HYDROCODONE BIT-HOMATROP MBR 5-1.5 MG/5ML PO SOLN: 5.0000 mL | Freq: Three times a day (TID) | ORAL | 0 refills | Status: DC | PRN

## 2021-03-04 MED ORDER — DOXYCYCLINE HYCLATE 100 MG PO TABS: 100.0000 mg | ORAL_TABLET | Freq: Two times a day (BID) | ORAL | 0 refills | Status: DC

## 2021-03-04 MED ORDER — ONDANSETRON HCL 4 MG PO TABS: 4.0000 mg | ORAL_TABLET | Freq: Three times a day (TID) | ORAL | 0 refills | Status: DC | PRN

## 2021-03-04 NOTE — Patient Instructions (Addendum)
I think you have a sinus infection   Drink fluids and rest  mucinex DM is good for cough and congestion (or your advil cold and sinus medication)  Nasal saline for congestion as needed  Take the doxycycline for sinus infection  Use albuterol and cough medicine as needed (caution of sedation)  Continue the nasacort nasal spray  Please alert [place] if symptoms worsen (if severe or short of breath please go to the ER)    If you get a yeast infection -take the diflucan once   If you get more wheezing or chest tightness please call and let us know

## 2021-03-04 NOTE — Assessment & Plan Note (Addendum)
1 month s/p viral uri  Sinus pain /green nasal mucous and cough  Will tx with doxycycline  hydocan for cough, zoftan for nausea Discussed sympt care -nasal saline / nasacort ns  Give px for diflucan to use if needed for yeast infx (from abx)  Refilled albuterol, inst to call if wheezing worsens (would consider albuterol) ER precautions discussed Update if not starting to improve in a week or if worsening

## 2021-03-04 NOTE — Assessment & Plan Note (Signed)
Viral uri approx a 1moago Now with s/s of acute bacterial sinusitis

## 2021-03-04 NOTE — Progress Notes (Signed)
Virtual Visit via Video Note  I connected with [place] on 03/04/21 at  3:30 PM EST by a video enabled telemedicine application and verified that I am speaking with the correct person using two identifiers.  Location: Patient: home Provider: office    I discussed the limitations of evaluation and management by telemedicine and the availability of in person appointments. The patient expressed understanding and agreed to proceed.  Parties involved in encounter  Patient: Abigail [name]  Provider:  Loura Pardon MD   Video failed today so visit was done by phone   History of Present Illness: Pt presents with sinus symptoms and nasal congestion  55 yo pt of NP [facility] with a h/o DM2  (former smoker)   Has been sick on /off for 4 weeks  Was dx with a viral uri   Neg covid test early on  Now she continues to have bad nasal congestion  Thick mucous is yellow and green A lot of pain around eyes/face -both sides  Hoarse voice  Cough is mostly dry (seldom phlegm that is yellow)  Ears feel ok  Throat- a little sore/scratchy from drainage   Using inhaler -albuterol  Needs a refill   Normal temp now  Some aching  No chills   Otc: Advil cold/allergy  Inhaler  Nasacort  Has some saline nasal spray     No diarrhea  No loss of taste or smell  Wants refill of hycodan for cough  She does not mix norco with it   Patient Active Problem List   Diagnosis Date Noted   Viral URI 02/12/2021   Fatigue 11/22/2020   Incomplete bladder emptying 07/19/2020   Preventative health care 05/29/2020   Flank pain 05/29/2020   Overactive bladder 05/29/2020   Chronic constipation 05/29/2020   Vitamin D deficiency 05/29/2020   Frequent headaches 07/04/2019   Right upper quadrant pain 11/29/2018   Type 2 diabetes mellitus without complication, without long-term current use of insulin ([place]) 11/29/2018   Acute sinusitis 10/11/2018   CAP (community acquired pneumonia) 12/30/2017    Hyperlipidemia associated with type 2 diabetes mellitus ([place]) 08/05/2017   PVC's (premature ventricular contractions) 06/23/2017   Hypothyroidism 09/17/2016   Allergic rhinitis 06/06/2015   Palpitations 04/11/2015   Neck pain 06/29/2014   Chronic back pain 06/29/2014   Esophageal reflux 06/29/2014   Moderate obesity 05/23/2013   Normal coronary arteries- 2012 05/23/2013   Suspected sleep apnea 05/23/2013   Anxiety and depression 05/23/2013   History of medication noncompliance 05/23/2013   Past Medical History:  Diagnosis Date   Anxiety    Bulging lumbar disc    CAP (community acquired pneumonia)    Cardiac arrhythmia due to congenital heart disease    no arrhythmia identified    Celiac disease    Depression    Diverticulosis    Sigmoid colon   Fatty liver 05/2016   Frequent headaches    GERD (gastroesophageal reflux disease)    Heart disease    History of kidney stones    Hypertension    Kidney stones    Migraine    Obesity (BMI 35.0-39.9 without comorbidity)    UTI (urinary tract infection)    Past Surgical History:  Procedure Laterality Date   ABDOMINAL EXPLORATION SURGERY  1986   COLONOSCOPY  06/2016   ESOPHAGEAL MANOMETRY N/A 12/06/2019   Procedure: ESOPHAGEAL MANOMETRY (EM);  Surgeon: Mauri Pole, MD;  Location: [facility];  Service: Endoscopy;  Laterality: N/A;   EXTRACORPOREAL  SHOCK WAVE LITHOTRIPSY Left 12/03/2016   Procedure: LEFT EXTRACORPOREAL SHOCK WAVE LITHOTRIPSY (ESWL);  Surgeon: Irine Seal, MD;  Location: [facility];  Service: Urology;  Laterality: Left;   KIDNEY STONE SURGERY     LEFT HEART CATH AND CORONARY ANGIOGRAPHY  07/28/2016    Large tortuous coronary arteries no radiographic evidence of disease.  Not anginal chest pain.  Normal EF.   LITHOTRIPSY     NASAL SINUS SURGERY     ROTATOR CUFF REPAIR Right 12/27/2017   TRANSTHORACIC ECHOCARDIOGRAM  06/2017   EF 60-65%.  GR 1-2 DD.  Otherwise normal echo.  Normal valve function.  Normal wall  motion.   TUBAL LIGATION     Social History   Tobacco Use   Smoking status: Former    Types: Cigarettes    Quit date: 2008    Years since quitting: 14.9   Smokeless tobacco: Never   Tobacco comments:    used tobacco "sociallyFinancial risk analyst Use: Never used  Substance Use Topics   Alcohol use: No    Alcohol/week: 0.0 standard drinks   Drug use: No   Family History  Problem Relation Age of Onset   Gallstones Mother    COPD Father    Emphysema Father    Colon cancer Neg Hx    Stomach cancer Neg Hx    Esophageal cancer Neg Hx    Pancreatic cancer Neg Hx    Liver disease Neg Hx    Allergies  Allergen Reactions   Penicillins Nausea Only    Has patient had a PCN reaction causing immediate rash, facial/tongue/throat swelling, SOB or lightheadedness with hypotension: No Has patient had a PCN reaction causing severe rash involving mucus membranes or skin necrosis: No Has patient had a PCN reaction that required hospitalization: No Has patient had a PCN reaction occurring within the last 10 years: No If all of the above answers are "NO", then may proceed with Cephalosporin use.    Latex Rash   Current Outpatient Medications on File Prior to Visit  Medication Sig Dispense Refill   Accu-Chek FastClix Lancets MISC USE 1 LANCET TO CHECK BLOOD SUGAR THREE TIMES DAILY 102 each 5   Blood Glucose Monitoring Suppl (ACCU-CHEK GUIDE) w/Device KIT by Does not apply route.     Cholecalciferol (VITAMIN D3) 1.25 MG (50000 UT) CAPS Take 1 capsule by mouth once a week 12 capsule 0   esomeprazole (NEXIUM) 40 MG capsule TAKE 1 CAPSULE BY MOUTH DAILY 90 capsule 1   FLUoxetine (PROZAC) 10 MG capsule Take 1 capsule (10 mg total) by mouth daily. For depression and anxiety. Take with 40 mg dose. 90 capsule 1   FLUoxetine (PROZAC) 40 MG capsule Take 1 capsule (40 mg total) by mouth daily. For anxiety 90 capsule 3   HYDROcodone-acetaminophen (NORCO/VICODIN) 5-325 MG tablet Take 1 tablet by mouth  every 6 (six) hours as needed. for pain     hydrOXYzine (ATARAX/VISTARIL) 10 MG tablet Take 1 to 2 tablets by mouth twice daily as needed for anxiety. 30 tablet 0   Lancets Misc. (ACCU-CHEK FASTCLIX LANCET) KIT Use as directed to test blood sugar daily 1 kit 0   levothyroxine (SYNTHROID) 75 MCG tablet Take 1 tablet by mouth every morning on an empty stomach with water only.  No food or other medications for 30 minutes. 90 tablet 0   linaclotide (LINZESS) 145 MCG CAPS capsule TAKE 1 CAPSULE BY MOUTH ONCE DAILY BEFORE BREAKFAST 90 capsule 3  methocarbamol (ROBAXIN) 500 MG tablet Take 500 mg by mouth 3 (three) times daily.     metoprolol tartrate (LOPRESSOR) 25 MG tablet Take 1 tablet by mouth twice daily 60 tablet 10   ondansetron (ZOFRAN-ODT) 4 MG disintegrating tablet Take 1 tablet (4 mg total) by mouth every 8 (eight) hours as needed for nausea or vomiting. 15 tablet 0   oxybutynin (DITROPAN-XL) 5 MG 24 hr tablet Take 5 mg by mouth daily.  1   rosuvastatin (CRESTOR) 20 MG tablet Take 1 tablet (20 mg total) by mouth daily. 90 tablet 3   tamsulosin (FLOMAX) 0.4 MG CAPS capsule Take 0.4 mg by mouth at bedtime.     traMADol (ULTRAM) 50 MG tablet Take 50 mg by mouth every morning.     No current facility-administered medications on file prior to visit.   Review of Systems  Constitutional:  Positive for malaise/fatigue. Negative for chills and fever.  HENT:  Positive for congestion and sinus pain. Negative for ear pain and sore throat.   Eyes:  Negative for blurred vision, discharge and redness.  Respiratory:  Positive for cough, sputum production and wheezing. Negative for shortness of breath and stridor.   Cardiovascular:  Negative for chest pain, palpitations and leg swelling.  Gastrointestinal:  Negative for abdominal pain, diarrhea, nausea and vomiting.  Musculoskeletal:  Negative for myalgias.  Skin:  Negative for rash.  Neurological:  Positive for headaches. Negative for dizziness.      Observations/Objective: Pt sounds mildly hoarse and congested Occ dry cough noted with throat clearing  Not sob with speech and unable to hear any wheezing Nl cognition-good historian Nl mood, pleasant  Assessment and Plan:  Problem List Items Addressed This Visit       Respiratory   Acute sinusitis - Primary    1 month s/p viral uri  Sinus pain /green nasal mucous and cough  Will tx with doxycycline  hydocan for cough, zoftan for nausea Discussed sympt care -nasal saline / nasacort ns  Give px for diflucan to use if needed for yeast infx (from abx)  Refilled albuterol, inst to call if wheezing worsens (would consider albuterol) ER precautions discussed Update if not starting to improve in a week or if worsening        Relevant Medications   doxycycline (VIBRA-TABS) 100 MG tablet   fluconazole (DIFLUCAN) 150 MG tablet   HYDROcodone bit-homatropine (HYCODAN) 5-1.5 MG/5ML syrup   Viral URI    Viral uri approx a 79moago Now with s/s of acute bacterial sinusitis       Relevant Medications   fluconazole (DIFLUCAN) 150 MG tablet   HYDROcodone bit-homatropine (HYCODAN) 5-1.5 MG/5ML syrup      Follow Up Instructions: I think you have a sinus infection   Drink fluids and rest  mucinex DM is good for cough and congestion (or your advil cold and sinus medication)  Nasal saline for congestion as needed  Take the doxycycline for sinus infection  Use albuterol and cough medicine as needed (caution of sedation)  Continue the nasacort nasal spray  Please alert [place] symptoms worsen (if severe or short of breath please go to the ER)    If you get a yeast infection -take the diflucan once   If you get more wheezing or chest tightness please call and let [place]    I discussed the assessment and treatment plan with the patient. The patient was provided an opportunity to ask questions and all were answered. The  patient agreed with the plan and demonstrated an understanding  of the instructions. °  °The patient was advised to call back or seek an in-person evaluation if the symptoms worsen or if the condition fails to improve as anticipated. ° °I provided 18 minutes of non-face-to-face time during this encounter. ° ° ° , MD ° °

## 2021-03-24 ENCOUNTER — Other Ambulatory Visit: Payer: Self-pay | Admitting: Cardiology

## 2021-04-07 ENCOUNTER — Other Ambulatory Visit: Payer: Self-pay

## 2021-04-07 ENCOUNTER — Encounter: Payer: Self-pay | Admitting: Pulmonary Disease

## 2021-04-07 ENCOUNTER — Ambulatory Visit (INDEPENDENT_AMBULATORY_CARE_PROVIDER_SITE_OTHER): Payer: 59 | Admitting: Pulmonary Disease

## 2021-04-07 VITALS — BP 120/70 | HR 65 | Temp 99.0°F | Ht 62.0 in | Wt 179.0 lb

## 2021-04-07 DIAGNOSIS — G4733 Obstructive sleep apnea (adult) (pediatric): Secondary | ICD-10-CM

## 2021-04-07 NOTE — Progress Notes (Signed)
Abigail [name]    [id number]    Sep 28, 1965  Primary Care Physician:Clark, Leticia Penna, NP  Referring Physician: [name], NP [place],   [id number]  Chief complaint:   Daytime sleepiness  HPI:  History of excessive daytime sleepiness, always tired Usually goes to bed between 8 PM and 10 PM Takes a 5 to 10 minutes to fall asleep 2-3 awakenings Up between 545 and 6 AM  History of snoring, wakes herself up snoring Admits to dryness of her mouth, occasional sore throat Rarely morning headaches but does get headaches during the day as well Occasional night sweats  Mother snored Memory is on and off  Reformed smoker   Outpatient Encounter Medications as of 04/07/2021  Medication Sig   Accu-Chek FastClix Lancets MISC USE 1 LANCET TO CHECK BLOOD SUGAR THREE TIMES DAILY   albuterol (PROAIR HFA) 108 (90 Base) MCG/ACT inhaler Inhale 2 puffs into the lungs every 6 (six) hours as needed for wheezing or shortness of breath.   Blood Glucose Monitoring Suppl (ACCU-CHEK GUIDE) w/Device KIT by Does not apply route.   Cholecalciferol (VITAMIN D3) 1.25 MG (50000 UT) CAPS Take 1 capsule by mouth once a week   esomeprazole (NEXIUM) 40 MG capsule TAKE 1 CAPSULE BY MOUTH DAILY   FLUoxetine (PROZAC) 10 MG capsule Take 1 capsule (10 mg total) by mouth daily. For depression and anxiety. Take with 40 mg dose.   FLUoxetine (PROZAC) 40 MG capsule Take 1 capsule (40 mg total) by mouth daily. For anxiety   HYDROcodone-acetaminophen (NORCO/VICODIN) 5-325 MG tablet Take 1 tablet by mouth every 6 (six) hours as needed. for pain   hydrOXYzine (ATARAX/VISTARIL) 10 MG tablet Take 1 to 2 tablets by mouth twice daily as needed for anxiety.   Lancets Misc. (ACCU-CHEK FASTCLIX LANCET) KIT Use as directed to test blood sugar daily   levothyroxine (SYNTHROID) 75 MCG tablet Take 1 tablet by mouth every morning on an empty stomach with water only.  No food or other  medications for 30 minutes.   linaclotide (LINZESS) 145 MCG CAPS capsule TAKE 1 CAPSULE BY MOUTH ONCE DAILY BEFORE BREAKFAST   methocarbamol (ROBAXIN) 500 MG tablet Take 500 mg by mouth 3 (three) times daily.   metoprolol tartrate (LOPRESSOR) 25 MG tablet Take 1 tablet by mouth twice daily   oxybutynin (DITROPAN-XL) 5 MG 24 hr tablet Take 5 mg by mouth daily.   rosuvastatin (CRESTOR) 20 MG tablet Take 1 tablet (20 mg total) by mouth daily.   tamsulosin (FLOMAX) 0.4 MG CAPS capsule Take 0.4 mg by mouth at bedtime.   traMADol (ULTRAM) 50 MG tablet Take 50 mg by mouth every morning.   [DISCONTINUED] doxycycline (VIBRA-TABS) 100 MG tablet Take 1 tablet (100 mg total) by mouth 2 (two) times daily. (Patient not taking: Reported on 04/07/2021)   [DISCONTINUED] HYDROcodone bit-homatropine (HYCODAN) 5-1.5 MG/5ML syrup Take 5 mLs by mouth every 8 (eight) hours as needed for cough. (Patient not taking: Reported on 04/07/2021)   [DISCONTINUED] ondansetron (ZOFRAN) 4 MG tablet Take 1 tablet (4 mg total) by mouth every 8 (eight) hours as needed for nausea or vomiting. (Patient not taking: Reported on 04/07/2021)   [DISCONTINUED] ondansetron (ZOFRAN-ODT) 4 MG disintegrating tablet Take 1 tablet (4 mg total) by mouth every 8 (eight) hours as needed for nausea or vomiting. (Patient not taking: Reported on 04/07/2021)   No facility-administered encounter medications on file as of 04/07/2021.  Allergies as of 04/07/2021 - Review Complete 04/07/2021  Allergen Reaction Noted   Latex Rash 11/27/2016   Penicillins Nausea Only 12/29/2017    Past Medical History:  Diagnosis Date   Anxiety    Bulging lumbar disc    CAP (community acquired pneumonia)    Cardiac arrhythmia due to congenital heart disease    no arrhythmia identified    Celiac disease    Depression    Diverticulosis    Sigmoid colon   Fatty liver 05/2016   Frequent headaches    GERD (gastroesophageal reflux disease)    Heart disease    History  of kidney stones    Hypertension    Kidney stones    Migraine    Obesity (BMI 35.0-39.9 without comorbidity)    UTI (urinary tract infection)     Past Surgical History:  Procedure Laterality Date   ABDOMINAL EXPLORATION SURGERY  1986   COLONOSCOPY  06/2016   ESOPHAGEAL MANOMETRY N/A 12/06/2019   Procedure: ESOPHAGEAL MANOMETRY (EM);  Surgeon: Mauri Pole, MD;  Location: [facility];  Service: Endoscopy;  Laterality: N/A;   EXTRACORPOREAL SHOCK WAVE LITHOTRIPSY Left 12/03/2016   Procedure: LEFT EXTRACORPOREAL SHOCK WAVE LITHOTRIPSY (ESWL);  Surgeon: Irine Seal, MD;  Location: [facility];  Service: Urology;  Laterality: Left;   KIDNEY STONE SURGERY     LEFT HEART CATH AND CORONARY ANGIOGRAPHY  07/28/2016    Large tortuous coronary arteries no radiographic evidence of disease.  Not anginal chest pain.  Normal EF.   LITHOTRIPSY     NASAL SINUS SURGERY     ROTATOR CUFF REPAIR Right 12/27/2017   TRANSTHORACIC ECHOCARDIOGRAM  06/2017   EF 60-65%.  GR 1-2 DD.  Otherwise normal echo.  Normal valve function.  Normal wall motion.   TUBAL LIGATION      Family History  Problem Relation Age of Onset   Gallstones Mother    COPD Father    Emphysema Father    Colon cancer Neg Hx    Stomach cancer Neg Hx    Esophageal cancer Neg Hx    Pancreatic cancer Neg Hx    Liver disease Neg Hx     Social History   Socioeconomic History   Marital status: Single    Spouse name: Not on file   Number of children: Not on file   Years of education: Not on file   Highest education level: Not on file  Occupational History   Not on file  Tobacco Use   Smoking status: Former    Types: Cigarettes    Quit date: 2008    Years since quitting: 15.0   Smokeless tobacco: Never   Tobacco comments:    used tobacco "socially"  Scientific laboratory technician Use: Never used  Substance and Sexual Activity   Alcohol use: No    Alcohol/week: 0.0 standard drinks   Drug use: No   Sexual activity: Not on file   Other Topics Concern   Not on file  Social History Narrative   Single.   Has a set of Twins, age 92.   Works for the [employer] and [employer] and [employer]   Enjoys relaxing, spending time with her children.   Social Determinants of Health   Financial Resource Strain: Not on file  Food Insecurity: Not on file  Transportation Needs: Not on file  Physical Activity: Not on file  Stress: Not on file  Social Connections: Not on file  Intimate Partner Violence: Not  on file    Review of Systems  Constitutional:  Positive for fatigue.  Psychiatric/Behavioral:  Positive for sleep disturbance.    Vitals:   04/07/21 1637  BP: 120/70  Pulse: 65  Temp: 99 F (37.2 C)  SpO2: 97%     Physical Exam Constitutional:      Appearance: She is obese.  HENT:     Head: Normocephalic.     Nose: Nose normal.     Mouth/Throat:     Mouth: Mucous membranes are moist.     Comments: Mallampati 3, crowded oropharynx Eyes:     Pupils: Pupils are equal, round, and reactive to light.  Cardiovascular:     Rate and Rhythm: Normal rate and regular rhythm.     Heart sounds: No murmur heard.   No friction rub.  Pulmonary:     Effort: No respiratory distress.     Breath sounds: No stridor. No wheezing or rhonchi.  Musculoskeletal:     Cervical back: No rigidity or tenderness.  Neurological:     Mental Status: She is alert.  Psychiatric:        Mood and Affect: Mood normal.   Results of the Epworth flowsheet 04/07/2021  Sitting and reading 3  Watching TV 3  Sitting, inactive in a public place (e.g. a theatre or a meeting) 3  As a passenger in a car for an hour without a break 3  Lying down to rest in the afternoon when circumstances permit 3  Sitting and talking to someone 3  Sitting quietly after a lunch without alcohol 2  In a car, while stopped for a few minutes in traffic 2  Total score 22     Data Reviewed: No previous study on record  Assessment:  Excessive daytime  sleepiness  Moderate probability of significant obstructive sleep apnea  Pathophysiology of sleep disordered breathing discussed with the patient Treatment options for sleep disordered breathing discussed with the patient  Plan/Recommendations: Will schedule the patient for home sleep study  Treatment options and we discussed  Tentative follow-up in 3 to 4 months  Encouraged to call with any significant concerns   Sherrilyn Rist MD [facility] 04/07/2021, 5:01 PM  CC: [name], NP

## 2021-04-07 NOTE — Patient Instructions (Signed)
Moderate probability of significant obstructive sleep apnea  We will schedule you for home sleep study Inform you about results as soon as reviewed  Treatment options as we discussed  We will see you in about 3 to 4 months  Sleep Apnea Sleep apnea affects breathing during sleep. It causes breathing to stop for 10 seconds or more, or to become shallow. People with sleep apnea usually snore loudly. It can also increase the risk of: Heart attack. Stroke. Being very overweight (obese). Diabetes. Heart failure. Irregular heartbeat. High blood pressure. The goal of treatment is to help you breathe normally again. What are the causes? The most common cause of this condition is a collapsed or blocked airway. There are three kinds of sleep apnea: Obstructive sleep apnea. This is caused by a blocked or collapsed airway. Central sleep apnea. This happens when the brain does not send the right signals to the muscles that control breathing. Mixed sleep apnea. This is a combination of obstructive and central sleep apnea. What increases the risk? Being overweight. Smoking. Having a small airway. Being older. Being female. Drinking alcohol. Taking medicines to calm yourself (sedatives or tranquilizers). Having family members with the condition. Having a tongue or tonsils that are larger than normal. What are the signs or symptoms? Trouble staying asleep. Loud snoring. Headaches in the morning. Waking up gasping. Dry mouth or sore throat in the morning. Being sleepy or tired during the day. If you are sleepy or tired during the day, you may also: Not be able to focus your mind (concentrate). Forget things. Get angry a lot and have mood swings. Feel sad (depressed). Have changes in your personality. Have less interest in sex, if you are female. Be unable to have an erection, if you are female. How is this treated?  Sleeping on your side. Using a medicine to get rid of mucus in your  nose (decongestant). Avoiding the use of alcohol, medicines to help you relax, or certain pain medicines (narcotics). Losing weight, if needed. Changing your diet. Quitting smoking. Using a machine to open your airway while you sleep, such as: An oral appliance. This is a mouthpiece that shifts your lower jaw forward. A CPAP device. This device blows air through a mask when you breathe out (exhale). An EPAP device. This has valves that you put in each nostril. A BIPAP device. This device blows air through a mask when you breathe in (inhale) and breathe out. Having surgery if other treatments do not work. Follow these instructions at home: Lifestyle Make changes that your doctor recommends. Eat a healthy diet. Lose weight if needed. Avoid alcohol, medicines to help you relax, and some pain medicines. Do not smoke or use any products that contain nicotine or tobacco. If you need help quitting, ask your doctor. General instructions Take over-the-counter and prescription medicines only as told by your doctor. If you were given a machine to use while you sleep, use it only as told by your doctor. If you are having surgery, make sure to tell your doctor you have sleep apnea. You may need to bring your device with you. Keep all follow-up visits. Contact a doctor if: The machine that you were given to use during sleep bothers you or does not seem to be working. You do not get better. You get worse. Get help right away if: Your chest hurts. You have trouble breathing in enough air. You have an uncomfortable feeling in your back, arms, or stomach. You have trouble talking.  One side of your body feels weak. A part of your face is hanging down. These symptoms may be an emergency. Get help right away. Call your local emergency services (911 in the [place] Do not wait to see if the symptoms will go away. Do not drive yourself to the hospital. Summary This condition affects breathing during  sleep. The most common cause is a collapsed or blocked airway. The goal of treatment is to help you breathe normally while you sleep. This information is not intended to replace advice given to you by your health care provider. Make sure you discuss any questions you have with your health care provider. Document Revised: 10/02/2020 Document Reviewed: 02/02/2020 Elsevier Patient Education  2022 [employer].

## 2021-04-25 ENCOUNTER — Other Ambulatory Visit: Payer: Self-pay | Admitting: Cardiology

## 2021-05-08 ENCOUNTER — Encounter: Payer: Self-pay | Admitting: Pulmonary Disease

## 2021-05-16 ENCOUNTER — Encounter (HOSPITAL_COMMUNITY): Payer: Self-pay | Admitting: Urology

## 2021-05-16 ENCOUNTER — Inpatient Hospital Stay (HOSPITAL_COMMUNITY): Payer: 59

## 2021-05-16 ENCOUNTER — Inpatient Hospital Stay (HOSPITAL_COMMUNITY): Payer: 59 | Admitting: Certified Registered Nurse Anesthetist

## 2021-05-16 ENCOUNTER — Inpatient Hospital Stay (HOSPITAL_BASED_OUTPATIENT_CLINIC_OR_DEPARTMENT_OTHER): Payer: 59 | Admitting: Certified Registered Nurse Anesthetist

## 2021-05-16 ENCOUNTER — Other Ambulatory Visit: Payer: Self-pay | Admitting: Urology

## 2021-05-16 ENCOUNTER — Encounter (HOSPITAL_COMMUNITY)
Admission: AD | Disposition: A | Discharge status: Home / Self Care | Payer: Self-pay | Source: Ambulatory Visit | Attending: Urology

## 2021-05-16 ENCOUNTER — Ambulatory Visit (HOSPITAL_COMMUNITY)
Admission: AD | Admit: 2021-05-16 | Discharge: 2021-05-16 | Disposition: A | Discharge status: Home / Self Care | Payer: 59 | Source: Ambulatory Visit | Attending: Urology | Admitting: Urology

## 2021-05-16 DIAGNOSIS — I10 Essential (primary) hypertension: Secondary | ICD-10-CM | POA: Diagnosis not present

## 2021-05-16 DIAGNOSIS — Z79899 Other long term (current) drug therapy: Secondary | ICD-10-CM | POA: Insufficient documentation

## 2021-05-16 DIAGNOSIS — F32A Depression, unspecified: Secondary | ICD-10-CM | POA: Diagnosis not present

## 2021-05-16 DIAGNOSIS — F419 Anxiety disorder, unspecified: Secondary | ICD-10-CM | POA: Diagnosis not present

## 2021-05-16 DIAGNOSIS — Z87891 Personal history of nicotine dependence: Secondary | ICD-10-CM | POA: Diagnosis not present

## 2021-05-16 DIAGNOSIS — N201 Calculus of ureter: Secondary | ICD-10-CM

## 2021-05-16 DIAGNOSIS — E119 Type 2 diabetes mellitus without complications: Secondary | ICD-10-CM

## 2021-05-16 DIAGNOSIS — K219 Gastro-esophageal reflux disease without esophagitis: Secondary | ICD-10-CM | POA: Insufficient documentation

## 2021-05-16 DIAGNOSIS — E039 Hypothyroidism, unspecified: Secondary | ICD-10-CM

## 2021-05-16 HISTORY — PX: CYSTOSCOPY/URETEROSCOPY/HOLMIUM LASER/STENT PLACEMENT: SHX6546

## 2021-05-16 LAB — CBC
HCT: 45.3 % (ref 36.0–46.0)
Hemoglobin: 15.3 g/dL — ABNORMAL HIGH (ref 12.0–15.0)
MCH: 29.7 pg (ref 26.0–34.0)
MCHC: 33.8 g/dL (ref 30.0–36.0)
MCV: 87.8 fL (ref 80.0–100.0)
Platelets: 267 10*3/uL (ref 150–400)
RBC: 5.16 MIL/uL — ABNORMAL HIGH (ref 3.87–5.11)
RDW: 13.2 % (ref 11.5–15.5)
WBC: 11.9 10*3/uL — ABNORMAL HIGH (ref 4.0–10.5)
nRBC: 0 % (ref 0.0–0.2)

## 2021-05-16 LAB — BASIC METABOLIC PANEL
Anion gap: 11 (ref 5–15)
BUN: 18 mg/dL (ref 6–20)
CO2: 23 mmol/L (ref 22–32)
Calcium: 9.3 mg/dL (ref 8.9–10.3)
Chloride: 105 mmol/L (ref 98–111)
Creatinine, Ser: 0.66 mg/dL (ref 0.44–1.00)
GFR, Estimated: >60 mL/min (ref >60)
Glucose, Bld: 96 mg/dL (ref 70–99)
Potassium: 4.9 mmol/L (ref 3.5–5.1)
Sodium: 139 mmol/L (ref 135–145)

## 2021-05-16 LAB — GLUCOSE, CAPILLARY
Glucose-Capillary: 107 mg/dL — ABNORMAL HIGH (ref 70–99)
Glucose-Capillary: 130 mg/dL — ABNORMAL HIGH (ref 70–99)

## 2021-05-16 SURGERY — CYSTOSCOPY/URETEROSCOPY/HOLMIUM LASER/STENT PLACEMENT
Anesthesia: General | Site: Ureter | Laterality: Right

## 2021-05-16 MED ORDER — DOCUSATE SODIUM 100 MG PO CAPS: 100.0000 mg | ORAL_CAPSULE | Freq: Every day | ORAL | 0 refills | Status: DC | PRN

## 2021-05-16 MED ORDER — MIDAZOLAM HCL 2 MG/2ML IJ SOLN
INTRAMUSCULAR | Status: AC
  Filled 2021-05-16: qty 2

## 2021-05-16 MED ORDER — LACTATED RINGERS IV SOLN: INTRAVENOUS | Status: DC

## 2021-05-16 MED ORDER — FENTANYL CITRATE PF 50 MCG/ML IJ SOSY: 25.0000 ug | PREFILLED_SYRINGE | INTRAMUSCULAR | Status: DC | PRN

## 2021-05-16 MED ORDER — MIDAZOLAM HCL 5 MG/5ML IJ SOLN
INTRAMUSCULAR | Status: DC | PRN
  Administered 2021-05-16: 2 mg via INTRAVENOUS

## 2021-05-16 MED ORDER — DEXAMETHASONE SODIUM PHOSPHATE 10 MG/ML IJ SOLN
INTRAMUSCULAR | Status: DC | PRN
  Administered 2021-05-16: 8 mg via INTRAVENOUS

## 2021-05-16 MED ORDER — SODIUM CHLORIDE 0.9 % IR SOLN
Status: DC | PRN
  Administered 2021-05-16: 3000 mL via INTRAVESICAL

## 2021-05-16 MED ORDER — ONDANSETRON HCL 4 MG/2ML IJ SOLN
INTRAMUSCULAR | Status: DC | PRN
  Administered 2021-05-16: 4 mg via INTRAVENOUS

## 2021-05-16 MED ORDER — AMISULPRIDE (ANTIEMETIC) 5 MG/2ML IV SOLN
INTRAVENOUS | Status: AC
  Administered 2021-05-16: 10 mg via INTRAVENOUS
  Filled 2021-05-16: qty 20

## 2021-05-16 MED ORDER — IOHEXOL 300 MG/ML  SOLN
INTRAMUSCULAR | Status: DC | PRN
  Administered 2021-05-16: 3 mL

## 2021-05-16 MED ORDER — FENTANYL CITRATE (PF) 100 MCG/2ML IJ SOLN
INTRAMUSCULAR | Status: AC
  Filled 2021-05-16: qty 2

## 2021-05-16 MED ORDER — ACETAMINOPHEN 500 MG PO TABS
1000.0000 mg | ORAL_TABLET | Freq: Once | ORAL | Status: AC
  Administered 2021-05-16: 1000 mg via ORAL
  Filled 2021-05-16: qty 2

## 2021-05-16 MED ORDER — OXYCODONE-ACETAMINOPHEN 5-325 MG PO TABS: 1.0000 | ORAL_TABLET | ORAL | 0 refills | Status: DC | PRN

## 2021-05-16 MED ORDER — CIPROFLOXACIN IN D5W 400 MG/200ML IV SOLN
400.0000 mg | Freq: Once | INTRAVENOUS | Status: AC
  Administered 2021-05-16: 400 mg via INTRAVENOUS
  Filled 2021-05-16: qty 200

## 2021-05-16 MED ORDER — CHLORHEXIDINE GLUCONATE 0.12 % MT SOLN
15.0000 mL | Freq: Once | OROMUCOSAL | Status: AC
  Administered 2021-05-16: 15 mL via OROMUCOSAL

## 2021-05-16 MED ORDER — PROPOFOL 10 MG/ML IV BOLUS
INTRAVENOUS | Status: DC | PRN
  Administered 2021-05-16: 200 mg via INTRAVENOUS

## 2021-05-16 MED ORDER — PROPOFOL 10 MG/ML IV BOLUS
INTRAVENOUS | Status: AC
  Filled 2021-05-16: qty 20

## 2021-05-16 MED ORDER — AMISULPRIDE (ANTIEMETIC) 5 MG/2ML IV SOLN: 10.0000 mg | Freq: Once | INTRAVENOUS | Status: AC | PRN

## 2021-05-16 MED ORDER — PHENYLEPHRINE 40 MCG/ML (10ML) SYRINGE FOR IV PUSH (FOR BLOOD PRESSURE SUPPORT)
PREFILLED_SYRINGE | INTRAVENOUS | Status: DC | PRN
  Administered 2021-05-16: 80 ug via INTRAVENOUS
  Administered 2021-05-16: 160 ug via INTRAVENOUS

## 2021-05-16 MED ORDER — FENTANYL CITRATE (PF) 100 MCG/2ML IJ SOLN
INTRAMUSCULAR | Status: DC | PRN
  Administered 2021-05-16: 100 ug via INTRAVENOUS

## 2021-05-16 MED ORDER — LIDOCAINE 2% (20 MG/ML) 5 ML SYRINGE
INTRAMUSCULAR | Status: DC | PRN
  Administered 2021-05-16: 60 mg via INTRAVENOUS

## 2021-05-16 SURGICAL SUPPLY — 19 items
BAG URO CATCHER STRL LF (MISCELLANEOUS) ×2 IMPLANT
BASKET ZERO TIP NITINOL 2.4FR (BASKET) ×1 IMPLANT
BSKT STON RTRVL ZERO TP 2.4FR (BASKET) ×1
CATH URETL OPEN 5X70 (CATHETERS) ×2 IMPLANT
CLOTH BEACON ORANGE TIMEOUT ST (SAFETY) ×2 IMPLANT
FIBER LASER MOSES 200 DFL (Laser) ×1 IMPLANT
GLOVE SURG ENC TEXT LTX SZ7 (GLOVE) ×2 IMPLANT
GUIDEWIRE STR DUAL SENSOR (WIRE) ×4 IMPLANT
GUIDEWIRE ZIPWRE .038 STRAIGHT (WIRE) IMPLANT
KIT TURNOVER KIT A (KITS) IMPLANT
LASER FIB FLEXIVA PULSE ID 365 (Laser) IMPLANT
MANIFOLD NEPTUNE II (INSTRUMENTS) ×2 IMPLANT
PACK CYSTO (CUSTOM PROCEDURE TRAY) ×2 IMPLANT
SHEATH URETERAL 12FR 45CM (SHEATH) IMPLANT
STENT URET 6FRX24 CONTOUR (STENTS) ×1 IMPLANT
TRACTIP FLEXIVA PULS ID 200XHI (Laser) IMPLANT
TRACTIP FLEXIVA PULSE ID 200 (Laser)
TUBING CONNECTING 10 (TUBING) ×2 IMPLANT
TUBING UROLOGY SET (TUBING) ×2 IMPLANT

## 2021-05-16 NOTE — Discharge Instructions (Addendum)
[facility] ?[phone] ?Post Ureteroscopy With or Without Stent Instructions ? ?Definitions: ? ?Ureter: The duct that transports urine from the kidney to the bladder. ?Stent:   A plastic hollow tube that is placed into the ureter, from the kidney to the bladder to prevent the ureter from swelling shut. ? ?GENERAL INSTRUCTIONS: ? ?Despite the fact that no skin incisions were used, the area around the ureter and bladder is raw and irritated. The stent is a foreign body which will further irritate the bladder wall. This irritation is manifested by increased frequency of urination, both day and night, and by an increase in the urge to urinate. In some, the urge to urinate is present almost always. Sometimes the urge is strong enough that you may not be able to stop yourself from urinating. The only real cure is to remove the stent and then give time for the bladder wall to heal which can't be done until the danger of the ureter swelling shut has passed, which varies. ? ?You may see some blood in your urine while the stent is in place and a few days afterwards. Do not be alarmed, even if the urine was clear for a while. Get off your feet and drink lots of fluids until clearing occurs. If you start to pass clots or don't improve, call us. ? ?DIET: ?You may return to your normal diet immediately. Because of the raw surface of your bladder, alcohol, spicy foods, acid type foods and drinks with caffeine may cause irritation or frequency and should be used in moderation. To keep your urine flowing freely and to avoid constipation, drink plenty of fluids during the day ( 8-10 glasses ). ?Tip: Avoid cranberry juice because it is very acidic. ? ?ACTIVITY: ?Your physical activity doesn't need to be restricted. However, if you are very active, you may see some blood in your urine. We suggest that you reduce your activity under these circumstances until the bleeding has stopped. ? ?BOWELS: ?It is important to  keep your bowels regular during the postoperative period. Straining with bowel movements can cause bleeding. A bowel movement every other day is reasonable. Use a mild laxative if needed, such as Milk of Magnesia 2-3 tablespoons, or 2 Dulcolax tablets. Call if you continue to have problems. If you have been taking narcotics for pain, before, during or after your surgery, you may be constipated. Take a laxative if necessary. ? ? ?MEDICATION: ?You should resume your pre-surgery medications unless told not to. In addition you will often be given an antibiotic to prevent infection. These should be taken as prescribed until the bottles are finished unless you are having an unusual reaction to one of the drugs. ? ?PROBLEMS YOU SHOULD REPORT TO [place]: ?Fevers over 100.5 Fahrenheit. ?Heavy bleeding, or clots ( See above notes about blood in urine ). ?Inability to urinate. ?Drug reactions ( hives, rash, nausea, vomiting, diarrhea ). ?Severe burning or pain with urination that is not improving. ? ?FOLLOW-UP: ?You will need a follow-up appointment to monitor your progress. Call for this appointment at the number listed above. Usually the first appointment will be about three to fourteen days after your surgery. ? ?Remove stent by pulling on attached string on Monday AM. ? ?

## 2021-05-16 NOTE — Anesthesia Preprocedure Evaluation (Addendum)
Anesthesia Evaluation  ? ? ?Reviewed: ?Allergy & Precautions, Patient's Chart, lab work & pertinent test results, reviewed documented beta blocker date and time  ? ?Airway ?Mallampati: II ? ?TM Distance: >3 FB ?Neck ROM: Full ? ? ? Dental ?no notable dental hx. ?(+) Teeth Intact, Dental Advisory Given ?  ?Pulmonary ?neg pulmonary ROS, former smoker,  ?  ?Pulmonary exam normal ?breath sounds clear to auscultation ? ? ? ? ? ? Cardiovascular ?hypertension, Pt. on home beta blockers and Pt. on medications ?Normal cardiovascular exam ?Rhythm:Regular Rate:Normal ? ?TTE 2019 ?- Left ventricle: The cavity size was normal. Wall thickness was  ???increased in a pattern of mild LVH. Systolic function was normal.  ???The estimated ejection fraction was in the range of 60% to 65%.  ???Wall motion was normal; there were no regional wall motion  ???abnormalities. Diastolic dysfunction, grade indeterminate.  ???Indeterminate filling pressures.  ?- Aortic valve: There was trivial regurgitation.  ?- Tricuspid valve: There was mild regurgitation.  ?- Pulmonary arteries: PA peak pressure: 32 mm Hg (S).  ?  ?Neuro/Psych ? Headaches, PSYCHIATRIC DISORDERS Anxiety Depression   ? GI/Hepatic ?Neg liver ROS, GERD  ,  ?Endo/Other  ?diabetesHypothyroidism  ? Renal/GU ?Renal disease (right ureteral stone)  ?negative genitourinary ?  ?Musculoskeletal ?negative musculoskeletal ROS ?(+)  ? Abdominal ?  ?Peds ? Hematology ?negative hematology ROS ?(+)   ?Anesthesia Other Findings ? ? Reproductive/Obstetrics ? ?  ? ? ? ? ? ? ? ? ? ? ? ? ? ?  ?  ? ? ? ? ? ? ? ?Anesthesia Physical ?Anesthesia Plan ? ?ASA: 3 ? ?Anesthesia Plan: General  ? ?Post-op Pain Management: Tylenol PO (pre-op)*  ? ?Induction: Intravenous ? ?PONV Risk Score and Plan: 3 and Ondansetron, Dexamethasone and Midazolam ? ?Airway Management Planned: LMA ? ?Additional Equipment:  ? ?Intra-op Plan:  ? ?Post-operative Plan: Extubation in OR ? ?Informed  Consent: I have reviewed the patients History and Physical, chart, labs and discussed the procedure including the risks, benefits and alternatives for the proposed anesthesia with the patient or authorized representative who has indicated his/her understanding and acceptance.  ? ? ? ?Dental advisory given ? ?Plan Discussed with: CRNA ? ?Anesthesia Plan Comments:   ? ? ? ? ? ? ?Anesthesia Quick Evaluation ? ?

## 2021-05-16 NOTE — H&P (Signed)
Office Visit Report     05/16/2021   --------------------------------------------------------------------------------   Abigail [name]  MRN: [id number]  DOB: [birth year], 56 year old Female  SSN: [id number]   PRIMARY CARE:  Alma Friendly, NP  REFERRING:  Quintella Reichert, MD  PROVIDER:  Festus Aloe, M.D.  TREATING:  Franchot Gallo, M.D.  LOCATION:  [facility] [phone]     --------------------------------------------------------------------------------   CC/HPI: Urgent work in for this woman with history of urolithiasis, right UPJ stone diagnosed last month without evidence of stone on KUB or renal ultrasound 4 days ago. Has worsened pain now. No fever but gross hematuria. Adamant that she wants his stone managed today.     ALLERGIES: Latex - Skin Rash penicillin    MEDICATIONS: Ditropan Xl 5 mg tablet, extended release 24 hr 1 tablet PO Daily  Flomax 0.4 mg capsule 1 capsule PO Q HS  Levothyroxine Sodium 50 mcg tablet  Metoprolol Tartrate 25 mg tablet  Percocet 5 mg-325 mg tablet 1 tablet PO Q 4 H PRN  Albuterol Sulfate  Aspirin Ec 81 mg tablet, delayed release  Esomeprazole Magnesium 40 mg capsule,delayed release  Fluoxetine Hcl 40 mg capsule  Ibuprofen 200 mg tablet  Linzess 72 mcg capsule  Ondansetron Hcl 4 mg tablet 1 tablet PO Q 8 H PRN  Rosuvastatin Calcium  Tramadol Hcl     GU PSH: ESWL - 2018     NON-GU PSH: Bilateral Tubal Ligation Exploratory Laparotomy Lip Surgery Procedure - 2002 Sinus surgery     GU PMH: Ureteral calculus, Given her pain distribution, her previously diagnosed right UPJ calculus is either at the UVJ or within the bladder and about to pass. - 05/12/2021, - 2018, - 2018, - 2018, - 2018 Renal calculus - 05/02/2021, - 08/24/2019 Flank Pain - 05/31/2020, - 2019, - 2019 (Chronic), Left, Ketorolac 60 mg IM today. KUB appears today to show excellent fragmentation of mid left ureteral calculus. No distal calcifications  noted. Reassured no overt hydronephrosis noted. It appears all stone fragments have passed and pain is most likely just bladder spasms and she can take OTC NSAID, AZO, push fluids, and stop Tamsulosin at this time. Instructed to f/u as scheduled and to bring stone fragments to this appt, - 2018 LLQ pain, Left - 05/31/2020, (Chronic), Left, Ketorolac 60 mg IM today, - 2018 Mixed incontinence - 2019 Nocturia - 2019 Urinary Frequency (Stable) - 2019, - 2019 Renal cyst - 2018    NON-GU PMH: Anxiety Arrhythmia Diabetes Type 2 Diverticulosis Fatty (change of) liver, not elsewhere classified GERD Major depressive disorder, recurrent, unspecified Migraine, unspecified, not intractable, without status migrainosus    FAMILY HISTORY: Cholelithiasis - Mother copd - Father Emphysema - Father   SOCIAL HISTORY: Marital Status: Divorced Preferred Language: English; Ethnicity: Not Hispanic Or Latino; Race: White Current Smoking Status: Patient does not smoke anymore. Has not smoked since 10/08/2006.   Tobacco Use Assessment Completed: Used Tobacco in last 30 days? Has never drank.  Drinks 1 caffeinated drink per day. Patient's occupation [employer] [place].    VITAL SIGNS: None   Complexity of Data:  Source Of History:  Patient  Records Review:   Previous Patient Records  Urine Test Review:   Urinalysis  X-Ray Review: KUB: Reviewed Films. KUB from 3/6 reviewed. I do not see anything resembling a stone in her lower ureteral area. C.T. Stone Protocol: Reviewed Films. CT from February reviewed.    PROCEDURES:         C.T.  Urogram - [id number]      Patient confirmed No Neulasta OnPro Device.         Urinalysis Dipstick Dipstick Cont'd  Color: Yellow Bilirubin: Neg mg/dL  Appearance: Clear Ketones: Neg mg/dL  Specific Gravity: 1.025 Blood: Neg ery/uL  pH: 5.5 Protein: Neg mg/dL  Glucose: Neg mg/dL Urobilinogen: 0.2 mg/dL    Nitrites: Neg    Leukocyte Esterase: Neg leu/uL          Ketoralac 31m - [id number], [id number]: 30 Adm. By: AKathleen Lime Unit: mg Lot No [id number] Route: IM Exp. Date 10/29/2021  Freq: None Mfgr.:   Site: Right Buttock   ASSESSMENT:      ICD-10 Details  1 GU:   Ureteral calculus - N20.1 Right, Chronic, Worsening - Right ureteral calculus at UVJ. Given time, this should pass. However, the patient is adamant about not wanting to wait and wanting a procedure done ASAP. She currently has no complicating factors.     PLAN:           Orders X-Rays: C.T. Stone Protocol Without I.V. Contrast  X-Ray Notes: History:  Hematuria: Yes/No  Patient to see MD after exam: Yes/No  Previous exam: CT / IVP/ US/ KUB/ None  When:  Where:  Diabetic: Yes/ No  BUN/ Creatinine:  Date of last BUN Creatinine:  Weight in pounds:  Allergy- IV Contrast: Yes/ No  Conflicting diabetic meds: Yes/ No  Diabetic Meds:  Prior Authorization #: NPCR [facility] #[id number]         Schedule         Document Letter(s):  Created for Patient: Clinical Summary         Notes:   I discussed ureteroscopic management with the patient as well as the fact that this may well passed because of its size and location. Saying that she is adamant about wanting to have this performed so she can get back to work, I will put her on the schedule to be done this evening with Dr. GAbner Greenspan She knows the procedure of ureteroscopy, possible holmium laser and extraction of stone as well as stent placement. We will get that scheduled.        Next Appointment:      Next Appointment: 06/02/2021 12:15 PM    Appointment Type: Postoperative Appointment    Location: [facility] -[phone]   Provider: MFestus Aloe M.D.    Reason for Visit: PO 2 WEEKS FROM URGENT CALL CASE ONLY WANTS TO SEE ESKRIDGE/ CYSTO,STNT, URS,LL       Signed by SFranchot Gallo M.D. on 05/16/21 at 3:34 PM (EST)  Urology Preoperative H&P   Chief Complaint: Right ureteral stone  History of  Present Illness: Abigail [name] a 56y.o. female with a right ureteral stone, now located at the right UVJ. She reports continued pain. She was seen by Dr. DDiona Fantitoday and added on for stone management. She denies fevers, chills, dysuria.    Past Medical History:  Diagnosis Date   Anxiety    Bulging lumbar disc    CAP (community acquired pneumonia)    Cardiac arrhythmia due to congenital heart disease    no arrhythmia identified    Celiac disease    Depression    Diverticulosis    Sigmoid colon   Fatty liver 05/2016   Frequent headaches    GERD (gastroesophageal reflux disease)    Heart disease    History of kidney stones  Hypertension    Kidney stones    Migraine    Obesity (BMI 35.0-39.9 without comorbidity)    UTI (urinary tract infection)     Past Surgical History:  Procedure Laterality Date   ABDOMINAL EXPLORATION SURGERY  1986   COLONOSCOPY  06/2016   ESOPHAGEAL MANOMETRY N/A 12/06/2019   Procedure: ESOPHAGEAL MANOMETRY (EM);  Surgeon: Mauri Pole, MD;  Location: [facility];  Service: Endoscopy;  Laterality: N/A;   EXTRACORPOREAL SHOCK WAVE LITHOTRIPSY Left 12/03/2016   Procedure: LEFT EXTRACORPOREAL SHOCK WAVE LITHOTRIPSY (ESWL);  Surgeon: Irine Seal, MD;  Location: [facility];  Service: Urology;  Laterality: Left;   KIDNEY STONE SURGERY     LEFT HEART CATH AND CORONARY ANGIOGRAPHY  07/28/2016    Large tortuous coronary arteries no radiographic evidence of disease.  Not anginal chest pain.  Normal EF.   LITHOTRIPSY     NASAL SINUS SURGERY     ROTATOR CUFF REPAIR Right 12/27/2017   TRANSTHORACIC ECHOCARDIOGRAM  06/2017   EF 60-65%.  GR 1-2 DD.  Otherwise normal echo.  Normal valve function.  Normal wall motion.   TUBAL LIGATION      Allergies:  Allergies  Allergen Reactions   Latex Rash   Penicillins Nausea Only    Has patient had a PCN reaction causing immediate rash, facial/tongue/throat swelling, SOB or lightheadedness with hypotension:  No Has patient had a PCN reaction causing severe rash involving mucus membranes or skin necrosis: No Has patient had a PCN reaction that required hospitalization: No Has patient had a PCN reaction occurring within the last 10 years: No If all of the above answers are "NO", then may proceed with Cephalosporin use.     Family History  Problem Relation Age of Onset   Gallstones Mother    COPD Father    Emphysema Father    Colon cancer Neg Hx    Stomach cancer Neg Hx    Esophageal cancer Neg Hx    Pancreatic cancer Neg Hx    Liver disease Neg Hx     Social History:  reports that she quit smoking about 15 years ago. Her smoking use included cigarettes. She has never used smokeless tobacco. She reports that she does not drink alcohol and does not use drugs.  ROS: A complete review of systems was performed.  All systems are negative except for pertinent findings as noted.  Physical Exam:  Vital signs in last 24 hours: Pulse Rate:  [63] (P) 63 (03/10 1535) Resp:  [18] (P) 18 (03/10 1535) BP: (P) 113/64 (03/10 1535) SpO2:  [100 %] (P) 100 % (03/10 1535) Constitutional:  Alert and oriented, No acute distress Cardiovascular: Regular rate and rhythm Respiratory: Normal respiratory effort, Lungs clear bilaterally GI: Abdomen is soft, nontender, nondistended, no abdominal masses GU: No CVA tenderness Lymphatic: No lymphadenopathy Neurologic: Grossly intact, no focal deficits Psychiatric: Normal mood and affect  Laboratory Data:  Recent Labs    05/16/21 1555  WBC 11.9*  HGB 15.3*  HCT 45.3  PLT 267    Recent Labs    05/16/21 1555  NA 139  K 4.9  CL 105  GLUCOSE 96  BUN 18  CALCIUM 9.3  CREATININE 0.66     Results for orders placed or performed during the hospital encounter of 05/16/21 (from the past 24 hour(s))  Glucose, capillary     Status: Abnormal   Collection Time: 05/16/21  3:44 PM  Result Value Ref Range   Glucose-Capillary 107 (H) 70 - 99  mg/dL   Comment 1  Notify RN   Basic Metabolic Panel     Status: None   Collection Time: 05/16/21  3:55 PM  Result Value Ref Range   Sodium 139 135 - 145 mmol/L   Potassium 4.9 3.5 - 5.1 mmol/L   Chloride 105 98 - 111 mmol/L   CO2 23 22 - 32 mmol/L   Glucose, Bld 96 70 - 99 mg/dL   BUN 18 6 - 20 mg/dL   Creatinine, Ser 0.66 0.44 - 1.00 mg/dL   Calcium 9.3 8.9 - 10.3 mg/dL   GFR, Estimated >60 >60 mL/min   Anion gap 11 5 - 15  CBC     Status: Abnormal   Collection Time: 05/16/21  3:55 PM  Result Value Ref Range   WBC 11.9 (H) 4.0 - 10.5 K/uL   RBC 5.16 (H) 3.87 - 5.11 MIL/uL   Hemoglobin 15.3 (H) 12.0 - 15.0 g/dL   HCT 45.3 36.0 - 46.0 %   MCV 87.8 80.0 - 100.0 fL   MCH 29.7 26.0 - 34.0 pg   MCHC 33.8 30.0 - 36.0 g/dL   RDW 13.2 11.5 - 15.5 %   Platelets 267 150 - 400 K/uL   nRBC 0.0 0.0 - 0.2 %   No results found for this or any previous visit (from the past 240 hour(s)).  Renal Function: Recent Labs    05/16/21 1555  CREATININE 0.66   CrCl cannot be calculated (Unknown ideal weight.).  Radiologic Imaging: No results found.  I independently reviewed the above imaging studies.  Assessment and Plan ERNESTENE COOVER is a 56 y.o. female with a right ureteral stone here for cysto, R RPG, R stent, possible R URS/basket extraction of stone.  -The risks, benefits and alternatives of cystoscopy with right JJ stent placement was discussed with the patient.  Risks include, but are not limited to: bleeding, urinary tract infection, ureteral injury, ureteral stricture disease, chronic pain, urinary symptoms, bladder injury, stent migration, the need for nephrostomy tube placement, MI, CVA, DVT, PE and the inherent risks with general anesthesia.  The patient voices understanding and wishes to proceed.    Matt R. Sharesa Kemp MD 05/16/2021, 4:26 PM  [facility] Pager: [phone]

## 2021-05-16 NOTE — Op Note (Signed)
Operative Note  Preoperative diagnosis:  1.  Right ureteral stone  Postoperative diagnosis: 1.  Right ureteral stone  Procedure(s): 1.  Cystoscopy 2. Right ureteroscopy with laser lithotripsy and basket extraction of stones 3. Right retrograde pyelogram 4. Right ureteral stent placement 5. Fluoroscopy with intraoperative interpretation  Surgeon: Rexene Alberts, MD  Assistants:  None  Anesthesia:  General  Complications:  None  EBL:  Minimal  Specimens: 1. Stones for stone analysis (to be done at [facility])  Drains/Catheters: 1.  Right 6Fr x 24cm ureteral stent WITH a tether string  Intraoperative findings:   Cystoscopy demonstrated no suspicious bladder lesions. Right ureteroscopy demonstrated approximately 80m right distal ureteral stone, successfully fragmented and basket extracted atraumatically in 3 pieces. Right retrograde pyelogram without hydronephrosis. Successful right ureteral stent placement.  Indication:  Abigail VANDALLis a 56y.o. female with a history of a right ureteral stone. CT A/P 05/16/2021 demonstrated 38mstone in the distal right ureter. She is here with persistent pain and for definitive stone management.  Description of procedure: After informed consent was obtained from the patient, the patient was identified and taken to the operating room and placed in the supine position.  General anesthesia was administered as well as perioperative IV antibiotics.  At the beginning of the case, a time-out was performed to properly identify the patient, the surgery to be performed, and the surgical site.  Sequential compression devices were applied to the lower extremities at the beginning of the case for DVT prophylaxis.  The patient was then placed in the dorsal lithotomy supine position, prepped and draped in sterile fashion.  Preliminary scout fluoroscopy revealed that there was a 29m76malcification area at the right distal ureter, which corresponds  to the stone found on the preoperative CT scan. We then passed the 21-French rigid cystoscope through the urethra and into the bladder under vision without any difficulty.  A systematic evaluation of the bladder revealed no evidence of any suspicious bladder lesions.  Ureteral orifices were in normal position.    Under cystoscopic and flouroscopic guidance, we cannulated the right ureteral orifice and passed a 0.038 sensor wire.   A semi-rigid ureteroscope was passed alongside the wire up the distal ureter which appeared normal. The calculus was identified at the distal right ureter approximately 1cm above the ureteral orifice. Using the 200 micron holmium laser fiber, the stone was fragmented into 3 small pieces. A 2.2 Fr zero tip basket was used to remove the fragments under visual guidance. These were sent for chemical analysis.  We encountered no further stones after passing the scope into the proximal ureter.  We then withdrew the ureteroscope back down the ureter noting no evidence of trauma. Once the ureteroscope was removed, we then used the Glidewire under fluoroscopic guidance and passed up a 6-French x 24 cm double-pigtail ureteral stent up the ureter, making sure that the proximal and distal ends coiled within the kidney and bladder respectively.  Note that we left a long tether string attached to the distal end of the ureteral stent and it exited the urethral meatus and was secured to the inner thigh with a tegaderm adhesive.  The cystoscope was then advanced back into the bladder under vision.  We were able to see the distal stent coiling nicely within the bladder.  The bladder was then emptied with irrigation solution.  The cystoscope was then removed.    The patient tolerated the procedure well and there was no complication. Patient was awoken  from anesthesia and taken to the recovery room in stable condition. I was present and scrubbed for the entirety of the case.  Plan:  Patient will  be discharged home.  She will remove the stent on Monday AM. She will f/u with Dr. Junious Silk as scheduled.   [facility]  Pager: [phone]

## 2021-05-16 NOTE — Transfer of Care (Signed)
Immediate Anesthesia Transfer of Care Note ? ?Patient: Abigail [name] ? ?Procedure(s) Performed: CYSTOSCOPY/URETEROSCOPY/HOLMIUM LASER/STENT PLACEMENT (Right: Ureter) ? ?Patient Location: PACU ? ?Anesthesia Type:General ? ?Level of Consciousness: drowsy and patient cooperative ? ?Airway & Oxygen Therapy: Patient Spontanous Breathing and Patient connected to face mask oxygen ? ?Post-op Assessment: Report given to RN and Post -op Vital signs reviewed and stable ? ?Post vital signs: Reviewed and stable ? ?Last Vitals:  ?Vitals Value Taken Time  ?BP 117/73 05/16/21 1755  ?Temp    ?Pulse 69 05/16/21 1758  ?Resp 11 05/16/21 1758  ?SpO2 99 % 05/16/21 1758  ?Vitals shown include unvalidated device data. ? ?Last Pain:  ?Vitals:  ? 05/16/21 1633  ?TempSrc: Oral  ?PainSc: 7   ?   ? ?Patients Stated Pain Goal: 0 (05/16/21 1633) ? ?Complications: No notable events documented. ?

## 2021-05-16 NOTE — Anesthesia Procedure Notes (Signed)
Procedure Name: LMA Insertion ?Date/Time: 05/16/2021 5:27 PM ?Performed by: Gean Maidens, CRNA ?Pre-anesthesia Checklist: Patient identified, Emergency Drugs available, Suction available, Patient being monitored and Timeout performed ?Patient Re-evaluated:Patient Re-evaluated prior to induction ?Oxygen Delivery Method: Circle system utilized ?Preoxygenation: Pre-oxygenation with 100% oxygen ?Induction Type: IV induction ?Ventilation: Mask ventilation without difficulty ?LMA: LMA inserted ?LMA Size: 4.0 ?Number of attempts: 1 ?Placement Confirmation: positive ETCO2 and breath sounds checked- equal and bilateral ?Tube secured with: Tape ?Dental Injury: Teeth and Oropharynx as per pre-operative assessment  ? ? ? ? ?

## 2021-05-17 ENCOUNTER — Encounter (HOSPITAL_COMMUNITY): Payer: Self-pay | Admitting: Urology

## 2021-05-17 NOTE — Anesthesia Postprocedure Evaluation (Signed)
Anesthesia Post Note ? ?Patient: Abigail [name] ? ?Procedure(s) Performed: CYSTOSCOPY/URETEROSCOPY/HOLMIUM LASER/STENT PLACEMENT (Right: Ureter) ? ?  ? ?Patient location during evaluation: PACU ?Anesthesia Type: General ?Level of consciousness: awake and alert ?Pain management: pain level controlled ?Vital Signs Assessment: post-procedure vital signs reviewed and stable ?Respiratory status: spontaneous breathing, nonlabored ventilation, respiratory function stable and patient connected to nasal cannula oxygen ?Cardiovascular status: blood pressure returned to baseline and stable ?Postop Assessment: no apparent nausea or vomiting ?Anesthetic complications: no ? ? ?No notable events documented. ? ?Last Vitals:  ?Vitals:  ? 05/16/21 1815 05/16/21 1903  ?BP: 109/63 129/68  ?Pulse: 64 68  ?Resp: 10 14  ?Temp:    ?SpO2: 95% 95%  ?  ?Last Pain:  ?Vitals:  ? 05/16/21 1903  ?TempSrc:   ?PainSc: 0-No pain  ? ? ?  ?  ?  ?  ?  ?  ? ?Abigail [name] ? ? ? ? ?

## 2021-05-18 ENCOUNTER — Other Ambulatory Visit: Payer: Self-pay | Admitting: Primary Care

## 2021-05-18 DIAGNOSIS — E559 Vitamin D deficiency, unspecified: Secondary | ICD-10-CM

## 2021-05-23 ENCOUNTER — Encounter: Payer: Self-pay | Admitting: Primary Care

## 2021-05-23 ENCOUNTER — Ambulatory Visit (INDEPENDENT_AMBULATORY_CARE_PROVIDER_SITE_OTHER): Payer: 59 | Admitting: Primary Care

## 2021-05-23 ENCOUNTER — Other Ambulatory Visit: Payer: Self-pay

## 2021-05-23 ENCOUNTER — Other Ambulatory Visit: Payer: Self-pay | Admitting: Primary Care

## 2021-05-23 VITALS — BP 118/76 | HR 58 | Temp 97.8°F | Ht 62.0 in | Wt 174.0 lb

## 2021-05-23 DIAGNOSIS — E559 Vitamin D deficiency, unspecified: Secondary | ICD-10-CM

## 2021-05-23 DIAGNOSIS — E039 Hypothyroidism, unspecified: Secondary | ICD-10-CM | POA: Diagnosis not present

## 2021-05-23 DIAGNOSIS — K5909 Other constipation: Secondary | ICD-10-CM

## 2021-05-23 DIAGNOSIS — N3281 Overactive bladder: Secondary | ICD-10-CM

## 2021-05-23 DIAGNOSIS — Z0000 Encounter for general adult medical examination without abnormal findings: Secondary | ICD-10-CM

## 2021-05-23 DIAGNOSIS — Z23 Encounter for immunization: Secondary | ICD-10-CM | POA: Diagnosis not present

## 2021-05-23 DIAGNOSIS — E1169 Type 2 diabetes mellitus with other specified complication: Secondary | ICD-10-CM | POA: Diagnosis not present

## 2021-05-23 DIAGNOSIS — N200 Calculus of kidney: Secondary | ICD-10-CM | POA: Diagnosis not present

## 2021-05-23 DIAGNOSIS — F419 Anxiety disorder, unspecified: Secondary | ICD-10-CM

## 2021-05-23 DIAGNOSIS — E785 Hyperlipidemia, unspecified: Secondary | ICD-10-CM | POA: Diagnosis not present

## 2021-05-23 DIAGNOSIS — R002 Palpitations: Secondary | ICD-10-CM

## 2021-05-23 DIAGNOSIS — F32A Depression, unspecified: Secondary | ICD-10-CM

## 2021-05-23 DIAGNOSIS — R29818 Other symptoms and signs involving the nervous system: Secondary | ICD-10-CM

## 2021-05-23 DIAGNOSIS — E1165 Type 2 diabetes mellitus with hyperglycemia: Secondary | ICD-10-CM

## 2021-05-23 DIAGNOSIS — K219 Gastro-esophageal reflux disease without esophagitis: Secondary | ICD-10-CM

## 2021-05-23 DIAGNOSIS — G8929 Other chronic pain: Secondary | ICD-10-CM

## 2021-05-23 DIAGNOSIS — M545 Low back pain, unspecified: Secondary | ICD-10-CM

## 2021-05-23 LAB — VITAMIN D 25 HYDROXY (VIT D DEFICIENCY, FRACTURES): VITD: 60.3 ng/mL (ref 30.00–100.00)

## 2021-05-23 LAB — CBC
HCT: 40.8 % (ref 36.0–46.0)
Hemoglobin: 13.5 g/dL (ref 12.0–15.0)
MCHC: 33.1 g/dL (ref 30.0–36.0)
MCV: 87.4 fl (ref 78.0–100.0)
Platelets: 276 10*3/uL (ref 150.0–400.0)
RBC: 4.67 Mil/uL (ref 3.87–5.11)
RDW: 14.2 % (ref 11.5–15.5)
WBC: 11.3 10*3/uL — ABNORMAL HIGH (ref 4.0–10.5)

## 2021-05-23 LAB — LIPID PANEL
Cholesterol: 152 mg/dL (ref 0–200)
HDL: 54.4 mg/dL (ref >39.00)
LDL Cholesterol: 75 mg/dL (ref 0–99)
NonHDL: 97.73
Total CHOL/HDL Ratio: 3
Triglycerides: 112 mg/dL (ref 0.0–149.0)
VLDL: 22.4 mg/dL (ref 0.0–40.0)

## 2021-05-23 LAB — COMPREHENSIVE METABOLIC PANEL
ALT: 39 U/L — ABNORMAL HIGH (ref 0–35)
AST: 22 U/L (ref 0–37)
Albumin: 4.3 g/dL (ref 3.5–5.2)
Alkaline Phosphatase: 91 U/L (ref 39–117)
BUN: 14 mg/dL (ref 6–23)
CO2: 30 mEq/L (ref 19–32)
Calcium: 9.7 mg/dL (ref 8.4–10.5)
Chloride: 106 mEq/L (ref 96–112)
Creatinine, Ser: 0.67 mg/dL (ref 0.40–1.20)
GFR: 97.74 mL/min (ref >60.00)
Glucose, Bld: 95 mg/dL (ref 70–99)
Potassium: 4.6 mEq/L (ref 3.5–5.1)
Sodium: 143 mEq/L (ref 135–145)
Total Bilirubin: 1.1 mg/dL (ref 0.2–1.2)
Total Protein: 6.6 g/dL (ref 6.0–8.3)

## 2021-05-23 LAB — TSH: TSH: 1.74 u[IU]/mL (ref 0.35–5.50)

## 2021-05-23 LAB — HEMOGLOBIN A1C: Hgb A1c MFr Bld: 5.8 % (ref 4.6–6.5)

## 2021-05-23 NOTE — Assessment & Plan Note (Signed)
Following with GI. ? ?Continue Linzess 145 mcg HS. ? ?

## 2021-05-23 NOTE — Addendum Note (Signed)
Addended by: Francella Solian on: 05/23/2021 10:43 AM ? ? Modules accepted: Orders ? ?

## 2021-05-23 NOTE — Assessment & Plan Note (Signed)
Repeat lipid panel pending. ? ?Continue rosuvastatin 20 mg daily. ?

## 2021-05-23 NOTE — Assessment & Plan Note (Signed)
Repeat A1c pending. ? ?Continue off of medications for now. ? ? ?

## 2021-05-23 NOTE — Assessment & Plan Note (Signed)
Following with Urology. ? ?Continue oxybutynin XL 5 mg.  ?

## 2021-05-23 NOTE — Assessment & Plan Note (Signed)
Repeat vitamin D level pending. ? ?Continue 50,000 IU weekly for now. ?

## 2021-05-23 NOTE — Progress Notes (Signed)
? ?Subjective:  ? ? Patient ID: Abigail [name], female    DOB: [birth year], 56 y.o.   MRN: [id number] ? ?HPI ? ?Abigail [name] is a very pleasant 56 y.o. female who presents today for complete physical and follow up of chronic conditions. ? ?Immunizations: ?-Tetanus: Unknown, over 10 years  ?-Influenza: Completed this season  ?-Covid-19: 2 vaccines ?-Shingles: Never completed ? ?Diet: Fair diet.  ?Exercise: No regular exercise. ? ?Eye exam: Completes annually  ?Dental exam: Completes semi-annually  ? ?Pap Smear: Completed in 2022 ?Mammogram: Completed through the [facility] in October 2022 ?Colonoscopy: Completed in 2018, due 2023. Patient was not aware.  ? ?BP Readings from Last 3 Encounters:  ?05/23/21 118/76  ?05/16/21 129/68  ?04/07/21 120/70  ? ? ? ? ? ?Review of Systems  ?Constitutional:  Negative for unexpected weight change.  ?HENT:  Negative for rhinorrhea.   ?Respiratory:  Negative for cough and shortness of breath.   ?Cardiovascular:  Negative for chest pain.  ?Gastrointestinal:  Negative for constipation and diarrhea.  ?Genitourinary:  Negative for difficulty urinating.  ?Musculoskeletal:  Positive for arthralgias and back pain.  ?Skin:  Negative for rash.  ?Allergic/Immunologic: Negative for environmental allergies.  ?Neurological:  Negative for dizziness and headaches.  ?Psychiatric/Behavioral:  The patient is not nervous/anxious.   ? ?   ? ? ?Past Medical History:  ?Diagnosis Date  ? Anxiety   ? Bulging lumbar disc   ? CAP (community acquired pneumonia)   ? Cardiac arrhythmia due to congenital heart disease   ? no arrhythmia identified   ? Celiac disease   ? Depression   ? Diverticulosis   ? Sigmoid colon  ? Fatty liver 05/2016  ? Frequent headaches   ? GERD (gastroesophageal reflux disease)   ? Heart disease   ? History of kidney stones   ? Hypertension   ? Kidney stones   ? Migraine   ? Obesity (BMI 35.0-39.9 without comorbidity)   ? UTI (urinary tract infection)   ? ? ?Social History   ? ?Socioeconomic History  ? Marital status: Single  ?  Spouse name: Not on file  ? Number of children: Not on file  ? Years of education: Not on file  ? Highest education level: Not on file  ?Occupational History  ? Not on file  ?Tobacco Use  ? Smoking status: Former  ?  Types: Cigarettes  ?  Quit date: 2008  ?  Years since quitting: 15.2  ? Smokeless tobacco: Never  ? Tobacco comments:  ?  used tobacco "socially"  ?Vaping Use  ? Vaping Use: Never used  ?Substance and Sexual Activity  ? Alcohol use: No  ?  Alcohol/week: 0.0 standard drinks  ? Drug use: No  ? Sexual activity: Not on file  ?Other Topics Concern  ? Not on file  ?Social History Narrative  ? Single.  ? Has a set of Twins, age 28.  ? Works for the [employer] and [employer] and [employer]  ? Enjoys relaxing, spending time with her children.  ? ?Social Determinants of Health  ? ?Financial Resource Strain: Not on file  ?Food Insecurity: Not on file  ?Transportation Needs: Not on file  ?Physical Activity: Not on file  ?Stress: Not on file  ?Social Connections: Not on file  ?Intimate Partner Violence: Not on file  ? ? ?Past Surgical History:  ?Procedure Laterality Date  ? ABDOMINAL EXPLORATION SURGERY  1986  ? COLONOSCOPY  06/2016  ? CYSTOSCOPY/URETEROSCOPY/HOLMIUM LASER/STENT PLACEMENT  Right 05/16/2021  ? Procedure: CYSTOSCOPY/URETEROSCOPY/HOLMIUM LASER/STENT PLACEMENT;  Surgeon: Janith Lima, MD;  Location: [facility];  Service: Urology;  Laterality: Right;  ? ESOPHAGEAL MANOMETRY N/A 12/06/2019  ? Procedure: ESOPHAGEAL MANOMETRY (EM);  Surgeon: Mauri Pole, MD;  Location: [facility];  Service: Endoscopy;  Laterality: N/A;  ? EXTRACORPOREAL SHOCK WAVE LITHOTRIPSY Left 12/03/2016  ? Procedure: LEFT EXTRACORPOREAL SHOCK WAVE LITHOTRIPSY (ESWL);  Surgeon: Irine Seal, MD;  Location: [facility];  Service: Urology;  Laterality: Left;  ? KIDNEY STONE SURGERY    ? LEFT HEART CATH AND CORONARY ANGIOGRAPHY  07/28/2016  ?  Large tortuous coronary arteries no  radiographic evidence of disease.  Not anginal chest pain.  Normal EF.  ? LITHOTRIPSY    ? NASAL SINUS SURGERY    ? ROTATOR CUFF REPAIR Right 12/27/2017  ? TRANSTHORACIC ECHOCARDIOGRAM  06/2017  ? EF 60-65%.  GR 1-2 DD.  Otherwise normal echo.  Normal valve function.  Normal wall motion.  ? TUBAL LIGATION    ? ? ?Family History  ?Problem Relation Age of Onset  ? Gallstones Mother   ? COPD Father   ? Emphysema Father   ? Colon cancer Neg Hx   ? Stomach cancer Neg Hx   ? Esophageal cancer Neg Hx   ? Pancreatic cancer Neg Hx   ? Liver disease Neg Hx   ? ? ?Allergies  ?Allergen Reactions  ? Latex Rash  ? Penicillins Nausea Only  ?  Has patient had a PCN reaction causing immediate rash, facial/tongue/throat swelling, SOB or lightheadedness with hypotension: No ?Has patient had a PCN reaction causing severe rash involving mucus membranes or skin necrosis: No ?Has patient had a PCN reaction that required hospitalization: No ?Has patient had a PCN reaction occurring within the last 10 years: No ?If all of the above answers are "NO", then may proceed with Cephalosporin use. ?  ? ? ?Current Outpatient Medications on File Prior to Visit  ?Medication Sig Dispense Refill  ? Accu-Chek FastClix Lancets MISC USE 1 LANCET TO CHECK BLOOD SUGAR THREE TIMES DAILY 102 each 5  ? ADVIL 200 MG tablet Take 200-600 mg by mouth every 6 (six) hours as needed for mild pain or headache.    ? albuterol (PROAIR HFA) 108 (90 Base) MCG/ACT inhaler Inhale 2 puffs into the lungs every 6 (six) hours as needed for wheezing or shortness of breath. 8 g 1  ? Blood Glucose Monitoring Suppl (ACCU-CHEK GUIDE) w/Device KIT by Does not apply route.    ? docusate sodium (COLACE) 100 MG capsule Take 1 capsule (100 mg total) by mouth daily as needed for up to 30 doses. 30 capsule 0  ? esomeprazole (NEXIUM) 40 MG capsule TAKE 1 CAPSULE BY MOUTH DAILY (Patient taking differently: As needed) 90 capsule 1  ? FLUoxetine (PROZAC) 40 MG capsule Take 1 capsule (40 mg  total) by mouth daily. For anxiety (Patient taking differently: Take 40 mg by mouth in the morning.) 90 capsule 3  ? HYDROcodone-acetaminophen (NORCO/VICODIN) 5-325 MG tablet Take 1 tablet by mouth every 4 (four) hours as needed.    ? hydrOXYzine (ATARAX/VISTARIL) 10 MG tablet Take 1 to 2 tablets by mouth twice daily as needed for anxiety. 30 tablet 0  ? Lancets Misc. (ACCU-CHEK FASTCLIX LANCET) KIT Use as directed to test blood sugar daily 1 kit 0  ? levothyroxine (SYNTHROID) 75 MCG tablet Take 1 tablet by mouth every morning on an empty stomach with water only.  No food or other  medications for 30 minutes. (Patient taking differently: Take 75 mcg by mouth See admin instructions. Take 75 mcg by mouth every morning on an empty stomach with water only, with no food or other medications for 30 minutes) 90 tablet 0  ? linaclotide (LINZESS) 145 MCG CAPS capsule TAKE 1 CAPSULE BY MOUTH ONCE DAILY BEFORE BREAKFAST (Patient taking differently: Take 145 mcg by mouth at bedtime.) 90 capsule 3  ? metoprolol tartrate (LOPRESSOR) 25 MG tablet Take 1 tablet by mouth twice daily (Patient taking differently: Take 25 mg by mouth 2 (two) times daily.) 60 tablet 0  ? ondansetron (ZOFRAN) 4 MG tablet Take 4 mg by mouth every 8 (eight) hours as needed for vomiting or nausea.    ? oxybutynin (DITROPAN-XL) 5 MG 24 hr tablet Take 5 mg by mouth daily.  1  ? rosuvastatin (CRESTOR) 20 MG tablet Take 1 tablet (20 mg total) by mouth daily. 90 tablet 3  ? tamsulosin (FLOMAX) 0.4 MG CAPS capsule Take 0.4 mg by mouth at bedtime.    ? Cholecalciferol (VITAMIN D3) 1.25 MG (50000 UT) CAPS Take 1 capsule by mouth once a week (Patient not taking: Reported on 05/23/2021) 12 capsule 0  ? ?No current facility-administered medications on file prior to visit.  ? ? ?BP 118/76   Pulse (!) 58   Temp 97.8 ?F (36.6 ?C) (Oral)   Ht 5' 2"  (1.575 m)   Wt 174 lb (78.9 kg)   LMP 08/12/2015 Comment: patient states hx. tubal ligation  SpO2 97%   BMI 31.83 kg/m?   ?Objective:  ? Physical Exam ?HENT:  ?   Right Ear: Tympanic membrane and ear canal normal.  ?   Left Ear: Tympanic membrane and ear canal normal.  ?   Nose: Nose normal.  ?Eyes:  ?   Conjunctiva/sclera: Conjunctivae norma

## 2021-05-23 NOTE — Assessment & Plan Note (Signed)
Controlled.  ?Continue fluoxetine 40 mg daily. ?

## 2021-05-23 NOTE — Assessment & Plan Note (Signed)
Controlled. ? ?Continue Nexium 40 mg PRN. ?She is working on weight loss and dietary changes.  ?

## 2021-05-23 NOTE — Assessment & Plan Note (Signed)
Has connected with pulmonology, has yet to complete sleep study. She plans on doing this soon. ? ?Strongly encouraged she proceed. ?

## 2021-05-23 NOTE — Assessment & Plan Note (Signed)
Tetanus due, updated today. Shingrix due, she declines today.   ? ?Pap smear UTD. ?Mammogram UTD per patient, will try to obtain records. ?Colonoscopy due now, discussed this with patient today. She will speak with GI. ? ?Discussed the importance of a healthy diet and regular exercise in order for weight loss, and to reduce the risk of further co-morbidity. ? ?Exam today stable.  ?Labs pending.  ?

## 2021-05-23 NOTE — Assessment & Plan Note (Signed)
Controlled. ?Following with cardiology. ? ?Continue metoprolol 25 mg BID.  ?

## 2021-05-23 NOTE — Assessment & Plan Note (Signed)
She is taking levothyroxine 75 mcg correctly.  ?Repeat TSH pending. ? ?Continue 75 mcg daily.  ?

## 2021-05-23 NOTE — Assessment & Plan Note (Addendum)
Chronic history, recent episode last week. ?Following with Urology.  ? ?Continue Flomax 0.4 mg daily. ?

## 2021-05-23 NOTE — Assessment & Plan Note (Signed)
Following with spine specialist. ? ?Continue hydrocodone-acetaminophen 5-325 mg as needed. ?

## 2021-05-23 NOTE — Patient Instructions (Signed)
Stop by the lab prior to leaving today. I will notify you of your results once received.  ? ?According to my records, you are due for your colonoscopy.  Please contact your GI doctor. ? ?Complete the sleep study as soon as possible. ? ?It was a pleasure to see you today! ? ?Preventive Care 35-57 Years Old, Female ?Preventive care refers to lifestyle choices and visits with your health care provider that can promote health and wellness. Preventive care visits are also called wellness exams. ?What can I expect for my preventive care visit? ?Counseling ?Your health care provider may ask you questions about your: ?Medical history, including: ?Past medical problems. ?Family medical history. ?Pregnancy history. ?Current health, including: ?Menstrual cycle. ?Method of birth control. ?Emotional well-being. ?Home life and relationship well-being. ?Sexual activity and sexual health. ?Lifestyle, including: ?Alcohol, nicotine or tobacco, and drug use. ?Access to firearms. ?Diet, exercise, and sleep habits. ?Work and work Statistician. ?Sunscreen use. ?Safety issues such as seatbelt and bike helmet use. ?Physical exam ?Your health care provider will check your: ?Height and weight. These may be used to calculate your BMI (body mass index). BMI is a measurement that tells if you are at a healthy weight. ?Waist circumference. This measures the distance around your waistline. This measurement also tells if you are at a healthy weight and may help predict your risk of certain diseases, such as type 2 diabetes and high blood pressure. ?Heart rate and blood pressure. ?Body temperature. ?Skin for abnormal spots. ?What immunizations do I need? ?Vaccines are usually given at various ages, according to a schedule. Your health care provider will recommend vaccines for you based on your age, medical history, and lifestyle or other factors, such as travel or where you work. ?What tests do I need? ?Screening ?Your health care provider may  recommend screening tests for certain conditions. This may include: ?Lipid and cholesterol levels. ?Diabetes screening. This is done by checking your blood sugar (glucose) after you have not eaten for a while (fasting). ?Pelvic exam and Pap test. ?Hepatitis B test. ?Hepatitis C test. ?HIV (human immunodeficiency virus) test. ?STI (sexually transmitted infection) testing, if you are at risk. ?Lung cancer screening. ?Colorectal cancer screening. ?Mammogram. Talk with your health care provider about when you should start having regular mammograms. This may depend on whether you have a family history of breast cancer. ?BRCA-related cancer screening. This may be done if you have a family history of breast, ovarian, tubal, or peritoneal cancers. ?Bone density scan. This is done to screen for osteoporosis. ?Talk with your health care provider about your test results, treatment options, and if necessary, the need for more tests. ?Follow these instructions at home: ?Eating and drinking ? ?Eat a diet that includes fresh fruits and vegetables, whole grains, lean protein, and low-fat dairy products. ?Take vitamin and mineral supplements as recommended by your health care provider. ?Do not drink alcohol if: ?Your health care provider tells you not to drink. ?You are pregnant, may be pregnant, or are planning to become pregnant. ?If you drink alcohol: ?Limit how much you have to 0-1 drink a day. ?Know how much alcohol is in your drink. In the [place] one drink equals one 12 oz bottle of beer (355 mL), one 5 oz glass of wine (148 mL), or one 1? oz glass of hard liquor (44 mL). ?Lifestyle ?Brush your teeth every morning and night with fluoride toothpaste. Floss one time each day. ?Exercise for at least 30 minutes 5 or more days each week. ?  Do not use any products that contain nicotine or tobacco. These products include cigarettes, chewing tobacco, and vaping devices, such as e-cigarettes. If you need help quitting, ask your health  care provider. ?Do not use drugs. ?If you are sexually active, practice safe sex. Use a condom or other form of protection to prevent STIs. ?If you do not wish to become pregnant, use a form of birth control. If you plan to become pregnant, see your health care provider for a prepregnancy visit. ?Take aspirin only as told by your health care provider. Make sure that you understand how much to take and what form to take. Work with your health care provider to find out whether it is safe and beneficial for you to take aspirin daily. ?Find healthy ways to manage stress, such as: ?Meditation, yoga, or listening to music. ?Journaling. ?Talking to a trusted person. ?Spending time with friends and family. ?Minimize exposure to UV radiation to reduce your risk of skin cancer. ?Safety ?Always wear your seat belt while driving or riding in a vehicle. ?Do not drive: ?If you have been drinking alcohol. Do not ride with someone who has been drinking. ?When you are tired or distracted. ?While texting. ?If you have been using any mind-altering substances or drugs. ?Wear a helmet and other protective equipment during sports activities. ?If you have firearms in your house, make sure you follow all gun safety procedures. ?Seek help if you have been physically or sexually abused. ?What's next? ?Visit your health care provider once a year for an annual wellness visit. ?Ask your health care provider how often you should have your eyes and teeth checked. ?Stay up to date on all vaccines. ?This information is not intended to replace advice given to you by your health care provider. Make sure you discuss any questions you have with your health care provider. ?Document Revised: 08/21/2020 Document Reviewed: 08/21/2020 ?Elsevier Patient Education ? [place]. ? ?

## 2021-05-30 ENCOUNTER — Other Ambulatory Visit: Payer: Self-pay | Admitting: Cardiology

## 2021-06-24 ENCOUNTER — Telehealth: Payer: Self-pay | Admitting: Primary Care

## 2021-06-24 DIAGNOSIS — E1165 Type 2 diabetes mellitus with hyperglycemia: Secondary | ICD-10-CM

## 2021-06-24 MED ORDER — ACCU-CHEK FASTCLIX LANCETS MISC: 1 refills | Status: AC

## 2021-06-24 NOTE — Telephone Encounter (Signed)
Pt wants to speak to the nurse.  ? ?Best Callback Number: [phone] ?

## 2021-06-24 NOTE — Telephone Encounter (Signed)
Noted, refill sent to pharmacy. 

## 2021-06-24 NOTE — Telephone Encounter (Signed)
Called patient stats she needs refill of lancets. Verified that she is still using the Accu-Chek  ?

## 2021-07-02 ENCOUNTER — Other Ambulatory Visit: Payer: Self-pay | Admitting: Cardiology

## 2021-07-08 ENCOUNTER — Other Ambulatory Visit: Payer: Self-pay | Admitting: Primary Care

## 2021-07-08 DIAGNOSIS — F419 Anxiety disorder, unspecified: Secondary | ICD-10-CM

## 2021-07-14 ENCOUNTER — Ambulatory Visit (INDEPENDENT_AMBULATORY_CARE_PROVIDER_SITE_OTHER): Payer: 59 | Admitting: Family Medicine

## 2021-07-14 ENCOUNTER — Encounter: Payer: Self-pay | Admitting: Family Medicine

## 2021-07-14 VITALS — BP 100/60 | HR 59 | Temp 98.2°F | Ht 62.0 in | Wt 178.1 lb

## 2021-07-14 DIAGNOSIS — W57XXXA Bitten or stung by nonvenomous insect and other nonvenomous arthropods, initial encounter: Secondary | ICD-10-CM

## 2021-07-14 DIAGNOSIS — R110 Nausea: Secondary | ICD-10-CM

## 2021-07-14 DIAGNOSIS — M791 Myalgia, unspecified site: Secondary | ICD-10-CM

## 2021-07-14 DIAGNOSIS — R5383 Other fatigue: Secondary | ICD-10-CM | POA: Diagnosis not present

## 2021-07-14 MED ORDER — DOXYCYCLINE HYCLATE 100 MG PO TABS: 100.0000 mg | ORAL_TABLET | Freq: Two times a day (BID) | ORAL | 0 refills | Status: DC

## 2021-07-14 MED ORDER — FLUCONAZOLE 150 MG PO TABS: ORAL_TABLET | ORAL | 0 refills | Status: DC

## 2021-07-14 MED ORDER — ONDANSETRON HCL 4 MG PO TABS: 4.0000 mg | ORAL_TABLET | Freq: Three times a day (TID) | ORAL | 0 refills | Status: DC | PRN

## 2021-07-15 NOTE — Progress Notes (Signed)
? ? ?Abigail Magnan T. Ralf Konopka, MD, [name] ?Therapist, music at [facility] ?[place] ?Abigail [name], [address] ? ?Phone: [phone]  FAX: [phone] ? ?Abigail [name] - 56 y.o. female  MRN [id number]  Date of Birth: 1965-07-05 ? ?Date: 07/14/2021  PCP: [name], NP  Referral: [name], NP ? ?Chief Complaint  ?Patient presents with  ? Insect Bite  ?  Tick Bite-Pulled off tick yesterday and some of it is stick in her back  ? Nausea  ?  And Clammy Feeling  ? ? ?This visit occurred during the SARS-CoV-2 public health emergency.  Safety protocols were in place, including screening questions prior to the visit, additional usage of staff PPE, and extensive cleaning of exam room while observing appropriate contact time as indicated for disinfecting solutions.  ? ?Subjective:  ? ?Abigail [name] is a 56 y.o. very pleasant female patient with Body mass index is 32.57 kg/m?. who presents with the following: ? ?Patient is a pleasant lady, she presents with a tick that was on her back for an unknown length of time.  This tick was removed yesterday. ? ?She does think that she has some retained matter from the tick on the back. ? ?She also has sensations of nausea, general feeling of unwell, clammy sensation, some achiness. ? ?She denies fever or chills.  No other acute illnesses, respiratory symptoms, diarrhea. ? ?Review of Systems is noted in the HPI, as appropriate ? ?Objective:  ? ?BP 100/60   Pulse (!) 59   Temp 98.2 ?F (36.8 ?C) (Oral)   Ht 5' 2"  (1.575 m)   Wt 178 lb 1 oz (80.8 kg)   LMP 08/12/2015 Comment: patient states hx. tubal ligation  SpO2 96%   BMI 32.57 kg/m?  ? ?GEN: No acute distress; alert,appropriate. ?PULM: Breathing comfortably in no respiratory distress ?PSYCH: Normally interactive.  ?CV: RRR, no m/g/r  ? ?She does have evidence of a lesion with some excoriation around a darkened central area.  She also has a a tick that she shows me in a  bag.  Minimally tender surrounding this.  It is not warm to touch. ? ?Remainder of physical exam is unremarkable. ? ?Laboratory and Imaging Data: ? ?Assessment and Plan:  ? ?  ICD-10-CM   ?1. Myalgia  M79.10   ?  ?2. Other fatigue  R53.83   ?  ?3. Tick bite, initial encounter  W57.Merril Abbe   ?  ?4. Nausea without vomiting  R11.0   ?  ? ?Global symptoms in the setting of tick bite.  Think that one has to assume that the patient has potential tickborne illness and treat as such with some doxycycline. ? ?Tick removal: Indication, retained tick ?Patient was prepped with ChloraPrep.  Plain pickups and a #15 blade were used to remove the central area that was darkened in nature.  Possible tic versus scab formation.  I do not clearly see any retained tick material, however this was removed until all darkened material was gone and there was minor bleeding. ? ?Medication Management during today's office visit: ? ?New prescription management for evaluated conditions today and refills : ?Meds ordered this encounter  ?Medications  ? doxycycline (VIBRA-TABS) 100 MG tablet  ?  Sig: Take 1 tablet (100 mg total) by mouth 2 (two) times daily.  ?  Dispense:  20 tablet  ?  Refill:  0  ? ondansetron (ZOFRAN) 4 MG tablet  ?  Sig: Take 1 tablet (4  mg total) by mouth every 8 (eight) hours as needed for nausea or vomiting.  ?  Dispense:  20 tablet  ?  Refill:  0  ? fluconazole (DIFLUCAN) 150 MG tablet  ?  Sig: Take 1 tab po today, repeat in 7 days if needed  ?  Dispense:  2 tablet  ?  Refill:  0  ? ? ?Discontinued medications managed today: ?There are no discontinued medications. ? ?Orders placed today for conditions managed today: ?No orders of the defined types were placed in this encounter. ? ? ?Follow-up: No follow-ups on file. ? ?Dragon [employer] One speech-to-text software was used for transcription in this dictation.  Possible transcriptional errors can occur using Editor, commissioning.  ? ?Signed, ? ?Juliah Scadden T. Anterrio Mccleery, MD ? ? ?Outpatient  Encounter Medications as of 07/14/2021  ?Medication Sig  ? Accu-Chek FastClix Lancets MISC USE 1 LANCET TO CHECK BLOOD SUGAR THREE TIMES DAILY  ? ACCU-CHEK GUIDE test strip USE ONE STRIP THREE TIMES DAILY  ? ADVIL 200 MG tablet Take 200-600 mg by mouth every 6 (six) hours as needed for mild pain or headache.  ? albuterol (PROAIR HFA) 108 (90 Base) MCG/ACT inhaler Inhale 2 puffs into the lungs every 6 (six) hours as needed for wheezing or shortness of breath.  ? Blood Glucose Monitoring Suppl (ACCU-CHEK GUIDE) w/Device KIT by Does not apply route.  ? Cholecalciferol (VITAMIN D3) 1.25 MG (50000 UT) CAPS Take 1 capsule by mouth once a week  ? docusate sodium (COLACE) 100 MG capsule Take 1 capsule (100 mg total) by mouth daily as needed for up to 30 doses.  ? doxycycline (VIBRA-TABS) 100 MG tablet Take 1 tablet (100 mg total) by mouth 2 (two) times daily.  ? esomeprazole (NEXIUM) 40 MG capsule TAKE 1 CAPSULE BY MOUTH DAILY (Patient taking differently: As needed)  ? fluconazole (DIFLUCAN) 150 MG tablet Take 1 tab po today, repeat in 7 days if needed  ? FLUoxetine (PROZAC) 40 MG capsule TAKE 1 CAPSULE BY MOUTH ONCE DAILY FOR ANXIETY  ? HYDROcodone-acetaminophen (NORCO/VICODIN) 5-325 MG tablet Take 1 tablet by mouth every 4 (four) hours as needed.  ? hydrOXYzine (ATARAX/VISTARIL) 10 MG tablet Take 1 to 2 tablets by mouth twice daily as needed for anxiety.  ? Lancets Misc. (ACCU-CHEK FASTCLIX LANCET) KIT Use as directed to test blood sugar daily  ? levothyroxine (SYNTHROID) 75 MCG tablet Take 1 tablet by mouth every morning on an empty stomach with water only.  No food or other medications for 30 minutes. (Patient taking differently: Take 75 mcg by mouth See admin instructions. Take 75 mcg by mouth every morning on an empty stomach with water only, with no food or other medications for 30 minutes)  ? linaclotide (LINZESS) 145 MCG CAPS capsule TAKE 1 CAPSULE BY MOUTH ONCE DAILY BEFORE BREAKFAST (Patient taking differently:  Take 145 mcg by mouth at bedtime.)  ? metoprolol tartrate (LOPRESSOR) 25 MG tablet TAKE 1 TABLET BY MOUTH TWICE DAILY . APPOINTMENT REQUIRED FOR FUTURE REFILLS WITH  DR  [facility]  FOR  FUTURE  REFILLS  ? ondansetron (ZOFRAN) 4 MG tablet Take 4 mg by mouth every 8 (eight) hours as needed for vomiting or nausea.  ? ondansetron (ZOFRAN) 4 MG tablet Take 1 tablet (4 mg total) by mouth every 8 (eight) hours as needed for nausea or vomiting.  ? oxybutynin (DITROPAN-XL) 5 MG 24 hr tablet Take 5 mg by mouth daily.  ? rosuvastatin (CRESTOR) 20 MG tablet Take 1 tablet (20 mg  total) by mouth daily.  ? tamsulosin (FLOMAX) 0.4 MG CAPS capsule Take 0.4 mg by mouth at bedtime.  ? traMADol (ULTRAM) 50 MG tablet Take 50 mg by mouth every 4 (four) hours as needed.  ? ?No facility-administered encounter medications on file as of 07/14/2021.  ?  ?

## 2021-07-17 ENCOUNTER — Telehealth: Payer: Self-pay | Admitting: Primary Care

## 2021-07-17 ENCOUNTER — Other Ambulatory Visit: Payer: Self-pay

## 2021-07-17 ENCOUNTER — Inpatient Hospital Stay (HOSPITAL_COMMUNITY)
Admission: EM | Admit: 2021-07-17 | Discharge: 2021-07-21 | DRG: 392 | Disposition: A | Discharge status: Home / Self Care | Payer: 59 | Attending: Internal Medicine | Admitting: Internal Medicine

## 2021-07-17 ENCOUNTER — Encounter (HOSPITAL_COMMUNITY): Payer: Self-pay | Admitting: Emergency Medicine

## 2021-07-17 DIAGNOSIS — Z79890 Hormone replacement therapy (postmenopausal): Secondary | ICD-10-CM

## 2021-07-17 DIAGNOSIS — S30860A Insect bite (nonvenomous) of lower back and pelvis, initial encounter: Secondary | ICD-10-CM | POA: Diagnosis present

## 2021-07-17 DIAGNOSIS — Z825 Family history of asthma and other chronic lower respiratory diseases: Secondary | ICD-10-CM

## 2021-07-17 DIAGNOSIS — R112 Nausea with vomiting, unspecified: Principal | ICD-10-CM

## 2021-07-17 DIAGNOSIS — Z7982 Long term (current) use of aspirin: Secondary | ICD-10-CM

## 2021-07-17 DIAGNOSIS — G43909 Migraine, unspecified, not intractable, without status migrainosus: Secondary | ICD-10-CM | POA: Diagnosis present

## 2021-07-17 DIAGNOSIS — K760 Fatty (change of) liver, not elsewhere classified: Secondary | ICD-10-CM | POA: Diagnosis present

## 2021-07-17 DIAGNOSIS — I10 Essential (primary) hypertension: Secondary | ICD-10-CM | POA: Diagnosis present

## 2021-07-17 DIAGNOSIS — E876 Hypokalemia: Secondary | ICD-10-CM | POA: Clinically undetermined

## 2021-07-17 DIAGNOSIS — K900 Celiac disease: Secondary | ICD-10-CM | POA: Diagnosis present

## 2021-07-17 DIAGNOSIS — K219 Gastro-esophageal reflux disease without esophagitis: Secondary | ICD-10-CM | POA: Diagnosis present

## 2021-07-17 DIAGNOSIS — E039 Hypothyroidism, unspecified: Secondary | ICD-10-CM | POA: Diagnosis present

## 2021-07-17 DIAGNOSIS — E669 Obesity, unspecified: Secondary | ICD-10-CM | POA: Diagnosis present

## 2021-07-17 DIAGNOSIS — Y93K1 Activity, walking an animal: Secondary | ICD-10-CM

## 2021-07-17 DIAGNOSIS — R197 Diarrhea, unspecified: Secondary | ICD-10-CM | POA: Diagnosis present

## 2021-07-17 DIAGNOSIS — Z87891 Personal history of nicotine dependence: Secondary | ICD-10-CM

## 2021-07-17 DIAGNOSIS — Z6834 Body mass index (BMI) 34.0-34.9, adult: Secondary | ICD-10-CM

## 2021-07-17 DIAGNOSIS — W57XXXA Bitten or stung by nonvenomous insect and other nonvenomous arthropods, initial encounter: Secondary | ICD-10-CM | POA: Diagnosis present

## 2021-07-17 DIAGNOSIS — M7989 Other specified soft tissue disorders: Secondary | ICD-10-CM | POA: Diagnosis present

## 2021-07-17 DIAGNOSIS — Z880 Allergy status to penicillin: Secondary | ICD-10-CM

## 2021-07-17 DIAGNOSIS — R509 Fever, unspecified: Secondary | ICD-10-CM | POA: Diagnosis present

## 2021-07-17 DIAGNOSIS — A419 Sepsis, unspecified organism: Secondary | ICD-10-CM | POA: Diagnosis present

## 2021-07-17 DIAGNOSIS — R519 Headache, unspecified: Secondary | ICD-10-CM | POA: Diagnosis present

## 2021-07-17 DIAGNOSIS — Y92821 Forest as the place of occurrence of the external cause: Secondary | ICD-10-CM

## 2021-07-17 DIAGNOSIS — R6510 Systemic inflammatory response syndrome (SIRS) of non-infectious origin without acute organ dysfunction: Secondary | ICD-10-CM | POA: Diagnosis present

## 2021-07-17 DIAGNOSIS — Z91040 Latex allergy status: Secondary | ICD-10-CM

## 2021-07-17 DIAGNOSIS — S40261A Insect bite (nonvenomous) of right shoulder, initial encounter: Secondary | ICD-10-CM | POA: Diagnosis present

## 2021-07-17 DIAGNOSIS — Z20822 Contact with and (suspected) exposure to covid-19: Secondary | ICD-10-CM | POA: Diagnosis present

## 2021-07-17 DIAGNOSIS — Z87440 Personal history of urinary (tract) infections: Secondary | ICD-10-CM

## 2021-07-17 DIAGNOSIS — Z87442 Personal history of urinary calculi: Secondary | ICD-10-CM

## 2021-07-17 DIAGNOSIS — Z79899 Other long term (current) drug therapy: Secondary | ICD-10-CM

## 2021-07-17 LAB — CBC WITH DIFFERENTIAL/PLATELET
Abs Immature Granulocytes: 0.14 10*3/uL — ABNORMAL HIGH (ref 0.00–0.07)
Basophils Absolute: 0 10*3/uL (ref 0.0–0.1)
Basophils Relative: 0 %
Eosinophils Absolute: 0 10*3/uL (ref 0.0–0.5)
Eosinophils Relative: 0 %
HCT: 45.1 % (ref 36.0–46.0)
Hemoglobin: 15.2 g/dL — ABNORMAL HIGH (ref 12.0–15.0)
Immature Granulocytes: 1 %
Lymphocytes Relative: 2 %
Lymphs Abs: 0.4 10*3/uL — ABNORMAL LOW (ref 0.7–4.0)
MCH: 30.2 pg (ref 26.0–34.0)
MCHC: 33.7 g/dL (ref 30.0–36.0)
MCV: 89.5 fL (ref 80.0–100.0)
Monocytes Absolute: 0.4 10*3/uL (ref 0.1–1.0)
Monocytes Relative: 2 %
Neutro Abs: 19.5 10*3/uL — ABNORMAL HIGH (ref 1.7–7.7)
Neutrophils Relative %: 95 %
Platelets: 276 10*3/uL (ref 150–400)
RBC: 5.04 MIL/uL (ref 3.87–5.11)
RDW: 13.2 % (ref 11.5–15.5)
WBC: 20.4 10*3/uL — ABNORMAL HIGH (ref 4.0–10.5)
nRBC: 0 % (ref 0.0–0.2)

## 2021-07-17 LAB — BASIC METABOLIC PANEL
Anion gap: 11 (ref 5–15)
BUN: 15 mg/dL (ref 6–20)
CO2: 21 mmol/L — ABNORMAL LOW (ref 22–32)
Calcium: 9.5 mg/dL (ref 8.9–10.3)
Chloride: 110 mmol/L (ref 98–111)
Creatinine, Ser: 0.89 mg/dL (ref 0.44–1.00)
GFR, Estimated: >60 mL/min (ref >60)
Glucose, Bld: 121 mg/dL — ABNORMAL HIGH (ref 70–99)
Potassium: 3.8 mmol/L (ref 3.5–5.1)
Sodium: 142 mmol/L (ref 135–145)

## 2021-07-17 MED ORDER — ACETAMINOPHEN 325 MG PO TABS
650.0000 mg | ORAL_TABLET | Freq: Once | ORAL | Status: AC | PRN
  Administered 2021-07-17: 650 mg via ORAL
  Filled 2021-07-17: qty 2

## 2021-07-17 NOTE — ED Triage Notes (Signed)
Pt presents with EMS from home with complaints of n/v/d starting today.  Feels it is possibly from a tick bite last Wednesday that was removed by her daughter on Sunday.  Monday she went to her doctor and they had to remove the tick's head which was still intact and was given anbx and zofran.  Dime sized on right shoulder blade.  ?

## 2021-07-17 NOTE — Telephone Encounter (Signed)
Pt called again and stated this "she was seen on Monday by Dr Lorelei Pont for a tick bite and said she had to leave work because she has been throwing up, she said her head hurts and she has diarrhea." I transferred her to access nurse after speaking with her on the phone.  ? ?Callback Number: [phone] ?

## 2021-07-17 NOTE — Telephone Encounter (Signed)
Pt called and said she was saw Monday by Dr Lorelei Pont for a tick bite and said she is having to leave work because she has been throwing up, she said her head hurts and she has diarrhea. What does she nee d to do. Call back is [phone]. She said she want sure if it was from the tick bite or not ?

## 2021-07-17 NOTE — ED Provider Triage Note (Signed)
Emergency Medicine Provider Triage Evaluation Note ? ?Abigail [name] , a 56 y.o. female  was evaluated in triage.  Pt complains of tick bite that was removed Monday at her PCP office.  States has not been feeling well since last night.  Believes she was potentially bit by the tick on Wednesday.  Has been taking doxycycline since Monday.  Mild abdominal pain, nausea, vomiting, and diarrhea ? ?Review of Systems  ?Positive: As above ?Negative: As above ? ?Physical Exam  ?BP 131/63 (BP Location: Right Arm)   Pulse (!) 116   Temp (!) 102.4 ?F (39.1 ?C) (Oral)   Resp 18   LMP 08/12/2015 Comment: patient states hx. tubal ligation  SpO2 92%  ?Gen:   Awake, no distress   ?Resp:  Normal effort  ?MSK:   Moves extremities without difficulty  ?Other:  Mild abdominal tenderness. ? ?Medical Decision Making  ?Medically screening exam initiated at 7:03 PM.  Appropriate orders placed.  Shirleyann A Lauf was informed that the remainder of the evaluation will be completed by another provider, this initial triage assessment does not replace that evaluation, and the importance of remaining in the ED until their evaluation is complete. ? ? ?  ?Evlyn Courier, PA-C ?07/17/21 1904 ? ?

## 2021-07-18 ENCOUNTER — Emergency Department (HOSPITAL_COMMUNITY): Payer: 59

## 2021-07-18 ENCOUNTER — Telehealth: Payer: Self-pay | Admitting: *Deleted

## 2021-07-18 ENCOUNTER — Encounter (HOSPITAL_COMMUNITY): Payer: Self-pay | Admitting: Emergency Medicine

## 2021-07-18 DIAGNOSIS — Z79899 Other long term (current) drug therapy: Secondary | ICD-10-CM | POA: Diagnosis not present

## 2021-07-18 DIAGNOSIS — Z6834 Body mass index (BMI) 34.0-34.9, adult: Secondary | ICD-10-CM | POA: Diagnosis not present

## 2021-07-18 DIAGNOSIS — A419 Sepsis, unspecified organism: Secondary | ICD-10-CM | POA: Diagnosis present

## 2021-07-18 DIAGNOSIS — Z825 Family history of asthma and other chronic lower respiratory diseases: Secondary | ICD-10-CM | POA: Diagnosis not present

## 2021-07-18 DIAGNOSIS — R112 Nausea with vomiting, unspecified: Secondary | ICD-10-CM | POA: Insufficient documentation

## 2021-07-18 DIAGNOSIS — Z79890 Hormone replacement therapy (postmenopausal): Secondary | ICD-10-CM | POA: Diagnosis not present

## 2021-07-18 DIAGNOSIS — E669 Obesity, unspecified: Secondary | ICD-10-CM | POA: Diagnosis present

## 2021-07-18 DIAGNOSIS — E039 Hypothyroidism, unspecified: Secondary | ICD-10-CM | POA: Diagnosis present

## 2021-07-18 DIAGNOSIS — Y93K1 Activity, walking an animal: Secondary | ICD-10-CM | POA: Diagnosis not present

## 2021-07-18 DIAGNOSIS — R197 Diarrhea, unspecified: Secondary | ICD-10-CM | POA: Diagnosis present

## 2021-07-18 DIAGNOSIS — Z20822 Contact with and (suspected) exposure to covid-19: Secondary | ICD-10-CM | POA: Diagnosis present

## 2021-07-18 DIAGNOSIS — Z7982 Long term (current) use of aspirin: Secondary | ICD-10-CM | POA: Diagnosis not present

## 2021-07-18 DIAGNOSIS — Z87891 Personal history of nicotine dependence: Secondary | ICD-10-CM | POA: Diagnosis not present

## 2021-07-18 DIAGNOSIS — E876 Hypokalemia: Secondary | ICD-10-CM | POA: Diagnosis present

## 2021-07-18 DIAGNOSIS — Z87442 Personal history of urinary calculi: Secondary | ICD-10-CM | POA: Diagnosis not present

## 2021-07-18 DIAGNOSIS — K900 Celiac disease: Secondary | ICD-10-CM | POA: Diagnosis present

## 2021-07-18 DIAGNOSIS — W57XXXA Bitten or stung by nonvenomous insect and other nonvenomous arthropods, initial encounter: Secondary | ICD-10-CM | POA: Diagnosis present

## 2021-07-18 DIAGNOSIS — Z87440 Personal history of urinary (tract) infections: Secondary | ICD-10-CM | POA: Diagnosis not present

## 2021-07-18 DIAGNOSIS — K760 Fatty (change of) liver, not elsewhere classified: Secondary | ICD-10-CM | POA: Diagnosis present

## 2021-07-18 DIAGNOSIS — I10 Essential (primary) hypertension: Secondary | ICD-10-CM | POA: Diagnosis present

## 2021-07-18 DIAGNOSIS — Y92821 Forest as the place of occurrence of the external cause: Secondary | ICD-10-CM | POA: Diagnosis not present

## 2021-07-18 DIAGNOSIS — S30860D Insect bite (nonvenomous) of lower back and pelvis, subsequent encounter: Secondary | ICD-10-CM | POA: Diagnosis not present

## 2021-07-18 DIAGNOSIS — Z91040 Latex allergy status: Secondary | ICD-10-CM | POA: Diagnosis not present

## 2021-07-18 DIAGNOSIS — R509 Fever, unspecified: Secondary | ICD-10-CM | POA: Diagnosis present

## 2021-07-18 DIAGNOSIS — M7989 Other specified soft tissue disorders: Secondary | ICD-10-CM | POA: Diagnosis present

## 2021-07-18 DIAGNOSIS — W57XXXD Bitten or stung by nonvenomous insect and other nonvenomous arthropods, subsequent encounter: Secondary | ICD-10-CM

## 2021-07-18 DIAGNOSIS — S30860A Insect bite (nonvenomous) of lower back and pelvis, initial encounter: Secondary | ICD-10-CM | POA: Diagnosis present

## 2021-07-18 DIAGNOSIS — K219 Gastro-esophageal reflux disease without esophagitis: Secondary | ICD-10-CM | POA: Diagnosis present

## 2021-07-18 DIAGNOSIS — R519 Headache, unspecified: Secondary | ICD-10-CM | POA: Diagnosis not present

## 2021-07-18 DIAGNOSIS — Z880 Allergy status to penicillin: Secondary | ICD-10-CM | POA: Diagnosis not present

## 2021-07-18 DIAGNOSIS — R6510 Systemic inflammatory response syndrome (SIRS) of non-infectious origin without acute organ dysfunction: Secondary | ICD-10-CM | POA: Diagnosis present

## 2021-07-18 LAB — LACTIC ACID, PLASMA
Lactic Acid, Venous: 1.8 mmol/L (ref 0.5–1.9)
Lactic Acid, Venous: 1.4 mmol/L (ref 0.5–1.9)

## 2021-07-18 LAB — SAVE SMEAR(SSMR), FOR PROVIDER SLIDE REVIEW

## 2021-07-18 LAB — URINALYSIS, ROUTINE W REFLEX MICROSCOPIC
Bacteria, UA: NONE SEEN
Bilirubin Urine: NEGATIVE
Glucose, UA: NEGATIVE mg/dL
Hgb urine dipstick: NEGATIVE
Ketones, ur: NEGATIVE mg/dL
Leukocytes,Ua: NEGATIVE
Nitrite: NEGATIVE
Protein, ur: NEGATIVE mg/dL
Specific Gravity, Urine: >1.046 — ABNORMAL HIGH (ref 1.005–1.030)
pH: 5 (ref 5.0–8.0)

## 2021-07-18 LAB — COMPREHENSIVE METABOLIC PANEL
ALT: 29 U/L (ref 0–44)
AST: 21 U/L (ref 15–41)
Albumin: 4.2 g/dL (ref 3.5–5.0)
Alkaline Phosphatase: 93 U/L (ref 38–126)
Anion gap: 11 (ref 5–15)
BUN: 13 mg/dL (ref 6–20)
CO2: 25 mmol/L (ref 22–32)
Calcium: 9.8 mg/dL (ref 8.9–10.3)
Chloride: 104 mmol/L (ref 98–111)
Creatinine, Ser: 0.86 mg/dL (ref 0.44–1.00)
GFR, Estimated: >60 mL/min (ref >60)
Glucose, Bld: 124 mg/dL — ABNORMAL HIGH (ref 70–99)
Potassium: 3.6 mmol/L (ref 3.5–5.1)
Sodium: 140 mmol/L (ref 135–145)
Total Bilirubin: 1.6 mg/dL — ABNORMAL HIGH (ref 0.3–1.2)
Total Protein: 7.5 g/dL (ref 6.5–8.1)

## 2021-07-18 LAB — RESP PANEL BY RT-PCR (FLU A&B, COVID) ARPGX2
Influenza A by PCR: NEGATIVE
Influenza B by PCR: NEGATIVE
SARS Coronavirus 2 by RT PCR: NEGATIVE

## 2021-07-18 LAB — APTT: aPTT: 25 seconds (ref 24–36)

## 2021-07-18 LAB — HIV ANTIBODY (ROUTINE TESTING W REFLEX): HIV Screen 4th Generation wRfx: NONREACTIVE

## 2021-07-18 LAB — CORTISOL-AM, BLOOD: Cortisol - AM: 10.8 ug/dL (ref 6.7–22.6)

## 2021-07-18 LAB — PROTIME-INR
INR: 1 (ref 0.8–1.2)
Prothrombin Time: 13.2 seconds (ref 11.4–15.2)

## 2021-07-18 LAB — PROCALCITONIN: Procalcitonin: 0.16 ng/mL

## 2021-07-18 MED ORDER — LACTATED RINGERS IV BOLUS (SEPSIS)
1000.0000 mL | Freq: Once | INTRAVENOUS | Status: AC
  Administered 2021-07-18: 1000 mL via INTRAVENOUS

## 2021-07-18 MED ORDER — IBUPROFEN 200 MG PO TABS
200.0000 mg | ORAL_TABLET | Freq: Four times a day (QID) | ORAL | Status: DC | PRN
  Administered 2021-07-18: 400 mg via ORAL
  Filled 2021-07-18: qty 2

## 2021-07-18 MED ORDER — LINACLOTIDE 145 MCG PO CAPS
145.0000 ug | ORAL_CAPSULE | Freq: Every day | ORAL | Status: DC
  Administered 2021-07-18 – 2021-07-20 (×3): 145 ug via ORAL
  Filled 2021-07-18 (×4): qty 1

## 2021-07-18 MED ORDER — METRONIDAZOLE 500 MG/100ML IV SOLN
500.0000 mg | Freq: Once | INTRAVENOUS | Status: AC
  Administered 2021-07-18: 500 mg via INTRAVENOUS
  Filled 2021-07-18: qty 100

## 2021-07-18 MED ORDER — ACETAMINOPHEN 325 MG PO TABS
650.0000 mg | ORAL_TABLET | Freq: Four times a day (QID) | ORAL | Status: DC | PRN
  Administered 2021-07-19 – 2021-07-20 (×3): 650 mg via ORAL
  Filled 2021-07-18 (×3): qty 2

## 2021-07-18 MED ORDER — IOHEXOL 300 MG/ML  SOLN
100.0000 mL | Freq: Once | INTRAMUSCULAR | Status: AC | PRN
  Administered 2021-07-18: 100 mL via INTRAVENOUS

## 2021-07-18 MED ORDER — ALBUTEROL SULFATE (2.5 MG/3ML) 0.083% IN NEBU: 2.5000 mg | INHALATION_SOLUTION | Freq: Four times a day (QID) | RESPIRATORY_TRACT | Status: DC | PRN

## 2021-07-18 MED ORDER — ONDANSETRON HCL 4 MG PO TABS: 4.0000 mg | ORAL_TABLET | Freq: Four times a day (QID) | ORAL | Status: DC | PRN

## 2021-07-18 MED ORDER — HYDROCODONE-ACETAMINOPHEN 5-325 MG PO TABS
0.5000 | ORAL_TABLET | ORAL | Status: DC | PRN
  Administered 2021-07-18 – 2021-07-20 (×3): 0.5 via ORAL
  Filled 2021-07-18 (×3): qty 1

## 2021-07-18 MED ORDER — ACETAMINOPHEN 650 MG RE SUPP: 650.0000 mg | Freq: Four times a day (QID) | RECTAL | Status: DC | PRN

## 2021-07-18 MED ORDER — VANCOMYCIN HCL IN DEXTROSE 1-5 GM/200ML-% IV SOLN: 1000.0000 mg | Freq: Once | INTRAVENOUS | Status: DC

## 2021-07-18 MED ORDER — LEVOTHYROXINE SODIUM 75 MCG PO TABS
75.0000 ug | ORAL_TABLET | Freq: Every day | ORAL | Status: DC
  Administered 2021-07-18 – 2021-07-21 (×4): 75 ug via ORAL
  Filled 2021-07-18 (×4): qty 1

## 2021-07-18 MED ORDER — PANTOPRAZOLE SODIUM 40 MG PO TBEC
40.0000 mg | DELAYED_RELEASE_TABLET | Freq: Every day | ORAL | Status: DC
  Administered 2021-07-18 – 2021-07-21 (×4): 40 mg via ORAL
  Filled 2021-07-18 (×4): qty 1

## 2021-07-18 MED ORDER — ONDANSETRON HCL 4 MG/2ML IJ SOLN
4.0000 mg | Freq: Four times a day (QID) | INTRAMUSCULAR | Status: DC | PRN
  Administered 2021-07-18 – 2021-07-20 (×5): 4 mg via INTRAVENOUS
  Filled 2021-07-18 (×5): qty 2

## 2021-07-18 MED ORDER — ROSUVASTATIN CALCIUM 20 MG PO TABS
20.0000 mg | ORAL_TABLET | Freq: Every day | ORAL | Status: DC
  Administered 2021-07-18 – 2021-07-21 (×4): 20 mg via ORAL
  Filled 2021-07-18 (×4): qty 1

## 2021-07-18 MED ORDER — LACTATED RINGERS IV BOLUS (SEPSIS)
500.0000 mL | Freq: Once | INTRAVENOUS | Status: AC
  Administered 2021-07-18: 500 mL via INTRAVENOUS

## 2021-07-18 MED ORDER — SODIUM CHLORIDE 0.9 % IV BOLUS
1000.0000 mL | Freq: Once | INTRAVENOUS | Status: AC
  Administered 2021-07-18: 1000 mL via INTRAVENOUS

## 2021-07-18 MED ORDER — SODIUM CHLORIDE 0.9 % IV SOLN
2.0000 g | Freq: Once | INTRAVENOUS | Status: AC
  Administered 2021-07-18: 2 g via INTRAVENOUS
  Filled 2021-07-18: qty 12.5

## 2021-07-18 MED ORDER — FLUOXETINE HCL 20 MG PO CAPS
40.0000 mg | ORAL_CAPSULE | Freq: Every day | ORAL | Status: DC
  Administered 2021-07-18 – 2021-07-21 (×4): 40 mg via ORAL
  Filled 2021-07-18 (×4): qty 2

## 2021-07-18 MED ORDER — OXYBUTYNIN CHLORIDE ER 5 MG PO TB24
5.0000 mg | ORAL_TABLET | Freq: Every day | ORAL | Status: DC
Start: 2021-07-18 — End: 2021-07-20
  Administered 2021-07-18 – 2021-07-20 (×3): 5 mg via ORAL
  Filled 2021-07-18 (×3): qty 1

## 2021-07-18 MED ORDER — LACTATED RINGERS IV SOLN: INTRAVENOUS | Status: DC

## 2021-07-18 MED ORDER — VANCOMYCIN HCL 1500 MG/300ML IV SOLN
1500.0000 mg | Freq: Once | INTRAVENOUS | Status: AC
  Administered 2021-07-18: 1500 mg via INTRAVENOUS
  Filled 2021-07-18: qty 300

## 2021-07-18 MED ORDER — HYDROXYZINE HCL 10 MG PO TABS: 10.0000 mg | ORAL_TABLET | Freq: Two times a day (BID) | ORAL | Status: DC | PRN

## 2021-07-18 MED ORDER — SODIUM CHLORIDE 0.9 % IV SOLN
100.0000 mg | Freq: Two times a day (BID) | INTRAVENOUS | Status: DC
  Administered 2021-07-18 – 2021-07-21 (×7): 100 mg via INTRAVENOUS
  Filled 2021-07-18 (×10): qty 100

## 2021-07-18 NOTE — Consult Note (Signed)
?   ? ? ? ? ?[facility]   ? ?Date of Admission:  07/17/2021    ? ?Reason for Consult: sepsis/tick bite    ?Referring Provider: Grandville Silos ? ? ? ? ?Abx: ?5/10-c doxycycline      ? ? ?Assessment: ?56 yo female with a week gi sx after a tick bite, admitted with sepsis and n/v/diarrhea/headache/myalgia as well ? ?Abd ct nonobstructive GU stone ?Chest xray clear ?Bcx ngtd ?Hiv negative ?Pending lyme/rmsf serology ordered by primary team ? ?Lack of thrombocytopenia, lft elevation rather unusual  ?Rare fore lyme here in [place] ? ?Suspect viral illness ? ?Reasonable, however to continue doxy ? ? ?Plan: ?Will order a blood smear review (add on to today's lab) ?F/u bcx, lyme/rmsf serology ?Will also add on ehrlichiosis labs ?Continue doxy ?Discuss with primary team ? ?I spent 80 minute reviewing data/chart, and coordinating care and >50% direct face to face time providing counseling/discussing diagnostics/treatment plan with patient ? ? ? ?------------------------------------------------ ?Principal Problem: ?  Sepsis ([place]) ?Active Problems: ?  Hypothyroidism ?  Tick bite of back ? ? ? ?HPI: Abigail [name] is a 56 y.o. female hx migraine, admitted with sepsis in setting of a tick bite ? ? ?She was walking her dog in an wooded area about a week ago. She thought she felt a bird pooped on the right upper back. However, the next day felt itchy there so scratched it and saw in front of a mirror what she thought was a tick. Her daughter who works for [employer] ultimately got it off with a tweezer ? ?She started developing n/v/diarrhea, fever, myalgia, malaise, about a day after that. She saw her pcp 4 days prior to this admission and given doxy ? ?Started developing severe headache 1-2 days prior to admission, so came to ed ? ?On arrival febrile ?Leukocytosis 20 but no thrombocytopenia, or transaminitis ?Bcx in progress ?Continued on doxycycline ?Abd ct nonobstructive urinary stone ?Chest  xray clear ? ?Felt about the same ? ?Lyme/rmsf testing pending.  ? ? ? ?Family History  ?Problem Relation Age of Onset  ? Gallstones Mother   ? COPD Father   ? Emphysema Father   ? Colon cancer Neg Hx   ? Stomach cancer Neg Hx   ? Esophageal cancer Neg Hx   ? Pancreatic cancer Neg Hx   ? Liver disease Neg Hx   ? ? ?Social History  ? ?Tobacco Use  ? Smoking status: Former  ?  Types: Cigarettes  ?  Quit date: 2008  ?  Years since quitting: 15.3  ? Smokeless tobacco: Never  ? Tobacco comments:  ?  used tobacco "socially"  ?Vaping Use  ? Vaping Use: Never used  ?Substance Use Topics  ? Alcohol use: No  ?  Alcohol/week: 0.0 standard drinks  ? Drug use: No  ? ? ?Allergies  ?Allergen Reactions  ? Latex Rash  ? Penicillins Nausea Only  ?  Has patient had a PCN reaction causing immediate rash, facial/tongue/throat swelling, SOB or lightheadedness with hypotension: No ?Has patient had a PCN reaction causing severe rash involving mucus membranes or skin necrosis: No ?Has patient had a PCN reaction that required hospitalization: No ?Has patient had a PCN reaction occurring within the last 10 years: No ?If all of the above answers are "NO", then may proceed with Cephalosporin use. ?  ? ? ?Review of Systems: ?ROS ?All Other ROS was negative, except mentioned above ? ? ?Past Medical History:  ?  Diagnosis Date  ? Anxiety   ? Bulging lumbar disc   ? CAP (community acquired pneumonia)   ? Cardiac arrhythmia due to congenital heart disease   ? no arrhythmia identified   ? Celiac disease   ? Depression   ? Diverticulosis   ? Sigmoid colon  ? Fatty liver 05/2016  ? Frequent headaches   ? GERD (gastroesophageal reflux disease)   ? Heart disease   ? History of kidney stones   ? Hypertension   ? Kidney stones   ? Migraine   ? Obesity (BMI 35.0-39.9 without comorbidity)   ? UTI (urinary tract infection)   ? ? ? ? ? ?Scheduled Meds: ? FLUoxetine  40 mg Oral Daily  ? levothyroxine  75 mcg Oral Q0600  ? linaclotide  145 mcg Oral QHS  ?  oxybutynin  5 mg Oral Daily  ? pantoprazole  40 mg Oral Daily  ? rosuvastatin  20 mg Oral Daily  ? ?Continuous Infusions: ? doxycycline (VIBRAMYCIN) IV Stopped (07/18/21 6381)  ? lactated ringers 150 mL/hr at 07/18/21 0249  ? ?PRN Meds:.acetaminophen **OR** acetaminophen, albuterol, HYDROcodone-acetaminophen, hydrOXYzine, ibuprofen, ondansetron **OR** ondansetron (ZOFRAN) IV ? ? ?OBJECTIVE: ?Blood pressure (!) 104/59, pulse 82, temperature (!) 102.4 ?F (39.1 ?C), temperature source Oral, resp. rate (!) 22, last menstrual period 08/12/2015, SpO2 99 %. ? ?Physical Exam ?General/constitutional: no distress, pleasant ?HEENT: Normocephalic, PER, Conj Clear, EOMI, Oropharynx clear ?Neck supple ?CV: rrr no mrg ?Lungs: clear to auscultation, normal respiratory effort ?Abd: Soft, Nontender ?Ext: no edema ?Skin: right upper back is a 1 cm central ulcer with slight surrounding erythema not typical of erythema migran rash ?Neuro: nonfocal ?MSK: no peripheral joint swelling/tenderness/warmth; back spines nontender ? ? ? ? ? ? ?Lab Results ?Lab Results  ?Component Value Date  ? WBC 20.4 (H) 07/17/2021  ? HGB 15.2 (H) 07/17/2021  ? HCT 45.1 07/17/2021  ? MCV 89.5 07/17/2021  ? PLT 276 07/17/2021  ?  ?Lab Results  ?Component Value Date  ? CREATININE 0.86 07/18/2021  ? BUN 13 07/18/2021  ? NA 140 07/18/2021  ? K 3.6 07/18/2021  ? CL 104 07/18/2021  ? CO2 25 07/18/2021  ?  ?Lab Results  ?Component Value Date  ? ALT 29 07/18/2021  ? AST 21 07/18/2021  ? ALKPHOS 93 07/18/2021  ? BILITOT 1.6 (H) 07/18/2021  ?  ? ? ?Microbiology: ?Recent Results (from the past 240 hour(s))  ?Resp Panel by RT-PCR (Flu A&B, Covid) Nasopharyngeal Swab     Status: None  ? Collection Time: 07/18/21  2:24 AM  ? Specimen: Nasopharyngeal Swab; Nasopharyngeal(NP) swabs in vial transport medium  ?Result Value Ref Range Status  ? SARS Coronavirus 2 by RT PCR NEGATIVE NEGATIVE Final  ?  Comment: (NOTE) ?SARS-CoV-2 target nucleic acids are NOT DETECTED. ? ?The  SARS-CoV-2 RNA is generally detectable in upper respiratory ?specimens during the acute phase of infection. The lowest ?concentration of SARS-CoV-2 viral copies this assay can detect is ?138 copies/mL. A negative result does not preclude SARS-Cov-2 ?infection and should not be used as the sole basis for treatment or ?other patient management decisions. A negative result may occur with  ?improper specimen collection/handling, submission of specimen other ?than nasopharyngeal swab, presence of viral mutation(s) within the ?areas targeted by this assay, and inadequate number of viral ?copies(<138 copies/mL). A negative result must be combined with ?clinical observations, patient history, and epidemiological ?information. The expected result is Negative. ? ?Fact Sheet for Patients:  ?EntrepreneurPulse.com.au ? ?Fact Sheet  for Healthcare Providers:  ?IncredibleEmployment.be ? ?This test is no t yet approved or cleared by the [place] FDA and  ?has been authorized for detection and/or diagnosis of SARS-CoV-2 by ?FDA under an Emergency Use Authorization (EUA). This EUA will remain  ?in effect (meaning this test can be used) for the duration of the ?COVID-19 declaration under Section 564(b)(1) of the Act, 21 ?U.S.C.section 360bbb-3(b)(1), unless the authorization is terminated  ?or revoked sooner.  ? ? ?  ? Influenza A by PCR NEGATIVE NEGATIVE Final  ? Influenza B by PCR NEGATIVE NEGATIVE Final  ?  Comment: (NOTE) ?The Xpert Xpress SARS-CoV-2/FLU/RSV plus assay is intended as an aid ?in the diagnosis of influenza from Nasopharyngeal swab specimens and ?should not be used as a sole basis for treatment. Nasal washings and ?aspirates are unacceptable for Xpert Xpress SARS-CoV-2/FLU/RSV ?testing. ? ?Fact Sheet for Patients: ?EntrepreneurPulse.com.au ? ?Fact Sheet for Healthcare Providers: ?IncredibleEmployment.be ? ?This test is not yet approved or  cleared by the [place] FDA and ?has been authorized for detection and/or diagnosis of SARS-CoV-2 by ?FDA under an Emergency Use Authorization (EUA). This EUA will remain ?in effect (meaning this test can be used)

## 2021-07-18 NOTE — H&P (Signed)
?History and Physical  ? ? ?Patient: Abigail [name] BJS:[id number] DOB: 1965/04/02 ?DOA: 07/17/2021 ?DOS: the patient was seen and examined on 07/18/2021 ?PCP: [name], NP  ?Patient coming from: Home ? ?Chief Complaint:  ?Chief Complaint  ?Patient presents with  ? Insect Bite  ? Emesis  ? ?HPI: Abigail [name] is a 56 y.o. female with medical history significant of HTN, obesity, migraine headaches. ? ?Pt presents to the ED with ~6 day history of N/V/D, fever, body aches, tick found on her back removed by PCP 4 days ago in office.  Also developed severe headache over past 24-48h. ? ?No abd pain. ? ?No cough, does have sinus symptoms (nasal congestion). ? ?Started on doxycycline by PCP but only had 2 doses of this and thinks she vomited both up. ? ?Tm 102.4 in ED. ?  ?Review of Systems: As mentioned in the history of present illness. All other systems reviewed and are negative. ?Past Medical History:  ?Diagnosis Date  ? Anxiety   ? Bulging lumbar disc   ? CAP (community acquired pneumonia)   ? Cardiac arrhythmia due to congenital heart disease   ? no arrhythmia identified   ? Celiac disease   ? Depression   ? Diverticulosis   ? Sigmoid colon  ? Fatty liver 05/2016  ? Frequent headaches   ? GERD (gastroesophageal reflux disease)   ? Heart disease   ? History of kidney stones   ? Hypertension   ? Kidney stones   ? Migraine   ? Obesity (BMI 35.0-39.9 without comorbidity)   ? UTI (urinary tract infection)   ? ?Past Surgical History:  ?Procedure Laterality Date  ? ABDOMINAL EXPLORATION SURGERY  1986  ? COLONOSCOPY  06/2016  ? CYSTOSCOPY/URETEROSCOPY/HOLMIUM LASER/STENT PLACEMENT Right 05/16/2021  ? Procedure: CYSTOSCOPY/URETEROSCOPY/HOLMIUM LASER/STENT PLACEMENT;  Surgeon: Janith Lima, MD;  Location: [facility];  Service: Urology;  Laterality: Right;  ? ESOPHAGEAL MANOMETRY N/A 12/06/2019  ? Procedure: ESOPHAGEAL MANOMETRY (EM);  Surgeon: Mauri Pole, MD;  Location: [facility];  Service:  Endoscopy;  Laterality: N/A;  ? EXTRACORPOREAL SHOCK WAVE LITHOTRIPSY Left 12/03/2016  ? Procedure: LEFT EXTRACORPOREAL SHOCK WAVE LITHOTRIPSY (ESWL);  Surgeon: Irine Seal, MD;  Location: [facility];  Service: Urology;  Laterality: Left;  ? KIDNEY STONE SURGERY    ? LEFT HEART CATH AND CORONARY ANGIOGRAPHY  07/28/2016  ?  Large tortuous coronary arteries no radiographic evidence of disease.  Not anginal chest pain.  Normal EF.  ? LITHOTRIPSY    ? NASAL SINUS SURGERY    ? ROTATOR CUFF REPAIR Right 12/27/2017  ? TRANSTHORACIC ECHOCARDIOGRAM  06/2017  ? EF 60-65%.  GR 1-2 DD.  Otherwise normal echo.  Normal valve function.  Normal wall motion.  ? TUBAL LIGATION    ? ?Social History:  reports that she quit smoking about 15 years ago. Her smoking use included cigarettes. She has never used smokeless tobacco. She reports that she does not drink alcohol and does not use drugs. ? ?Allergies  ?Allergen Reactions  ? Latex Rash  ? Penicillins Nausea Only  ?  Has patient had a PCN reaction causing immediate rash, facial/tongue/throat swelling, SOB or lightheadedness with hypotension: No ?Has patient had a PCN reaction causing severe rash involving mucus membranes or skin necrosis: No ?Has patient had a PCN reaction that required hospitalization: No ?Has patient had a PCN reaction occurring within the last 10 years: No ?If all of the above answers are "NO", then may proceed with Cephalosporin  use. ?  ? ? ?Family History  ?Problem Relation Age of Onset  ? Gallstones Mother   ? COPD Father   ? Emphysema Father   ? Colon cancer Neg Hx   ? Stomach cancer Neg Hx   ? Esophageal cancer Neg Hx   ? Pancreatic cancer Neg Hx   ? Liver disease Neg Hx   ? ? ?Prior to Admission medications   ?Medication Sig Start Date End Date Taking? Authorizing Provider  ?ADVIL 200 MG tablet Take 200-600 mg by mouth every 6 (six) hours as needed for mild pain or headache.   Yes [provider]  ?albuterol (PROAIR HFA) 108 (90 Base) MCG/ACT inhaler  Inhale 2 puffs into the lungs every 6 (six) hours as needed for wheezing or shortness of breath. 03/04/21  Yes Tower, Wynelle Fanny, MD  ?Aspirin-Caffeine (BAYER BACK & BODY PO) Take 1 tablet by mouth daily as needed (body pain).   Yes [provider]  ?Cholecalciferol (VITAMIN D3) 1.25 MG (50000 UT) CAPS Take 1 capsule by mouth once a week ?Patient taking differently: Take 50,000 Units by mouth every Sunday. 05/23/21  Yes [name], NP  ?docusate sodium (COLACE) 100 MG capsule Take 1 capsule (100 mg total) by mouth daily as needed for up to 30 doses. 05/16/21  Yes Janith Lima, MD  ?doxycycline (VIBRA-TABS) 100 MG tablet Take 1 tablet (100 mg total) by mouth 2 (two) times daily. ?Patient taking differently: Take 100 mg by mouth See admin instructions. Bid x 10 days 07/14/21  Yes Copland, Frederico Hamman, MD  ?esomeprazole (NEXIUM) 40 MG capsule TAKE 1 CAPSULE BY MOUTH DAILY ?Patient taking differently: Take 40 mg by mouth daily as needed (heartburn). 02/24/16  Yes [name], NP  ?FLUoxetine (PROZAC) 40 MG capsule TAKE 1 CAPSULE BY MOUTH ONCE DAILY FOR ANXIETY ?Patient taking differently: Take 40 mg by mouth daily. 07/09/21  Yes [name], NP  ?HYDROcodone-acetaminophen (NORCO/VICODIN) 5-325 MG tablet Take 0.5 tablets by mouth every 4 (four) hours as needed for moderate pain.   Yes [provider]  ?hydrOXYzine (ATARAX/VISTARIL) 10 MG tablet Take 1 to 2 tablets by mouth twice daily as needed for anxiety. ?Patient taking differently: Take 10-20 mg by mouth 2 (two) times daily as needed for anxiety. 07/04/19  Yes [name], NP  ?levothyroxine (SYNTHROID) 75 MCG tablet Take 1 tablet by mouth every morning on an empty stomach with water only.  No food or other medications for 30 minutes. ?Patient taking differently: Take 75 mcg by mouth See admin instructions. Take 75 mcg by mouth every morning on an empty stomach with water only, with no food or other medications for 30 minutes  02/21/21  Yes [name], NP  ?linaclotide (LINZESS) 145 MCG CAPS capsule TAKE 1 CAPSULE BY MOUTH ONCE DAILY BEFORE BREAKFAST ?Patient taking differently: Take 145 mcg by mouth at bedtime. 01/20/21  Yes Nandigam, Venia Minks, MD  ?metoprolol tartrate (LOPRESSOR) 25 MG tablet TAKE 1 TABLET BY MOUTH TWICE DAILY . APPOINTMENT REQUIRED FOR FUTURE REFILLS WITH  DR  [facility]  FOR  FUTURE  REFILLS ?Patient taking differently: Take 25 mg by mouth 2 (two) times daily. 07/02/21  Yes Leonie Man, MD  ?ondansetron (ZOFRAN) 4 MG tablet Take 1 tablet (4 mg total) by mouth every 8 (eight) hours as needed for nausea or vomiting. 07/14/21  Yes Copland, Frederico Hamman, MD  ?oxybutynin (DITROPAN-XL) 5 MG 24 hr tablet Take 5 mg by mouth daily. 12/03/17  Yes  [provider]  ?rosuvastatin (CRESTOR) 20 MG tablet Take 1 tablet (20 mg total) by mouth daily. 08/29/20  Yes Leonie Man, MD  ?tamsulosin (FLOMAX) 0.4 MG CAPS capsule Take 0.4 mg by mouth daily as needed (with kidney stones). 05/27/20  Yes [provider]  ?traMADol (ULTRAM) 50 MG tablet Take 50 mg by mouth every 4 (four) hours as needed for moderate pain. 07/05/21  Yes [provider]  ?Accu-Chek FastClix Lancets MISC USE 1 LANCET TO CHECK BLOOD SUGAR THREE TIMES DAILY 06/24/21   [name], NP  ?ACCU-CHEK GUIDE test strip USE ONE STRIP THREE TIMES DAILY 06/24/21   [provider]  ?Blood Glucose Monitoring Suppl (ACCU-CHEK GUIDE) w/Device KIT by Does not apply route.    [provider]  ?Lancets Misc. (ACCU-CHEK FASTCLIX LANCET) KIT Use as directed to test blood sugar daily 10/03/19   [name], NP  ? ? ?Physical Exam: ?Vitals:  ? 07/18/21 0230 07/18/21 0245 07/18/21 0300 07/18/21 0315  ?BP: 109/63 (!) 114/58 111/66 98/71  ?Pulse: 86 79 86 77  ?Resp: 18 16 17 13   ?Temp:      ?TempSrc:      ?SpO2: 98% 98% 99% 100%  ? ?Constitutional: NAD, calm, comfortable ?Eyes: PERRL, lids and conjunctivae normal ?ENMT: Mucous  membranes are moist. Posterior pharynx clear of any exudate or lesions.Normal dentition.  ?Neck: normal, supple, no masses, no thyromegaly, no meningismus ?Respiratory: clear to auscultation bilaterally, no wheezing, no crackl

## 2021-07-18 NOTE — Plan of Care (Signed)
?  Problem: Fluid Volume: ?Goal: Hemodynamic stability will improve ?Outcome: Progressing ?  ?

## 2021-07-18 NOTE — Assessment & Plan Note (Addendum)
Tick removed by PCP x4 days prior to admission.   ?-No surrounding erythema or rash.   ?-See problem #1 sepsis above.   ?-Patient seen in consultation by ID who are in agreement with IV doxycycline during the hospitalization. ?-Patient improved clinically. ?-Patient be discharged home on 3-1/2 more days of oral doxycycline to complete a 7-day course of treatment per ID recommendations. ?-Outpatient follow-up with PCP. ?

## 2021-07-18 NOTE — TOC Progression Note (Signed)
Transition of Care (TOC) - Progression Note  ? ? ?Patient Details  ?Name: Abigail [name] ?MRN: [id number] ?Date of Birth: 1965-08-10 ? ?Transition of Care (TOC) CM/SW Contact  ?Zenon Mayo, RN ?Phone Number: ?07/18/2021, 4:45 PM ? ?Clinical Narrative:    ? ?Transition of Care (TOC) Screening Note ? ? ?Patient Details  ?Name: Abigail [name] ?Date of Birth: [birth year] ? ? ?Transition of Care (TOC) CM/SW Contact:    ?Zenon Mayo, RN ?Phone Number: ?07/18/2021, 4:45 PM ? ? ? ?Transition of Care Department [facility]) has reviewed patient and no TOC needs have been identified at this time. We will continue to monitor patient advancement through interdisciplinary progression rounds. If new patient transition needs arise, please place a TOC consult. ?  ? ? ?  ?  ? ?Expected Discharge Plan and Services ?  ?  ?  ?  ?  ?                ?  ?  ?  ?  ?  ?  ?  ?  ?  ?  ? ? ?Social Determinants of Health (SDOH) Interventions ?  ? ?Readmission Risk Interventions ?   ? View : No data to display.  ?  ?  ?  ? ? ?

## 2021-07-18 NOTE — Telephone Encounter (Signed)
I never received a triage nurse report; however, it does appear that she has been admitted for sepsis.  I will await hospital notes and updates. ?

## 2021-07-18 NOTE — Assessment & Plan Note (Addendum)
-  Sepsis ruled out  ?pt with concerns for SIRS + unclear source of infection vs RMSF from tick bite?  Versus tickborne illness on presentation. ?Diarrhea not c/w RMSF and leukocytosis would be unusual. ?Lack of rash by this point (several days) into the disease is unusual for RMSF but up to 10% of patients may not have a rash so doesn't exclude. ?-Patient with no further diarrhea during the hospitalization. ?Bacterial (non-RMSF) meningitis possible but less likely given multiple days of illness, lack of true meningismus on exam. ?1. Was on the sepsis pathway.  IV fluids saline locked.  ?2. Got cefepime, flagyl, vanc empirically in ED ?3. Patient subsequently placed on IV doxycycline during the hospitalization ?4. ID consulted patient seen in consultation by ID who have ordered ehrlichiosis labs which are currently pending, blood smear review. ?1. Was on oral doxycycline as an outpatient however due to emesis likely vomited up medications.  ?5. Given normal saline 1 L fluid bolus and aggressive fluid resuscitation on admission due to borderline blood pressure.  Patient with some complaints of swelling in her arms on 07/19/2021 and as such IV fluids saline lock.  Patient had good urine output with resolution of swelling in bilateral upper extremities.  ?6. -Blood pressure improved and remained stable.  ?7. Lactate 1.8 ?8. Negative studies so far: ?1. COVID and flu ?2. CXR ?3. UA (not impressive with just trace LE, no nitrites) ?4. CT abdomen and pelvis ?9. Urine cultures done with multiple species present. ?10. -Blood cultures obtained with no growth to date x3 days. ?1. Lyme antibodies (serum) pending at time of discharge ?2. RMSF AB (serum) pending at time of discharge ?3. Ehrlichiosis labs done at time of discharge ?11. EDP didn't feel LP indicated at this time. ?12. -Patient improved clinically and will be discharged on 3-1/2 more days of doxycycline. ?13. Outpatient follow-up with PCP. ?

## 2021-07-18 NOTE — Plan of Care (Signed)
?  Problem: Fluid Volume: ?Goal: Hemodynamic stability will improve ?Outcome: Progressing ?  ?Problem: Education: ?Goal: Knowledge of General Education information will improve ?Description: Including pain rating scale, medication(s)/side effects and non-pharmacologic comfort measures ?Outcome: Progressing ?  ?Problem: Health Behavior/Discharge Planning: ?Goal: Ability to manage health-related needs will improve ?Outcome: Progressing ?  ?Problem: Clinical Measurements: ?Goal: Respiratory complications will improve ?Outcome: Progressing ?Goal: Cardiovascular complication will be avoided ?Outcome: Progressing ?  ?Problem: Clinical Measurements: ?Goal: Ability to maintain clinical measurements within normal limits will improve ?Outcome: Not Progressing ?  ?

## 2021-07-18 NOTE — Telephone Encounter (Signed)
Just now reviewing this report.  It appears the patient had already gone to [facility] ED and is now admitted  ?

## 2021-07-18 NOTE — Telephone Encounter (Signed)
I think that ok to wait for triage.  She is on appropriate treatment for tick-borne illness, but it is completely possible that this is an illness that does not have anything to do with tick-borne illness.   ? ?Currently on doxy 100 mg bid. ? ?I will send to Mrs. Clark, since I am out of the office, and I will rarely be on the computer today.  May only need some zofran or other anti-nausea. ?

## 2021-07-18 NOTE — Telephone Encounter (Signed)
Noted. Will await nurse triage report. ?

## 2021-07-18 NOTE — Telephone Encounter (Signed)
Patient went to [facility] ED ?

## 2021-07-18 NOTE — Telephone Encounter (Signed)
PLEASE NOTE: All timestamps contained within this report are represented as [place] Standard Time. ?CONFIDENTIALTY NOTICE: This fax transmission is intended only for the addressee. It contains information that is legally privileged, confidential or ?otherwise protected from use or disclosure. If you are not the intended recipient, you are strictly prohibited from reviewing, disclosing, copying using ?or disseminating any of this information or taking any action in reliance on or regarding this information. If you have received this fax in error, please ?notify us immediately by telephone so that we can arrange for its return to [place]. Phone: [phone], Toll-Free: [phone], Fax: [phone] ?Page: 1 of 2 ?Call Id: [id number] ?[facility] - Client ?TELEPHONE ADVICE RECORD ?[facility]? ?Patient ?Name: ?Abigail ?E [name] ?E ?Gender: Female ?DOB: 1966-01-25 ?Age: 56 Y 28 M 24 D ?Return ?Phone ?Number: ?[id number] ?(Primary) ?Address: ?City/ ?State/ ?Zip: ?[place] [address] ?Client [facility] ?Client Site [place] - Day ?Provider Alma Friendly - NP ?Contact Type Call ?Who Is Calling Patient / Member / Family / Caregiver ?Call Type Triage / Clinical ?Relationship To Patient Self ?Return Phone Number [phone] (Primary) ?Chief Complaint Headache ?Reason for Call Symptomatic / Request for Health Information ?Initial Comment Caller states she had tick bite last week. She is ?having diarrhea, vomiting, headache. Has urinated ?Translation No ?Nurse Assessment ?Nurse: Volanda Napoleon, RN, Wells Guiles Date/Time Eilene Ghazi Time): 07/17/2021 4:58:05 PM ?Confirm and document reason for call. If ?symptomatic, describe symptoms. ?---Caller states that she got tick bite last monday and ?now she has vomiting, headache, body aches, chills. ?Cannot see if there is a rash because she lives alone. ?Does the patient have any new or worsening ?symptoms? ---Yes ?Will a  triage be completed? ---Yes ?Related visit to physician within the last 2 weeks? ---Yes ?Does the PT have any chronic conditions? (i.e. ?diabetes, asthma, this includes High risk factors for ?pregnancy, etc.) ?---No ?Is this a behavioral health or substance abuse call? ---No ?Guidelines ?Guideline Title Affirmed Question Affirmed Notes Nurse Date/Time (Eastern ?Time) ?Tick Bite [1] 2 to 14 days ?following tick bite ?AND [2] severe ?headache with fever ?occurs ?Volanda Napoleon, RN, Wells Guiles 07/17/2021 4:59:59 ?PM ?Disp. Time (Eastern ?Time) Disposition Final User ?07/17/2021 5:01:23 PM Go to ED Now (or PCP triage) Yes Volanda Napoleon, RN, Wells Guiles ?PLEASE NOTE: All timestamps contained within this report are represented as [place] Standard Time. ?CONFIDENTIALTY NOTICE: This fax transmission is intended only for the addressee. It contains information that is legally privileged, confidential or ?otherwise protected from use or disclosure. If you are not the intended recipient, you are strictly prohibited from reviewing, disclosing, copying using ?or disseminating any of this information or taking any action in reliance on or regarding this information. If you have received this fax in error, please ?notify us immediately by telephone so that we can arrange for its return to [place]. Phone: [phone], Toll-Free: [phone], Fax: [phone] ?Page: 2 of 2 ?Call Id: [id number] ?Caller Disagree/Comply Comply ?Caller Understands Yes ?PreDisposition InappropriateToAsk ?Care Advice Given Per Guideline ?GO TO ED NOW (OR PCP TRIAGE): ?Referrals ?[facility] - ED ?

## 2021-07-18 NOTE — ED Provider Notes (Signed)
? [facility] ?Provider Note ? ? ?CSN: [id number] ?Arrival date & time: 07/17/21  1848 ? ?  ? ?History ? ?Chief Complaint  ?Patient presents with  ? Insect Bite  ? Emesis  ? ? ?Abigail [name] is a 56 y.o. female. ? ?The history is provided by the patient.  ?Emesis ?Severity:  Severe ?Duration:  6 days ?Timing:  Intermittent ?Number of daily episodes:  More than 10 ?Quality:  Stomach contents ?Progression:  Worsening ?Chronicity:  New ?Recent urination:  Normal ?Context: not post-tussive   ?Relieved by:  Nothing ?Worsened by:  Nothing ?Ineffective treatments:  None tried ?Associated symptoms: diarrhea, fever and myalgias   ?Associated symptoms comment:  Sinus issues  ?Risk factors: no travel to endemic areas   ?Patient with DM, high cholesterol presents with nausea and vomiting that started over the past week.  With associated diarrhea.  Also then found a tick on her back, part was removed by the daughter then the rest removed  ?  ?Past Medical History:  ?Diagnosis Date  ? Anxiety   ? Bulging lumbar disc   ? CAP (community acquired pneumonia)   ? Cardiac arrhythmia due to congenital heart disease   ? no arrhythmia identified   ? Celiac disease   ? Depression   ? Diverticulosis   ? Sigmoid colon  ? Fatty liver 05/2016  ? Frequent headaches   ? GERD (gastroesophageal reflux disease)   ? Heart disease   ? History of kidney stones   ? Hypertension   ? Kidney stones   ? Migraine   ? Obesity (BMI 35.0-39.9 without comorbidity)   ? UTI (urinary tract infection)   ?\ ?Home Medications ?Prior to Admission medications   ?Medication Sig Start Date End Date Taking? Authorizing Provider  ?Accu-Chek FastClix Lancets MISC USE 1 LANCET TO CHECK BLOOD SUGAR THREE TIMES DAILY 06/24/21   Clark, Katherine K, NP  ?ACCU-CHEK GUIDE test strip USE ONE STRIP THREE TIMES DAILY 06/24/21   [provider]  ?ADVIL 200 MG tablet Take 200-600 mg by mouth every 6 (six) hours as needed for mild pain  or headache.    [provider]  ?albuterol (PROAIR HFA) 108 (90 Base) MCG/ACT inhaler Inhale 2 puffs into the lungs every 6 (six) hours as needed for wheezing or shortness of breath. 03/04/21   Tower, Marne A, MD  ?Blood Glucose Monitoring Suppl (ACCU-CHEK GUIDE) w/Device KIT by Does not apply route.    [provider]  ?Cholecalciferol (VITAMIN D3) 1.25 MG (50000 UT) CAPS Take 1 capsule by mouth once a week 05/23/21   Clark, Katherine K, NP  ?docusate sodium (COLACE) 100 MG capsule Take 1 capsule (100 mg total) by mouth daily as needed for up to 30 doses. 05/16/21   Gay, Matthew R, MD  ?doxycycline (VIBRA-TABS) 100 MG tablet Take 1 tablet (100 mg total) by mouth 2 (two) times daily. ?Patient taking differently: Take 100 mg by mouth See admin instructions. Bid x 10 days 07/14/21   Copland, Spencer, MD  ?esomeprazole (NEXIUM) 40 MG capsule TAKE 1 CAPSULE BY MOUTH DAILY ?Patient taking differently: As needed 02/24/16   Clark, Katherine K, NP  ?fluconazole (DIFLUCAN) 150 MG tablet Take 1 tab po today, repeat in 7 days if needed 07/14/21   Copland, Spencer, MD  ?FLUoxetine (PROZAC) 40 MG capsule TAKE 1 CAPSULE BY MOUTH ONCE DAILY FOR ANXIETY 07/09/21   Clark, Katherine K, NP  ?HYDROcodone-acetaminophen (NORCO/VICODIN) 5-325 MG tablet Take 1 tablet by   mouth every 4 (four) hours as needed.    [provider]  ?hydrOXYzine (ATARAX/VISTARIL) 10 MG tablet Take 1 to 2 tablets by mouth twice daily as needed for anxiety. 07/04/19   [name], NP  ?Lancets Misc. (ACCU-CHEK FASTCLIX LANCET) KIT Use as directed to test blood sugar daily 10/03/19   [name], NP  ?levothyroxine (SYNTHROID) 75 MCG tablet Take 1 tablet by mouth every morning on an empty stomach with water only.  No food or other medications for 30 minutes. ?Patient taking differently: Take 75 mcg by mouth See admin instructions. Take 75 mcg by mouth every morning on an empty stomach with water only, with no food or other  medications for 30 minutes 02/21/21   [name], NP  ?linaclotide (LINZESS) 145 MCG CAPS capsule TAKE 1 CAPSULE BY MOUTH ONCE DAILY BEFORE BREAKFAST ?Patient taking differently: Take 145 mcg by mouth at bedtime. 01/20/21   Mauri Pole, MD  ?metoprolol tartrate (LOPRESSOR) 25 MG tablet TAKE 1 TABLET BY MOUTH TWICE DAILY . APPOINTMENT REQUIRED FOR FUTURE REFILLS WITH  DR  Ellyn Hack  FOR  FUTURE  REFILLS 07/02/21   Leonie Man, MD  ?ondansetron (ZOFRAN) 4 MG tablet Take 4 mg by mouth every 8 (eight) hours as needed for vomiting or nausea. 05/02/21   [provider]  ?ondansetron (ZOFRAN) 4 MG tablet Take 1 tablet (4 mg total) by mouth every 8 (eight) hours as needed for nausea or vomiting. 07/14/21   Copland, Frederico Hamman, MD  ?oxybutynin (DITROPAN-XL) 5 MG 24 hr tablet Take 5 mg by mouth daily. 12/03/17   [provider]  ?rosuvastatin (CRESTOR) 20 MG tablet Take 1 tablet (20 mg total) by mouth daily. 08/29/20   Leonie Man, MD  ?tamsulosin (FLOMAX) 0.4 MG CAPS capsule Take 0.4 mg by mouth at bedtime. 05/27/20   [provider]  ?traMADol (ULTRAM) 50 MG tablet Take 50 mg by mouth every 4 (four) hours as needed. 07/05/21   [provider]  ?   ? ?Allergies    ?Latex and Penicillins   ? ?Review of Systems   ?Review of Systems  ?Constitutional:  Positive for fever.  ?HENT:  Positive for congestion. Negative for facial swelling.   ?Respiratory:  Negative for wheezing and stridor.   ?Gastrointestinal:  Positive for diarrhea, nausea and vomiting.  ?Musculoskeletal:  Positive for myalgias.  ?Neurological:  Negative for facial asymmetry.  ?All other systems reviewed and are negative. ? ?Physical Exam ?Updated Vital Signs ?BP 140/69   Pulse 86   Temp (!) 102.4 ?F (39.1 ?C) (Oral)   Resp 11   LMP 08/12/2015 Comment: patient states hx. tubal ligation  SpO2 99%  ?Physical Exam ?Vitals and nursing note reviewed.  ?Constitutional:   ?   General: She is not in acute distress. ?    Appearance: Normal appearance. She is not ill-appearing or diaphoretic.  ?HENT:  ?   Head: Normocephalic and atraumatic.  ?   Nose: Congestion present.  ?Eyes:  ?   Conjunctiva/sclera: Conjunctivae normal.  ?   Pupils: Pupils are equal, round, and reactive to light.  ?Cardiovascular:  ?   Rate and Rhythm: Normal rate and regular rhythm.  ?   Pulses: Normal pulses.  ?   Heart sounds: Normal heart sounds.  ?Pulmonary:  ?   Effort: Pulmonary effort is normal.  ?   Breath sounds: Normal breath sounds.  ?Abdominal:  ?   General: Bowel sounds are normal.  ?  Palpations: Abdomen is soft.  ?   Tenderness: There is no abdominal tenderness. There is no guarding or rebound.  ?Musculoskeletal:     ?   General: Normal range of motion.  ?   Cervical back: Normal range of motion and neck supple.  ?Skin: ?   General: Skin is warm and dry.  ?   Capillary Refill: Capillary refill takes less than 2 seconds.  ?   Findings: No rash.  ?Neurological:  ?   General: No focal deficit present.  ?   Mental Status: She is alert and oriented to person, place, and time.  ?   Deep Tendon Reflexes: Reflexes normal.  ?Psychiatric:     ?   Mood and Affect: Mood normal.     ?   Behavior: Behavior normal.  ? ? ?ED Results / Procedures / Treatments   ?Labs ?(all labs ordered are listed, but only abnormal results are displayed) ?Results for orders placed or performed during the hospital encounter of 07/17/21  ?Resp Panel by RT-PCR (Flu A&B, Covid) Nasopharyngeal Swab  ? Specimen: Nasopharyngeal Swab; Nasopharyngeal(NP) swabs in vial transport medium  ?Result Value Ref Range  ? SARS Coronavirus 2 by RT PCR NEGATIVE NEGATIVE  ? Influenza A by PCR NEGATIVE NEGATIVE  ? Influenza B by PCR NEGATIVE NEGATIVE  ?CBC with Differential  ?Result Value Ref Range  ? WBC 20.4 (H) 4.0 - 10.5 K/uL  ? RBC 5.04 3.87 - 5.11 MIL/uL  ? Hemoglobin 15.2 (H) 12.0 - 15.0 g/dL  ? HCT 45.1 36.0 - 46.0 %  ? MCV 89.5 80.0 - 100.0 fL  ? MCH 30.2 26.0 - 34.0 pg  ? MCHC 33.7 30.0 -  36.0 g/dL  ? RDW 13.2 11.5 - 15.5 %  ? Platelets 276 150 - 400 K/uL  ? nRBC 0.0 0.0 - 0.2 %  ? Neutrophils Relative % 95 %  ? Neutro Abs 19.5 (H) 1.7 - 7.7 K/uL  ? Lymphocytes Relative 2 %  ? Lymphs Abs 0.4 (L) 0.7 - 4.

## 2021-07-18 NOTE — Hospital Course (Signed)
HPI per Dr. Alcario Drought ? Abigail [name] is a 56 y.o. female with medical history significant of HTN, obesity, migraine headaches. ?  ?Pt presents to the ED with ~6 day history of N/V/D, fever, body aches, tick found on her back removed by PCP 4 days ago in office.  Also developed severe headache over past 24-48h. ?  ?No abd pain. ?  ?No cough, does have sinus symptoms (nasal congestion). ?  ?Started on doxycycline by PCP but only had 2 doses of this and thinks she vomited both up. ?  ?Tm 102.4 in ED. ?  ?

## 2021-07-18 NOTE — Progress Notes (Signed)
?PROGRESS NOTE ? ? ? ?Abigail [name]  LEX:[id number] DOB: 08-27-1965 DOA: 07/17/2021 ?PCP: [name], NP  ? ? ?Chief Complaint  ?Patient presents with  ? Insect Bite  ? Emesis  ? ? ?Brief Narrative:  ?HPI per Dr. Alcario Drought ? Abigail [name] is a 56 y.o. female with medical history significant of HTN, obesity, migraine headaches. ?  ?Pt presents to the ED with ~6 day history of N/V/D, fever, body aches, tick found on her back removed by PCP 4 days ago in office.  Also developed severe headache over past 24-48h. ?  ?No abd pain. ?  ?No cough, does have sinus symptoms (nasal congestion). ?  ?Started on doxycycline by PCP but only had 2 doses of this and thinks she vomited both up. ?  ?Tm 102.4 in ED. ?   ? ? ?Assessment & Plan: ? Principal Problem: ?  Sepsis ([place]) ?Active Problems: ?  Tick bite of back ?  Hypothyroidism ? ? ? ?Assessment and Plan: ?* Sepsis ([place]) ?Pt with SIRS + unclear source of infection vs RMSF from tick bite? ?Diarrhea not c/w RMSF and leukocytosis would be unusual. ?Lack of rash by this point (several days) into the disease is unusual for RMSF but up to 10% of patients may not have a rash so doesn't exclude. ?Bacterial (non-RMSF) meningitis possible but less likely given multiple days of illness, lack of true meningismus on exam. ?Continue sepsis pathway.   ?Got cefepime, flagyl, vanc empirically in ED ?Continue IV doxycycline ?ID consulted patient seen in consultation by ID who have ordered ehrlichiosis labs, blood smear review. ?On PO doxy as outpt but has been vomiting the medication up ?Given normal saline 1 L fluid bolus and continue maintenance fluids of normal saline at 125 cc an hour as patient with borderline blood pressure.  ?Lactate 1.8 ?Negative studies so far: ?COVID and flu ?CXR ?UA (not impressive with just trace LE, no nitrites) ?Pending studies: ?UCx ?BCx ?Lyme antibodies (serum) ?RMSF AB (serum) ?CT AP ?EDP didn't feel LP indicated at this time. ? ?Tick bite of  back ?Tick removed by PCP x4 days prior to admission.   ?-No surrounding erythema or rash.   ?-See problem #1 sepsis above.   ?-Continue IV doxycycline.   ?-ID consulted and following. ? ?Hypothyroidism ?Continue synthroid ? ? ? ? ?  ? ? ?DVT prophylaxis: SCDs ?Code Status: Full ?Family Communication: Updated patient.  No family at bedside. ?Disposition: Home when clinically improved. ? ?Status is: Inpatient ?Remains inpatient appropriate because: Severity of illness. ?  ?Consultants:  ?ID: Dr. Gale Journey 07/18/2021 ? ?Procedures:  ?CT abdomen and pelvis 07/18/2021 ?Chest x-ray 07/18/2021 ? ?Antimicrobials:  ?IV doxycycline 07/18/2021>>>>> ? ? ?Subjective: ?Patient sitting up on gurney.  Feels a little bit better than on admission.  Still with poor appetite.  Denies any further nausea or emesis or diarrhea today.  No chest pain or shortness of breath. ? ?Objective: ?Vitals:  ? 07/18/21 1345 07/18/21 1419 07/18/21 1521 07/18/21 1649  ?BP: (!) 102/58 107/61  99/63  ?Pulse: 73   70  ?Resp: 16 17  17   ?Temp:  97.8 ?F (36.6 ?C)  97.7 ?F (36.5 ?C)  ?TempSrc:  Oral  Oral  ?SpO2: 97%   95%  ?Weight:   82.7 kg   ? ? ?Intake/Output Summary (Last 24 hours) at 07/18/2021 1909 ?Last data filed at 07/18/2021 1540 ?Gross per 24 hour  ?Intake 4148.84 ml  ?Output --  ?Net 4148.84 ml  ? ?Filed Weights  ?  07/18/21 1521  ?Weight: 82.7 kg  ? ? ?Examination: ? ?General exam: Appears calm and comfortable  ?Respiratory system: Clear to auscultation. Respiratory effort normal. ?Cardiovascular system: S1 & S2 heard, RRR. No JVD, murmurs, rubs, gallops or clicks. No pedal edema. ?Gastrointestinal system: Abdomen is nondistended, soft and nontender. No organomegaly or masses felt. Normal bowel sounds heard. ?Central nervous system: Alert and oriented. No focal neurological deficits. ?Extremities: Symmetric 5 x 5 power. ?Skin: No rashes, lesions or ulcers ?Psychiatry: Judgement and insight appear normal. Mood & affect appropriate.  ? ? ? ?Data Reviewed:   ? ?CBC: ?Recent Labs  ?Lab 07/17/21 ?1926  ?WBC 20.4*  ?NEUTROABS 19.5*  ?HGB 15.2*  ?HCT 45.1  ?MCV 89.5  ?PLT 276  ? ? ?Basic Metabolic Panel: ?Recent Labs  ?Lab 07/17/21 ?1926 07/18/21 ?1610  ?NA 142 140  ?K 3.8 3.6  ?CL 110 104  ?CO2 21* 25  ?GLUCOSE 121* 124*  ?BUN 15 13  ?CREATININE 0.89 0.86  ?CALCIUM 9.5 9.8  ? ? ?GFR: ?Estimated Creatinine Clearance: 72.8 mL/min (by C-G formula based on SCr of 0.86 mg/dL). ? ?Liver Function Tests: ?Recent Labs  ?Lab 07/18/21 ?0224  ?AST 21  ?ALT 29  ?ALKPHOS 93  ?BILITOT 1.6*  ?PROT 7.5  ?ALBUMIN 4.2  ? ? ?CBG: ?No results for input(s): GLUCAP in the last 168 hours. ? ? ?Recent Results (from the past 240 hour(s))  ?Resp Panel by RT-PCR (Flu A&B, Covid) Nasopharyngeal Swab     Status: None  ? Collection Time: 07/18/21  2:24 AM  ? Specimen: Nasopharyngeal Swab; Nasopharyngeal(NP) swabs in vial transport medium  ?Result Value Ref Range Status  ? SARS Coronavirus 2 by RT PCR NEGATIVE NEGATIVE Final  ?  Comment: (NOTE) ?SARS-CoV-2 target nucleic acids are NOT DETECTED. ? ?The SARS-CoV-2 RNA is generally detectable in upper respiratory ?specimens during the acute phase of infection. The lowest ?concentration of SARS-CoV-2 viral copies this assay can detect is ?138 copies/mL. A negative result does not preclude SARS-Cov-2 ?infection and should not be used as the sole basis for treatment or ?other patient management decisions. A negative result may occur with  ?improper specimen collection/handling, submission of specimen other ?than nasopharyngeal swab, presence of viral mutation(s) within the ?areas targeted by this assay, and inadequate number of viral ?copies(<138 copies/mL). A negative result must be combined with ?clinical observations, patient history, and epidemiological ?information. The expected result is Negative. ? ?Fact Sheet for Patients:  ?EntrepreneurPulse.com.au ? ?Fact Sheet for Healthcare Providers:   ?IncredibleEmployment.be ? ?This test is no t yet approved or cleared by the [place] FDA and  ?has been authorized for detection and/or diagnosis of SARS-CoV-2 by ?FDA under an Emergency Use Authorization (EUA). This EUA will remain  ?in effect (meaning this test can be used) for the duration of the ?COVID-19 declaration under Section 564(b)(1) of the Act, 21 ?U.S.C.section 360bbb-3(b)(1), unless the authorization is terminated  ?or revoked sooner.  ? ? ?  ? Influenza A by PCR NEGATIVE NEGATIVE Final  ? Influenza B by PCR NEGATIVE NEGATIVE Final  ?  Comment: (NOTE) ?The Xpert Xpress SARS-CoV-2/FLU/RSV plus assay is intended as an aid ?in the diagnosis of influenza from Nasopharyngeal swab specimens and ?should not be used as a sole basis for treatment. Nasal washings and ?aspirates are unacceptable for Xpert Xpress SARS-CoV-2/FLU/RSV ?testing. ? ?Fact Sheet for Patients: ?EntrepreneurPulse.com.au ? ?Fact Sheet for Healthcare Providers: ?IncredibleEmployment.be ? ?This test is not yet approved or cleared by the [place] and ?has  been authorized for detection and/or diagnosis of SARS-CoV-2 by ?FDA under an Emergency Use Authorization (EUA). This EUA will remain ?in effect (meaning this test can be used) for the duration of the ?COVID-19 declaration under Section 564(b)(1) of the Act, 21 U.S.C. ?section 360bbb-3(b)(1), unless the authorization is terminated or ?revoked. ? ?Performed at [facility], [place] [address] [place], [place] ?[address] ?  ?Blood Culture (routine x 2)     Status: None (Preliminary result)  ? Collection Time: 07/18/21  2:24 AM  ? Specimen: BLOOD  ?Result Value Ref Range Status  ? Specimen Description BLOOD RIGHT ANTECUBITAL  Final  ? Special Requests   Final  ?  BOTTLES DRAWN AEROBIC AND ANAEROBIC Blood Culture results may not be optimal due to an excessive volume of blood received in culture bottles  ? Culture   Final  ?   NO GROWTH < 12 HOURS ?Performed at [facility], [place] [address], [place], [place] [address] ?  ? Report Status PENDING  Incomplete  ?Blood Culture (routine x 2)     Status: None (Preliminary result)  ? Collection Time: 07/18/21  2:27 AM  ? Specim

## 2021-07-18 NOTE — Assessment & Plan Note (Addendum)
-  Patient maintained on home regimen Synthroid.    ?

## 2021-07-18 NOTE — Sepsis Progress Note (Signed)
Monitoring for the code sepsis protocol. °

## 2021-07-19 DIAGNOSIS — E876 Hypokalemia: Secondary | ICD-10-CM

## 2021-07-19 DIAGNOSIS — R519 Headache, unspecified: Secondary | ICD-10-CM

## 2021-07-19 HISTORY — DX: Hypomagnesemia: E83.42

## 2021-07-19 HISTORY — DX: Hypokalemia: E87.6

## 2021-07-19 LAB — COMPREHENSIVE METABOLIC PANEL
ALT: 20 U/L (ref 0–44)
AST: 16 U/L (ref 15–41)
Albumin: 2.7 g/dL — ABNORMAL LOW (ref 3.5–5.0)
Alkaline Phosphatase: 53 U/L (ref 38–126)
Anion gap: 6 (ref 5–15)
BUN: 5 mg/dL — ABNORMAL LOW (ref 6–20)
CO2: 23 mmol/L (ref 22–32)
Calcium: 8.2 mg/dL — ABNORMAL LOW (ref 8.9–10.3)
Chloride: 110 mmol/L (ref 98–111)
Creatinine, Ser: 0.56 mg/dL (ref 0.44–1.00)
GFR, Estimated: >60 mL/min (ref >60)
Glucose, Bld: 85 mg/dL (ref 70–99)
Potassium: 3.1 mmol/L — ABNORMAL LOW (ref 3.5–5.1)
Sodium: 139 mmol/L (ref 135–145)
Total Bilirubin: 1.4 mg/dL — ABNORMAL HIGH (ref 0.3–1.2)
Total Protein: 4.9 g/dL — ABNORMAL LOW (ref 6.5–8.1)

## 2021-07-19 LAB — CBC WITH DIFFERENTIAL/PLATELET
Abs Immature Granulocytes: 0.03 10*3/uL (ref 0.00–0.07)
Basophils Absolute: 0 10*3/uL (ref 0.0–0.1)
Basophils Relative: 1 %
Eosinophils Absolute: 0.3 10*3/uL (ref 0.0–0.5)
Eosinophils Relative: 5 %
HCT: 33.3 % — ABNORMAL LOW (ref 36.0–46.0)
Hemoglobin: 11.1 g/dL — ABNORMAL LOW (ref 12.0–15.0)
Immature Granulocytes: 1 %
Lymphocytes Relative: 17 %
Lymphs Abs: 1 10*3/uL (ref 0.7–4.0)
MCH: 30 pg (ref 26.0–34.0)
MCHC: 33.3 g/dL (ref 30.0–36.0)
MCV: 90 fL (ref 80.0–100.0)
Monocytes Absolute: 0.5 10*3/uL (ref 0.1–1.0)
Monocytes Relative: 7 %
Neutro Abs: 4.3 10*3/uL (ref 1.7–7.7)
Neutrophils Relative %: 69 %
Platelets: 173 10*3/uL (ref 150–400)
RBC: 3.7 MIL/uL — ABNORMAL LOW (ref 3.87–5.11)
RDW: 13.4 % (ref 11.5–15.5)
WBC: 6.2 10*3/uL (ref 4.0–10.5)
nRBC: 0 % (ref 0.0–0.2)

## 2021-07-19 LAB — URINE CULTURE

## 2021-07-19 LAB — MRSA NEXT GEN BY PCR, NASAL: MRSA by PCR Next Gen: NOT DETECTED

## 2021-07-19 LAB — MAGNESIUM: Magnesium: 1.4 mg/dL — ABNORMAL LOW (ref 1.7–2.4)

## 2021-07-19 MED ORDER — KETOROLAC TROMETHAMINE 30 MG/ML IJ SOLN
30.0000 mg | Freq: Four times a day (QID) | INTRAMUSCULAR | Status: DC | PRN
  Administered 2021-07-19 – 2021-07-20 (×3): 30 mg via INTRAVENOUS
  Filled 2021-07-19 (×3): qty 1

## 2021-07-19 MED ORDER — MAGNESIUM SULFATE 4 GM/100ML IV SOLN
4.0000 g | Freq: Once | INTRAVENOUS | Status: AC
  Administered 2021-07-19: 4 g via INTRAVENOUS
  Filled 2021-07-19: qty 100

## 2021-07-19 MED ORDER — KETOROLAC TROMETHAMINE 30 MG/ML IJ SOLN
30.0000 mg | Freq: Once | INTRAMUSCULAR | Status: AC
Start: 2021-07-19 — End: 2021-07-19
  Administered 2021-07-19: 30 mg via INTRAVENOUS
  Filled 2021-07-19: qty 1

## 2021-07-19 MED ORDER — POTASSIUM CHLORIDE CRYS ER 20 MEQ PO TBCR
40.0000 meq | EXTENDED_RELEASE_TABLET | ORAL | Status: AC
  Administered 2021-07-19 (×2): 40 meq via ORAL
  Filled 2021-07-19 (×2): qty 2

## 2021-07-19 NOTE — Assessment & Plan Note (Addendum)
-   Patient with complaints of headache and pain in her neck and shoulder blades. ?-Patient noted to have a history of frequent headaches however patient denies any daily headaches. ?-Patient stated current headache is different from any headaches she has had previously in the past, denies any feelings of a sinus headache or migrainous headache. ?-States no significant improvement with headaches on pain regimen of Vicodin and IV Toradol during the hospitalization. ?-Head CT done negative for any acute abnormalities. ?-Patient maintained on Zofran, pain regimen changed to oxycodone 5 mg every 4 hours as needed with clinical improvement. ?-Patient will be discharged on oxycodone 5 mg every 4 hours as needed (number 10 tablets ordered.) ?-Patient's home regimen of Vicodin discontinued on discharge. ?-Outpatient follow-up with PCP. ?

## 2021-07-19 NOTE — Assessment & Plan Note (Addendum)
-   Likely secondary to GI losses. ?-Repleted during the hospitalization.   ?-Outpatient follow-up.   ?

## 2021-07-19 NOTE — Assessment & Plan Note (Addendum)
-   Likely secondary to GI losses.   ?-Repleted during the hospitalization.   ?

## 2021-07-19 NOTE — Plan of Care (Signed)
Discussed with patient plan of care for the evening, pain management and medication for headache control tonight with some teach back displayed ? ? ?Problem: Education: ?Goal: Knowledge of General Education information will improve ?Description: Including pain rating scale, medication(s)/side effects and non-pharmacologic comfort measures ?Outcome: Progressing ?  ?

## 2021-07-19 NOTE — Plan of Care (Signed)
Discussed with patient plan of care for the evening, pain management and importance of mobility with some teach back displayed.   ? ?Problem: Education: ?Goal: Knowledge of General Education information will improve ?Description: Including pain rating scale, medication(s)/side effects and non-pharmacologic comfort measures ?Outcome: Progressing ?  ?Problem: Health Behavior/Discharge Planning: ?Goal: Ability to manage health-related needs will improve ?Outcome: Progressing ?  ?

## 2021-07-19 NOTE — Progress Notes (Signed)
?PROGRESS NOTE ? ? ? ?Abigail [name]  OAC:[id number] DOB: [birth year] DOA: 07/17/2021 ?PCP: [name], NP  ? ? ?Chief Complaint  ?Patient presents with  ? Insect Bite  ? Emesis  ? ? ?Brief Narrative:  ?HPI per Dr. Alcario Drought ? Abigail [name] is a 56 y.o. female with medical history significant of HTN, obesity, migraine headaches. ?  ?Pt presents to the ED with ~6 day history of N/V/D, fever, body aches, tick found on her back removed by PCP 4 days ago in office.  Also developed severe headache over past 24-48h. ?  ?No abd pain. ?  ?No cough, does have sinus symptoms (nasal congestion). ?  ?Started on doxycycline by PCP but only had 2 doses of this and thinks she vomited both up. ?  ?Tm 102.4 in ED. ?   ? ? ?Assessment & Plan: ? Principal Problem: ?  Sepsis ([place]) ?Active Problems: ?  Tick bite of back ?  Hypothyroidism ?  Frequent headaches ?  Hypomagnesemia ?  Hypokalemia ? ? ? ?Assessment and Plan: ?* Sepsis ([place]) ?Pt with SIRS + unclear source of infection vs RMSF from tick bite? ?Diarrhea not c/w RMSF and leukocytosis would be unusual. ?Lack of rash by this point (several days) into the disease is unusual for RMSF but up to 10% of patients may not have a rash so doesn't exclude. ?Bacterial (non-RMSF) meningitis possible but less likely given multiple days of illness, lack of true meningismus on exam. ?Was on the sepsis pathway.  We will saline lock IV fluids. ?Got cefepime, flagyl, vanc empirically in ED ?Continue IV doxycycline ?ID consulted patient seen in consultation by ID who have ordered ehrlichiosis labs which are currently pending, blood smear review. ?On PO doxy as outpt but has been vomiting the medication up ?Given normal saline 1 L fluid bolus and aggressive fluid resuscitation on admission due to borderline blood pressure.  Patient with some complaints of swelling in her arms.  Blood pressure improved.  Saline lock IV fluids. ?Lactate 1.8 ?Negative studies so far: ?COVID and  flu ?CXR ?UA (not impressive with just trace LE, no nitrites) ?CT abdomen and pelvis ?Pending studies: ?UCx ?BCx ?Lyme antibodies (serum) ?RMSF AB (serum) ?Ehrlichiosis labs ?EDP didn't feel LP indicated at this time. ? ?Tick bite of back ?Tick removed by PCP x4 days prior to admission.   ?-No surrounding erythema or rash.   ?-See problem #1 sepsis above.   ?-Continue IV doxycycline.   ?-ID consulted and following. ? ?Hypothyroidism ?-Continue home regimen Synthroid.  ? ?Hypokalemia ?- Potassium at 3.1. ?-Magnesium at 1.4. ?-K-Dur 40 mEq p.o. every 4 hours x2 doses. ?-Replete magnesium to keep magnesium approximately at 2. ? ?Hypomagnesemia ?- Likely secondary to GI losses. ?-Magnesium at 1.4. ?-Magnesium sulfate 4 g IV x1. ?-Repeat labs in the AM. ? ?Frequent headaches ?- Patient with complaints of headache and pain in her neck and shoulder blades. ?-Patient noted to have a history of frequent headaches. ?-Patient just received IV Zofran. ?-Placed on IV Toradol as needed give a dose now. ? ? ? ? ?  ? ? ?DVT prophylaxis: SCDs ?Code Status: Full ?Family Communication: Updated patient.  No family at bedside. ?Disposition: Home when clinically improved hopefully in the next 1 to 2 days. ? ?Status is: Inpatient ?Remains inpatient appropriate because: Severity of illness. ?  ?Consultants:  ?ID: Dr. Gale Journey 07/18/2021 ? ?Procedures:  ?CT abdomen and pelvis 07/18/2021 ?Chest x-ray 07/18/2021 ? ?Antimicrobials:  ?IV doxycycline 07/18/2021>>>>> ? ? ?Subjective: ?  Patient laying in bed.  States having to pee a lot.  No chest pain.  No shortness of breath.  No abdominal pain.  Stated had a bout of watery loose stool this morning.  Complaining of swelling in bilateral hands.  Complain of a headache around the frontal part of her head, some neck pain and pain in her shoulder blades with no improvement on Tylenol.  Does not feel significantly better than she did on admission.  Still with a poor appetite.  Still with complaints of nausea.   Tolerated clear liquids.  ? ?Objective: ?Vitals:  ? 07/18/21 2319 07/19/21 0314 07/19/21 0529 07/19/21 0726  ?BP: 112/68 101/60  128/73  ?Pulse: 81     ?Resp: 17 16 17 15   ?Temp: 97.6 ?F (36.4 ?C) 98.3 ?F (36.8 ?C)  98.6 ?F (37 ?C)  ?TempSrc: Oral Oral  Oral  ?SpO2: 96% 95%  97%  ?Weight: 84.6 kg     ?Height: 5' 2"  (1.575 m)     ? ? ?Intake/Output Summary (Last 24 hours) at 07/19/2021 0950 ?Last data filed at 07/19/2021 0500 ?Gross per 24 hour  ?Intake 3411.39 ml  ?Output --  ?Net 3411.39 ml  ? ?Filed Weights  ? 07/18/21 1521 07/18/21 2319  ?Weight: 82.7 kg 84.6 kg  ? ? ?Examination: ? ?General exam: NAD. ?Respiratory system: Lungs clear to auscultation bilaterally.  No wheezes, no crackles, no rhonchi.  Fair air movement.  Speaking in full sentences.   ?Cardiovascular system: Regular rate rhythm no murmurs rubs or gallops.  No JVD.  No lower extremity edema. ?Gastrointestinal system: Abdomen is nondistended, soft and nontender. No organomegaly or masses felt. Normal bowel sounds heard. ?Central nervous system: Alert and oriented. No focal neurological deficits. ?Extremities: Symmetric 5 x 5 power. ?Skin: No rashes, lesions or ulcers ?Psychiatry: Judgement and insight appear normal. Mood & affect appropriate.  ? ? ? ?Data Reviewed:  ? ?CBC: ?Recent Labs  ?Lab 07/17/21 ?1926 07/19/21 ?5102  ?WBC 20.4* 6.2  ?NEUTROABS 19.5* 4.3  ?HGB 15.2* 11.1*  ?HCT 45.1 33.3*  ?MCV 89.5 90.0  ?PLT 276 173  ? ? ?Basic Metabolic Panel: ?Recent Labs  ?Lab 07/17/21 ?1926 07/18/21 ?0224 07/19/21 ?5852  ?NA 142 140 139  ?K 3.8 3.6 3.1*  ?CL 110 104 110  ?CO2 21* 25 23  ?GLUCOSE 121* 124* 85  ?BUN 15 13 5*  ?CREATININE 0.89 0.86 0.56  ?CALCIUM 9.5 9.8 8.2*  ?MG  --   --  1.4*  ? ? ?GFR: ?Estimated Creatinine Clearance: 79.2 mL/min (by C-G formula based on SCr of 0.56 mg/dL). ? ?Liver Function Tests: ?Recent Labs  ?Lab 07/18/21 ?0224 07/19/21 ?7782  ?AST 21 16  ?ALT 29 20  ?ALKPHOS 93 53  ?BILITOT 1.6* 1.4*  ?PROT 7.5 4.9*  ?ALBUMIN 4.2 2.7*   ? ? ?CBG: ?No results for input(s): GLUCAP in the last 168 hours. ? ? ?Recent Results (from the past 240 hour(s))  ?Resp Panel by RT-PCR (Flu A&B, Covid) Nasopharyngeal Swab     Status: None  ? Collection Time: 07/18/21  2:24 AM  ? Specimen: Nasopharyngeal Swab; Nasopharyngeal(NP) swabs in vial transport medium  ?Result Value Ref Range Status  ? SARS Coronavirus 2 by RT PCR NEGATIVE NEGATIVE Final  ?  Comment: (NOTE) ?SARS-CoV-2 target nucleic acids are NOT DETECTED. ? ?The SARS-CoV-2 RNA is generally detectable in upper respiratory ?specimens during the acute phase of infection. The lowest ?concentration of SARS-CoV-2 viral copies this assay can detect is ?138 copies/mL. A negative  result does not preclude SARS-Cov-2 ?infection and should not be used as the sole basis for treatment or ?other patient management decisions. A negative result may occur with  ?improper specimen collection/handling, submission of specimen other ?than nasopharyngeal swab, presence of viral mutation(s) within the ?areas targeted by this assay, and inadequate number of viral ?copies(<138 copies/mL). A negative result must be combined with ?clinical observations, patient history, and epidemiological ?information. The expected result is Negative. ? ?Fact Sheet for Patients:  ?EntrepreneurPulse.com.au ? ?Fact Sheet for Healthcare Providers:  ?IncredibleEmployment.be ? ?This test is no t yet approved or cleared by the [place] FDA and  ?has been authorized for detection and/or diagnosis of SARS-CoV-2 by ?FDA under an Emergency Use Authorization (EUA). This EUA will remain  ?in effect (meaning this test can be used) for the duration of the ?COVID-19 declaration under Section 564(b)(1) of the Act, 21 ?U.S.C.section 360bbb-3(b)(1), unless the authorization is terminated  ?or revoked sooner.  ? ? ?  ? Influenza A by PCR NEGATIVE NEGATIVE Final  ? Influenza B by PCR NEGATIVE NEGATIVE Final  ?  Comment:  (NOTE) ?The Xpert Xpress SARS-CoV-2/FLU/RSV plus assay is intended as an aid ?in the diagnosis of influenza from Nasopharyngeal swab specimens and ?should not be used as a sole basis for treatment. Nasal washings an

## 2021-07-20 ENCOUNTER — Inpatient Hospital Stay (HOSPITAL_COMMUNITY): Payer: 59

## 2021-07-20 LAB — CBC WITH DIFFERENTIAL/PLATELET
Abs Immature Granulocytes: 0.02 10*3/uL (ref 0.00–0.07)
Basophils Absolute: 0 10*3/uL (ref 0.0–0.1)
Basophils Relative: 1 %
Eosinophils Absolute: 0.3 10*3/uL (ref 0.0–0.5)
Eosinophils Relative: 5 %
HCT: 35.9 % — ABNORMAL LOW (ref 36.0–46.0)
Hemoglobin: 11.6 g/dL — ABNORMAL LOW (ref 12.0–15.0)
Immature Granulocytes: 0 %
Lymphocytes Relative: 29 %
Lymphs Abs: 1.7 10*3/uL (ref 0.7–4.0)
MCH: 29.4 pg (ref 26.0–34.0)
MCHC: 32.3 g/dL (ref 30.0–36.0)
MCV: 90.9 fL (ref 80.0–100.0)
Monocytes Absolute: 0.5 10*3/uL (ref 0.1–1.0)
Monocytes Relative: 8 %
Neutro Abs: 3.4 10*3/uL (ref 1.7–7.7)
Neutrophils Relative %: 57 %
Platelets: 197 10*3/uL (ref 150–400)
RBC: 3.95 MIL/uL (ref 3.87–5.11)
RDW: 13.1 % (ref 11.5–15.5)
WBC: 5.9 10*3/uL (ref 4.0–10.5)
nRBC: 0 % (ref 0.0–0.2)

## 2021-07-20 LAB — COMPREHENSIVE METABOLIC PANEL
ALT: 20 U/L (ref 0–44)
AST: 17 U/L (ref 15–41)
Albumin: 2.8 g/dL — ABNORMAL LOW (ref 3.5–5.0)
Alkaline Phosphatase: 56 U/L (ref 38–126)
Anion gap: 8 (ref 5–15)
BUN: <5 mg/dL — ABNORMAL LOW (ref 6–20)
CO2: 28 mmol/L (ref 22–32)
Calcium: 8.3 mg/dL — ABNORMAL LOW (ref 8.9–10.3)
Chloride: 106 mmol/L (ref 98–111)
Creatinine, Ser: 0.67 mg/dL (ref 0.44–1.00)
GFR, Estimated: >60 mL/min (ref >60)
Glucose, Bld: 90 mg/dL (ref 70–99)
Potassium: 3.7 mmol/L (ref 3.5–5.1)
Sodium: 142 mmol/L (ref 135–145)
Total Bilirubin: 0.9 mg/dL (ref 0.3–1.2)
Total Protein: 5.2 g/dL — ABNORMAL LOW (ref 6.5–8.1)

## 2021-07-20 LAB — MAGNESIUM: Magnesium: 2 mg/dL (ref 1.7–2.4)

## 2021-07-20 MED ORDER — PROCHLORPERAZINE EDISYLATE 10 MG/2ML IJ SOLN
10.0000 mg | Freq: Four times a day (QID) | INTRAMUSCULAR | Status: DC | PRN
  Administered 2021-07-20: 10 mg via INTRAVENOUS
  Filled 2021-07-20 (×2): qty 2

## 2021-07-20 MED ORDER — ONDANSETRON HCL 4 MG/2ML IJ SOLN: 4.0000 mg | Freq: Four times a day (QID) | INTRAMUSCULAR | Status: DC | PRN

## 2021-07-20 MED ORDER — OXYCODONE HCL 5 MG PO TABS
5.0000 mg | ORAL_TABLET | ORAL | Status: DC | PRN
Start: 2021-07-20 — End: 2021-07-21

## 2021-07-20 MED ORDER — OXYBUTYNIN CHLORIDE ER 5 MG PO TB24
5.0000 mg | ORAL_TABLET | Freq: Every day | ORAL | Status: DC
  Filled 2021-07-20: qty 1

## 2021-07-20 NOTE — Progress Notes (Signed)
?PROGRESS NOTE ? ? ? ?Alexsus Papadopoulos Mcloughlin  VOH:[id number] DOB: September 21, 1965 DOA: 07/17/2021 ?PCP: [name], NP  ? ? ?Chief Complaint  ?Patient presents with  ? Insect Bite  ? Emesis  ? ? ?Brief Narrative:  ?HPI per Dr. Alcario Drought ? Abigail [name] is a 56 y.o. female with medical history significant of HTN, obesity, migraine headaches. ?  ?Pt presents to the ED with ~6 day history of N/V/D, fever, body aches, tick found on her back removed by PCP 4 days ago in office.  Also developed severe headache over past 24-48h. ?  ?No abd pain. ?  ?No cough, does have sinus symptoms (nasal congestion). ?  ?Started on doxycycline by PCP but only had 2 doses of this and thinks she vomited both up. ?  ?Tm 102.4 in ED. ?   ? ? ?Assessment & Plan: ? Principal Problem: ?  Sepsis ([place]) ?Active Problems: ?  Tick bite of back ?  Hypothyroidism ?  Frequent headaches ?  Hypomagnesemia ?  Hypokalemia ? ? ? ?Assessment and Plan: ?* Sepsis ([place]) ?Pt with SIRS + unclear source of infection vs RMSF from tick bite?  Versus tickborne illness. ?Diarrhea not c/w RMSF and leukocytosis would be unusual. ?Lack of rash by this point (several days) into the disease is unusual for RMSF but up to 10% of patients may not have a rash so doesn't exclude. ?-Patient with no further diarrhea. ?Bacterial (non-RMSF) meningitis possible but less likely given multiple days of illness, lack of true meningismus on exam. ?Was on the sepsis pathway.  IV fluids saline locked.  ?Got cefepime, flagyl, vanc empirically in ED ?Continue IV doxycycline ?ID consulted patient seen in consultation by ID who have ordered ehrlichiosis labs which are currently pending, blood smear review. ?Was on oral doxycycline as an outpatient however due to emesis likely vomited up medications.  ?Given normal saline 1 L fluid bolus and aggressive fluid resuscitation on admission due to borderline blood pressure.  Patient with some complaints of swelling in her arms on 07/19/2021 and  as such IV fluids saline lock.  Blood pressure improved and stable.[name] ?Lactate 1.8 ?Negative studies so far: ?COVID and flu ?CXR ?UA (not impressive with just trace LE, no nitrites) ?CT abdomen and pelvis ?Pending studies: ?UCx ?BCx ?Lyme antibodies (serum) ?RMSF AB (serum) ?Ehrlichiosis labs ?EDP didn't feel LP indicated at this time. ? ?Tick bite of back ?Tick removed by PCP x4 days prior to admission.   ?-No surrounding erythema or rash.   ?-See problem #1 sepsis above.   ?-Continue IV doxycycline.   ?-ID consulted and following. ? ?Hypothyroidism ?-Synthroid.    ? ?Hypokalemia ?- Potassium currently at 3.7 from 3.1.   ?-Magnesium repleted.   ?-Repeat labs in the AM.   ? ?Hypomagnesemia ?- Likely secondary to GI losses. ?-Magnesium currently at 2.0 from 1.4.   ?-Follow. ? ?Frequent headaches ?- Patient with complaints of headache and pain in her neck and shoulder blades. ?-Patient noted to have a history of frequent headaches however patient denies any daily headaches. ?-Patient states current headache is different from any headaches she has had previously in the past, denies any feelings of a sinus headache or migrainous headache. ?-States no significant improvement with headaches on current pain regimen. ?-Check head CT. ?-Discontinue Zofran and place on IV Compazine. ?-Continue Toradol as needed. ? ? ? ? ?  ? ? ?DVT prophylaxis: SCDs ?Code Status: Full ?Family Communication: Updated patient.  Updated son on patient's telephone/speakerphone.  ?Disposition:  Home when clinically improved hopefully in the next 1 to 2 days. ? ?Status is: Inpatient ?Remains inpatient appropriate because: Severity of illness. ?  ?Consultants:  ?ID: Dr. Gale Journey 07/18/2021 ? ?Procedures:  ?CT abdomen and pelvis 07/18/2021 ?Chest x-ray 07/18/2021 ? ?Antimicrobials:  ?IV doxycycline 07/18/2021>>>>> ? ? ?Subjective: ?Sitting up in bed.  Still complaining of headache.  Denies any phonophobia or photophobia.  Also complaining of pain in the neck  shoulders and sometimes in the front part of her face as well as frontal head.  States is different from any headache she has had before in the past.  Still with some nausea but no emesis, no diarrhea.  Able to keep soft diet down this morning.  Anxious about her test results. ? ? ?Objective: ?Vitals:  ? 07/19/21 2257 07/19/21 2300 07/20/21 0303 07/20/21 0730  ?BP: 121/68  117/70 124/75  ?Pulse: 64     ?Resp: 18 15 15 15   ?Temp: 98.4 ?F (36.9 ?C)  98.4 ?F (36.9 ?C) 98.7 ?F (37.1 ?C)  ?TempSrc: Oral  Oral Oral  ?SpO2:  96% 93% 97%  ?Weight:      ?Height:      ? ? ?Intake/Output Summary (Last 24 hours) at 07/20/2021 0951 ?Last data filed at 07/20/2021 0400 ?Gross per 24 hour  ?Intake 840.76 ml  ?Output --  ?Net 840.76 ml  ? ?Filed Weights  ? 07/18/21 1521 07/18/21 2319  ?Weight: 82.7 kg 84.6 kg  ? ? ?Examination: ? ?General exam: NAD. ?Respiratory system: CTA B.  No wheezes, no crackles, no rhonchi.  Fair air movement.  Speaking in full sentences.  ?Cardiovascular system: RRR no murmurs rubs or gallops.  No JVD.  No lower extremity edema.  ?Gastrointestinal system: Abdomen is soft, nontender, nondistended, positive bowel sounds.  No rebound.  No guarding.   ?Central nervous system: Alert and oriented. No focal neurological deficits. ?Extremities: Symmetric 5 x 5 power. ?Skin: No rashes, lesions or ulcers ?Psychiatry: Judgement and insight appear normal. Mood & affect appropriate.  ? ? ? ?Data Reviewed:  ? ?CBC: ?Recent Labs  ?Lab 07/17/21 ?1926 07/19/21 ?8756 07/20/21 ?0041  ?WBC 20.4* 6.2 5.9  ?NEUTROABS 19.5* 4.3 3.4  ?HGB 15.2* 11.1* 11.6*  ?HCT 45.1 33.3* 35.9*  ?MCV 89.5 90.0 90.9  ?PLT 276 173 197  ? ? ?Basic Metabolic Panel: ?Recent Labs  ?Lab 07/17/21 ?1926 07/18/21 ?0224 07/19/21 ?4332 07/20/21 ?0041  ?NA 142 140 139 142  ?K 3.8 3.6 3.1* 3.7  ?CL 110 104 110 106  ?CO2 21* 25 23 28   ?GLUCOSE 121* 124* 85 90  ?BUN 15 13 5* <5*  ?CREATININE 0.89 0.86 0.56 0.67  ?CALCIUM 9.5 9.8 8.2* 8.3*  ?MG  --   --  1.4* 2.0   ? ? ?GFR: ?Estimated Creatinine Clearance: 79.2 mL/min (by C-G formula based on SCr of 0.67 mg/dL). ? ?Liver Function Tests: ?Recent Labs  ?Lab 07/18/21 ?0224 07/19/21 ?9518 07/20/21 ?0041  ?AST 21 16 17   ?ALT 29 20 20   ?ALKPHOS 93 53 56  ?BILITOT 1.6* 1.4* 0.9  ?PROT 7.5 4.9* 5.2*  ?ALBUMIN 4.2 2.7* 2.8*  ? ? ?CBG: ?No results for input(s): GLUCAP in the last 168 hours. ? ? ?Recent Results (from the past 240 hour(s))  ?Resp Panel by RT-PCR (Flu A&B, Covid) Nasopharyngeal Swab     Status: None  ? Collection Time: 07/18/21  2:24 AM  ? Specimen: Nasopharyngeal Swab; Nasopharyngeal(NP) swabs in vial transport medium  ?Result Value Ref Range Status  ? SARS Coronavirus  2 by RT PCR NEGATIVE NEGATIVE Final  ?  Comment: (NOTE) ?SARS-CoV-2 target nucleic acids are NOT DETECTED. ? ?The SARS-CoV-2 RNA is generally detectable in upper respiratory ?specimens during the acute phase of infection. The lowest ?concentration of SARS-CoV-2 viral copies this assay can detect is ?138 copies/mL. A negative result does not preclude SARS-Cov-2 ?infection and should not be used as the sole basis for treatment or ?other patient management decisions. A negative result may occur with  ?improper specimen collection/handling, submission of specimen other ?than nasopharyngeal swab, presence of viral mutation(s) within the ?areas targeted by this assay, and inadequate number of viral ?copies(<138 copies/mL). A negative result must be combined with ?clinical observations, patient history, and epidemiological ?information. The expected result is Negative. ? ?Fact Sheet for Patients:  ?EntrepreneurPulse.com.au ? ?Fact Sheet for Healthcare Providers:  ?IncredibleEmployment.be ? ?This test is no t yet approved or cleared by the [place] FDA and  ?has been authorized for detection and/or diagnosis of SARS-CoV-2 by ?FDA under an Emergency Use Authorization (EUA). This EUA will remain  ?in effect (meaning this test  can be used) for the duration of the ?COVID-19 declaration under Section 564(b)(1) of the Act, 21 ?U.S.C.section 360bbb-3(b)(1), unless the authorization is terminated  ?or revoked sooner.  ? ? ?  ? Influen

## 2021-07-20 NOTE — Plan of Care (Signed)
?  Problem: Clinical Measurements: ?Goal: Signs and symptoms of infection will decrease ?Outcome: Not Progressing ?  ?

## 2021-07-20 NOTE — Plan of Care (Signed)
?  Problem: Clinical Measurements: ?Goal: Signs and symptoms of infection will decrease ?Outcome: Progressing ?  ?Problem: Health Behavior/Discharge Planning: ?Goal: Ability to manage health-related needs will improve ?Outcome: Progressing ?  ?

## 2021-07-20 NOTE — Progress Notes (Signed)
Pt requested update on RMSF and lyme disease associated tests. ID paged via [place].  ? ?Prompt provider callback. All tests still pending. Will replay information to patient.  ?

## 2021-07-21 ENCOUNTER — Other Ambulatory Visit (HOSPITAL_COMMUNITY): Payer: Self-pay

## 2021-07-21 LAB — EHRLICHIA ANTIBODY PANEL
E chaffeensis (HGE) Ab, IgG: NEGATIVE
E chaffeensis (HGE) Ab, IgM: NEGATIVE
E. Chaffeensis (HME) IgM Titer: NEGATIVE
E.Chaffeensis (HME) IgG: NEGATIVE

## 2021-07-21 LAB — CBC WITH DIFFERENTIAL/PLATELET
Abs Immature Granulocytes: 0.02 10*3/uL (ref 0.00–0.07)
Basophils Absolute: 0 10*3/uL (ref 0.0–0.1)
Basophils Relative: 1 %
Eosinophils Absolute: 0.3 10*3/uL (ref 0.0–0.5)
Eosinophils Relative: 5 %
HCT: 35.3 % — ABNORMAL LOW (ref 36.0–46.0)
Hemoglobin: 11.9 g/dL — ABNORMAL LOW (ref 12.0–15.0)
Immature Granulocytes: 0 %
Lymphocytes Relative: 29 %
Lymphs Abs: 1.9 10*3/uL (ref 0.7–4.0)
MCH: 29.8 pg (ref 26.0–34.0)
MCHC: 33.7 g/dL (ref 30.0–36.0)
MCV: 88.3 fL (ref 80.0–100.0)
Monocytes Absolute: 0.5 10*3/uL (ref 0.1–1.0)
Monocytes Relative: 7 %
Neutro Abs: 3.7 10*3/uL (ref 1.7–7.7)
Neutrophils Relative %: 58 %
Platelets: 207 10*3/uL (ref 150–400)
RBC: 4 MIL/uL (ref 3.87–5.11)
RDW: 13 % (ref 11.5–15.5)
WBC: 6.4 10*3/uL (ref 4.0–10.5)
nRBC: 0 % (ref 0.0–0.2)

## 2021-07-21 LAB — MAGNESIUM: Magnesium: 1.7 mg/dL (ref 1.7–2.4)

## 2021-07-21 LAB — COMPREHENSIVE METABOLIC PANEL
ALT: 29 U/L (ref 0–44)
AST: 28 U/L (ref 15–41)
Albumin: 2.9 g/dL — ABNORMAL LOW (ref 3.5–5.0)
Alkaline Phosphatase: 62 U/L (ref 38–126)
Anion gap: 7 (ref 5–15)
BUN: 8 mg/dL (ref 6–20)
CO2: 27 mmol/L (ref 22–32)
Calcium: 8.6 mg/dL — ABNORMAL LOW (ref 8.9–10.3)
Chloride: 108 mmol/L (ref 98–111)
Creatinine, Ser: 0.69 mg/dL (ref 0.44–1.00)
GFR, Estimated: >60 mL/min (ref >60)
Glucose, Bld: 96 mg/dL (ref 70–99)
Potassium: 3.8 mmol/L (ref 3.5–5.1)
Sodium: 142 mmol/L (ref 135–145)
Total Bilirubin: 0.6 mg/dL (ref 0.3–1.2)
Total Protein: 5.3 g/dL — ABNORMAL LOW (ref 6.5–8.1)

## 2021-07-21 MED ORDER — DOXYCYCLINE HYCLATE 100 MG PO TABS
100.0000 mg | ORAL_TABLET | Freq: Two times a day (BID) | ORAL | 0 refills | Status: AC
  Filled 2021-07-21: qty 7, 4d supply, fill #0

## 2021-07-21 MED ORDER — MAGNESIUM SULFATE 4 GM/100ML IV SOLN
4.0000 g | Freq: Once | INTRAVENOUS | Status: AC
  Administered 2021-07-21: 4 g via INTRAVENOUS
  Filled 2021-07-21: qty 100

## 2021-07-21 MED ORDER — ONDANSETRON 4 MG PO TBDP
4.0000 mg | ORAL_TABLET | Freq: Three times a day (TID) | ORAL | 0 refills | Status: DC | PRN
Start: 2021-07-21 — End: 2022-01-23
  Filled 2021-07-21: qty 20, 7d supply, fill #0

## 2021-07-21 MED ORDER — OXYCODONE HCL 5 MG PO TABS
5.0000 mg | ORAL_TABLET | ORAL | 0 refills | Status: DC | PRN
  Filled 2021-07-21: qty 10, 2d supply, fill #0

## 2021-07-21 NOTE — Progress Notes (Signed)
Mobility Specialist: Progress Note ? ? 07/21/21 1121  ?Mobility  ?Activity Ambulated independently in hallway  ?Level of Assistance Independent  ?Assistive Device None  ?Distance Ambulated (ft) 530 ft  ?Activity Response Tolerated well  ?$Mobility charge 1 Mobility  ? ?Received pt in bed having no complaints and agreeable to mobility. Asymptomatic throughout ambulation, returned back to bed w/ call bell in reach and all needs met. ? ?Abigail [name] ?Mobility Specialist ?Mobility Specialist [place]: [phone] ?Mobility Specialist [place]: [phone] ? ?

## 2021-07-21 NOTE — Discharge Summary (Signed)
Physician Discharge Summary  ?Abigail [name] XJO:[id number] DOB: 1966/01/17 DOA: 07/17/2021 ? ?PCP: [name], NP ? ?Admit date: 07/17/2021 ?Discharge date: 07/21/2021 ? ?Time spent: 55 minutes ? ?Recommendations for Outpatient Follow-up:  ?Follow-up with [name], NP on follow-up patient will need a basic metabolic profile, magnesium level done to follow-up on electrolytes and renal function.  Lyme titers, [place] spotted fever labs, ehrlichiosis labs will need to be followed up upon. ? ? ?Discharge Diagnoses:  ?Principal Problem: ?  Sepsis ([place]) ?Active Problems: ?  Tick bite of back ?  Hypothyroidism ?  Frequent headaches ?  Hypomagnesemia ?  Hypokalemia ? ? ?Discharge Condition: Stable and improved ? ?Diet recommendation: Regular ? ?Filed Weights  ? 07/18/21 1521 07/18/21 2319  ?Weight: 82.7 kg 84.6 kg  ? ? ?History of present illness:  ?HPI per Dr. Alcario Drought ? Abigail [name] is a 56 y.o. female with medical history significant of HTN, obesity, migraine headaches. ?  ?Pt presents to the ED with ~6 day history of N/V/D, fever, body aches, tick found on her back removed by PCP 4 days ago in office.  Also developed severe headache over past 24-48h. ?  ?No abd pain. ?  ?No cough, does have sinus symptoms (nasal congestion). ?  ?Started on doxycycline by PCP but only had 2 doses of this and thinks she vomited both up. ?  ?Tm 102.4 in ED. ?Hospital Course:  ? ?Assessment and Plan: ?* Sepsis ([place]) ?-Sepsis ruled out  ?pt with concerns for SIRS + unclear source of infection vs RMSF from tick bite?  Versus tickborne illness on presentation. ?Diarrhea not c/w RMSF and leukocytosis would be unusual. ?Lack of rash by this point (several days) into the disease is unusual for RMSF but up to 10% of patients may not have a rash so doesn't exclude. ?-Patient with no further diarrhea during the hospitalization. ?Bacterial (non-RMSF) meningitis possible but less likely given multiple days of illness,  lack of true meningismus on exam. ?Was on the sepsis pathway.  IV fluids saline locked.  ?Got cefepime, flagyl, vanc empirically in ED ?Patient subsequently placed on IV doxycycline during the hospitalization ?ID consulted patient seen in consultation by ID who have ordered ehrlichiosis labs which are currently pending, blood smear review. ?Was on oral doxycycline as an outpatient however due to emesis likely vomited up medications.  ?Given normal saline 1 L fluid bolus and aggressive fluid resuscitation on admission due to borderline blood pressure.  Patient with some complaints of swelling in her arms on 07/19/2021 and as such IV fluids saline lock.  Patient had good urine output with resolution of swelling in bilateral upper extremities.  ?-Blood pressure improved and remained stable.  ?Lactate 1.8 ?Negative studies so far: ?COVID and flu ?CXR ?UA (not impressive with just trace LE, no nitrites) ?CT abdomen and pelvis ?Urine cultures done with multiple species present. ?-Blood cultures obtained with no growth to date x3 days. ?Lyme antibodies (serum) pending at time of discharge ?RMSF AB (serum) pending at time of discharge ?Ehrlichiosis labs done at time of discharge ?EDP didn't feel LP indicated at this time. ?-Patient improved clinically and will be discharged on 3-1/2 more days of doxycycline. ?Outpatient follow-up with PCP. ? ?Tick bite of back ?Tick removed by PCP x4 days prior to admission.   ?-No surrounding erythema or rash.   ?-See problem #1 sepsis above.   ?-Patient seen in consultation by ID who are in agreement with IV doxycycline during the hospitalization. ?-  Patient improved clinically. ?-Patient be discharged home on 3-1/2 more days of oral doxycycline to complete a 7-day course of treatment per ID recommendations. ?-Outpatient follow-up with PCP. ? ?Hypothyroidism ?-Patient maintained on home regimen Synthroid.    ? ?Hypokalemia ?- Likely secondary to GI losses.   ?-Repleted during the  hospitalization.   ? ?Hypomagnesemia ?- Likely secondary to GI losses. ?-Repleted during the hospitalization.   ?-Outpatient follow-up.   ? ?Frequent headaches ?- Patient with complaints of headache and pain in her neck and shoulder blades. ?-Patient noted to have a history of frequent headaches however patient denies any daily headaches. ?-Patient stated current headache is different from any headaches she has had previously in the past, denies any feelings of a sinus headache or migrainous headache. ?-States no significant improvement with headaches on pain regimen of Vicodin and IV Toradol during the hospitalization. ?-Head CT done negative for any acute abnormalities. ?-Patient maintained on Zofran, pain regimen changed to oxycodone 5 mg every 4 hours as needed with clinical improvement. ?-Patient will be discharged on oxycodone 5 mg every 4 hours as needed (number 10 tablets ordered.) ?-Patient's home regimen of Vicodin discontinued on discharge. ?-Outpatient follow-up with PCP. ? ? ? ? ?  ? ?Procedures: ?CT abdomen and pelvis 07/18/2021 ?Chest x-ray 07/18/2021 ?CT head 07/20/2021 ? ?Consultations: ?ID: Dr. Gale Journey 07/18/2021 ?  ? ?Discharge Exam: ?Vitals:  ? 07/21/21 0144 07/21/21 0950  ?BP: 110/61 122/68  ?Pulse: (!) 56 69  ?Resp: 14 17  ?Temp: 98.3 ?F (36.8 ?C) 98.2 ?F (36.8 ?C)  ?SpO2: 95% 95%  ? ? ?General: NAD ?Cardiovascular: Regular rate rhythm no murmurs rubs or gallops.  No JVD.  No lower extremity edema. ?Respiratory: Clear to auscultation bilaterally.  No wheezes, no crackles, no rhonchi. ? ?Discharge Instructions ? ? ?Discharge Instructions   ? ? Diet general   Complete by: As directed ?  ? Increase activity slowly   Complete by: As directed ?  ? ?  ? ?Allergies as of 07/21/2021   ? ?   Reactions  ? Latex Rash  ? Penicillins Nausea Only  ? Has patient had a PCN reaction causing immediate rash, facial/tongue/throat swelling, SOB or lightheadedness with hypotension: No ?Has patient had a PCN reaction causing  severe rash involving mucus membranes or skin necrosis: No ?Has patient had a PCN reaction that required hospitalization: No ?Has patient had a PCN reaction occurring within the last 10 years: No ?If all of the above answers are "NO", then may proceed with Cephalosporin use.  ? ?  ? ?  ?Medication List  ?  ? ?STOP taking these medications   ? ?HYDROcodone-acetaminophen 5-325 MG tablet ?Commonly known as: NORCO/VICODIN ?  ?traMADol 50 MG tablet ?Commonly known as: ULTRAM ?  ? ?  ? ?TAKE these medications   ? ?Accu-Chek [employer] Kit ?Use as directed to test blood sugar daily ?  ?Accu-Chek FastClix Lancets Misc ?USE 1 LANCET TO CHECK BLOOD SUGAR THREE TIMES DAILY ?  ?Accu-Chek Guide test strip ?Generic drug: glucose blood ?USE ONE STRIP THREE TIMES DAILY ?  ?Accu-Chek Guide w/Device Kit ?by Does not apply route. ?  ?Advil 200 MG tablet ?Generic drug: ibuprofen ?Take 200-600 mg by mouth every 6 (six) hours as needed for mild pain or headache. ?  ?albuterol 108 (90 Base) MCG/ACT inhaler ?Commonly known as: ProAir HFA ?Inhale 2 puffs into the lungs every 6 (six) hours as needed for wheezing or shortness of breath. ?  ?[place]  PO ?Take 1 tablet by mouth daily as needed (body pain). ?  ?docusate sodium 100 MG capsule ?Commonly known as: Colace ?Take 1 capsule (100 mg total) by mouth daily as needed for up to 30 doses. ?  ?doxycycline 100 MG tablet ?Commonly known as: VIBRA-TABS ?Take 1 tablet (100 mg total) by mouth 2 (two) times daily for 7 doses. ?What changed:  ?when to take this ?additional instructions ?  ?esomeprazole 40 MG capsule ?Commonly known as: [place] ?TAKE 1 CAPSULE BY MOUTH DAILY ?What changed:  ?when to take this ?reasons to take this ?  ?FLUoxetine 40 MG capsule ?Commonly known as: PROZAC ?TAKE 1 CAPSULE BY MOUTH ONCE DAILY FOR ANXIETY ?What changed: See the new instructions. ?  ?hydrOXYzine 10 MG tablet ?Commonly known as: ATARAX ?Take 1 to 2 tablets by mouth twice daily as needed for  anxiety. ?What changed:  ?how much to take ?how to take this ?when to take this ?reasons to take this ?additional instructions ?  ?levothyroxine 75 MCG tablet ?Commonly known as: SYNTHROID ?Take 1 tablet by mouth ever

## 2021-07-21 NOTE — Progress Notes (Signed)
?  Transition of Care (TOC) Screening Note ? ? ?Patient Details  ?Name: Abigail [name] ?Date of Birth: [birth year] ? ? ?Transition of Care (TOC) CM/SW Contact:    ?Tom-Johnson, Renea Ee, RN ?Phone Number: ?07/21/2021, 3:01 PM ? ?Patient is scheduled for discharge today Admitted for Sepsis. A tick was found on patient's back in the ED. Discharging home on 3 1/2 more days of oral Doxycycline to complete a 7-day course of treatment per ID recommendations. Family to transport at discharge.  ? ?Transition of Care Department [facility]) has reviewed patient and no TOC needs or recommendations have been identified at this time. No further TOC needs noted. ?  ?

## 2021-07-22 ENCOUNTER — Telehealth: Payer: Self-pay

## 2021-07-22 LAB — ROCKY MTN SPOTTED FVR ABS PNL(IGG+IGM)
RMSF IgG: NEGATIVE
RMSF IgM: 0.26 index (ref 0.00–0.89)

## 2021-07-22 NOTE — Telephone Encounter (Addendum)
Transition Care Management Unsuccessful Follow-up Telephone Call ? ?Date of discharge and from where:  [place] 07-21-21 Dx: sepsis ? ?Attempts:  1st Attempt ? ?Reason for unsuccessful TCM follow-up call:  Left voice message ? ?Transition Care Management Follow-up Telephone Call ?Date of discharge and from where: [place] 07-21-21 Dx: sepsis ?How have you been since you were released from the hospital? Doing ok  ?Any questions or concerns? No ? ?Items Reviewed: ?Did the pt receive and understand the discharge instructions provided? Yes  ?Medications obtained and verified? Yes  ?Other? No  ?Any new allergies since your discharge? No  ?Dietary orders reviewed? Yes ?Do you have support at home? Yes  ? ?Home Care and Equipment/Supplies: ?Were home health services ordered? no ?If so, what is the name of the agency? na  ?Has the agency set up a time to come to the patient's home? not applicable ?Were any new equipment or medical supplies ordered?  No ?What is the name of the medical supply agency? na ?Were you able to get the supplies/equipment? not applicable ?Do you have any questions related to the use of the equipment or supplies? No ? ?Functional Questionnaire: (I = Independent and D = Dependent) ?ADLs: I ? ?Bathing/Dressing- I ? ?Meal Prep- I ? ?Eating- I ? ?Maintaining continence- I ? ?Transferring/Ambulation- I ? ?Managing Meds- I ? ?Follow up appointments reviewed: ? ?PCP Hospital f/u appt confirmed? Yes  Scheduled to see Dr Carlis Abbott on 07-23-21 @ 720am. ?[facility] f/u appt confirmed? Yes  Scheduled to see Dr Ellyn Hack on 10-06-21 @ 420pm. ?Are transportation arrangements needed? No  ?If their condition worsens, is the pt aware to call PCP or go to the Emergency Dept.? Yes ?Was the patient provided with contact information for the PCP's office or ED? Yes ?Was to pt encouraged to call back with questions or concerns? Yes  ? ?  ?

## 2021-07-23 ENCOUNTER — Ambulatory Visit: Payer: 59 | Admitting: Primary Care

## 2021-07-23 ENCOUNTER — Encounter: Payer: Self-pay | Admitting: Primary Care

## 2021-07-23 VITALS — BP 122/64 | HR 68 | Temp 98.6°F | Ht 62.0 in | Wt 180.0 lb

## 2021-07-23 DIAGNOSIS — S30860D Insect bite (nonvenomous) of lower back and pelvis, subsequent encounter: Secondary | ICD-10-CM

## 2021-07-23 DIAGNOSIS — W57XXXD Bitten or stung by nonvenomous insect and other nonvenomous arthropods, subsequent encounter: Secondary | ICD-10-CM

## 2021-07-23 DIAGNOSIS — A419 Sepsis, unspecified organism: Secondary | ICD-10-CM

## 2021-07-23 DIAGNOSIS — W57XXXS Bitten or stung by nonvenomous insect and other nonvenomous arthropods, sequela: Secondary | ICD-10-CM

## 2021-07-23 DIAGNOSIS — R110 Nausea: Secondary | ICD-10-CM | POA: Diagnosis not present

## 2021-07-23 DIAGNOSIS — S30860S Insect bite (nonvenomous) of lower back and pelvis, sequela: Secondary | ICD-10-CM

## 2021-07-23 DIAGNOSIS — R519 Headache, unspecified: Secondary | ICD-10-CM | POA: Diagnosis not present

## 2021-07-23 LAB — CULTURE, BLOOD (ROUTINE X 2)
Culture: NO GROWTH
Culture: NO GROWTH
Special Requests: ADEQUATE

## 2021-07-23 LAB — MAGNESIUM: Magnesium: 1.8 mg/dL (ref 1.5–2.5)

## 2021-07-23 LAB — BASIC METABOLIC PANEL
BUN: 8 mg/dL (ref 6–23)
CO2: 31 mEq/L (ref 19–32)
Calcium: 9.5 mg/dL (ref 8.4–10.5)
Chloride: 104 mEq/L (ref 96–112)
Creatinine, Ser: 0.68 mg/dL (ref 0.40–1.20)
GFR: 97.28 mL/min (ref >60.00)
Glucose, Bld: 88 mg/dL (ref 70–99)
Potassium: 4.1 mEq/L (ref 3.5–5.1)
Sodium: 141 mEq/L (ref 135–145)

## 2021-07-23 LAB — LYME DISEASE DNA BY PCR(BORRELIA BURG): Lyme Disease(B.burgdorferi)PCR: NEGATIVE

## 2021-07-23 NOTE — Assessment & Plan Note (Addendum)
Recent hospitalization for tick borne illness with sepsis.  ?Hospital notes, labs, imaging reviewed. ? ?Repeat magnesium, BMP due and pending today.  ?Lyme panel pending, other tick borne illness labs negative. ?Testing for Alpha Gal as this was not tested during her hospital stay.  ? ?Exam today overall stable.  ?Work note provided today for missed work.  ? ?Return precautions provided.  ?

## 2021-07-23 NOTE — Progress Notes (Signed)
? ?Subjective:  ? ? Patient ID: Abigail [name], female    DOB: [birth year], 56 y.o.   MRN: [id number] ? ?HPI ? ?Abigail [name] is a very pleasant 56 y.o. female with a history of PVCs, hyperlipidemia, controlled type 2 diabetes, hypothyroidism, tick bite, overactive bladder, fatigue, sepsis who presents today for TCM hospital follow-up. ? ?She was initially evaluated on 07/14/2021 by Dr. Lorelei Pont for a tick bite that she found the day prior, unknown time of attachment.  Symptoms included nausea, feeling clammy, body aches.  There was question of potential tick remains so all darkened material from the site of attachment was removed.  She was treated with doxycycline 100 mg twice daily x10 days and Zofran. ? ?She presented to [facility] ED on 07/18/2021 for severe nausea, vomiting, diarrhea, fever, body aches.  Presentation and work-up was consistent with sepsis, likely tickborne illness as she has been unable to consistently take doxycycline due to nausea.  She was admitted for further evaluation and treatment. ? ?During her hospital stay she was treated with IV doxycycline, IV fluids, IV vancomycin/cefepime/Flagyl empirically.  Infectious disease consulted who recommended additional tickborne illness testing. Magnesium and potassium were low so this was replenished.  She developed frequent headaches, pain to her neck and shoulders so she underwent CT head which was negative. ? ?She was discharged home on 07/21/2021 with a prescription for oxycodone 5 mg every 4 hours as needed for headaches, a few more days of doxycycline 100 mg BID.  She was advised to follow-up with PCP for repeat BMP, magnesium level, follow-up on tickborne illness titers. ? ?Since her discharge home she's feeling better, still feeling fatigued. Headaches are present but improved. Appetite is still reduced. She denies fevers since her hospital stay. Nausea has improved but is still present. She attributes this to doxycycline. She has not been back  to work, plans on returning today. She has been compliant to doxycycline. She is working on Financial planner with water.  ? ?Review of Systems  ?Constitutional:  Positive for fatigue. Negative for fever.  ?Respiratory:  Negative for shortness of breath.   ?Cardiovascular:  Negative for chest pain.  ?Musculoskeletal:  Negative for myalgias.  ?Neurological:  Positive for headaches.  ? ?   ? ? ?Past Medical History:  ?Diagnosis Date  ? Anxiety   ? Bulging lumbar disc   ? CAP (community acquired pneumonia)   ? Cardiac arrhythmia due to congenital heart disease   ? no arrhythmia identified   ? Celiac disease   ? Depression   ? Diverticulosis   ? Sigmoid colon  ? Fatty liver 05/2016  ? Frequent headaches   ? GERD (gastroesophageal reflux disease)   ? Heart disease   ? History of kidney stones   ? Hypertension   ? Kidney stones   ? Migraine   ? Obesity (BMI 35.0-39.9 without comorbidity)   ? UTI (urinary tract infection)   ? ? ?Social History  ? ?Socioeconomic History  ? Marital status: Single  ?  Spouse name: Not on file  ? Number of children: Not on file  ? Years of education: Not on file  ? Highest education level: Not on file  ?Occupational History  ? Not on file  ?Tobacco Use  ? Smoking status: Former  ?  Types: Cigarettes  ?  Quit date: 2008  ?  Years since quitting: 15.3  ? Smokeless tobacco: Never  ? Tobacco comments:  ?  used tobacco "socially"  ?Vaping Use  ?  Vaping Use: Never used  ?Substance and Sexual Activity  ? Alcohol use: No  ?  Alcohol/week: 0.0 standard drinks  ? Drug use: No  ? Sexual activity: Not on file  ?Other Topics Concern  ? Not on file  ?Social History Narrative  ? Single.  ? Has a set of Twins, age 38.  ? Works for the [employer] and [employer] and [employer]  ? Enjoys relaxing, spending time with her children.  ? ?Social Determinants of Health  ? ?Financial Resource Strain: Not on file  ?Food Insecurity: Not on file  ?Transportation Needs: Not on file  ?Physical Activity: Not on file  ?Stress:  Not on file  ?Social Connections: Not on file  ?Intimate Partner Violence: Not on file  ? ? ?Past Surgical History:  ?Procedure Laterality Date  ? ABDOMINAL EXPLORATION SURGERY  1986  ? COLONOSCOPY  06/2016  ? CYSTOSCOPY/URETEROSCOPY/HOLMIUM LASER/STENT PLACEMENT Right 05/16/2021  ? Procedure: CYSTOSCOPY/URETEROSCOPY/HOLMIUM LASER/STENT PLACEMENT;  Surgeon: Janith Lima, MD;  Location: [facility];  Service: Urology;  Laterality: Right;  ? ESOPHAGEAL MANOMETRY N/A 12/06/2019  ? Procedure: ESOPHAGEAL MANOMETRY (EM);  Surgeon: Mauri Pole, MD;  Location: [facility] END[facility]COPY;  Service: Endoscopy;  Laterality: N/A;  ? EXTRACORPOREAL SHOCK WAVE LITHOTRIPSY Left 12/03/2016  ? Procedure: LEFT EXTRACORPOREAL SHOCK WAVE LITHOTRIPSY (ESWL);  Surgeon: Irine Seal, MD;  Location: [facility];  Service: Urology;  Laterality: Left;  ? KIDNEY STONE SURGERY    ? LEFT HEART CATH AND CORONARY ANGIOGRAPHY  07/28/2016  ?  Large tortuous coronary arteries no radiographic evidence of disease.  Not anginal chest pain.  Normal EF.  ? LITHOTRIPSY    ? NASAL SINUS SURGERY    ? ROTATOR CUFF REPAIR Right 12/27/2017  ? TRANSTHORACIC ECHOCARDIOGRAM  06/2017  ? EF 60-65%.  GR 1-2 DD.  Otherwise normal echo.  Normal valve function.  Normal wall motion.  ? TUBAL LIGATION    ? ? ?Family History  ?Problem Relation Age of Onset  ? Gallstones Mother   ? COPD Father   ? Emphysema Father   ? Colon cancer Neg Hx   ? Stomach cancer Neg Hx   ? Esophageal cancer Neg Hx   ? Pancreatic cancer Neg Hx   ? Liver disease Neg Hx   ? ? ?Allergies  ?Allergen Reactions  ? Latex Rash  ? Penicillins Nausea Only  ?  Has patient had a PCN reaction causing immediate rash, facial/tongue/throat swelling, SOB or lightheadedness with hypotension: No ?Has patient had a PCN reaction causing severe rash involving mucus membranes or skin necrosis: No ?Has patient had a PCN reaction that required hospitalization: No ?Has patient had a PCN reaction occurring within the last 10 years: No ?If  all of the above answers are "NO", then may proceed with Cephalosporin use. ?  ? ? ?Current Outpatient Medications on File Prior to Visit  ?Medication Sig Dispense Refill  ? Accu-Chek FastClix Lancets MISC USE 1 LANCET TO CHECK BLOOD SUGAR THREE TIMES DAILY 300 each 1  ? ACCU-CHEK GUIDE test strip USE ONE STRIP THREE TIMES DAILY    ? ADVIL 200 MG tablet Take 200-600 mg by mouth every 6 (six) hours as needed for mild pain or headache.    ? albuterol (PROAIR HFA) 108 (90 Base) MCG/ACT inhaler Inhale 2 puffs into the lungs every 6 (six) hours as needed for wheezing or shortness of breath. 8 g 1  ? Aspirin-Caffeine (BAYER BACK & BODY PO) Take 1 tablet by mouth daily  as needed (body pain).    ? Blood Glucose Monitoring Suppl (ACCU-CHEK GUIDE) w/Device KIT by Does not apply route.    ? Cholecalciferol (VITAMIN D3) 1.25 MG (50000 UT) CAPS Take 1 capsule by mouth once a week (Patient taking differently: Take 50,000 Units by mouth every Sunday.) 12 capsule 1  ? docusate sodium (COLACE) 100 MG capsule Take 1 capsule (100 mg total) by mouth daily as needed for up to 30 doses. 30 capsule 0  ? doxycycline (VIBRA-TABS) 100 MG tablet Take 1 tablet (100 mg total) by mouth 2 (two) times daily for 7 doses. 7 tablet 0  ? esomeprazole (NEXIUM) 40 MG capsule TAKE 1 CAPSULE BY MOUTH DAILY (Patient taking differently: Take 40 mg by mouth daily as needed (heartburn).) 90 capsule 1  ? FLUoxetine (PROZAC) 40 MG capsule TAKE 1 CAPSULE BY MOUTH ONCE DAILY FOR ANXIETY (Patient taking differently: Take 40 mg by mouth daily.) 90 capsule 2  ? HYDROcodone-acetaminophen (NORCO/VICODIN) 5-325 MG tablet Take 1 tablet by mouth every 6 (six) hours as needed.    ? hydrOXYzine (ATARAX/VISTARIL) 10 MG tablet Take 1 to 2 tablets by mouth twice daily as needed for anxiety. (Patient taking differently: Take 10-20 mg by mouth 2 (two) times daily as needed for anxiety.) 30 tablet 0  ? Lancets Misc. (ACCU-CHEK FASTCLIX LANCET) KIT Use as directed to test blood  sugar daily 1 kit 0  ? levothyroxine (SYNTHROID) 75 MCG tablet Take 1 tablet by mouth every morning on an empty stomach with water only.  No food or other medications for 30 minutes. (Patient taking diff

## 2021-07-23 NOTE — Patient Instructions (Signed)
Stop by the lab prior to leaving today. I will notify you of your results once received.  ? ?Continue doxycycline 100 mg twice daily until finished. ? ?Increase your intake of food slowly.  ?Continue to work on hydration.  ? ?It was a pleasure to see you today! ? ?

## 2021-07-23 NOTE — Assessment & Plan Note (Signed)
Recent hospitalization for tick borne illness with sepsis.  ?Hospital notes, labs, imaging reviewed. ? ?Repeat magnesium, BMP due and pending today.  ?Lyme panel pending, other tick borne illness labs negative. ? ?Exam today overall stable.  ?Work note provided today for missed work.  ? ?Return precautions provided.  ?

## 2021-07-28 LAB — ALPHA-GAL PANEL
Allergen, Mutton, f88: <0.1 kU/L
Allergen, Pork, f26: <0.1 kU/L
Beef: <0.1 kU/L
CLASS: 0
CLASS: 0
Class: 0
GALACTOSE-ALPHA-1,3-GALACTOSE IGE*: <0.1 kU/L (ref <0.10)

## 2021-07-28 LAB — INTERPRETATION:

## 2021-08-08 ENCOUNTER — Other Ambulatory Visit: Payer: Self-pay | Admitting: Primary Care

## 2021-08-08 ENCOUNTER — Other Ambulatory Visit: Payer: Self-pay | Admitting: Cardiology

## 2021-08-08 DIAGNOSIS — E1165 Type 2 diabetes mellitus with hyperglycemia: Secondary | ICD-10-CM

## 2021-08-25 ENCOUNTER — Encounter: Payer: Self-pay | Admitting: Gastroenterology

## 2021-09-10 ENCOUNTER — Other Ambulatory Visit: Payer: Self-pay | Admitting: Cardiology

## 2021-09-17 ENCOUNTER — Ambulatory Visit (INDEPENDENT_AMBULATORY_CARE_PROVIDER_SITE_OTHER): Payer: 59 | Admitting: Family

## 2021-09-17 ENCOUNTER — Encounter: Payer: Self-pay | Admitting: Family

## 2021-09-17 VITALS — BP 120/62 | HR 63 | Temp 98.6°F | Resp 16 | Ht 62.0 in | Wt 171.0 lb

## 2021-09-17 DIAGNOSIS — J069 Acute upper respiratory infection, unspecified: Secondary | ICD-10-CM

## 2021-09-17 DIAGNOSIS — R051 Acute cough: Secondary | ICD-10-CM

## 2021-09-17 DIAGNOSIS — J029 Acute pharyngitis, unspecified: Secondary | ICD-10-CM

## 2021-09-17 DIAGNOSIS — Z20822 Contact with and (suspected) exposure to covid-19: Secondary | ICD-10-CM

## 2021-09-17 LAB — POCT RAPID STREP A (OFFICE): Rapid Strep A Screen: NEGATIVE

## 2021-09-17 LAB — POC COVID19 BINAXNOW: SARS Coronavirus 2 Ag: NEGATIVE

## 2021-09-17 MED ORDER — BENZONATATE 200 MG PO CAPS: 200.0000 mg | ORAL_CAPSULE | Freq: Two times a day (BID) | ORAL | 0 refills | Status: DC | PRN

## 2021-09-17 NOTE — Assessment & Plan Note (Signed)
rx tessalon perrles 200 mg tid prn cough  Recommend mucinex without DM

## 2021-09-17 NOTE — Assessment & Plan Note (Signed)
Strep tested in office, negative Warm salt water gargles

## 2021-09-17 NOTE — Assessment & Plan Note (Signed)
covid test in office, negative.

## 2021-09-17 NOTE — Progress Notes (Signed)
Established Patient Office Visit  Subjective:  Patient ID: Abigail [name], female    DOB: 02-Aug-1965  Age: 56 y.o. MRN: [id number]  CC:  Chief Complaint  Patient presents with   Generalized Body Aches   Headache    X 3 days took a covid test negative   Sore Throat    HPI Abigail [name] is here today with concerns.   Four days ago started with sore throat headache, cough productive, and body aches. With chills at times. No known fever.   Some sinus pressure, around the nose. Had some leftover doxycycline which she started taking, she has taken two already. Also taking advil allergy with only mild relief.   Past Medical History:  Diagnosis Date   Anxiety    Bulging lumbar disc    CAP (community acquired pneumonia)    Cardiac arrhythmia due to congenital heart disease    no arrhythmia identified    Celiac disease    Depression    Diverticulosis    Sigmoid colon   Fatty liver 05/2016   Frequent headaches    GERD (gastroesophageal reflux disease)    Heart disease    History of kidney stones    Hypertension    Kidney stones    Migraine    Obesity (BMI 35.0-39.9 without comorbidity)    UTI (urinary tract infection)     Past Surgical History:  Procedure Laterality Date   ABDOMINAL EXPLORATION SURGERY  1986   COLONOSCOPY  06/2016   CYSTOSCOPY/URETEROSCOPY/HOLMIUM LASER/STENT PLACEMENT Right 05/16/2021   Procedure: CYSTOSCOPY/URETEROSCOPY/HOLMIUM LASER/STENT PLACEMENT;  Surgeon: Janith Lima, MD;  Location: [facility];  Service: Urology;  Laterality: Right;   ESOPHAGEAL MANOMETRY N/A 12/06/2019   Procedure: ESOPHAGEAL MANOMETRY (EM);  Surgeon: Mauri Pole, MD;  Location: [facility];  Service: Endoscopy;  Laterality: N/A;   EXTRACORPOREAL SHOCK WAVE LITHOTRIPSY Left 12/03/2016   Procedure: LEFT EXTRACORPOREAL SHOCK WAVE LITHOTRIPSY (ESWL);  Surgeon: Irine Seal, MD;  Location: [facility];  Service: Urology;  Laterality: Left;   KIDNEY STONE SURGERY     LEFT  HEART CATH AND CORONARY ANGIOGRAPHY  07/28/2016    Large tortuous coronary arteries no radiographic evidence of disease.  Not anginal chest pain.  Normal EF.   LITHOTRIPSY     NASAL SINUS SURGERY     ROTATOR CUFF REPAIR Right 12/27/2017   TRANSTHORACIC ECHOCARDIOGRAM  06/2017   EF 60-65%.  GR 1-2 DD.  Otherwise normal echo.  Normal valve function.  Normal wall motion.   TUBAL LIGATION      Family History  Problem Relation Age of Onset   Gallstones Mother    COPD Father    Emphysema Father    Colon cancer Neg Hx    Stomach cancer Neg Hx    Esophageal cancer Neg Hx    Pancreatic cancer Neg Hx    Liver disease Neg Hx     Social History   Socioeconomic History   Marital status: Single    Spouse name: Not on file   Number of children: Not on file   Years of education: Not on file   Highest education level: Not on file  Occupational History   Not on file  Tobacco Use   Smoking status: Former    Types: Cigarettes    Quit date: 2008    Years since quitting: 15.5   Smokeless tobacco: Never   Tobacco comments:    used tobacco "socially"  Vaping Use   Vaping Use: Never used  Substance and Sexual Activity   Alcohol use: No    Alcohol/week: 0.0 standard drinks of alcohol   Drug use: No   Sexual activity: Not on file  Other Topics Concern   Not on file  Social History Narrative   Single.   Has a set of Twins, age 17.   Works for the [employer] and [employer] and [employer]   Enjoys relaxing, spending time with her children.   Social Determinants of Health   Financial Resource Strain: Not on file  Food Insecurity: Not on file  Transportation Needs: Not on file  Physical Activity: Not on file  Stress: Not on file  Social Connections: Not on file  Intimate Partner Violence: Not on file    Outpatient Medications Prior to Visit  Medication Sig Dispense Refill   Accu-Chek FastClix Lancets MISC USE 1 LANCET TO CHECK BLOOD SUGAR THREE TIMES DAILY 300 each 1   ADVIL  200 MG tablet Take 200-600 mg by mouth every 6 (six) hours as needed for mild pain or headache.     albuterol (PROAIR HFA) 108 (90 Base) MCG/ACT inhaler Inhale 2 puffs into the lungs every 6 (six) hours as needed for wheezing or shortness of breath. 8 g 1   Aspirin-Caffeine (BAYER BACK & BODY PO) Take 1 tablet by mouth daily as needed (body pain).     Blood Glucose Monitoring Suppl (ACCU-CHEK GUIDE) w/Device KIT by Does not apply route.     Cholecalciferol (VITAMIN D3) 1.25 MG (50000 UT) CAPS Take 1 capsule by mouth once a week (Patient taking differently: Take 50,000 Units by mouth every Sunday.) 12 capsule 1   docusate sodium (COLACE) 100 MG capsule Take 1 capsule (100 mg total) by mouth daily as needed for up to 30 doses. 30 capsule 0   esomeprazole (NEXIUM) 40 MG capsule TAKE 1 CAPSULE BY MOUTH DAILY (Patient taking differently: Take 40 mg by mouth daily as needed (heartburn).) 90 capsule 1   FLUoxetine (PROZAC) 40 MG capsule TAKE 1 CAPSULE BY MOUTH ONCE DAILY FOR ANXIETY (Patient taking differently: Take 40 mg by mouth daily.) 90 capsule 2   glucose blood (ACCU-CHEK GUIDE) test strip USE ONE STRIP THREE TIMES DAILY 300 each 1   HYDROcodone-acetaminophen (NORCO/VICODIN) 5-325 MG tablet Take 1 tablet by mouth every 6 (six) hours as needed.     hydrOXYzine (ATARAX/VISTARIL) 10 MG tablet Take 1 to 2 tablets by mouth twice daily as needed for anxiety. (Patient taking differently: Take 10-20 mg by mouth 2 (two) times daily as needed for anxiety.) 30 tablet 0   Lancets Misc. (ACCU-CHEK FASTCLIX LANCET) KIT Use as directed to test blood sugar daily 1 kit 0   levothyroxine (SYNTHROID) 75 MCG tablet Take 1 tablet by mouth every morning on an empty stomach with water only.  No food or other medications for 30 minutes. (Patient taking differently: Take 75 mcg by mouth See admin instructions. Take 75 mcg by mouth every morning on an empty stomach with water only, with no food or other medications for 30 minutes)  90 tablet 0   linaclotide (LINZESS) 145 MCG CAPS capsule TAKE 1 CAPSULE BY MOUTH ONCE DAILY BEFORE BREAKFAST (Patient taking differently: Take 145 mcg by mouth at bedtime.) 90 capsule 3   metoprolol tartrate (LOPRESSOR) 25 MG tablet TAKE 1 TABLET BY MOUTH TWICE DAILY . APPOINTMENT REQUIRED FOR FUTURE REFILLS WITH  DR  HARDING  FOR  FUTURE  REFILLS 30 tablet 0   ondansetron (ZOFRAN-ODT) 4 MG  disintegrating tablet Take 1 tablet (4 mg total) by mouth every 8 (eight) hours as needed for nausea or vomiting. 20 tablet 0   oxybutynin (DITROPAN-XL) 5 MG 24 hr tablet Take 5 mg by mouth daily.  1   oxyCODONE (OXY IR/ROXICODONE) 5 MG immediate release tablet Take 1 tablet (5 mg total) by mouth every 4 (four) hours as needed for moderate pain. 10 tablet 0   rosuvastatin (CRESTOR) 20 MG tablet Take 1 tablet by mouth once daily 15 tablet 0   No facility-administered medications prior to visit.    Allergies  Allergen Reactions   Latex Rash   Penicillins Nausea Only    Has patient had a PCN reaction causing immediate rash, facial/tongue/throat swelling, SOB or lightheadedness with hypotension: No Has patient had a PCN reaction causing severe rash involving mucus membranes or skin necrosis: No Has patient had a PCN reaction that required hospitalization: No Has patient had a PCN reaction occurring within the last 10 years: No If all of the above answers are "NO", then may proceed with Cephalosporin use.         Objective:    Physical Exam Constitutional:      General: She is not in acute distress.    Appearance: Normal appearance. She is well-developed. She is obese. She is not ill-appearing, toxic-appearing or diaphoretic.  HENT:     Right Ear: Tympanic membrane normal.     Left Ear: Tympanic membrane normal.     Nose: No congestion or rhinorrhea.     Right Turbinates: Not enlarged or swollen.     Left Turbinates: Not enlarged or swollen.     Right Sinus: Frontal sinus tenderness present. No  maxillary sinus tenderness.     Left Sinus: Frontal sinus tenderness present. No maxillary sinus tenderness.     Comments: Mild sinus tenderness    Mouth/Throat:     Mouth: Mucous membranes are moist.     Pharynx: No pharyngeal swelling, oropharyngeal exudate or posterior oropharyngeal erythema.     Tonsils: No tonsillar exudate.  Eyes:     Extraocular Movements: Extraocular movements intact.     Conjunctiva/sclera: Conjunctivae normal.     Pupils: Pupils are equal, round, and reactive to light.  Neck:     Thyroid: No thyroid mass.  Cardiovascular:     Rate and Rhythm: Normal rate and regular rhythm.  Pulmonary:     Effort: Pulmonary effort is normal.     Breath sounds: Normal breath sounds.  Lymphadenopathy:     Cervical:     Right cervical: No superficial cervical adenopathy.    Left cervical: No superficial cervical adenopathy.  Neurological:     Mental Status: She is alert.     BP 120/62   Pulse 63   Temp 98.6 F (37 C)   Resp 16   Ht _0  (1.575 m)   Wt 171 lb (77.6 kg)   LMP 08/12/2015 Comment: patient states hx. tubal ligation  SpO2 98%   BMI 31.28 kg/m  Wt Readings from Last 3 Encounters:  09/17/21 171 lb (77.6 kg)  07/23/21 180 lb (81.6 kg)  07/18/21 186 lb 8.2 oz (84.6 kg)     Health Maintenance Due  Topic Date Due   FOOT EXAM  Never done   OPHTHALMOLOGY EXAM  Never done   Hepatitis C Screening  Never done   Zoster Vaccines- Shingrix (1 of 2) Never done   MAMMOGRAM  09/20/2020   URINE MICROALBUMIN  05/29/2021  There are no preventive care reminders to display for this patient.  Lab Results  Component Value Date   TSH 1.74 05/23/2021   Lab Results  Component Value Date   WBC 6.4 07/21/2021   HGB 11.9 (L) 07/21/2021   HCT 35.3 (L) 07/21/2021   MCV 88.3 07/21/2021   PLT 207 07/21/2021   Lab Results  Component Value Date   NA 141 07/23/2021   K 4.1 07/23/2021   CO2 31 07/23/2021   GLUCOSE 88 07/23/2021   BUN 8 07/23/2021    CREATININE 0.68 07/23/2021   BILITOT 0.6 07/21/2021   ALKPHOS 62 07/21/2021   AST 28 07/21/2021   ALT 29 07/21/2021   PROT 5.3 (L) 07/21/2021   ALBUMIN 2.9 (L) 07/21/2021   CALCIUM 9.5 07/23/2021   ANIONGAP 7 07/21/2021   GFR 97.28 07/23/2021   Lab Results  Component Value Date   HGBA1C 5.8 05/23/2021      Assessment & Plan:   Problem List Items Addressed This Visit       Respiratory   Viral upper respiratory infection    For now we will use supportive measures.  Advised patient on supportive measures:  Be sure to rest, drink plenty of fluids, and use tylenol or ibuprofen as needed for pain. Follow up if fever >101, if symptoms worsen or if symptoms are not improved in 3 days. Patient verbalizes understanding.           Other   Suspected COVID-19 virus infection    covid test in office, negative.       Relevant Orders   POC COVID-19 BinaxNow (Completed)   Sore throat - Primary    Strep tested in office, negative Warm salt water gargles        Relevant Orders   POCT rapid strep A (Completed)   Acute cough    rx tessalon perrles 200 mg tid prn cough  Recommend mucinex without DM       Relevant Medications   benzonatate (TESSALON) 200 MG capsule    Meds ordered this encounter  Medications   benzonatate (TESSALON) 200 MG capsule    Sig: Take 1 capsule (200 mg total) by mouth 2 (two) times daily as needed for cough.    Dispense:  20 capsule    Refill:  0    Order Specific Question:   Supervising Provider    Answer:   BEDSOLE, AMY E [[id number]    Follow-up: No follow-ups on file.    Eugenia Pancoast, FNP

## 2021-09-17 NOTE — Patient Instructions (Signed)
Be sure to rest, drink plenty of fluids, and use tylenol or ibuprofen as needed for pain. Follow up if fever >101, if symptoms worsen or if symptoms are not improved in 3 days.   You can try a few things over the counter to help with your symptoms including:  Cough: Delsym or Robitussin (get the off brand, works just as well) Chest Congestion: Mucinex (plain, not DM) Nasal Congestion/Ear Pressure/Sinus Pressure: Try using Flonase (fluticasone) nasal spray. This can be purchased over the counter. Body aches, fevers, headache: Ibuprofen (not to exceed 2400 mg in 24 hours) or Acetaminophen-Tylenol (not to exceed 3000 mg in 24 hours) Runny Nose/Throat Drainage/Sneezing/Itchy or Watery Eyes: An antihistamine such as Zyrtec, Claritin, Xyzal, Allegra  You should be feeling better by day seven of symptoms, but please do contact me if this is not the case.

## 2021-09-17 NOTE — Assessment & Plan Note (Signed)
For now we will use supportive measures.  Advised patient on supportive measures:  Be sure to rest, drink plenty of fluids, and use tylenol or ibuprofen as needed for pain. Follow up if fever >101, if symptoms worsen or if symptoms are not improved in 3 days. Patient verbalizes understanding.

## 2021-09-28 ENCOUNTER — Other Ambulatory Visit: Payer: Self-pay | Admitting: Primary Care

## 2021-09-28 DIAGNOSIS — E039 Hypothyroidism, unspecified: Secondary | ICD-10-CM

## 2021-10-01 ENCOUNTER — Other Ambulatory Visit: Payer: Self-pay | Admitting: Cardiology

## 2021-10-02 ENCOUNTER — Telehealth: Payer: Self-pay | Admitting: Cardiology

## 2021-10-02 NOTE — Telephone Encounter (Signed)
*  STAT* If patient is at the pharmacy, call can be transferred to refill team.   1. Which medications need to be refilled? (please list name of each medication and dose if known)  rosuvastatin (CRESTOR) 20 MG tablet metoprolol tartrate (LOPRESSOR) 25 MG tablet  2. Which pharmacy/location (including street and city if local pharmacy) is medication to be sent to? [place], [address]  3. Do they need a 30 day or 90 day supply?30 day  Patient is out of medication.   Patient has appt this coming Monday

## 2021-10-06 ENCOUNTER — Encounter: Payer: Self-pay | Admitting: Cardiology

## 2021-10-06 ENCOUNTER — Ambulatory Visit (INDEPENDENT_AMBULATORY_CARE_PROVIDER_SITE_OTHER): Payer: 59 | Admitting: Cardiology

## 2021-10-06 VITALS — BP 130/70 | HR 71 | Ht 62.0 in | Wt 172.4 lb

## 2021-10-06 DIAGNOSIS — I493 Ventricular premature depolarization: Secondary | ICD-10-CM

## 2021-10-06 DIAGNOSIS — R002 Palpitations: Secondary | ICD-10-CM

## 2021-10-06 DIAGNOSIS — E785 Hyperlipidemia, unspecified: Secondary | ICD-10-CM

## 2021-10-06 DIAGNOSIS — I2510 Atherosclerotic heart disease of native coronary artery without angina pectoris: Secondary | ICD-10-CM

## 2021-10-06 DIAGNOSIS — E1169 Type 2 diabetes mellitus with other specified complication: Secondary | ICD-10-CM

## 2021-10-06 MED ORDER — METOPROLOL TARTRATE 25 MG PO TABS: ORAL_TABLET | ORAL | 3 refills | Status: DC

## 2021-10-06 MED ORDER — ROSUVASTATIN CALCIUM 20 MG PO TABS: 20.0000 mg | ORAL_TABLET | Freq: Every day | ORAL | 3 refills | Status: DC

## 2021-10-06 NOTE — Patient Instructions (Signed)
Medication Instructions:  No changes   *If you need a refill on your cardiac medications before your next appointment, please call your pharmacy*   Lab Work: Not needed   Testing/Procedures: Not needed  Follow-Up: At [facility], you and your health needs are our priority.  As part of our continuing mission to provide you with exceptional heart care, we have created designated Provider Care Teams.  These Care Teams include your primary Cardiologist (physician) and Advanced Practice Providers (APPs -  Physician Assistants and Nurse Practitioners) who all work together to provide you with the care you need, when you need it.     Your next appointment:   12 month(s)  The format for your next appointment:   In Person  Provider:   Glenetta Hew, MD    Other Instructions

## 2021-10-06 NOTE — Progress Notes (Unsigned)
Primary Care Provider: [name], NP Cardiologist: Glenetta Hew, MD Electrophysiologist: None  Clinic Note: Chief Complaint  Patient presents with   Follow-up    Late annual.  Was in the hospital at the time of originally scheduled visit, was dealing with cold symptoms at the regionally rescheduled appointment time.-Unfortunately she is now out her beta-blocker and statin..   ===================================  ASSESSMENT/PLAN   Problem List Items Addressed This Visit       Cardiology Problems   Hyperlipidemia associated with type 2 diabetes mellitus ([place]) - Primary (Chronic)    Has been on rosuvastatin 20 mg daily.  We will restart.  Most recent labs from March showed LDL of 75 with a TC of 152 and TG of 112.  Pretty much within goal for her risks.  We will simply refill her simvastatin.  Labs are usually checked annually and by her PCP. Most recent A1c was 5.8.  Hopefully with her ongoing weight loss this will continue to stabilize.  She is actually no longer on medications for diabetes.       Relevant Medications   metoprolol tartrate (LOPRESSOR) 25 MG tablet   rosuvastatin (CRESTOR) 20 MG tablet   Other Relevant Orders   EKG 12-Lead (Completed)   Essentially normal coronaries per cath in 2012 (Chronic)    Minimal CAD by cath in 2012.  We discussed possibly taking Coronary Calcium Score, but probably would not change her management.  Her LDL is Promus close to 70, BP controlled, symptoms controlled.  Weight loss intact, and asymptomatic.  Would not likely change management.      Relevant Medications   metoprolol tartrate (LOPRESSOR) 25 MG tablet   rosuvastatin (CRESTOR) 20 MG tablet   PVC's (premature ventricular contractions) (Chronic)    Has been well controlled on 25 mg mg twice daily Lopressor, but she been running out of the pills and is therefore at a reduced dose to half developed twice a day.  We will go ahead and refill full 25 mg daily okay to use  additional dose if necessary for palpitations. Continue maintain adequate hydration. Avoid triggers including over-the-counter cold medicines with Sudafed, ephedrine, phenylephrine      Relevant Medications   metoprolol tartrate (LOPRESSOR) 25 MG tablet   rosuvastatin (CRESTOR) 20 MG tablet     Other   Palpitations (Chronic)    Stable -> refill beta-blocker.  See PVC section      Relevant Orders   EKG 12-Lead (Completed)   ===================================  HPI:    Abigail [name] is a (previously morbidly obese now with BMI of 56) 56 y.o. female with HLD, DM-2 being followed for Palpitations (PACs and PVCs on monitor) who presents today for delayed annual follow-up at the request of [name], NP.  Adelynne Joerger Labarbera was last seen on Jul 22, 2020.  She was concerned that her lipids not yet improved.  Happy to be A1c was improving.  Was working hard on weight loss. ,  But it hit a plateau and was upset.  Still working on her diet and exercise.  Palpitations well controlled.  Just noted at the end of the day when trying to settle down.  Still has some anxiety follow back pains and intermittent.  Labs ordered but no medication changes made.  Recent Hospitalizations/Clinic Visits:  07/14/2021: Seen in clinic by Dr. Lorelei Pont for a tick bite.  Developed nausea, clammy, headaches-started on doxycycline 100 mg twice daily (patient thinks she may have vomited up). 5/11-15/2023:  Admitted following tick bite with symptoms concerning for possible sepsis.  6-day history of N/V/D, fever and body aches along with severe headache.  She felt like she had vomited doxycycline started by PCP.  Not felt to be RMSF.  Initially treated with cefepime, Flagyl and vancomycin in the ER and switch to IV doxycycline.  This is in conjunction with IV hydration.  Ehrlichiosis labs pending, Lyme antibodies pending. => Discharged back on doxycycline. Initial PCP visit with NP on 07/23/2021 indicated that  she is feeling better overall working on extra hydration.  Still some fatigue and headaches.  No more fevers.  Other tickborne illnesses seem to be negative but not pending.  Plan was to continue with doxycycline to complete intended course and then gradually increase fluid intake along with hydration. 09/17/2021 seen for general body aches and sore throat.  Home COVID test negative.  No fever.  Body aches noted she started some unhealed doxycycline that she had.  Felt to be a viral URI.  Told to hydrate take ibuprofen and Tylenol as needed.  COVID-NEGATIVE.  Strep test negative.  Supportive care provided.  Reviewed  CV studies:    The following studies were reviewed today: (if available, images/films reviewed: From Epic Chart or Care Everywhere) None:   Interval History:   [place] presents here today overall doing pretty well she seems to have gotten over her illness from May and then URI in July.  Overall, partly assisted by her illnesses, she is dropped down 40 pounds in the last year.  She is happy with the weight loss but not necessarily having to be sick to the air.  Unfortunately, she has run out of her metoprolol and rosuvastatin being that she is at 14 months since her last visit.  For some reason the standard refill process did not work.  She clearly missed the visit because of her illness.  As such, she did notice having some palpitations when she was off the beta-blocker which she has been doing is taking a half a pill twice a day in order to try to stretch it.  She really has not had bad palpitations but has noted that at the end of the day when she is worked up she will notice some skipping and flip-flopping that was the original symptom.  Nothing prolonged nothing to suspect an arrhythmia.  No syncope or near syncope associated with it. She is try to gradually get back and exercise but still has some fatigue left over from her illnesses.  In fact he thinks that maybe being  off the beta-blocker a little bit helped with that..  CV Review of Symptoms (Summary) Cardiovascular ROS: no chest pain or dyspnea on exertion positive for - irregular heartbeat, palpitations, and some residual fatigue from illnesses.  On a positive note, she has lost 40 pounds in the last year.- negative for - chest pain, dyspnea on exertion, edema, orthopnea, paroxysmal nocturnal dyspnea, rapid heart rate, shortness of breath, or lightheadedness, dizziness or wooziness (since recovering from illness), syncope/near syncope or TIA/fugax, claudication.  REVIEWED OF SYSTEMS   Pertinent symptoms noted above in HPI: Otherwise Review of Systems  Constitutional:  Positive for malaise/fatigue (Improving as she recovers from illnesses.) and weight loss.  HENT:  Negative for nosebleeds.   Respiratory:  Negative for cough, shortness of breath and wheezing.        She is recovering from URI.  Gastrointestinal:  Negative for blood in stool and melena.  Genitourinary:  Negative  for hematuria.  Musculoskeletal:  Positive for myalgias (With the URI and tickborne illness, but not since.).  Psychiatric/Behavioral:  The patient is nervous/anxious.   All other systems reviewed and are negative.   I have reviewed and (if needed) personally updated the patient's problem list, medications, allergies, past medical and surgical history, social and family history.   PAST MEDICAL HISTORY   Past Medical History:  Diagnosis Date   Anxiety    Bulging lumbar disc    CAP (community acquired pneumonia)    Cardiac arrhythmia due to congenital heart disease    no arrhythmia identified    Celiac disease    Depression    Diverticulosis    Sigmoid colon   Fatty liver 05/2016   Frequent headaches    GERD (gastroesophageal reflux disease)    Heart disease    History of kidney stones    Hypertension    Kidney stones    Migraine    Obesity (BMI 35.0-39.9 without comorbidity)    UTI (urinary tract infection)      PAST SURGICAL HISTORY   Past Surgical History:  Procedure Laterality Date   ABDOMINAL EXPLORATION SURGERY  1986   COLONOSCOPY  06/2016   CYSTOSCOPY/URETEROSCOPY/HOLMIUM LASER/STENT PLACEMENT Right 05/16/2021   Procedure: CYSTOSCOPY/URETEROSCOPY/HOLMIUM LASER/STENT PLACEMENT;  Surgeon: Janith Lima, MD;  Location: [facility];  Service: Urology;  Laterality: Right;   ESOPHAGEAL MANOMETRY N/A 12/06/2019   Procedure: ESOPHAGEAL MANOMETRY (EM);  Surgeon: Mauri Pole, MD;  Location: [facility];  Service: Endoscopy;  Laterality: N/A;   EXTRACORPOREAL SHOCK WAVE LITHOTRIPSY Left 12/03/2016   Procedure: LEFT EXTRACORPOREAL SHOCK WAVE LITHOTRIPSY (ESWL);  Surgeon: Irine Seal, MD;  Location: [facility];  Service: Urology;  Laterality: Left;   KIDNEY STONE SURGERY     LEFT HEART CATH AND CORONARY ANGIOGRAPHY  07/28/2016    Large tortuous coronary arteries no radiographic evidence of disease.  Not anginal chest pain.  Normal EF.   LITHOTRIPSY     NASAL SINUS SURGERY     ROTATOR CUFF REPAIR Right 12/27/2017   TRANSTHORACIC ECHOCARDIOGRAM  06/2017   EF 60-65%.  GR 1-2 DD.  Otherwise normal echo.  Normal valve function.  Normal wall motion.   TUBAL LIGATION      Immunization History  Administered Date(s) Administered   Influenza-Unspecified 12/10/2014, 01/08/2020, 12/21/2020   PFIZER(Purple Top)SARS-COV-2 Vaccination 07/06/2019, 07/27/2019   Tdap 05/23/2021    MEDICATIONS/ALLERGIES   Current Meds  Medication Sig   Accu-Chek FastClix Lancets MISC USE 1 LANCET TO CHECK BLOOD SUGAR THREE TIMES DAILY   ADVIL 200 MG tablet Take 200-600 mg by mouth every 6 (six) hours as needed for mild pain or headache.   albuterol (PROAIR HFA) 108 (90 Base) MCG/ACT inhaler Inhale 2 puffs into the lungs every 6 (six) hours as needed for wheezing or shortness of breath.   Aspirin-Caffeine (BAYER BACK & BODY PO) Take 1 tablet by mouth daily as needed (body pain).   benzonatate (TESSALON) 200 MG capsule Take 1  capsule (200 mg total) by mouth 2 (two) times daily as needed for cough.   Blood Glucose Monitoring Suppl (ACCU-CHEK GUIDE) w/Device KIT by Does not apply route.   Cholecalciferol (VITAMIN D3) 1.25 MG (50000 UT) CAPS Take 1 capsule by mouth once a week (Patient taking differently: Take 50,000 Units by mouth every Sunday.)   docusate sodium (COLACE) 100 MG capsule Take 1 capsule (100 mg total) by mouth daily as needed for up to 30 doses.   esomeprazole (NEXIUM) 40  MG capsule TAKE 1 CAPSULE BY MOUTH DAILY (Patient taking differently: Take 40 mg by mouth daily as needed (heartburn).)   FLUoxetine (PROZAC) 40 MG capsule TAKE 1 CAPSULE BY MOUTH ONCE DAILY FOR ANXIETY (Patient taking differently: Take 40 mg by mouth daily.)   glucose blood (ACCU-CHEK GUIDE) test strip USE ONE STRIP THREE TIMES DAILY   HYDROcodone-acetaminophen (NORCO/VICODIN) 5-325 MG tablet Take 1 tablet by mouth every 6 (six) hours as needed.   hydrOXYzine (ATARAX/VISTARIL) 10 MG tablet Take 1 to 2 tablets by mouth twice daily as needed for anxiety. (Patient taking differently: Take 10-20 mg by mouth 2 (two) times daily as needed for anxiety.)   Lancets Misc. (ACCU-CHEK FASTCLIX LANCET) KIT Use as directed to test blood sugar daily   levothyroxine (SYNTHROID) 75 MCG tablet Take 1 tablet by mouth every morning on an empty stomach with water only.  No food or other medications for 30 minutes.   linaclotide (LINZESS) 145 MCG CAPS capsule TAKE 1 CAPSULE BY MOUTH ONCE DAILY BEFORE BREAKFAST (Patient taking differently: Take 145 mcg by mouth at bedtime.)   ondansetron (ZOFRAN-ODT) 4 MG disintegrating tablet Take 1 tablet (4 mg total) by mouth every 8 (eight) hours as needed for nausea or vomiting.   oxybutynin (DITROPAN-XL) 5 MG 24 hr tablet Take 5 mg by mouth daily.   traMADol (ULTRAM) 50 MG tablet Take 50 mg by mouth every 4 (four) hours as needed.   [Refilled] metoprolol tartrate (LOPRESSOR) 25 MG tablet TAKE 1 TABLET BY MOUTH TWICE DAILY .  APPOINTMENT REQUIRED FOR FUTURE REFILLS WITH  DR  [facility]  FOR  FUTURE  REFILLS   [Refill] rosuvastatin (CRESTOR) 20 MG tablet Take 1 tablet by mouth once daily    Allergies  Allergen Reactions   Latex Rash   Penicillins Nausea Only    Has patient had a PCN reaction causing immediate rash, facial/tongue/throat swelling, SOB or lightheadedness with hypotension: No Has patient had a PCN reaction causing severe rash involving mucus membranes or skin necrosis: No Has patient had a PCN reaction that required hospitalization: No Has patient had a PCN reaction occurring within the last 10 years: No If all of the above answers are "NO", then may proceed with Cephalosporin use.     SOCIAL HISTORY/FAMILY HISTORY   Reviewed in Epic:  Pertinent findings:  Social History   Tobacco Use   Smoking status: Former    Types: Cigarettes    Quit date: 2008    Years since quitting: 15.6   Smokeless tobacco: Never   Tobacco comments:    used tobacco "socially"  Scientific laboratory technician Use: Never used  Substance Use Topics   Alcohol use: No    Alcohol/week: 0.0 standard drinks of alcohol   Drug use: No   Social History   Social History Narrative   Single.   Has a set of Twins, age 109.   Works for the [employer] and [employer] and [employer]   Enjoys relaxing, spending time with her children.    OBJCTIVE -PE, EKG, labs   Wt Readings from Last 3 Encounters:  10/06/21 172 lb 6.4 oz (78.2 kg)  09/17/21 171 lb (77.6 kg)  07/23/21 180 lb (81.6 kg)    Physical Exam: BP 130/70   Pulse 71   Ht 5' 2"  (1.575 m)   Wt 172 lb 6.4 oz (78.2 kg)   LMP 08/12/2015 Comment: patient states hx. tubal ligation  SpO2 97%   BMI 31.53 kg/m  Physical  Exam Vitals reviewed.  Constitutional:      General: She is not in acute distress.    Appearance: Normal appearance. She is obese. She is not ill-appearing or toxic-appearing.     Comments: Although she is listed as obese, she is 40 pounds less than  last visit.  Healthy-appearing.  Well-groomed.  In much better spirits.  HENT:     Head: Normocephalic and atraumatic.  Neck:     Vascular: No carotid bruit or JVD.  Cardiovascular:     Rate and Rhythm: Normal rate and regular rhythm. No extrasystoles are present.    Chest Wall: PMI is not displaced.     Pulses: Normal pulses.     Heart sounds: S1 normal and S2 normal. No murmur heard.    No friction rub. No gallop.  Pulmonary:     Effort: Pulmonary effort is normal. No respiratory distress.     Breath sounds: Normal breath sounds. No wheezing, rhonchi or rales.  Chest:     Chest wall: No tenderness.  Musculoskeletal:        General: No swelling. Normal range of motion.     Cervical back: Normal range of motion and neck supple.  Skin:    General: Skin is warm and dry.     Coloration: Skin is not pale.  Neurological:     General: No focal deficit present.     Mental Status: She is alert and oriented to person, place, and time. Mental status is at baseline.     Gait: Gait normal.  Psychiatric:        Mood and Affect: Mood normal.        Behavior: Behavior normal.        Thought Content: Thought content normal.        Judgment: Judgment normal.     Adult ECG Report  Rate: 71 ;  Rhythm: normal sinus rhythm and nonspecific ST-T wave changes.  Otherwise normal axis, intervals and durations. ;   Narrative Interpretation: Stable  Recent Labs: Reviewed Lab Results  Component Value Date   CHOL 152 05/23/2021   HDL 54.40 05/23/2021   LDLCALC 75 05/23/2021   TRIG 112.0 05/23/2021   CHOLHDL 3 05/23/2021   Lab Results  Component Value Date   CREATININE 0.68 07/23/2021   BUN 8 07/23/2021   NA 141 07/23/2021   K 4.1 07/23/2021   CL 104 07/23/2021   CO2 31 07/23/2021      Latest Ref Rng & Units 07/21/2021    1:59 AM 07/20/2021   12:41 AM 07/19/2021   12:59 AM  CBC  WBC 4.0 - 10.5 K/uL 6.4  5.9  6.2   Hemoglobin 12.0 - 15.0 g/dL 11.9  11.6  11.1   Hematocrit 36.0 - 46.0 %  35.3  35.9  33.3   Platelets 150 - 400 K/uL 207  197  173     Lab Results  Component Value Date   HGBA1C 5.8 05/23/2021   Lab Results  Component Value Date   TSH 1.74 05/23/2021    ================================================== I spent a total of 13 minutes with the patient spent in direct patient consultation.  Additional time spent with chart review  / charting (studies, outside notes, etc): 18 min Total Time: 31 min  Current medicines are reviewed at length with the patient today.  (+/- concerns) none  Notice: This dictation was prepared with Dragon dictation along with smart phrase technology. Any transcriptional errors that result from this process are unintentional  and may not be corrected upon review.  Studies Ordered:   Orders Placed This Encounter  Procedures   EKG 12-Lead   Meds ordered this encounter  Medications   metoprolol tartrate (LOPRESSOR) 25 MG tablet    Sig: TAKE 1 TABLET BY MOUTH TWICE DAILY .    Dispense:  180 tablet    Refill:  3   rosuvastatin (CRESTOR) 20 MG tablet    Sig: Take 1 tablet (20 mg total) by mouth daily.    Dispense:  90 tablet    Refill:  3    Patient Instructions / Medication Changes & Studies & Tests Ordered   Patient Instructions  Medication Instructions:  No changes   *If you need a refill on your cardiac medications before your next appointment, please call your pharmacy*   Lab Work: Not needed   Testing/Procedures: Not needed  Follow-Up: At [facility], you and your health needs are our priority.  As part of our continuing mission to provide you with exceptional heart care, we have created designated Provider Care Teams.  These Care Teams include your primary Cardiologist (physician) and Advanced Practice Providers (APPs -  Physician Assistants and Nurse Practitioners) who all work together to provide you with the care you need, when you need it.     Your next appointment:   12 month(s)  The format for  your next appointment:   In Person  Provider:   Glenetta Hew, MD    Other Instructions      Delane Ginger, M.D., M.S. Interventional Cardiologist  [place]  Pager # [phone] Phone # [phone] [address] [place], [place] [address]   Thank you for choosing [facility] [place]!!

## 2021-11-02 ENCOUNTER — Encounter: Payer: Self-pay | Admitting: Cardiology

## 2021-11-02 NOTE — Assessment & Plan Note (Addendum)
Has been well controlled on 25 mg mg twice daily Lopressor, but she been running out of the pills and is therefore at a reduced dose to half developed twice a day.  We will go ahead and refill full 25 mg daily okay to use additional dose if necessary for palpitations. Continue maintain adequate hydration. Avoid triggers including over-the-counter cold medicines with Sudafed, ephedrine, phenylephrine

## 2021-11-02 NOTE — Assessment & Plan Note (Addendum)
Has been on rosuvastatin 20 mg daily.  We will restart.  Most recent labs from March showed LDL of 75 with a TC of 152 and TG of 112.  Pretty much within goal for her risks.  We will simply refill her simvastatin.  Labs are usually checked annually and by her PCP. Most recent A1c was 5.8.  Hopefully with her ongoing weight loss this will continue to stabilize.  She is actually no longer on medications for diabetes.

## 2021-11-02 NOTE — Assessment & Plan Note (Signed)
Minimal CAD by cath in 2012.  We discussed possibly taking Coronary Calcium Score, but probably would not change her management.  Her LDL is Promus close to 70, BP controlled, symptoms controlled.  Weight loss intact, and asymptomatic.  Would not likely change management.

## 2021-11-02 NOTE — Assessment & Plan Note (Signed)
Stable -> refill beta-blocker.  See PVC section

## 2021-11-21 ENCOUNTER — Other Ambulatory Visit: Payer: Self-pay | Admitting: Primary Care

## 2021-11-21 DIAGNOSIS — E559 Vitamin D deficiency, unspecified: Secondary | ICD-10-CM

## 2021-12-17 LAB — HM MAMMOGRAPHY

## 2021-12-19 ENCOUNTER — Encounter: Payer: Self-pay | Admitting: Primary Care

## 2022-01-23 ENCOUNTER — Encounter: Payer: Self-pay | Admitting: Primary Care

## 2022-01-23 ENCOUNTER — Ambulatory Visit (INDEPENDENT_AMBULATORY_CARE_PROVIDER_SITE_OTHER): Payer: 59 | Admitting: Primary Care

## 2022-01-23 VITALS — BP 110/60 | HR 75 | Temp 98.1°F | Ht 62.0 in | Wt 178.0 lb

## 2022-01-23 DIAGNOSIS — R109 Unspecified abdominal pain: Secondary | ICD-10-CM

## 2022-01-23 LAB — POC URINALSYSI DIPSTICK (AUTOMATED)
Bilirubin, UA: NEGATIVE
Blood, UA: POSITIVE
Glucose, UA: NEGATIVE
Ketones, UA: NEGATIVE
Leukocytes, UA: NEGATIVE
Nitrite, UA: NEGATIVE
Protein, UA: POSITIVE — AB
Spec Grav, UA: 1.025 (ref 1.010–1.025)
Urobilinogen, UA: 0.2 E.U./dL
pH, UA: 5.5 (ref 5.0–8.0)

## 2022-01-23 MED ORDER — TAMSULOSIN HCL 0.4 MG PO CAPS: 0.4000 mg | ORAL_CAPSULE | Freq: Every day | ORAL | 0 refills | Status: DC

## 2022-01-23 MED ORDER — ONDANSETRON 4 MG PO TBDP: 4.0000 mg | ORAL_TABLET | Freq: Three times a day (TID) | ORAL | 0 refills | Status: DC | PRN

## 2022-01-23 NOTE — Progress Notes (Signed)
Subjective:    Patient ID: Abigail [name], female    DOB: 1965-05-03, 56 y.o.   MRN: [id number]  Abdominal Pain Associated symptoms include nausea. Pertinent negatives include no fever, hematuria or vomiting.    Abigail [name] is a very pleasant 56 y.o. female with a history of renal stones, cystitis, type 2 diabetes, hyperlipidemia, fatigue, incomplete bladder emptying who presents today to discuss abdominal pain.   Her pain is located to the left [name] trunk/side which began about 2 weeks ago. Now she feels pain and pressure to her left [name] abdomen/groin. She then began to notice pelvic/bladder pressure, increased urinary incontinence, and contraction like pains. She has noticed a darker color to her urine. Her symptoms feel like her prior renal stones. Her last renal stone was in March 2023, had to be surgically removed.   She denies dysuria, vaginal discharge, hematuria. She typically drinks 100 oz of water daily, but since she changed roles at work she has decreased her intake to 36-40 oz daily.   She's been taking tamsulosin 0.4 mg HS for the last one week. She's also been drinking cranberry juice.   Review of Systems  Constitutional:  Negative for fever.  Gastrointestinal:  Positive for abdominal pain and nausea. Negative for vomiting.  Genitourinary:  Positive for flank pain and pelvic pain. Negative for hematuria and vaginal discharge.         Past Medical History:  Diagnosis Date   Anxiety    Bulging lumbar disc    CAP (community acquired pneumonia)    Cardiac arrhythmia due to congenital heart disease    no arrhythmia identified    Celiac disease    Depression    Diverticulosis    Sigmoid colon   Fatty liver 05/2016   Frequent headaches    GERD (gastroesophageal reflux disease)    Heart disease    History of kidney stones    Hypertension    Kidney stones    Migraine    Obesity (BMI 35.0-39.9 without comorbidity)    UTI (urinary tract infection)      Social History   Socioeconomic History   Marital status: Single    Spouse name: Not on file   Number of children: Not on file   Years of education: Not on file   Highest education level: Not on file  Occupational History   Not on file  Tobacco Use   Smoking status: Former    Types: Cigarettes    Quit date: 2008    Years since quitting: 15.8   Smokeless tobacco: Never   Tobacco comments:    used tobacco "socially"  Scientific laboratory technician Use: Never used  Substance and Sexual Activity   Alcohol use: No    Alcohol/week: 0.0 standard drinks of alcohol   Drug use: No   Sexual activity: Not on file  Other Topics Concern   Not on file  Social History Narrative   Single.   Has a set of Twins, age 72.   Works for the [employer] and [employer] and [employer]   Enjoys relaxing, spending time with her children.   Social Determinants of Health   Financial Resource Strain: Not on file  Food Insecurity: Not on file  Transportation Needs: Not on file  Physical Activity: Not on file  Stress: Not on file  Social Connections: Not on file  Intimate Partner Violence: Not on file    Past Surgical History:  Procedure Laterality Date  [place]   COLONOSCOPY  06/2016   CYSTOSCOPY/URETEROSCOPY/HOLMIUM LASER/STENT PLACEMENT Right 05/16/2021   Procedure: CYSTOSCOPY/URETEROSCOPY/HOLMIUM LASER/STENT PLACEMENT;  Surgeon: Janith Lima, MD;  Location: [facility];  Service: Urology;  Laterality: Right;   ESOPHAGEAL MANOMETRY N/A 12/06/2019   Procedure: ESOPHAGEAL MANOMETRY (EM);  Surgeon: Mauri Pole, MD;  Location: [facility];  Service: Endoscopy;  Laterality: N/A;   EXTRACORPOREAL SHOCK WAVE LITHOTRIPSY Left 12/03/2016   Procedure: LEFT EXTRACORPOREAL SHOCK WAVE LITHOTRIPSY (ESWL);  Surgeon: Irine Seal, MD;  Location: [facility];  Service: Urology;  Laterality: Left;   KIDNEY STONE SURGERY     LEFT HEART CATH AND CORONARY ANGIOGRAPHY  07/28/2016    Large  tortuous coronary arteries no radiographic evidence of disease.  Not anginal chest pain.  Normal EF.   LITHOTRIPSY     NASAL SINUS SURGERY     ROTATOR CUFF REPAIR Right 12/27/2017   TRANSTHORACIC ECHOCARDIOGRAM  06/2017   EF 60-65%.  GR 1-2 DD.  Otherwise normal echo.  Normal valve function.  Normal wall motion.   TUBAL LIGATION      Family History  Problem Relation Age of Onset   Gallstones Mother    COPD Father    Emphysema Father    Colon cancer Neg Hx    Stomach cancer Neg Hx    Esophageal cancer Neg Hx    Pancreatic cancer Neg Hx    Liver disease Neg Hx     Allergies  Allergen Reactions   Latex Rash   Penicillins Nausea Only    Has patient had a PCN reaction causing immediate rash, facial/tongue/throat swelling, SOB or lightheadedness with hypotension: No Has patient had a PCN reaction causing severe rash involving mucus membranes or skin necrosis: No Has patient had a PCN reaction that required hospitalization: No Has patient had a PCN reaction occurring within the last 10 years: No If all of the above answers are "NO", then may proceed with Cephalosporin use.     Current Outpatient Medications on File Prior to Visit  Medication Sig Dispense Refill   Accu-Chek FastClix Lancets MISC USE 1 LANCET TO CHECK BLOOD SUGAR THREE TIMES DAILY 300 each 1   ADVIL 200 MG tablet Take 200-600 mg by mouth every 6 (six) hours as needed for mild pain or headache.     albuterol (PROAIR HFA) 108 (90 Base) MCG/ACT inhaler Inhale 2 puffs into the lungs every 6 (six) hours as needed for wheezing or shortness of breath. 8 g 1   Aspirin-Caffeine (BAYER BACK & BODY PO) Take 1 tablet by mouth daily as needed (body pain).     benzonatate (TESSALON) 200 MG capsule Take 1 capsule (200 mg total) by mouth 2 (two) times daily as needed for cough. 20 capsule 0   Blood Glucose Monitoring Suppl (ACCU-CHEK GUIDE) w/Device KIT by Does not apply route.     Cholecalciferol (VITAMIN D3) 1.25 MG (50000 UT) CAPS  Take 1 capsule by mouth once a week 12 capsule 0   docusate sodium (COLACE) 100 MG capsule Take 1 capsule (100 mg total) by mouth daily as needed for up to 30 doses. 30 capsule 0   esomeprazole (NEXIUM) 40 MG capsule TAKE 1 CAPSULE BY MOUTH DAILY (Patient taking differently: Take 40 mg by mouth daily as needed (heartburn).) 90 capsule 1   FLUoxetine (PROZAC) 40 MG capsule TAKE 1 CAPSULE BY MOUTH ONCE DAILY FOR ANXIETY (Patient taking differently: Take 40 mg by mouth daily.) 90 capsule 2   glucose  blood (ACCU-CHEK GUIDE) test strip USE ONE STRIP THREE TIMES DAILY 300 each 1   HYDROcodone-acetaminophen (NORCO/VICODIN) 5-325 MG tablet Take 1 tablet by mouth every 6 (six) hours as needed.     hydrOXYzine (ATARAX/VISTARIL) 10 MG tablet Take 1 to 2 tablets by mouth twice daily as needed for anxiety. (Patient taking differently: Take 10-20 mg by mouth 2 (two) times daily as needed for anxiety.) 30 tablet 0   Lancets Misc. (ACCU-CHEK FASTCLIX LANCET) KIT Use as directed to test blood sugar daily 1 kit 0   levothyroxine (SYNTHROID) 75 MCG tablet Take 1 tablet by mouth every morning on an empty stomach with water only.  No food or other medications for 30 minutes. 90 tablet 1   linaclotide (LINZESS) 145 MCG CAPS capsule TAKE 1 CAPSULE BY MOUTH ONCE DAILY BEFORE BREAKFAST (Patient taking differently: Take 145 mcg by mouth at bedtime.) 90 capsule 3   metoprolol tartrate (LOPRESSOR) 25 MG tablet TAKE 1 TABLET BY MOUTH TWICE DAILY . 180 tablet 3   oxybutynin (DITROPAN-XL) 5 MG 24 hr tablet Take 5 mg by mouth daily.  1   rosuvastatin (CRESTOR) 20 MG tablet Take 1 tablet (20 mg total) by mouth daily. 90 tablet 3   traMADol (ULTRAM) 50 MG tablet Take 50 mg by mouth every 4 (four) hours as needed.     No current facility-administered medications on file prior to visit.    BP 110/60   Pulse 75   Temp 98.1 F (36.7 C) (Temporal)   Ht _0  (1.575 m)   Wt 178 lb (80.7 kg)   LMP 08/12/2015 Comment: patient states  hx. tubal ligation  SpO2 97%   BMI 32.56 kg/m  Objective:   Physical Exam Constitutional:      General: She is not in acute distress. Cardiovascular:     Rate and Rhythm: Normal rate.  Abdominal:     Palpations: Abdomen is soft.     Tenderness: There is abdominal tenderness in the suprapubic area and left [name] quadrant. There is no right CVA tenderness or left CVA tenderness.  Neurological:     Mental Status: She is alert.           Assessment & Plan:   Problem List Items Addressed This Visit       Other   Flank pain - Primary    Symptoms and presentation today representative of renal stone. Unfortunately, our xray technician is out, otherwise a KUB would have been obtained.  UA today with 3+ blood, otherwise negative. Culture sent and pending.  Continue Tamsulosin 0.4 mg daily. Refill provided. Use Zofran PRN, refill provided.  Continue to push intake of water. ED precautions provided.  Recommended she call her Urologist Monday if no improvement.       Relevant Medications   ondansetron (ZOFRAN-ODT) 4 MG disintegrating tablet   tamsulosin (FLOMAX) 0.4 MG CAPS capsule   Other Relevant Orders   POCT Urinalysis Dipstick (Automated) (Completed)   Urine Culture       [name], NP

## 2022-01-23 NOTE — Assessment & Plan Note (Addendum)
Symptoms and presentation today representative of renal stone. Unfortunately, our xray technician is out, otherwise a KUB would have been obtained.  UA today with 3+ blood, otherwise negative. Culture sent and pending.  Continue Tamsulosin 0.4 mg daily. Refill provided. Use Zofran PRN, refill provided.  Continue to push intake of water. ED precautions provided.  Recommended she call her Urologist Monday if no improvement.

## 2022-01-25 LAB — URINE CULTURE
MICRO NUMBER:: 14205302
SPECIMEN QUALITY:: ADEQUATE

## 2022-01-30 ENCOUNTER — Other Ambulatory Visit: Payer: Self-pay | Admitting: Gastroenterology

## 2022-02-02 ENCOUNTER — Encounter: Payer: Self-pay | Admitting: Internal Medicine

## 2022-02-02 ENCOUNTER — Ambulatory Visit: Payer: 59 | Admitting: Internal Medicine

## 2022-02-02 VITALS — BP 100/70 | HR 74 | Temp 97.1°F | Ht 62.0 in | Wt 177.0 lb

## 2022-02-02 DIAGNOSIS — J029 Acute pharyngitis, unspecified: Secondary | ICD-10-CM | POA: Insufficient documentation

## 2022-02-02 DIAGNOSIS — R051 Acute cough: Secondary | ICD-10-CM

## 2022-02-02 LAB — POC INFLUENZA A&B (BINAX/QUICKVUE)
Influenza A, POC: NEGATIVE
Influenza B, POC: NEGATIVE

## 2022-02-02 LAB — POCT RAPID STREP A (OFFICE): Rapid Strep A Screen: NEGATIVE

## 2022-02-02 MED ORDER — ALBUTEROL SULFATE HFA 108 (90 BASE) MCG/ACT IN AERS: 2.0000 | INHALATION_SPRAY | Freq: Four times a day (QID) | RESPIRATORY_TRACT | 0 refills | Status: DC | PRN

## 2022-02-02 MED ORDER — BENZONATATE 200 MG PO CAPS: 200.0000 mg | ORAL_CAPSULE | Freq: Three times a day (TID) | ORAL | 0 refills | Status: DC | PRN

## 2022-02-02 NOTE — Assessment & Plan Note (Signed)
COVID negative at home Flu and strep negative here Will refill albuterol and benzonatate Out of work for next 1-2 days If worsens with breathing, will consider a few days of prednisone analgesics

## 2022-02-02 NOTE — Addendum Note (Signed)
Addended by: Pilar Grammes on: 02/02/2022 03:07 PM   Modules accepted: Orders

## 2022-02-02 NOTE — Progress Notes (Signed)
Subjective:    Patient ID: Abigail [name], female    DOB: 03/07/1966, 56 y.o.   MRN: [id number]  HPI Here due to a sore throat  Started 2 days ago--mild dry, sore throat Yesterday--it got was "like swallowing glass" Slight cough Now with body aching and feels worse this morning--had to call out of work No congestion or drainage No ear pain Some sweats in past 2 days---felt hot "like burning up" Some SOB---using inhaler for that  No other Rx  Current Outpatient Medications on File Prior to Visit  Medication Sig Dispense Refill   Accu-Chek FastClix Lancets MISC USE 1 LANCET TO CHECK BLOOD SUGAR THREE TIMES DAILY 300 each 1   ADVIL 200 MG tablet Take 200-600 mg by mouth every 6 (six) hours as needed for mild pain or headache.     albuterol (PROAIR HFA) 108 (90 Base) MCG/ACT inhaler Inhale 2 puffs into the lungs every 6 (six) hours as needed for wheezing or shortness of breath. 8 g 1   Aspirin-Caffeine (BAYER BACK & BODY PO) Take 1 tablet by mouth daily as needed (body pain).     Blood Glucose Monitoring Suppl (ACCU-CHEK GUIDE) w/Device KIT by Does not apply route.     Cholecalciferol (VITAMIN D3) 1.25 MG (50000 UT) CAPS Take 1 capsule by mouth once a week 12 capsule 0   docusate sodium (COLACE) 100 MG capsule Take 1 capsule (100 mg total) by mouth daily as needed for up to 30 doses. 30 capsule 0   esomeprazole (NEXIUM) 40 MG capsule TAKE 1 CAPSULE BY MOUTH DAILY (Patient taking differently: Take 40 mg by mouth daily as needed (heartburn).) 90 capsule 1   FLUoxetine (PROZAC) 40 MG capsule TAKE 1 CAPSULE BY MOUTH ONCE DAILY FOR ANXIETY (Patient taking differently: Take 40 mg by mouth daily.) 90 capsule 2   glucose blood (ACCU-CHEK GUIDE) test strip USE ONE STRIP THREE TIMES DAILY 300 each 1   HYDROcodone-acetaminophen (NORCO/VICODIN) 5-325 MG tablet Take 1 tablet by mouth every 6 (six) hours as needed.     hydrOXYzine (ATARAX/VISTARIL) 10 MG tablet Take 1 to 2 tablets by mouth twice  daily as needed for anxiety. (Patient taking differently: Take 10-20 mg by mouth 2 (two) times daily as needed for anxiety.) 30 tablet 0   Lancets Misc. (ACCU-CHEK FASTCLIX LANCET) KIT Use as directed to test blood sugar daily 1 kit 0   levothyroxine (SYNTHROID) 75 MCG tablet Take 1 tablet by mouth every morning on an empty stomach with water only.  No food or other medications for 30 minutes. 90 tablet 1   linaclotide (LINZESS) 145 MCG CAPS capsule Take 1 capsule (145 mcg total) by mouth at bedtime. 30 capsule 3   metoprolol tartrate (LOPRESSOR) 25 MG tablet TAKE 1 TABLET BY MOUTH TWICE DAILY . 180 tablet 3   ondansetron (ZOFRAN-ODT) 4 MG disintegrating tablet Take 1 tablet (4 mg total) by mouth every 8 (eight) hours as needed for nausea or vomiting. 20 tablet 0   oxybutynin (DITROPAN-XL) 5 MG 24 hr tablet Take 5 mg by mouth daily.  1   rosuvastatin (CRESTOR) 20 MG tablet Take 1 tablet (20 mg total) by mouth daily. 90 tablet 3   tamsulosin (FLOMAX) 0.4 MG CAPS capsule Take 1 capsule (0.4 mg total) by mouth daily. 60 capsule 0   traMADol (ULTRAM) 50 MG tablet Take 50 mg by mouth every 4 (four) hours as needed.     No current facility-administered medications on file prior to visit.  Allergies  Allergen Reactions   Latex Rash   Penicillins Nausea Only    Has patient had a PCN reaction causing immediate rash, facial/tongue/throat swelling, SOB or lightheadedness with hypotension: No Has patient had a PCN reaction causing severe rash involving mucus membranes or skin necrosis: No Has patient had a PCN reaction that required hospitalization: No Has patient had a PCN reaction occurring within the last 10 years: No If all of the above answers are "NO", then may proceed with Cephalosporin use.     Past Medical History:  Diagnosis Date   Anxiety    Bulging lumbar disc    CAP (community acquired pneumonia)    Cardiac arrhythmia due to congenital heart disease    no arrhythmia identified     Celiac disease    Depression    Diverticulosis    Sigmoid colon   Fatty liver 05/2016   Frequent headaches    GERD (gastroesophageal reflux disease)    Heart disease    History of kidney stones    Hypertension    Kidney stones    Migraine    Obesity (BMI 35.0-39.9 without comorbidity)    UTI (urinary tract infection)     Past Surgical History:  Procedure Laterality Date   ABDOMINAL EXPLORATION SURGERY  1986   COLONOSCOPY  06/2016   CYSTOSCOPY/URETEROSCOPY/HOLMIUM LASER/STENT PLACEMENT Right 05/16/2021   Procedure: CYSTOSCOPY/URETEROSCOPY/HOLMIUM LASER/STENT PLACEMENT;  Surgeon: Janith Lima, MD;  Location: [facility];  Service: Urology;  Laterality: Right;   ESOPHAGEAL MANOMETRY N/A 12/06/2019   Procedure: ESOPHAGEAL MANOMETRY (EM);  Surgeon: Mauri Pole, MD;  Location: [facility];  Service: Endoscopy;  Laterality: N/A;   EXTRACORPOREAL SHOCK WAVE LITHOTRIPSY Left 12/03/2016   Procedure: LEFT EXTRACORPOREAL SHOCK WAVE LITHOTRIPSY (ESWL);  Surgeon: Irine Seal, MD;  Location: [facility];  Service: Urology;  Laterality: Left;   KIDNEY STONE SURGERY     LEFT HEART CATH AND CORONARY ANGIOGRAPHY  07/28/2016    Large tortuous coronary arteries no radiographic evidence of disease.  Not anginal chest pain.  Normal EF.   LITHOTRIPSY     NASAL SINUS SURGERY     ROTATOR CUFF REPAIR Right 12/27/2017   TRANSTHORACIC ECHOCARDIOGRAM  06/2017   EF 60-65%.  GR 1-2 DD.  Otherwise normal echo.  Normal valve function.  Normal wall motion.   TUBAL LIGATION      Family History  Problem Relation Age of Onset   Gallstones Mother    COPD Father    Emphysema Father    Colon cancer Neg Hx    Stomach cancer Neg Hx    Esophageal cancer Neg Hx    Pancreatic cancer Neg Hx    Liver disease Neg Hx     Social History   Socioeconomic History   Marital status: Single    Spouse name: Not on file   Number of children: Not on file   Years of education: Not on file   Highest education level: Not on  file  Occupational History   Not on file  Tobacco Use   Smoking status: Former    Types: Cigarettes    Quit date: 2008    Years since quitting: 15.9   Smokeless tobacco: Never   Tobacco comments:    used tobacco "socially"  Scientific laboratory technician Use: Never used  Substance and Sexual Activity   Alcohol use: No    Alcohol/week: 0.0 standard drinks of alcohol   Drug use: No   Sexual activity: Not on file  Other Topics Concern   Not on file  Social History Narrative   Single.   Has a set of Twins, age 62.   Works for the [employer] and [employer] and [employer]   Enjoys relaxing, spending time with her children.   Social Determinants of Health   Financial Resource Strain: Not on file  Food Insecurity: Not on file  Transportation Needs: Not on file  Physical Activity: Not on file  Stress: Not on file  Social Connections: Not on file  Intimate Partner Violence: Not on file   Review of Systems Able to drink water--but not juice or carbonated drinks Is able to eat No rash     Objective:   Physical Exam Constitutional:      Appearance: Normal appearance.  HENT:     Head:     Comments: Slight maxillary tenderness    Right Ear: Tympanic membrane and ear canal normal.     Left Ear: Tympanic membrane and ear canal normal.     Mouth/Throat:     Comments: Redness in pharynx No abscess or exudates Pulmonary:     Breath sounds: No wheezing or rales.     Comments: Slightly decreased breath sounds but not tight or wheezy Musculoskeletal:     Cervical back: Neck supple.  Lymphadenopathy:     Cervical: No cervical adenopathy.  Neurological:     Mental Status: She is alert.            Assessment & Plan:

## 2022-02-06 ENCOUNTER — Other Ambulatory Visit: Payer: Self-pay | Admitting: Primary Care

## 2022-02-06 DIAGNOSIS — E559 Vitamin D deficiency, unspecified: Secondary | ICD-10-CM

## 2022-03-06 ENCOUNTER — Emergency Department (HOSPITAL_COMMUNITY)
Admission: EM | Admit: 2022-03-06 | Discharge: 2022-03-06 | Disposition: A | Discharge status: Home / Self Care | Payer: 59 | Attending: Emergency Medicine | Admitting: Emergency Medicine

## 2022-03-06 ENCOUNTER — Telehealth: Payer: Self-pay | Admitting: Cardiology

## 2022-03-06 ENCOUNTER — Emergency Department (HOSPITAL_COMMUNITY): Payer: 59

## 2022-03-06 DIAGNOSIS — R091 Pleurisy: Secondary | ICD-10-CM | POA: Diagnosis not present

## 2022-03-06 DIAGNOSIS — Z91040 Latex allergy status: Secondary | ICD-10-CM | POA: Diagnosis not present

## 2022-03-06 DIAGNOSIS — E119 Type 2 diabetes mellitus without complications: Secondary | ICD-10-CM | POA: Insufficient documentation

## 2022-03-06 DIAGNOSIS — Z7982 Long term (current) use of aspirin: Secondary | ICD-10-CM | POA: Insufficient documentation

## 2022-03-06 DIAGNOSIS — B372 Candidiasis of skin and nail: Secondary | ICD-10-CM | POA: Diagnosis not present

## 2022-03-06 DIAGNOSIS — R079 Chest pain, unspecified: Secondary | ICD-10-CM | POA: Diagnosis present

## 2022-03-06 DIAGNOSIS — Z79899 Other long term (current) drug therapy: Secondary | ICD-10-CM | POA: Diagnosis not present

## 2022-03-06 DIAGNOSIS — R911 Solitary pulmonary nodule: Secondary | ICD-10-CM | POA: Diagnosis not present

## 2022-03-06 LAB — CBC
HCT: 40.5 % (ref 36.0–46.0)
Hemoglobin: 13.6 g/dL (ref 12.0–15.0)
MCH: 29.7 pg (ref 26.0–34.0)
MCHC: 33.6 g/dL (ref 30.0–36.0)
MCV: 88.4 fL (ref 80.0–100.0)
Platelets: 253 10*3/uL (ref 150–400)
RBC: 4.58 MIL/uL (ref 3.87–5.11)
RDW: 12.8 % (ref 11.5–15.5)
WBC: 9 10*3/uL (ref 4.0–10.5)
nRBC: 0 % (ref 0.0–0.2)

## 2022-03-06 LAB — BASIC METABOLIC PANEL
Anion gap: 7 (ref 5–15)
BUN: 9 mg/dL (ref 6–20)
CO2: 25 mmol/L (ref 22–32)
Calcium: 9 mg/dL (ref 8.9–10.3)
Chloride: 106 mmol/L (ref 98–111)
Creatinine, Ser: 0.67 mg/dL (ref 0.44–1.00)
GFR, Estimated: >60 mL/min (ref >60)
Glucose, Bld: 99 mg/dL (ref 70–99)
Potassium: 3.8 mmol/L (ref 3.5–5.1)
Sodium: 138 mmol/L (ref 135–145)

## 2022-03-06 LAB — TROPONIN I (HIGH SENSITIVITY)
Troponin I (High Sensitivity): <2 ng/L (ref <18)
Troponin I (High Sensitivity): <2 ng/L (ref <18)

## 2022-03-06 LAB — I-STAT BETA HCG BLOOD, ED (MC, WL, AP ONLY): I-stat hCG, quantitative: <5 m[IU]/mL (ref <5)

## 2022-03-06 MED ORDER — CLOTRIMAZOLE 1 % EX CREA: TOPICAL_CREAM | CUTANEOUS | 0 refills | Status: AC

## 2022-03-06 MED ORDER — IOHEXOL 350 MG/ML SOLN
50.0000 mL | Freq: Once | INTRAVENOUS | Status: AC | PRN
  Administered 2022-03-06: 50 mL via INTRAVENOUS

## 2022-03-06 MED ORDER — ONDANSETRON 4 MG PO TBDP: ORAL_TABLET | ORAL | 0 refills | Status: DC

## 2022-03-06 MED ORDER — KETOROLAC TROMETHAMINE 15 MG/ML IJ SOLN
15.0000 mg | Freq: Once | INTRAMUSCULAR | Status: AC
  Administered 2022-03-06: 15 mg via INTRAVENOUS
  Filled 2022-03-06: qty 1

## 2022-03-06 NOTE — Discharge Instructions (Addendum)
Follow-up with Dr. Ellyn Hack regarding her chest pain.  He had some small pulmonary nodules which likely do not need outpatient follow-up but you can discuss this with your primary care doctor.  If you have any risk factors for cancer, he will need a repeat scan in 1 year which can be ordered by your primary care doctor.  Return to the emergency room if you have any worsening symptoms.

## 2022-03-06 NOTE — ED Triage Notes (Addendum)
Pt to ED via EMS c/o intermittent chest pain x 2 weeks. Denies SHOB , reports nausea. NSR.  324MG ASPIRIN, 2 NITRO given by EMS.  #20 L hand.  Last VS: 104/60, HR 70, RR 16  Pt reports cough x 2 weeks when symptoms started.

## 2022-03-06 NOTE — ED Notes (Signed)
RN reviewed discharge instructions with pt. Pt verbalized understanding and had no further questions. VSS upon discharge.  

## 2022-03-06 NOTE — Telephone Encounter (Signed)
Spoke with patient of Dr. Ellyn Hack who reports chest pain on 12/22. She reports feeling like a knife was in her chest. She also reports discomfort in her back and into shoulder blades. No OTC pain meds have given her relief. She said the pain is now constant and occurs at rest, feeling like her chest is contracting. She reports fatigue and shortness of breath. She has been waking up diaphoretic.   Explained that she needs to go to [facility] ED for evaluation. There are no open visits at any [facility] office today and our office is closed on Monday. She wants to only see Dr. Ellyn Hack. Explained to her there are no visits with him in the next few weeks. Patient did say she will go to ED  Attempted to call [facility] to speak with triage nurse but unable to get through.

## 2022-03-06 NOTE — ED Provider Notes (Signed)
[facility] Provider Note   CSN: [id number] Arrival date & time: 03/06/22  1705     History  Chief Complaint  Patient presents with   Chest Pain    Abigail [name] is a 56 y.o. female.  Patient is a 56 year old female with a history of diabetes, hyperlipidemia who presents with chest pain.  She states she started having a cough around Thanksgiving.  She saw her PCP who did not feel that she needed antibiotics.  She has had ongoing cough and is concerned about pneumonia.  She also has had pain in her right upper back radiating to her right chest and the midline chest area.  She describes it as a knifelike pain that is worse when she breathes or coughs.  She does have some intermittent shortness of breath.  The pains been going on for several weeks.  She says nothing really makes it better or worse other than sometimes when she takes a deep breath.  It is nonexertional.  Not related to eating.  No leg swelling.  She has had some nausea but no vomiting.       Home Medications Prior to Admission medications   Medication Sig Start Date End Date Taking? Authorizing Provider  clotrimazole (LOTRIMIN) 1 % cream Apply to affected area 2 times daily 03/06/22  Yes Malvin Johns, MD  ondansetron (ZOFRAN-ODT) 4 MG disintegrating tablet 43m ODT q4 hours prn nausea/vomit 03/06/22  Yes BMalvin Johns MD  Accu-Chek FastClix Lancets MISC USE 1 LANCET TO CHECK BLOOD SUGAR THREE TIMES DAILY 06/24/21   CPleas Koch NP  ADVIL 200 MG tablet Take 200-600 mg by mouth every 6 (six) hours as needed for mild pain or headache.    [provider]  albuterol (PROAIR HFA) 108 (90 Base) MCG/ACT inhaler Inhale 2 puffs into the lungs every 6 (six) hours as needed for wheezing or shortness of breath. 02/02/22   LVenia Carbon MD  Aspirin-Caffeine (BAYER BACK & BODY PO) Take 1 tablet by mouth daily as needed (body pain).    [provider]   benzonatate (TESSALON) 200 MG capsule Take 1 capsule (200 mg total) by mouth 3 (three) times daily as needed for cough. 02/02/22   LVenia Carbon MD  Blood Glucose Monitoring Suppl (ACCU-CHEK GUIDE) w/Device KIT by Does not apply route.    [provider]  Cholecalciferol (VITAMIN D3) 1.25 MG (50000 UT) CAPS Take 1 capsule by mouth once a week 02/06/22   CPleas Koch NP  docusate sodium (COLACE) 100 MG capsule Take 1 capsule (100 mg total) by mouth daily as needed for up to 30 doses. 05/16/21   GJanith Lima MD  esomeprazole (NEXIUM) 40 MG capsule TAKE 1 CAPSULE BY MOUTH DAILY Patient taking differently: Take 40 mg by mouth daily as needed (heartburn). 02/24/16   CPleas Koch NP  FLUoxetine (PROZAC) 40 MG capsule TAKE 1 CAPSULE BY MOUTH ONCE DAILY FOR ANXIETY Patient taking differently: Take 40 mg by mouth daily. 07/09/21   CPleas Koch NP  glucose blood (ACCU-CHEK GUIDE) test strip USE ONE STRIP THREE TIMES DAILY 08/08/21   CPleas Koch NP  HYDROcodone-acetaminophen (NORCO/VICODIN) 5-325 MG tablet Take 1 tablet by mouth every 6 (six) hours as needed. 07/21/21   [provider]  hydrOXYzine (ATARAX/VISTARIL) 10 MG tablet Take 1 to 2 tablets by mouth twice daily as needed for anxiety. Patient taking differently: Take 10-20 mg by mouth 2 (two) times daily  as needed for anxiety. 07/04/19   [name], NP  Lancets Misc. (ACCU-CHEK FASTCLIX LANCET) KIT Use as directed to test blood sugar daily 10/03/19   [name], NP  levothyroxine (SYNTHROID) 75 MCG tablet Take 1 tablet by mouth every morning on an empty stomach with water only.  No food or other medications for 30 minutes. 09/28/21   [name], NP  linaclotide [facility]) 145 MCG CAPS capsule Take 1 capsule (145 mcg total) by mouth at bedtime. 02/02/22   Mauri Pole, MD  metoprolol tartrate (LOPRESSOR) 25 MG tablet TAKE 1 TABLET BY MOUTH TWICE DAILY . 10/06/21   Leonie Man,  MD  oxybutynin (DITROPAN-XL) 5 MG 24 hr tablet Take 5 mg by mouth daily. 12/03/17   [provider]  rosuvastatin (CRESTOR) 20 MG tablet Take 1 tablet (20 mg total) by mouth daily. 10/06/21   Leonie Man, MD  tamsulosin (FLOMAX) 0.4 MG CAPS capsule Take 1 capsule (0.4 mg total) by mouth daily. 01/23/22   [name], NP  traMADol (ULTRAM) 50 MG tablet Take 50 mg by mouth every 4 (four) hours as needed. 09/05/21   [provider]      Allergies    Latex and Penicillins    Review of Systems   Review of Systems  Constitutional:  Negative for chills, diaphoresis, fatigue and fever.  HENT:  Negative for congestion, rhinorrhea and sneezing.   Eyes: Negative.   Respiratory:  Positive for shortness of breath. Negative for cough and chest tightness.   Cardiovascular:  Positive for chest pain. Negative for leg swelling.  Gastrointestinal:  Positive for nausea. Negative for abdominal pain, blood in stool, diarrhea and vomiting.  Genitourinary:  Negative for difficulty urinating, flank pain, frequency and hematuria.  Musculoskeletal:  Positive for back pain. Negative for arthralgias.  Skin:  Negative for rash.  Neurological:  Negative for dizziness, speech difficulty, weakness, numbness and headaches.    Physical Exam Updated Vital Signs BP 130/75   Pulse 65   Temp 98 F (36.7 C)   Resp 16   Ht _0  (1.575 m)   Wt 79.4 kg   LMP 08/12/2015 Comment: patient states hx. tubal ligation  SpO2 97%   BMI 32.01 kg/m  Physical Exam Constitutional:      Appearance: She is well-developed.  HENT:     Head: Normocephalic and atraumatic.  Eyes:     Pupils: Pupils are equal, round, and reactive to light.  Cardiovascular:     Rate and Rhythm: Normal rate and regular rhythm.     Heart sounds: Normal heart sounds.  Pulmonary:     Effort: Pulmonary effort is normal. No respiratory distress.     Breath sounds: Normal breath sounds. No wheezing or rales.  Chest:     Chest  wall: No tenderness.  Abdominal:     General: Bowel sounds are normal.     Palpations: Abdomen is soft.     Tenderness: There is no abdominal tenderness. There is no guarding or rebound.  Musculoskeletal:        General: Normal range of motion.     Cervical back: Normal range of motion and neck supple.     Comments: No edema or calf tenderness  Lymphadenopathy:     Cervical: No cervical adenopathy.  Skin:    General: Skin is warm and dry.     Findings: No rash.     Comments: Mild rash under her left breast which she  describes as itchy and looks like a candidal infection.  Neurological:     Mental Status: She is alert and oriented to person, place, and time.     ED Results / Procedures / Treatments   Labs (all labs ordered are listed, but only abnormal results are displayed) Labs Reviewed  BASIC METABOLIC PANEL  CBC  I-STAT BETA HCG BLOOD, ED ([facility], [facility], [facility] ONLY)  TROPONIN I (HIGH SENSITIVITY)  TROPONIN I (HIGH SENSITIVITY)    EKG EKG Interpretation  Date/Time:  Friday March 06 2022 17:09:30 EST Ventricular Rate:  67 PR Interval:  144 QRS Duration: 64 QT Interval:  406 QTC Calculation: 429 R Axis:   -13 Text Interpretation: Normal sinus rhythm Cannot rule out Anterior infarct , age undetermined Abnormal ECG When compared with ECG of 18-Jul-2021 02:16, PREVIOUS ECG IS PRESENT Since last tracing No significant change was found Reconfirmed by Malvin Johns [phone]) on 03/06/2022 9:00:43 PM  Radiology CT Angio Chest PE W/Cm &/Or Wo Cm  Result Date: 03/06/2022 CLINICAL DATA:  Pulmonary embolism (PE) suspected, high prob. CHIEF COMPLAINTS Chest Pain CT ANGIO CHEST PE W/CM AND/OR WO CM Pulmonary embolism (PE) suspected, high prob right side chest EXAM: CT ANGIOGRAPHY CHEST WITH CONTRAST TECHNIQUE: Multidetector CT imaging of the chest was performed using the standard protocol during bolus administration of intravenous contrast. Multiplanar CT image reconstructions and MIPs were  obtained to evaluate the vascular anatomy. RADIATION DOSE REDUCTION: This exam was performed according to the departmental dose-optimization program which includes automated exposure control, adjustment of the mA and/or kV according to patient size and/or use of iterative reconstruction technique. CONTRAST:  27m OMNIPAQUE IOHEXOL 350 MG/ML SOLN COMPARISON:  Chest x-ray 03/06/2022, CT angio chest FINDINGS: Cardiovascular: Satisfactory opacification of the pulmonary arteries to the segmental level. No evidence of pulmonary embolism. Normal heart size. No significant pericardial effusion. The thoracic aorta is normal in caliber. Mild atherosclerotic plaque of the thoracic aorta. Left anterior descending coronary artery calcifications. Mediastinum/Nodes: Borderline enlarged bilateral hilar lymph nodes. No enlarged mediastinal, hilar, or axillary lymph nodes. Thyroid gland, trachea, and esophagus demonstrate no significant findings. Tiny hiatal hernia. Lungs/Pleura: Bilateral lobe subsegmental atelectasis. No focal consolidation. Stable partially calcified right upper lobe pulmonary micronodule (7:37). Left upper lobe 4 mm pulmonary micronodule (7:36). Stable calcified right lower lobe pulmonary micronodule. Couple other scattered pulmonary micronodules. No pulmonary mass. No pleural effusion. No pneumothorax. Upper Abdomen: No acute abnormality. Musculoskeletal: No chest wall abnormality. No suspicious lytic or blastic osseous lesions. No acute displaced fracture. Review of the MIP images confirms the above findings. IMPRESSION: 1. No pulmonary embolus. 2. Several calcified and noncalcified scattered pulmonary micronodules. No follow-up needed if patient is low-risk (and has no known or suspected primary neoplasm). Non-contrast chest CT can be considered in 12 months if patient is high-risk. This recommendation follows the consensus statement: Guidelines for Management of Incidental Pulmonary Nodules Detected on CT  Images: From the Fleischner Society 2017; Radiology 2017; 284:228-243. 3. Borderline enlarged bilateral hilar lymph nodes. Recommend attention on follow-up. 4. Tiny hiatal hernia. 5.  Aortic Atherosclerosis (ICD10-I70.0). Electronically Signed   By: MIven FinnM.D.   On: 03/06/2022 23:29   DG Chest 2 View  Result Date: 03/06/2022 CLINICAL DATA:  chest pain EXAM: CHEST - 2 VIEW COMPARISON:  07/18/2021 FINDINGS: Cardiac silhouette is unremarkable. No pneumothorax or pleural effusion. The lungs are clear. The visualized skeletal structures are unremarkable. IMPRESSION: No acute cardiopulmonary process. Electronically Signed   By: JSammie BenchM.D.   On:  03/06/2022 18:31    Procedures Procedures    Medications Ordered in ED Medications  ketorolac (TORADOL) 15 MG/ML injection 15 mg (15 mg Intravenous Given 03/06/22 2136)  iohexol (OMNIPAQUE) 350 MG/ML injection 50 mL (50 mLs Intravenous Contrast Given 03/06/22 2314)    ED Course/ Medical Decision Making/ A&P                           Medical Decision Making Amount and/or Complexity of Data Reviewed Radiology: ordered.  Risk Prescription drug management.   Patient is a 56 year old female who presents with pleuritic type chest pain on the right side.  Her labs are nonconcerning.  Her EKG does not show any ischemic changes.  She has had 2 negative troponins.  No other symptoms that sound more concerning for ACS.  Chest x-ray two-view showed no acute abnormalities.  This was interpreted by me and confirmed by the radiologist.  No evidence of pneumothorax or pneumonia.  She is afebrile.  She is otherwise well-appearing.  She does have ongoing pleuritic pain with some shortness of breath.  CTA was performed which shows no evidence of PE.  There are some small pulmonary nodules.  She does not seem to have any other risk factors for cancer.  I discussed these with her and advised to follow-up with her primary care doctor.  She was discharged  home in good condition.  She likely has pleurisy.  She was encouraged to follow-up with her cardiologist or primary care doctor.  She also has a small area of what appears to be a yeast infection under her left breast.  Was given a prescription for clotrimazole cream.  Return precautions were given.  Final Clinical Impression(s) / ED Diagnoses Final diagnoses:  Pleurisy  Nonspecific chest pain  Candidal dermatitis  Pulmonary nodule    Rx / DC Orders ED Discharge Orders          Ordered    clotrimazole (LOTRIMIN) 1 % cream        03/06/22 2338    ondansetron (ZOFRAN-ODT) 4 MG disintegrating tablet        03/06/22 2338              Malvin Johns, MD 03/06/22 2341

## 2022-03-06 NOTE — ED Provider Triage Note (Signed)
Emergency Medicine Provider Triage Evaluation Note  Abigail [name] , a 57 y.o. female  was evaluated in triage.  Pt complains of chest pain. States that right after Thanksgiving she developed a dry cough that has been persistent since onset.  States that last Friday she started having sharp stabbing chest pain located on the left side of her chest that radiates to her back.  Denies any shortness of breath.  Endorses some nausea and diaphoresis.  Called her cardiologist who recommended she come here for evaluation.  Review of Systems  Positive:  Negative:   Physical Exam  BP 110/70   Pulse 64   Temp 98.3 F (36.8 C)   Resp 17   Ht 5' 2"  (1.575 m)   Wt 79.4 kg   LMP 08/12/2015 Comment: patient states hx. tubal ligation  SpO2 93%   BMI 32.01 kg/m  Gen:   Awake, no distress   Resp:  Normal effort  MSK:   Moves extremities without difficulty  Other:    Medical Decision Making  Medically screening exam initiated at 5:25 PM.  Appropriate orders placed.  Abigail [name] was informed that the remainder of the evaluation will be completed by another provider, this initial triage assessment does not replace that evaluation, and the importance of remaining in the ED until their evaluation is complete.     Abigail Face, PA-C 03/06/22 1726

## 2022-03-06 NOTE — Telephone Encounter (Signed)
Pt c/o of Chest Pain: STAT if CP now or developed within 24 hours  1. Are you having CP right now? Yes  (feels like she has a knife in her chest)   2. Are you experiencing any other symptoms (ex. SOB, nausea, vomiting, sweating)? Little SOB and fatigue   3. How long have you been experiencing CP? 02/27/22   4. Is your CP continuous or coming and going? Continuous   5. Have you taken Nitroglycerin? No  ?

## 2022-03-13 ENCOUNTER — Ambulatory Visit: Payer: 59 | Admitting: Primary Care

## 2022-03-13 ENCOUNTER — Encounter: Payer: Self-pay | Admitting: Primary Care

## 2022-03-13 VITALS — BP 102/70 | HR 62 | Temp 97.3°F | Ht 62.0 in | Wt 180.0 lb

## 2022-03-13 DIAGNOSIS — R091 Pleurisy: Secondary | ICD-10-CM

## 2022-03-13 DIAGNOSIS — R918 Other nonspecific abnormal finding of lung field: Secondary | ICD-10-CM | POA: Diagnosis not present

## 2022-03-13 MED ORDER — KETOROLAC TROMETHAMINE 60 MG/2ML IM SOLN
60.0000 mg | Freq: Once | INTRAMUSCULAR | Status: AC
  Administered 2022-03-13: 60 mg via INTRAMUSCULAR

## 2022-03-13 NOTE — Progress Notes (Signed)
Subjective:    Patient ID: Abigail [name], female    DOB: 1965/04/24, 57 y.o.   MRN: [id number]  Cough Pertinent negatives include no shortness of breath.    Abigail [name] is a very pleasant 57 y.o. female  has a past medical history of Anxiety, Bulging lumbar disc, CAP (community acquired pneumonia), Cardiac arrhythmia due to congenital heart disease, Celiac disease, Depression, Diverticulosis, Fatty liver (05/2016), Frequent headaches, GERD (gastroesophageal reflux disease), Heart disease, History of kidney stones, Hypertension, Kidney stones, Migraine, Obesity (BMI 35.0-39.9 without comorbidity), and UTI (urinary tract infection). who presents today to discuss cough.  Symptom onset 02/02/22 with a cough. She then began to notice sore throat, painful swallowing. Evaluated by Dr. Silvio Pate at the time, tested negative for influenza and strep, was treated with albuterol inhaler and Tessalon Perles. Symptoms significantly improved in late December 2023.  Evaluated at [facility] on 03/06/22 for chest pain with pain to the right upper back with radiation to right chest and mid chest area. She underwent ECG which did not show acute process or changes. She underwent chest xray which was negative. She then underwent CTA which was negative for PE but did show several micronodules, and borderline enlarged bilateral hilar lymph nodes. She was discharged home later that evening with recommendations to take Ibuprofen for Pleurisy.   Her pain continues to the right mid back with radiation to anterior chest. She has a mild cough, does notice intermittent coughing spells. She's tried taking hydrocodone, topical OTC agents, Advil, Ibuprofen without improvement.   She passed a kidney stone 2 weeks ago, has noticed left sided flank pain with radiation around to left groin. She has an appointment with Urology in a few weeks. She has plenty of Tamsulosin on hand and will resume.    Review of Systems   Constitutional:  Negative for fatigue.  HENT:  Positive for congestion, sinus pressure and sinus pain.   Respiratory:  Positive for cough. Negative for chest tightness and shortness of breath.   Musculoskeletal:  Positive for back pain.         Past Medical History:  Diagnosis Date   Anxiety    Bulging lumbar disc    CAP (community acquired pneumonia)    Cardiac arrhythmia due to congenital heart disease    no arrhythmia identified    Celiac disease    Depression    Diverticulosis    Sigmoid colon   Fatty liver 05/2016   Frequent headaches    GERD (gastroesophageal reflux disease)    Heart disease    History of kidney stones    Hypertension    Kidney stones    Migraine    Obesity (BMI 35.0-39.9 without comorbidity)    UTI (urinary tract infection)     Social History   Socioeconomic History   Marital status: Single    Spouse name: Not on file   Number of children: Not on file   Years of education: Not on file   Highest education level: Not on file  Occupational History   Not on file  Tobacco Use   Smoking status: Former    Types: Cigarettes    Quit date: 2008    Years since quitting: 16.0   Smokeless tobacco: Never   Tobacco comments:    used tobacco "socially"  Scientific laboratory technician Use: Never used  Substance and Sexual Activity   Alcohol use: No    Alcohol/week: 0.0 standard drinks of alcohol   Drug use:  No   Sexual activity: Not on file  Other Topics Concern   Not on file  Social History Narrative   Single.   Has a set of Twins, age 57.   Works for the [employer] and [employer] and [employer]   Enjoys relaxing, spending time with her children.   Social Determinants of Health   Financial Resource Strain: Not on file  Food Insecurity: Not on file  Transportation Needs: Not on file  Physical Activity: Not on file  Stress: Not on file  Social Connections: Not on file  Intimate Partner Violence: Not on file    Past Surgical History:   Procedure Laterality Date   [place]   COLONOSCOPY  06/2016   CYSTOSCOPY/URETEROSCOPY/HOLMIUM LASER/STENT PLACEMENT Right 05/16/2021   Procedure: CYSTOSCOPY/URETEROSCOPY/HOLMIUM LASER/STENT PLACEMENT;  Surgeon: Janith Lima, MD;  Location: [facility];  Service: Urology;  Laterality: Right;   ESOPHAGEAL MANOMETRY N/A 12/06/2019   Procedure: ESOPHAGEAL MANOMETRY (EM);  Surgeon: Mauri Pole, MD;  Location: [facility];  Service: Endoscopy;  Laterality: N/A;   EXTRACORPOREAL SHOCK WAVE LITHOTRIPSY Left 12/03/2016   Procedure: LEFT EXTRACORPOREAL SHOCK WAVE LITHOTRIPSY (ESWL);  Surgeon: Irine Seal, MD;  Location: [facility];  Service: Urology;  Laterality: Left;   KIDNEY STONE SURGERY     LEFT HEART CATH AND CORONARY ANGIOGRAPHY  07/28/2016    Large tortuous coronary arteries no radiographic evidence of disease.  Not anginal chest pain.  Normal EF.   LITHOTRIPSY     NASAL SINUS SURGERY     ROTATOR CUFF REPAIR Right 12/27/2017   TRANSTHORACIC ECHOCARDIOGRAM  06/2017   EF 60-65%.  GR 1-2 DD.  Otherwise normal echo.  Normal valve function.  Normal wall motion.   TUBAL LIGATION      Family History  Problem Relation Age of Onset   Gallstones Mother    COPD Father    Emphysema Father    Colon cancer Neg Hx    Stomach cancer Neg Hx    Esophageal cancer Neg Hx    Pancreatic cancer Neg Hx    Liver disease Neg Hx     Allergies  Allergen Reactions   Latex Rash   Penicillins Nausea Only    Has patient had a PCN reaction causing immediate rash, facial/tongue/throat swelling, SOB or lightheadedness with hypotension: No Has patient had a PCN reaction causing severe rash involving mucus membranes or skin necrosis: No Has patient had a PCN reaction that required hospitalization: No Has patient had a PCN reaction occurring within the last 10 years: No If all of the above answers are "NO", then may proceed with Cephalosporin use.     Current Outpatient Medications on  File Prior to Visit  Medication Sig Dispense Refill   Accu-Chek FastClix Lancets MISC USE 1 LANCET TO CHECK BLOOD SUGAR THREE TIMES DAILY 300 each 1   ADVIL 200 MG tablet Take 200-600 mg by mouth every 6 (six) hours as needed for mild pain or headache.     albuterol (PROAIR HFA) 108 (90 Base) MCG/ACT inhaler Inhale 2 puffs into the lungs every 6 (six) hours as needed for wheezing or shortness of breath. 8 g 0   Aspirin-Caffeine (BAYER BACK & BODY PO) Take 1 tablet by mouth daily as needed (body pain).     benzonatate (TESSALON) 200 MG capsule Take 1 capsule (200 mg total) by mouth 3 (three) times daily as needed for cough. 60 capsule 0   Blood Glucose Monitoring Suppl (ACCU-CHEK  GUIDE) w/Device KIT by Does not apply route.     Cholecalciferol (VITAMIN D3) 1.25 MG (50000 UT) CAPS Take 1 capsule by mouth once a week 12 capsule 0   esomeprazole (NEXIUM) 40 MG capsule TAKE 1 CAPSULE BY MOUTH DAILY (Patient taking differently: Take 40 mg by mouth daily as needed (heartburn).) 90 capsule 1   FLUoxetine (PROZAC) 40 MG capsule TAKE 1 CAPSULE BY MOUTH ONCE DAILY FOR ANXIETY (Patient taking differently: Take 40 mg by mouth daily.) 90 capsule 2   glucose blood (ACCU-CHEK GUIDE) test strip USE ONE STRIP THREE TIMES DAILY 300 each 1   HYDROcodone-acetaminophen (NORCO/VICODIN) 5-325 MG tablet Take 1 tablet by mouth every 6 (six) hours as needed.     hydrOXYzine (ATARAX/VISTARIL) 10 MG tablet Take 1 to 2 tablets by mouth twice daily as needed for anxiety. (Patient taking differently: Take 10-20 mg by mouth 2 (two) times daily as needed for anxiety.) 30 tablet 0   Lancets Misc. (ACCU-CHEK FASTCLIX LANCET) KIT Use as directed to test blood sugar daily 1 kit 0   levothyroxine (SYNTHROID) 75 MCG tablet Take 1 tablet by mouth every morning on an empty stomach with water only.  No food or other medications for 30 minutes. 90 tablet 1   linaclotide (LINZESS) 145 MCG CAPS capsule Take 1 capsule (145 mcg total) by mouth at  bedtime. 30 capsule 3   metoprolol tartrate (LOPRESSOR) 25 MG tablet TAKE 1 TABLET BY MOUTH TWICE DAILY . 180 tablet 3   ondansetron (ZOFRAN-ODT) 4 MG disintegrating tablet '4mg'$  ODT q4 hours prn nausea/vomit 6 tablet 0   oxybutynin (DITROPAN-XL) 5 MG 24 hr tablet Take 5 mg by mouth daily.  1   rosuvastatin (CRESTOR) 20 MG tablet Take 1 tablet (20 mg total) by mouth daily. 90 tablet 3   tamsulosin (FLOMAX) 0.4 MG CAPS capsule Take 1 capsule (0.4 mg total) by mouth daily. 60 capsule 0   traMADol (ULTRAM) 50 MG tablet Take 50 mg by mouth every 4 (four) hours as needed.     clotrimazole (LOTRIMIN) 1 % cream Apply to affected area 2 times daily (Patient not taking: Reported on 03/13/2022) 15 g 0   docusate sodium (COLACE) 100 MG capsule Take 1 capsule (100 mg total) by mouth daily as needed for up to 30 doses. (Patient not taking: Reported on 03/13/2022) 30 capsule 0   No current facility-administered medications on file prior to visit.    BP 102/70   Pulse 62   Temp (!) 97.3 F (36.3 C) (Temporal)   Ht '5\' 2"'$  (1.575 m)   Wt 180 lb (81.6 kg)   LMP 08/12/2015 Comment: patient states hx. tubal ligation  SpO2 99%   BMI 32.92 kg/m  Objective:   Physical Exam HENT:     Right Ear: Tympanic membrane and ear canal normal.     Left Ear: Tympanic membrane and ear canal normal.     Nose:     Right Sinus: No maxillary sinus tenderness or frontal sinus tenderness.     Left Sinus: No maxillary sinus tenderness or frontal sinus tenderness.     Mouth/Throat:     Pharynx: No posterior oropharyngeal erythema.  Eyes:     Conjunctiva/sclera: Conjunctivae normal.  Cardiovascular:     Rate and Rhythm: Normal rate and regular rhythm.  Pulmonary:     Effort: Pulmonary effort is normal.     Breath sounds: Normal breath sounds. No wheezing or rales.  Chest:     Chest wall: Tenderness  present.       Comments: No rash. Musculoskeletal:     Cervical back: Neck supple.     Thoracic back: Tenderness present.  Normal range of motion.       Back:  Lymphadenopathy:     Cervical: No cervical adenopathy.  Skin:    General: Skin is warm and dry.     Findings: No rash.           Assessment & Plan:  Pleurisy without effusion Assessment & Plan: Reviewed ED notes, labs, imaging.  Discussed pleurisy and typical pace of condition.  Increase Ibuprofen 600 mg TID continuously. Offered prednisone prescription for which she kindly declines.   IM Toradol 60 mg provided today.  No signs of infection.    Pulmonary nodules Assessment & Plan: Reviewed CT angio chest from December 2023. Given history of tobacco use we will repeat with CT chest in December 2024.  She agrees.         [name], NP

## 2022-03-13 NOTE — Assessment & Plan Note (Signed)
Reviewed CT angio chest from December 2023. Given history of tobacco use we will repeat with CT chest in December 2024.  She agrees.

## 2022-03-13 NOTE — Addendum Note (Signed)
Addended by: Pat Kocher on: 03/13/2022 11:04 AM   Modules accepted: Orders

## 2022-03-13 NOTE — Patient Instructions (Signed)
Start Ibuprofen 600 mg every 8 hours for your symptoms.  Gradually increase your activity level.   It was a pleasure to see you today!

## 2022-03-13 NOTE — Assessment & Plan Note (Signed)
Reviewed ED notes, labs, imaging.  Discussed pleurisy and typical pace of condition.  Increase Ibuprofen 600 mg TID continuously. Offered prednisone prescription for which she kindly declines.   IM Toradol 60 mg provided today.  No signs of infection.

## 2022-03-26 ENCOUNTER — Ambulatory Visit: Payer: 59 | Admitting: Primary Care

## 2022-03-31 ENCOUNTER — Other Ambulatory Visit: Payer: Self-pay | Admitting: Primary Care

## 2022-03-31 ENCOUNTER — Ambulatory Visit: Payer: 59 | Admitting: Primary Care

## 2022-03-31 DIAGNOSIS — E039 Hypothyroidism, unspecified: Secondary | ICD-10-CM

## 2022-04-11 ENCOUNTER — Other Ambulatory Visit: Payer: Self-pay | Admitting: Urology

## 2022-04-13 ENCOUNTER — Other Ambulatory Visit: Payer: Self-pay | Admitting: Primary Care

## 2022-04-13 DIAGNOSIS — F32A Depression, unspecified: Secondary | ICD-10-CM

## 2022-04-16 ENCOUNTER — Other Ambulatory Visit: Payer: Self-pay | Admitting: Primary Care

## 2022-04-16 DIAGNOSIS — R109 Unspecified abdominal pain: Secondary | ICD-10-CM

## 2022-05-03 ENCOUNTER — Other Ambulatory Visit: Payer: Self-pay | Admitting: Primary Care

## 2022-05-03 DIAGNOSIS — E559 Vitamin D deficiency, unspecified: Secondary | ICD-10-CM

## 2022-05-17 ENCOUNTER — Other Ambulatory Visit: Payer: Self-pay | Admitting: Urology

## 2022-05-17 ENCOUNTER — Other Ambulatory Visit: Payer: Self-pay | Admitting: Gastroenterology

## 2022-05-29 ENCOUNTER — Encounter: Payer: Self-pay | Admitting: Primary Care

## 2022-05-29 ENCOUNTER — Ambulatory Visit (INDEPENDENT_AMBULATORY_CARE_PROVIDER_SITE_OTHER): Payer: 59 | Admitting: Primary Care

## 2022-05-29 VITALS — BP 112/68 | HR 65 | Temp 98.2°F | Ht 62.0 in | Wt 186.0 lb

## 2022-05-29 DIAGNOSIS — M545 Low back pain, unspecified: Secondary | ICD-10-CM

## 2022-05-29 DIAGNOSIS — E785 Hyperlipidemia, unspecified: Secondary | ICD-10-CM

## 2022-05-29 DIAGNOSIS — E1165 Type 2 diabetes mellitus with hyperglycemia: Secondary | ICD-10-CM

## 2022-05-29 DIAGNOSIS — R109 Unspecified abdominal pain: Secondary | ICD-10-CM

## 2022-05-29 DIAGNOSIS — E039 Hypothyroidism, unspecified: Secondary | ICD-10-CM

## 2022-05-29 DIAGNOSIS — R002 Palpitations: Secondary | ICD-10-CM

## 2022-05-29 DIAGNOSIS — F419 Anxiety disorder, unspecified: Secondary | ICD-10-CM

## 2022-05-29 DIAGNOSIS — Z1211 Encounter for screening for malignant neoplasm of colon: Secondary | ICD-10-CM

## 2022-05-29 DIAGNOSIS — Z0000 Encounter for general adult medical examination without abnormal findings: Secondary | ICD-10-CM | POA: Diagnosis not present

## 2022-05-29 DIAGNOSIS — E559 Vitamin D deficiency, unspecified: Secondary | ICD-10-CM

## 2022-05-29 DIAGNOSIS — K219 Gastro-esophageal reflux disease without esophagitis: Secondary | ICD-10-CM | POA: Diagnosis not present

## 2022-05-29 DIAGNOSIS — N3281 Overactive bladder: Secondary | ICD-10-CM

## 2022-05-29 DIAGNOSIS — K5909 Other constipation: Secondary | ICD-10-CM | POA: Diagnosis not present

## 2022-05-29 DIAGNOSIS — E1169 Type 2 diabetes mellitus with other specified complication: Secondary | ICD-10-CM

## 2022-05-29 DIAGNOSIS — R29818 Other symptoms and signs involving the nervous system: Secondary | ICD-10-CM

## 2022-05-29 DIAGNOSIS — G8929 Other chronic pain: Secondary | ICD-10-CM

## 2022-05-29 DIAGNOSIS — F32A Anxiety disorder, unspecified: Secondary | ICD-10-CM

## 2022-05-29 NOTE — Assessment & Plan Note (Signed)
Continued.  Following with Urology.  Renal US ordered and pending.  Continue Flomax 0.4 mg daily.

## 2022-05-29 NOTE — Progress Notes (Signed)
Subjective:    Patient ID: Abigail [name], female    DOB: 06-04-1965, 57 y.o.   MRN: [id number]  HPI  Abigail [name] is a very pleasant 57 y.o. female who presents today for complete physical and follow up of chronic conditions.  Immunizations: -Tetanus: Completed in 2023 -Influenza: Completed this season -Shingles: Never completed, declines    Diet: Fair diet.  Exercise: No regular exercise.  Eye exam: Completes annually  Dental exam: Completes semi-annually    Pap Smear: 2022 Mammogram: October 2023  Colonoscopy: Completed in 2018, due 2023.    BP Readings from Last 3 Encounters:  05/29/22 112/68  03/13/22 102/70  03/06/22 121/70       Review of Systems  Constitutional:  Negative for unexpected weight change.  HENT:  Negative for rhinorrhea.   Respiratory:  Negative for cough and shortness of breath.   Cardiovascular:  Negative for chest pain.  Gastrointestinal:  Negative for constipation and diarrhea.  Genitourinary:  Positive for flank pain. Negative for difficulty urinating.  Musculoskeletal:  Positive for arthralgias and back pain.  Skin:  Negative for rash.  Allergic/Immunologic: Negative for environmental allergies.  Neurological:  Negative for dizziness and headaches.  Psychiatric/Behavioral:  The patient is nervous/anxious.          Past Medical History:  Diagnosis Date   Anxiety    Bulging lumbar disc    CAP (community acquired pneumonia)    Cardiac arrhythmia due to congenital heart disease    no arrhythmia identified    Celiac disease    Depression    Diverticulosis    Sigmoid colon   Fatty liver 05/2016   Frequent headaches    GERD (gastroesophageal reflux disease)    Heart disease    History of kidney stones    Hypertension    Kidney stones    Migraine    Obesity (BMI 35.0-39.9 without comorbidity)    UTI (urinary tract infection)     Social History   Socioeconomic History   Marital status: Single    Spouse  name: Not on file   Number of children: Not on file   Years of education: Not on file   Highest education level: Not on file  Occupational History   Not on file  Tobacco Use   Smoking status: Former    Types: Cigarettes    Quit date: 2008    Years since quitting: 16.2   Smokeless tobacco: Never   Tobacco comments:    used tobacco "socially"  Scientific laboratory technician Use: Never used  Substance and Sexual Activity   Alcohol use: No    Alcohol/week: 0.0 standard drinks of alcohol   Drug use: No   Sexual activity: Not on file  Other Topics Concern   Not on file  Social History Narrative   Single.   Has a set of Twins, age 16.   Works for the [employer] and [employer] and [employer]   Enjoys relaxing, spending time with her children.   Social Determinants of Health   Financial Resource Strain: Not on file  Food Insecurity: Not on file  Transportation Needs: Not on file  Physical Activity: Not on file  Stress: Not on file  Social Connections: Not on file  Intimate Partner Violence: Not on file    Past Surgical History:  Procedure Laterality Date   [place]   COLONOSCOPY  06/2016   CYSTOSCOPY/URETEROSCOPY/HOLMIUM LASER/STENT PLACEMENT Right 05/16/2021   Procedure:  CYSTOSCOPY/URETEROSCOPY/HOLMIUM LASER/STENT PLACEMENT;  Surgeon: Janith Lima, MD;  Location: [facility];  Service: Urology;  Laterality: Right;   ESOPHAGEAL MANOMETRY N/A 12/06/2019   Procedure: ESOPHAGEAL MANOMETRY (EM);  Surgeon: Mauri Pole, MD;  [facility];  Service: Endoscopy;  Laterality: N/A;   EXTRACORPOREAL SHOCK WAVE LITHOTRIPSY Left 12/03/2016   Procedure: LEFT EXTRACORPOREAL SHOCK WAVE LITHOTRIPSY (ESWL);  Surgeon: Irine Seal, MD;  Location: [facility];  Service: Urology;  Laterality: Left;   KIDNEY STONE SURGERY     LEFT HEART CATH AND CORONARY ANGIOGRAPHY  07/28/2016    Large tortuous coronary arteries no radiographic evidence of disease.  Not anginal chest  pain.  Normal EF.   LITHOTRIPSY     NASAL SINUS SURGERY     ROTATOR CUFF REPAIR Right 12/27/2017   TRANSTHORACIC ECHOCARDIOGRAM  06/2017   EF 60-65%.  GR 1-2 DD.  Otherwise normal echo.  Normal valve function.  Normal wall motion.   TUBAL LIGATION      Family History  Problem Relation Age of Onset   Gallstones Mother    COPD Father    Emphysema Father    Colon cancer Neg Hx    Stomach cancer Neg Hx    Esophageal cancer Neg Hx    Pancreatic cancer Neg Hx    Liver disease Neg Hx     Allergies  Allergen Reactions   Latex Rash   Penicillins Nausea Only    Has patient had a PCN reaction causing immediate rash, facial/tongue/throat swelling, SOB or lightheadedness with hypotension: No Has patient had a PCN reaction causing severe rash involving mucus membranes or skin necrosis: No Has patient had a PCN reaction that required hospitalization: No Has patient had a PCN reaction occurring within the last 10 years: No If all of the above answers are "NO", then may proceed with Cephalosporin use.     Current Outpatient Medications on File Prior to Visit  Medication Sig Dispense Refill   Accu-Chek FastClix Lancets MISC USE 1 LANCET TO CHECK BLOOD SUGAR THREE TIMES DAILY 300 each 1   ADVIL 200 MG tablet Take 200-600 mg by mouth every 6 (six) hours as needed for mild pain or headache.     albuterol (PROAIR HFA) 108 (90 Base) MCG/ACT inhaler Inhale 2 puffs into the lungs every 6 (six) hours as needed for wheezing or shortness of breath. 8 g 0   Aspirin-Caffeine (BAYER BACK & BODY PO) Take 1 tablet by mouth daily as needed (body pain).     benzonatate (TESSALON) 200 MG capsule Take 1 capsule (200 mg total) by mouth 3 (three) times daily as needed for cough. 60 capsule 0   Blood Glucose Monitoring Suppl (ACCU-CHEK GUIDE) w/Device KIT by Does not apply route.     FLUoxetine (PROZAC) 40 MG capsule TAKE 1 CAPSULE BY MOUTH ONCE DAILY FOR ANXIETY 90 capsule 0   glucose blood (ACCU-CHEK GUIDE) test  strip USE ONE STRIP THREE TIMES DAILY 300 each 1   HYDROcodone-acetaminophen (NORCO/VICODIN) 5-325 MG tablet Take 1 tablet by mouth every 6 (six) hours as needed.     hydrOXYzine (ATARAX/VISTARIL) 10 MG tablet Take 1 to 2 tablets by mouth twice daily as needed for anxiety. (Patient taking differently: Take 10-20 mg by mouth 2 (two) times daily as needed for anxiety.) 30 tablet 0   Lancets Misc. (ACCU-CHEK FASTCLIX LANCET) KIT Use as directed to test blood sugar daily 1 kit 0   levothyroxine (SYNTHROID) 75 MCG tablet TAKE 1 TABLET BY MOUTH IN  THE MORNING ON  AN  EMPTY  STOMACH  WITH  WATER  ONLY  NO  FOOD  OR  OTHER  MEDICATIONS  FOR  30  MINUTES 90 tablet 0   linaclotide (LINZESS) 145 MCG CAPS capsule Take 1 capsule (145 mcg total) by mouth at bedtime. 30 capsule 3   metoprolol tartrate (LOPRESSOR) 25 MG tablet TAKE 1 TABLET BY MOUTH TWICE DAILY . 180 tablet 3   ondansetron (ZOFRAN-ODT) 4 MG disintegrating tablet 4mg  ODT q4 hours prn nausea/vomit 6 tablet 0   oxybutynin (DITROPAN-XL) 10 MG 24 hr tablet Take 10 mg by mouth at bedtime.     rosuvastatin (CRESTOR) 20 MG tablet Take 1 tablet (20 mg total) by mouth daily. 90 tablet 3   tamsulosin (FLOMAX) 0.4 MG CAPS capsule Take 1 capsule (0.4 mg total) by mouth daily. As needed for kidney stone 60 capsule 0   traMADol (ULTRAM) 50 MG tablet Take 50 mg by mouth every 4 (four) hours as needed.     VITAMIN D3 1.25 MG (50000 UT) capsule Take 1 capsule by mouth once a week 12 capsule 0   clotrimazole (LOTRIMIN) 1 % cream Apply to affected area 2 times daily (Patient not taking: Reported on 03/13/2022) 15 g 0   docusate sodium (COLACE) 100 MG capsule Take 1 capsule (100 mg total) by mouth daily as needed for up to 30 doses. (Patient not taking: Reported on 03/13/2022) 30 capsule 0   esomeprazole (NEXIUM) 40 MG capsule TAKE 1 CAPSULE BY MOUTH DAILY (Patient not taking: Reported on 05/29/2022) 90 capsule 1   No current facility-administered medications on file prior  to visit.    BP 112/68   Pulse 65   Temp 98.2 F (36.8 C) (Temporal)   Ht 5\' 2"  (1.575 m)   Wt 186 lb (84.4 kg)   LMP 08/12/2015 Comment: patient states hx. tubal ligation  SpO2 97%   BMI 34.02 kg/m  Objective:   Physical Exam HENT:     Right Ear: Tympanic membrane and ear canal normal.     Left Ear: Tympanic membrane and ear canal normal.     Nose: Nose normal.  Eyes:     Conjunctiva/sclera: Conjunctivae normal.     Pupils: Pupils are equal, round, and reactive to light.  Neck:     Thyroid: No thyromegaly.  Cardiovascular:     Rate and Rhythm: Normal rate and regular rhythm.     Heart sounds: No murmur heard. Pulmonary:     Effort: Pulmonary effort is normal.     Breath sounds: Normal breath sounds. No rales.  Abdominal:     General: Bowel sounds are normal.     Palpations: Abdomen is soft.     Tenderness: There is no abdominal tenderness.  Musculoskeletal:        General: Normal range of motion.     Cervical back: Neck supple.  Lymphadenopathy:     Cervical: No cervical adenopathy.  Skin:    General: Skin is warm and dry.     Findings: No rash.  Neurological:     Mental Status: She is alert and oriented to person, place, and time.     Cranial Nerves: No cranial nerve deficit.     Deep Tendon Reflexes: Reflexes are normal and symmetric.  Psychiatric:        Mood and Affect: Mood normal.           Assessment & Plan:  Chronic constipation Assessment & Plan: Controlled.  Continue  Linzess 145 mcg daily.   Gastroesophageal reflux disease, unspecified whether esophagitis present Assessment & Plan: Controlled.  Continue Nexium 40 mg daily as needed.  She is working on reducing trigger foods.    Anxiety and depression Assessment & Plan: Deteriorated.   Continue fluoxetine 40 mg daily. Continue hydroxyzine 10 mg PRN.  Referral placed to therapy per patient request.   Orders: -     Ambulatory referral to Psychology  Screening for colon  cancer -     Ambulatory referral to Gastroenterology  Controlled type 2 diabetes mellitus with hyperglycemia, without long-term current use of insulin ([place]) Assessment & Plan: Repeat A1C pending.  Continue off medications.   Orders: -     Microalbumin / creatinine urine ratio -     Hemoglobin A1c -     Lipase  Hyperlipidemia associated with type 2 diabetes mellitus ([place]) Assessment & Plan: Repeat lipid panel pending.  Discussed the importance of a healthy diet and regular exercise in order for weight loss, and to reduce the risk of further co-morbidity. Continue rosuvastatin 20 mg daily.  Orders: -     Lipid panel -     Comprehensive metabolic panel  Hypothyroidism, unspecified type Assessment & Plan: She seems to be taking levothyroxine correctly.  Continue levothyroxine 75 mcg daily.  Repeat TSH pending.  Orders: -     TSH  Overactive bladder Assessment & Plan: Following with Urology. Continue oxybutynin XL 10 mg daily.   Chronic low back pain, unspecified back pain laterality, unspecified whether sciatica present Assessment & Plan: Following with spine specialist.  Continue Norco 5-325 mg daily PRN.      Flank pain Assessment & Plan: Continued.  Following with Urology.  Renal US ordered and pending.  Continue Flomax 0.4 mg daily.   Palpitations Assessment & Plan: Controlled.  Continue metoprolol 25 mg BID.   Suspected sleep apnea Assessment & Plan: Has not followed through for CPAP machine.   She will get in touch with pulmonology to obtain CPAP machine.  Orders: -     Ambulatory referral to Pulmonology  Vitamin D deficiency Assessment & Plan: Repeat vitamin D level pending. Continue vitamin D 50,000 IU daily  Orders: -     VITAMIN D 25 Hydroxy (Vit-D Deficiency, Fractures)  Preventative health care Assessment & Plan: Declines Shingrix vaccines. Pap smear UTD. Mammogram UTD Colonoscopy due, referral placed to GI  Discussed  the importance of a healthy diet and regular exercise in order for weight loss, and to reduce the risk of further co-morbidity.  Exam stable. Labs pending.  Follow up in 1 year for repeat physical.          [name], NP

## 2022-05-29 NOTE — Assessment & Plan Note (Signed)
Repeat A1C pending.  Continue off medications.

## 2022-05-29 NOTE — Assessment & Plan Note (Signed)
Following with spine specialist.  Continue Norco 5-325 mg daily PRN.

## 2022-05-29 NOTE — Patient Instructions (Addendum)
Stop by the lab prior to leaving today. I will notify you of your results once received.   You will either be contacted via phone regarding your referral to pulmonology, GI for the colonoscopy, and therapy, or you may receive a letter on your MyChart portal from our referral team with instructions for scheduling an appointment. Please let us know if you have not been contacted by anyone within two weeks.  It was a pleasure to see you today!

## 2022-05-29 NOTE — Assessment & Plan Note (Signed)
She seems to be taking levothyroxine correctly.  Continue levothyroxine 75 mcg daily.  Repeat TSH pending.

## 2022-05-29 NOTE — Assessment & Plan Note (Signed)
Following with Urology. Continue oxybutynin XL 10 mg daily.

## 2022-05-29 NOTE — Assessment & Plan Note (Signed)
Repeat vitamin D level pending. Continue vitamin D 50,000 IU daily

## 2022-05-29 NOTE — Assessment & Plan Note (Addendum)
Deteriorated.   Continue fluoxetine 40 mg daily. Continue hydroxyzine 10 mg PRN.  Referral placed to therapy per patient request.

## 2022-05-29 NOTE — Assessment & Plan Note (Signed)
Declines Shingrix vaccines. Pap smear UTD. Mammogram UTD Colonoscopy due, referral placed to GI  Discussed the importance of a healthy diet and regular exercise in order for weight loss, and to reduce the risk of further co-morbidity.  Exam stable. Labs pending.  Follow up in 1 year for repeat physical.

## 2022-05-29 NOTE — Assessment & Plan Note (Signed)
Has not followed through for CPAP machine.   She will get in touch with pulmonology to obtain CPAP machine.

## 2022-05-29 NOTE — Assessment & Plan Note (Signed)
Controlled.  Continue Linzess 145 mcg daily.

## 2022-05-29 NOTE — Assessment & Plan Note (Signed)
Controlled.  Continue metoprolol 25 mg BID.

## 2022-05-29 NOTE — Assessment & Plan Note (Signed)
Repeat lipid panel pending.  Discussed the importance of a healthy diet and regular exercise in order for weight loss, and to reduce the risk of further co-morbidity. Continue rosuvastatin 20 mg daily.  

## 2022-05-29 NOTE — Assessment & Plan Note (Signed)
Controlled.  Continue Nexium 40 mg daily as needed.  She is working on reducing trigger foods.

## 2022-05-30 LAB — LIPID PANEL
Cholesterol: 132 mg/dL (ref <200)
HDL: 41 mg/dL — ABNORMAL LOW (ref >=50)
LDL Cholesterol (Calc): 68 mg/dL (calc)
Non-HDL Cholesterol (Calc): 91 mg/dL (calc) (ref <130)
Total CHOL/HDL Ratio: 3.2 (calc) (ref <5.0)
Triglycerides: 145 mg/dL (ref <150)

## 2022-05-30 LAB — COMPREHENSIVE METABOLIC PANEL
AG Ratio: 2 (calc) (ref 1.0–2.5)
ALT: 30 U/L — ABNORMAL HIGH (ref 6–29)
AST: 22 U/L (ref 10–35)
Albumin: 4.3 g/dL (ref 3.6–5.1)
Alkaline phosphatase (APISO): 102 U/L (ref 37–153)
BUN: 12 mg/dL (ref 7–25)
CO2: 27 mmol/L (ref 20–32)
Calcium: 9.3 mg/dL (ref 8.6–10.4)
Chloride: 103 mmol/L (ref 98–110)
Creat: 0.66 mg/dL (ref 0.50–1.03)
Globulin: 2.2 g/dL (calc) (ref 1.9–3.7)
Glucose, Bld: 79 mg/dL (ref 65–99)
Potassium: 4.6 mmol/L (ref 3.5–5.3)
Sodium: 140 mmol/L (ref 135–146)
Total Bilirubin: 1.2 mg/dL (ref 0.2–1.2)
Total Protein: 6.5 g/dL (ref 6.1–8.1)

## 2022-05-30 LAB — LIPASE: Lipase: 23 U/L (ref 7–60)

## 2022-05-30 LAB — HEMOGLOBIN A1C
Hgb A1c MFr Bld: 5.7 % of total Hgb — ABNORMAL HIGH (ref <5.7)
Mean Plasma Glucose: 117 mg/dL
eAG (mmol/L): 6.5 mmol/L

## 2022-05-30 LAB — MICROALBUMIN / CREATININE URINE RATIO
Creatinine, Urine: 117 mg/dL (ref 20–275)
Microalb Creat Ratio: 9 mg/g creat (ref <30)
Microalb, Ur: 1 mg/dL

## 2022-05-30 LAB — TSH: TSH: 1.86 mIU/L (ref 0.40–4.50)

## 2022-05-30 LAB — VITAMIN D 25 HYDROXY (VIT D DEFICIENCY, FRACTURES): Vit D, 25-Hydroxy: 84 ng/mL (ref 30–100)

## 2022-06-01 ENCOUNTER — Other Ambulatory Visit: Payer: Self-pay | Admitting: Gastroenterology

## 2022-06-01 ENCOUNTER — Encounter: Payer: Self-pay | Admitting: Gastroenterology

## 2022-06-16 ENCOUNTER — Encounter: Payer: Self-pay | Admitting: Gastroenterology

## 2022-06-16 ENCOUNTER — Ambulatory Visit (AMBULATORY_SURGERY_CENTER): Payer: 59 | Admitting: *Deleted

## 2022-06-16 VITALS — Ht 62.0 in | Wt 180.0 lb

## 2022-06-16 DIAGNOSIS — K900 Celiac disease: Secondary | ICD-10-CM | POA: Insufficient documentation

## 2022-06-16 DIAGNOSIS — Z86010 Personal history of colonic polyps: Secondary | ICD-10-CM

## 2022-06-16 MED ORDER — NA SULFATE-K SULFATE-MG SULF 17.5-3.13-1.6 GM/177ML PO SOLN: 1.0000 | Freq: Once | ORAL | 0 refills | Status: AC

## 2022-06-16 NOTE — Progress Notes (Signed)
Pt's pre-visit is done over the phone and all paperwork (prep instructions) sent to patient. Pt's name and DOB verified at the beginning of the pre-visit. Pt denies any difficulty with ambulating.  No egg or soy allergy known to patient  No issues known to pt with past sedation with any surgeries or procedures Pt denies having issues being intubated Pt has no issues moving head neck or swallowing No FH of Malignant Hyperthermia Pt is not on diet pills Pt is not on home 02  Pt is not on blood thinners  Pt denies issues with constipation  Pt has frequent issues with constipation RN instructed pt to use Miralax per bottles instructions a week before prep days. Pt states they will Pt is not on dialysis Pt denies any upcoming cardiac testing Pt encouraged to use to use Singlecare or Goodrx to reduce cost  Patient's chart reviewed by Cathlyn Parsons CNRA prior to pre-visit and patient appropriate for the [facility].  Pre-visit completed and red dot placed by patient's name on their procedure day (on provider's schedule).  . Visit by phone Pt states  weight is 180lb Instructions reviewed with pt and pt states understanding. Instructed to review again prior to procedure. Pt states they will.  Instructions sent by mail with coupon and by my chart

## 2022-06-22 ENCOUNTER — Telehealth: Payer: Self-pay | Admitting: Gastroenterology

## 2022-06-22 ENCOUNTER — Other Ambulatory Visit: Payer: Self-pay | Admitting: Primary Care

## 2022-06-22 ENCOUNTER — Other Ambulatory Visit: Payer: Self-pay | Admitting: Urology

## 2022-06-22 DIAGNOSIS — R109 Unspecified abdominal pain: Secondary | ICD-10-CM

## 2022-06-22 NOTE — Telephone Encounter (Signed)
Patient is requesting a call back for a nurse, she is  schedule for a procedure on 4/17 and want to know what medication she can continue taking.please advise

## 2022-06-22 NOTE — Telephone Encounter (Signed)
Returned pts call.  Answered questions about medications she can take and foods she can eat.

## 2022-06-23 ENCOUNTER — Other Ambulatory Visit: Payer: Self-pay | Admitting: Primary Care

## 2022-06-23 DIAGNOSIS — E039 Hypothyroidism, unspecified: Secondary | ICD-10-CM

## 2022-06-24 ENCOUNTER — Encounter: Payer: Self-pay | Admitting: Gastroenterology

## 2022-06-24 ENCOUNTER — Ambulatory Visit (AMBULATORY_SURGERY_CENTER): Payer: 59 | Admitting: Gastroenterology

## 2022-06-24 VITALS — BP 127/73 | HR 69 | Temp 97.8°F | Resp 12 | Ht 62.0 in | Wt 180.0 lb

## 2022-06-24 DIAGNOSIS — D123 Benign neoplasm of transverse colon: Secondary | ICD-10-CM | POA: Diagnosis not present

## 2022-06-24 DIAGNOSIS — Z09 Encounter for follow-up examination after completed treatment for conditions other than malignant neoplasm: Secondary | ICD-10-CM | POA: Diagnosis present

## 2022-06-24 DIAGNOSIS — Z86010 Personal history of colonic polyps: Secondary | ICD-10-CM

## 2022-06-24 DIAGNOSIS — D122 Benign neoplasm of ascending colon: Secondary | ICD-10-CM

## 2022-06-24 MED ORDER — SODIUM CHLORIDE 0.9 % IV SOLN: 500.0000 mL | Freq: Once | INTRAVENOUS | Status: DC

## 2022-06-24 NOTE — Progress Notes (Signed)
Pt's states no medical or surgical changes since previsit or office visit. 

## 2022-06-24 NOTE — Op Note (Signed)
[facility] Patient Name: Abigail [name] Procedure Date: 06/24/2022 10:27 AM MRN: [id number] Endoscopist: Napoleon Form , MD, [id number] Age: 57 Referring MD:  Date of Birth: 02/09/1966 Gender: Female Account #: [id number] Procedure:                Colonoscopy Indications:              High risk colon cancer surveillance: Personal                            history of colonic polyps, High risk colon cancer                            surveillance: Personal history of adenoma less than                            10 mm in size Medicines:                Monitored Anesthesia Care Procedure:                Pre-Anesthesia Assessment:                           - Prior to the procedure, a History and Physical                            was performed, and patient medications and                            allergies were reviewed. The patient's tolerance of                            previous anesthesia was also reviewed. The risks                            and benefits of the procedure and the sedation                            options and risks were discussed with the patient.                            All questions were answered, and informed consent                            was obtained. Prior Anticoagulants: The patient has                            taken no anticoagulant or antiplatelet agents. ASA                            Grade Assessment: II - A patient with mild systemic                            disease. After reviewing the risks and benefits,  the patient was deemed in satisfactory condition to                            undergo the procedure.                           After obtaining informed consent, the colonoscope                            was passed under direct vision. Throughout the                            procedure, the patient's blood pressure, pulse, and                            oxygen saturations were monitored  continuously. The                            Olympus PCF-[id number] (#[id number]) Colonoscope was                            introduced through the anus and advanced to the the                            cecum, identified by appendiceal orifice and                            ileocecal valve. The colonoscopy was performed                            without difficulty. The patient tolerated the                            procedure well. The quality of the bowel                            preparation was good. The ileocecal valve,                            appendiceal orifice, and rectum were photographed. Scope In: 10:31:30 AM Scope Out: 10:50:27 AM Scope Withdrawal Time: 0 hours 15 minutes 1 second  Total Procedure Duration: 0 hours 18 minutes 57 seconds  Findings:                 The perianal and digital rectal examinations were                            normal.                           Three sessile polyps were found in the transverse                            colon and ascending colon. The polyps were 4 to 8  mm in size. These polyps were removed with a cold                            snare. Resection and retrieval were complete.                           Non-bleeding external and internal hemorrhoids were                            found during retroflexion. The hemorrhoids were                            medium-sized.                           Anal papilla(e) were hypertrophied. Complications:            No immediate complications. Estimated Blood Loss:     Estimated blood loss was minimal. Impression:               - Three 4 to 8 mm polyps in the transverse colon                            and in the ascending colon, removed with a cold                            snare. Resected and retrieved.                           - Non-bleeding external and internal hemorrhoids.                           - Anal papilla(e) were hypertrophied.                           -  The GI Genius (intelligent endoscopy module),                            computer-aided polyp detection system powered by AI                            was utilized to detect colorectal polyps through                            enhanced visualization during colonoscopy. Recommendation:           - Patient has a contact number available for                            emergencies. The signs and symptoms of potential                            delayed complications were discussed with the                            patient. Return to normal activities  tomorrow.                            Written discharge instructions were provided to the                            patient.                           - Resume previous diet.                           - Continue present medications.                           - Await pathology results.                           - Repeat colonoscopy in 3 - 5 years for                            surveillance based on pathology results. Napoleon Form, MD 06/24/2022 10:57:03 AM This report has been signed electronically.

## 2022-06-24 NOTE — Progress Notes (Unsigned)
Called to room to assist during endoscopic procedure.  Patient ID and intended procedure confirmed with present staff. Received instructions for my participation in the procedure from the performing physician.  

## 2022-06-24 NOTE — Progress Notes (Signed)
[facility] History and Physical   Primary Care Physician:  Doreene Nest, NP   Reason for Procedure:  History of adenomatous colon polyps  Plan:    Surveillance colonoscopy with possible interventions as needed     HPI: Abigail [name] is a very pleasant 57 y.o. female here for surveillance colonoscopy. Denies any nausea, vomiting, abdominal pain, melena or bright red blood per rectum  The risks and benefits as well as alternatives of endoscopic procedure(s) have been discussed and reviewed. All questions answered. The patient agrees to proceed.    Past Medical History:  Diagnosis Date   Anxiety    Bulging lumbar disc    CAP (community acquired pneumonia)    Cardiac arrhythmia due to congenital heart disease    no arrhythmia identified    Depression    Diverticulosis    Sigmoid colon   Fatty liver 05/2016   Frequent headaches    GERD (gastroesophageal reflux disease)    Heart disease    Hypertension    Kidney stones    Migraine    Obesity (BMI 35.0-39.9 without comorbidity)    Thyroid disease    UTI (urinary tract infection)     Past Surgical History:  Procedure Laterality Date   ABDOMINAL EXPLORATION SURGERY  1986   COLONOSCOPY  06/2016   CYSTOSCOPY/URETEROSCOPY/HOLMIUM LASER/STENT PLACEMENT Right 05/16/2021   Procedure: CYSTOSCOPY/URETEROSCOPY/HOLMIUM LASER/STENT PLACEMENT;  Surgeon: Jannifer Hick, MD;  Location: [facility];  Service: Urology;  Laterality: Right;   ESOPHAGEAL MANOMETRY N/A 12/06/2019   Procedure: ESOPHAGEAL MANOMETRY (EM);  Surgeon: Napoleon Form, MD;  Location: [facility];  Service: Endoscopy;  Laterality: N/A;   EXTRACORPOREAL SHOCK WAVE LITHOTRIPSY Left 12/03/2016   Procedure: LEFT EXTRACORPOREAL SHOCK WAVE LITHOTRIPSY (ESWL);  Surgeon: Bjorn Pippin, MD;  Location: [facility];  Service: Urology;  Laterality: Left;   KIDNEY STONE SURGERY     LEFT HEART CATH AND CORONARY ANGIOGRAPHY  07/28/2016    Large tortuous coronary  arteries no radiographic evidence of disease.  Not anginal chest pain.  Normal EF.   LITHOTRIPSY     NASAL SINUS SURGERY     ROTATOR CUFF REPAIR Right 12/27/2017   TRANSTHORACIC ECHOCARDIOGRAM  06/2017   EF 60-65%.  GR 1-2 DD.  Otherwise normal echo.  Normal valve function.  Normal wall motion.   TUBAL LIGATION      Prior to Admission medications   Medication Sig Start Date End Date Taking? Authorizing Provider  FLUoxetine (PROZAC) 40 MG capsule TAKE 1 CAPSULE BY MOUTH ONCE DAILY FOR ANXIETY 04/13/22  Yes Doreene Nest, NP  levothyroxine (SYNTHROID) 75 MCG tablet TAKE 1 TABLET BY MOUTH IN THE MORNING ON  AN  EMPTY  STOMACH  WITH  WATER  ONLY  NO  FOOD  OR  OTHER  MEDICATIONS  FOR  30  MINUTES 06/23/22  Yes Doreene Nest, NP  LINZESS 145 MCG CAPS capsule TAKE 1 CAPSULE BY MOUTH ONCE AT BEDTIME 06/01/22  Yes Ericberto Padget V, MD  metoprolol tartrate (LOPRESSOR) 25 MG tablet TAKE 1 TABLET BY MOUTH TWICE DAILY . 10/06/21  Yes Marykay Lex, MD  oxybutynin (DITROPAN-XL) 5 MG 24 hr tablet Take 1 tablet by mouth once daily 06/23/22  Yes McKenzie, Mardene Celeste, MD  rosuvastatin (CRESTOR) 20 MG tablet Take 1 tablet (20 mg total) by mouth daily. 10/06/21  Yes Marykay Lex, MD  VITAMIN D3 1.25 MG (50000 UT) capsule Take 1 capsule by mouth once a week 05/03/22  Yes  Doreene Nest, NP  Accu-Chek FastClix Lancets MISC USE 1 LANCET TO CHECK BLOOD SUGAR THREE TIMES DAILY 06/24/21   Doreene Nest, NP  ADVIL 200 MG tablet Take 200-600 mg by mouth every 6 (six) hours as needed for mild pain or headache. Patient not taking: Reported on 06/16/2022    [provider]  albuterol (PROAIR HFA) 108 (90 Base) MCG/ACT inhaler Inhale 2 puffs into the lungs every 6 (six) hours as needed for wheezing or shortness of breath. 02/02/22   Karie Schwalbe, MD  Aspirin-Caffeine (BAYER BACK & BODY PO) Take 1 tablet by mouth daily as needed (body pain).    [provider]  benzonatate (TESSALON)  200 MG capsule Take 1 capsule (200 mg total) by mouth 3 (three) times daily as needed for cough. Patient not taking: Reported on 06/16/2022 02/02/22   Tillman Abide I, MD  Blood Glucose Monitoring Suppl (ACCU-CHEK GUIDE) w/Device KIT by Does not apply route.    [provider]  clotrimazole (LOTRIMIN) 1 % cream Apply to affected area 2 times daily Patient not taking: Reported on 03/13/2022 03/06/22   Rolan Bucco, MD  docusate sodium (COLACE) 100 MG capsule Take 1 capsule (100 mg total) by mouth daily as needed for up to 30 doses. Patient not taking: Reported on 06/16/2022 05/16/21   Jannifer Hick, MD  esomeprazole (NEXIUM) 40 MG capsule TAKE 1 CAPSULE BY MOUTH DAILY Patient not taking: Reported on 05/29/2022 02/24/16   Doreene Nest, NP  glucose blood (ACCU-CHEK GUIDE) test strip USE ONE STRIP THREE TIMES DAILY Patient not taking: Reported on 06/16/2022 08/08/21   Doreene Nest, NP  HYDROcodone-acetaminophen (NORCO/VICODIN) 5-325 MG tablet Take 1 tablet by mouth every 6 (six) hours as needed. 07/21/21   [provider]  hydrOXYzine (ATARAX/VISTARIL) 10 MG tablet Take 1 to 2 tablets by mouth twice daily as needed for anxiety. Patient not taking: Reported on 06/16/2022 07/04/19   Doreene Nest, NP  Lancets Misc. (ACCU-CHEK FASTCLIX LANCET) KIT Use as directed to test blood sugar daily 10/03/19   Doreene Nest, NP  ondansetron (ZOFRAN-ODT) 4 MG disintegrating tablet  ODT q4 hours prn nausea/vomit 03/06/22   Rolan Bucco, MD  oxybutynin (DITROPAN-XL) 10 MG 24 hr tablet Take 10 mg by mouth at bedtime.    [provider]  tamsulosin (FLOMAX) 0.4 MG CAPS capsule Take 1 capsule (0.4 mg total) by mouth daily. As needed for kidney stone Patient not taking: Reported on 06/16/2022 04/16/22   Doreene Nest, NP  traMADol (ULTRAM) 50 MG tablet Take 50 mg by mouth every 4 (four) hours as needed. 09/05/21   [provider]    Current Outpatient Medications   Medication Sig Dispense Refill   FLUoxetine (PROZAC) 40 MG capsule TAKE 1 CAPSULE BY MOUTH ONCE DAILY FOR ANXIETY 90 capsule 0   levothyroxine (SYNTHROID) 75 MCG tablet TAKE 1 TABLET BY MOUTH IN THE MORNING ON  AN  EMPTY  STOMACH  WITH  WATER  ONLY  NO  FOOD  OR  OTHER  MEDICATIONS  FOR  30  MINUTES 90 tablet 3   LINZESS 145 MCG CAPS capsule TAKE 1 CAPSULE BY MOUTH ONCE AT BEDTIME 30 capsule 0   metoprolol tartrate (LOPRESSOR) 25 MG tablet TAKE 1 TABLET BY MOUTH TWICE DAILY . 180 tablet 3   oxybutynin (DITROPAN-XL) 5 MG 24 hr tablet Take 1 tablet by mouth once daily 30 tablet 0   rosuvastatin (CRESTOR) 20 MG tablet Take  1 tablet (20 mg total) by mouth daily. 90 tablet 3   VITAMIN D3 1.25 MG (50000 UT) capsule Take 1 capsule by mouth once a week 12 capsule 0   Accu-Chek FastClix Lancets MISC USE 1 LANCET TO CHECK BLOOD SUGAR THREE TIMES DAILY 300 each 1   ADVIL 200 MG tablet Take 200-600 mg by mouth every 6 (six) hours as needed for mild pain or headache. (Patient not taking: Reported on 06/16/2022)     albuterol (PROAIR HFA) 108 (90 Base) MCG/ACT inhaler Inhale 2 puffs into the lungs every 6 (six) hours as needed for wheezing or shortness of breath. 8 g 0   Aspirin-Caffeine (BAYER BACK & BODY PO) Take 1 tablet by mouth daily as needed (body pain).     benzonatate (TESSALON) 200 MG capsule Take 1 capsule (200 mg total) by mouth 3 (three) times daily as needed for cough. (Patient not taking: Reported on 06/16/2022) 60 capsule 0   Blood Glucose Monitoring Suppl (ACCU-CHEK GUIDE) w/Device KIT by Does not apply route.     clotrimazole (LOTRIMIN) 1 % cream Apply to affected area 2 times daily (Patient not taking: Reported on 03/13/2022) 15 g 0   docusate sodium (COLACE) 100 MG capsule Take 1 capsule (100 mg total) by mouth daily as needed for up to 30 doses. (Patient not taking: Reported on 06/16/2022) 30 capsule 0   esomeprazole (NEXIUM) 40 MG capsule TAKE 1 CAPSULE BY MOUTH DAILY (Patient not taking: Reported  on 05/29/2022) 90 capsule 1   glucose blood (ACCU-CHEK GUIDE) test strip USE ONE STRIP THREE TIMES DAILY (Patient not taking: Reported on 06/16/2022) 300 each 1   HYDROcodone-acetaminophen (NORCO/VICODIN) 5-325 MG tablet Take 1 tablet by mouth every 6 (six) hours as needed.     hydrOXYzine (ATARAX/VISTARIL) 10 MG tablet Take 1 to 2 tablets by mouth twice daily as needed for anxiety. (Patient not taking: Reported on 06/16/2022) 30 tablet 0   Lancets Misc. (ACCU-CHEK FASTCLIX LANCET) KIT Use as directed to test blood sugar daily 1 kit 0   ondansetron (ZOFRAN-ODT) 4 MG disintegrating tablet 4mg  ODT q4 hours prn nausea/vomit 6 tablet 0   oxybutynin (DITROPAN-XL) 10 MG 24 hr tablet Take 10 mg by mouth at bedtime.     tamsulosin (FLOMAX) 0.4 MG CAPS capsule Take 1 capsule (0.4 mg total) by mouth daily. As needed for kidney stone (Patient not taking: Reported on 06/16/2022) 60 capsule 0   traMADol (ULTRAM) 50 MG tablet Take 50 mg by mouth every 4 (four) hours as needed.     Current Facility-Administered Medications  Medication Dose Route Frequency Provider Last Rate Last Admin   0.9 %  sodium chloride infusion  500 mL Intravenous Once Napoleon Form, MD        Allergies as of 06/24/2022 - Review Complete 06/24/2022  Allergen Reaction Noted   Latex Rash 11/27/2016   Penicillins Nausea Only 12/29/2017    Family History  Problem Relation Age of Onset   Gallstones Mother    COPD Father    Emphysema Father    Colon cancer Neg Hx    Stomach cancer Neg Hx    Esophageal cancer Neg Hx    Pancreatic cancer Neg Hx    Liver disease Neg Hx    Colon polyps Neg Hx    Rectal cancer Neg Hx     Social History   Socioeconomic History   Marital status: Single    Spouse name: Not on file   Number of children:  Not on file   Years of education: Not on file   Highest education level: Not on file  Occupational History   Not on file  Tobacco Use   Smoking status: Former    Types: Cigarettes    Quit  date: 2008    Years since quitting: 16.3   Smokeless tobacco: Never   Tobacco comments:    used tobacco "socially"  Building services engineer Use: Never used  Substance and Sexual Activity   Alcohol use: No    Alcohol/week: 0.0 standard drinks of alcohol   Drug use: No   Sexual activity: Not on file  Other Topics Concern   Not on file  Social History Narrative   Single.   Has a set of Twins, age 69.   Works for the [employer] and [employer]   Enjoys relaxing, spending time with her children.   Social Determinants of Health   Financial Resource Strain: Not on file  Food Insecurity: Not on file  Transportation Needs: Not on file  Physical Activity: Not on file  Stress: Not on file  Social Connections: Not on file  Intimate Partner Violence: Not on file    Review of Systems:  All other review of systems negative except as mentioned in the HPI.  Physical Exam: Vital signs in last 24 hours: Blood Pressure (Abnormal) 144/71   Pulse 80   Temperature 97.8 F (36.6 C)   Height 5\' 2"  (1.575 m)   Weight 180 lb (81.6 kg)   Last Menstrual Period 08/12/2015 Comment: patient states hx. tubal ligation  Oxygen Saturation 96%   Body Mass Index 32.92 kg/m  General:   Alert, NAD Lungs:  Clear .   Heart:  Regular rate and rhythm Abdomen:  Soft, nontender and nondistended. Neuro/Psych:  Alert and cooperative. Normal mood and affect. A and O x 3  Reviewed labs, radiology imaging, old records and pertinent past GI work up  Patient is appropriate for planned procedure(s) and anesthesia in an ambulatory setting   K. Scherry Ran , MD [phone]

## 2022-06-24 NOTE — Patient Instructions (Addendum)
Handouts Provided:  Polyps  Dr. Elana Alm Office will call you to schedule follow up appointment.  YOU HAD AN ENDOSCOPIC PROCEDURE TODAY AT THE  ENDOSCOPY CENTER:   Refer to the procedure report that was given to you for any specific questions about what was found during the examination.  If the procedure report does not answer your questions, please call your gastroenterologist to clarify.  If you requested that your care partner not be given the details of your procedure findings, then the procedure report has been included in a sealed envelope for you to review at your convenience later.  YOU SHOULD EXPECT: Some feelings of bloating in the abdomen. Passage of more gas than usual.  Walking can help get rid of the air that was put into your GI tract during the procedure and reduce the bloating. If you had a lower endoscopy (such as a colonoscopy or flexible sigmoidoscopy) you may notice spotting of blood in your stool or on the toilet paper. If you underwent a bowel prep for your procedure, you may not have a normal bowel movement for a few days.  Please Note:  You might notice some irritation and congestion in your nose or some drainage.  This is from the oxygen used during your procedure.  There is no need for concern and it should clear up in a day or so.  SYMPTOMS TO REPORT IMMEDIATELY:  Following lower endoscopy (colonoscopy or flexible sigmoidoscopy):  Excessive amounts of blood in the stool  Significant tenderness or worsening of abdominal pains  Swelling of the abdomen that is new, acute  Fever of 100F or higher  For urgent or emergent issues, a gastroenterologist can be reached at any hour by calling [phone]. Do not use MyChart messaging for urgent concerns.    DIET:  We do recommend a small meal at first, but then you may proceed to your regular diet.  Drink plenty of fluids but you should avoid alcoholic beverages for 24 hours.  ACTIVITY:  You should plan to take  it easy for the rest of today and you should NOT DRIVE or use heavy machinery until tomorrow (because of the sedation medicines used during the test).    FOLLOW UP: Our staff will call the number listed on your records the next business day following your procedure.  We will call around 7:15- 8:00 am to check on you and address any questions or concerns that you may have regarding the information given to you following your procedure. If we do not reach you, we will leave a message.     If any biopsies were taken you will be contacted by phone or by letter within the next 1-3 weeks.  Please call us at [phone] if you have not heard about the biopsies in 3 weeks.    SIGNATURES/CONFIDENTIALITY: You and/or your care partner have signed paperwork which will be entered into your electronic medical record.  These signatures attest to the fact that that the information above on your After Visit Summary has been reviewed and is understood.  Full responsibility of the confidentiality of this discharge information lies with you and/or your care-partner.

## 2022-06-24 NOTE — Progress Notes (Unsigned)
To pacu, VSS. Report to [email or web address] 

## 2022-06-25 ENCOUNTER — Telehealth: Payer: Self-pay | Admitting: *Deleted

## 2022-06-25 NOTE — Telephone Encounter (Signed)
  Follow up Call-    Row Labels 06/24/2022    9:29 AM  Call back number   Section Header. No data exists in this row.   Post procedure Call Back phone  #   [phone]  Permission to leave phone message   Yes     Patient questions:  Do you have a fever, pain , or abdominal swelling? No. Pain Score  0 *  Have you tolerated food without any problems? Yes.    Have you been able to return to your normal activities? Yes.    Do you have any questions about your discharge instructions: Diet   No. Medications  No. Follow up visit  No.  Do you have questions or concerns about your Care? No.  Actions: * If pain score is 4 or above: No action needed, pain <4.

## 2022-06-28 ENCOUNTER — Other Ambulatory Visit: Payer: Self-pay | Admitting: Gastroenterology

## 2022-07-01 ENCOUNTER — Encounter: Payer: Self-pay | Admitting: Gastroenterology

## 2022-07-01 ENCOUNTER — Encounter: Payer: Self-pay | Admitting: Primary Care

## 2022-07-01 ENCOUNTER — Telehealth: Payer: 59 | Admitting: Primary Care

## 2022-07-01 VITALS — BP 122/68 | HR 66 | Temp 97.6°F | Ht 62.0 in | Wt 186.0 lb

## 2022-07-01 DIAGNOSIS — R051 Acute cough: Secondary | ICD-10-CM

## 2022-07-01 LAB — POC COVID19 BINAXNOW: SARS Coronavirus 2 Ag: NEGATIVE

## 2022-07-01 MED ORDER — ONDANSETRON 4 MG PO TBDP
4.0000 mg | ORAL_TABLET | Freq: Three times a day (TID) | ORAL | 0 refills | Status: DC | PRN
Start: 2022-07-01 — End: 2022-08-26

## 2022-07-01 MED ORDER — ALBUTEROL SULFATE HFA 108 (90 BASE) MCG/ACT IN AERS
2.0000 | INHALATION_SPRAY | Freq: Four times a day (QID) | RESPIRATORY_TRACT | 0 refills | Status: AC | PRN
Start: 2022-07-01 — End: ?

## 2022-07-01 MED ORDER — HYDROCOD POLI-CHLORPHE POLI ER 10-8 MG/5ML PO SUER
5.0000 mL | Freq: Two times a day (BID) | ORAL | 0 refills | Status: DC | PRN
Start: 2022-07-01 — End: 2022-10-01

## 2022-07-01 MED ORDER — BENZONATATE 200 MG PO CAPS
200.0000 mg | ORAL_CAPSULE | Freq: Three times a day (TID) | ORAL | 0 refills | Status: DC | PRN
Start: 2022-07-01 — End: 2022-07-07

## 2022-07-01 NOTE — Assessment & Plan Note (Addendum)
Suspect viral etiology at this point. Rapid Covid-19 test today negative. Exam reassuring.  Discussed conservative care. Rx for Tussionex suspension, Tessalon Perles, and Zofran provided. Will also update albuterol inhaler.   Return precautions provided.

## 2022-07-01 NOTE — Patient Instructions (Signed)
You may take Benzonatate capsules for cough. Take 1 capsule by mouth three times daily as needed for cough.  You may take the cough suppressant every 12 hours as needed for cough and rest. Caution this medication contains codeine which may cause drowsiness.   Use the albuterol inhaler and Zofran if needed  Please update me Tuesday next week if you are not improving.  It was a pleasure to see you today!

## 2022-07-01 NOTE — Progress Notes (Signed)
Subjective:    Patient ID: Abigail [name], female    DOB: 05-10-1965, 57 y.o.   MRN: [id number]  Sore Throat  Associated symptoms include coughing and headaches.    Abigail [name] is a very pleasant 57 y.o. female with a history of GERD, type 2 diabetes, sleep apnea, allergic rhinitis, pleurisy, pulmonary nodules who presents today to discuss URI symptoms.  Symptom onset yesterday with scratchy sore throat. She then developed headache, chills, nausea, productive cough. She has not tested for Covid-19 infection.   She denies fevers, body aches. She's taken Nasacort, albuterol inhaler, Tessalon Perles.   She denies known sick contacts. Is needing a work note.    Review of Systems  Constitutional:  Positive for chills.  HENT:  Positive for sore throat.   Respiratory:  Positive for cough.   Neurological:  Positive for headaches.         Past Medical History:  Diagnosis Date   Anxiety    Bulging lumbar disc    CAP (community acquired pneumonia)    Cardiac arrhythmia due to congenital heart disease    no arrhythmia identified    Depression    Diverticulosis    Sigmoid colon   Fatty liver 05/2016   Frequent headaches    GERD (gastroesophageal reflux disease)    Heart disease    Hypertension    Kidney stones    Migraine    Obesity (BMI 35.0-39.9 without comorbidity)    Thyroid disease    UTI (urinary tract infection)     Social History   Socioeconomic History   Marital status: Single    Spouse name: Not on file   Number of children: Not on file   Years of education: Not on file   Highest education level: Not on file  Occupational History   Not on file  Tobacco Use   Smoking status: Former    Types: Cigarettes    Quit date: 2008    Years since quitting: 16.3   Smokeless tobacco: Never   Tobacco comments:    used tobacco "socially"  Building services engineer Use: Never used  Substance and Sexual Activity   Alcohol use: No    Alcohol/week: 0.0  standard drinks of alcohol   Drug use: No   Sexual activity: Not on file  Other Topics Concern   Not on file  Social History Narrative   Single.   Has a set of Twins, age 59.   Works for the [employer] and [employer]   Enjoys relaxing, spending time with her children.   Social Determinants of Health   Financial Resource Strain: Not on file  Food Insecurity: Not on file  Transportation Needs: Not on file  Physical Activity: Not on file  Stress: Not on file  Social Connections: Not on file  Intimate Partner Violence: Not on file    Past Surgical History:  Procedure Laterality Date   ABDOMINAL EXPLORATION SURGERY  1986   COLONOSCOPY  06/2016   CYSTOSCOPY/URETEROSCOPY/HOLMIUM LASER/STENT PLACEMENT Right 05/16/2021   Procedure: CYSTOSCOPY/URETEROSCOPY/HOLMIUM LASER/STENT PLACEMENT;  Surgeon: Jannifer Hick, MD;  Location: [facility];  Service: Urology;  Laterality: Right;   ESOPHAGEAL MANOMETRY N/A 12/06/2019   Procedure: ESOPHAGEAL MANOMETRY (EM);  Surgeon: Napoleon Form, MD;  Location: [facility];  Service: Endoscopy;  Laterality: N/A;   EXTRACORPOREAL SHOCK WAVE LITHOTRIPSY Left 12/03/2016   Procedure: LEFT EXTRACORPOREAL SHOCK WAVE LITHOTRIPSY (ESWL);  Surgeon: Bjorn Pippin, MD;  Location: [facility];  Service: Urology;  Laterality: Left;   KIDNEY STONE SURGERY     LEFT HEART CATH AND CORONARY ANGIOGRAPHY  07/28/2016    Large tortuous coronary arteries no radiographic evidence of disease.  Not anginal chest pain.  Normal EF.   LITHOTRIPSY     NASAL SINUS SURGERY     ROTATOR CUFF REPAIR Right 12/27/2017   TRANSTHORACIC ECHOCARDIOGRAM  06/2017   EF 60-65%.  GR 1-2 DD.  Otherwise normal echo.  Normal valve function.  Normal wall motion.   TUBAL LIGATION      Family History  Problem Relation Age of Onset   Gallstones Mother    COPD Father    Emphysema Father    Colon cancer Neg Hx    Stomach cancer Neg Hx    Esophageal cancer Neg Hx    Pancreatic cancer  Neg Hx    Liver disease Neg Hx    Colon polyps Neg Hx    Rectal cancer Neg Hx     Allergies  Allergen Reactions   Latex Rash   Penicillins Nausea Only    Has patient had a PCN reaction causing immediate rash, facial/tongue/throat swelling, SOB or lightheadedness with hypotension: No Has patient had a PCN reaction causing severe rash involving mucus membranes or skin necrosis: No Has patient had a PCN reaction that required hospitalization: No Has patient had a PCN reaction occurring within the last 10 years: No If all of the above answers are "NO", then may proceed with Cephalosporin use.     Current Outpatient Medications on File Prior to Visit  Medication Sig Dispense Refill   Accu-Chek FastClix Lancets MISC USE 1 LANCET TO CHECK BLOOD SUGAR THREE TIMES DAILY 300 each 1   ADVIL 200 MG tablet Take 200-600 mg by mouth every 6 (six) hours as needed for mild pain or headache.     Aspirin-Caffeine (BAYER BACK & BODY PO) Take 1 tablet by mouth daily as needed (body pain).     Blood Glucose Monitoring Suppl (ACCU-CHEK GUIDE) w/Device KIT by Does not apply route.     clotrimazole (LOTRIMIN) 1 % cream Apply to affected area 2 times daily 15 g 0   docusate sodium (COLACE) 100 MG capsule Take 1 capsule (100 mg total) by mouth daily as needed for up to 30 doses. 30 capsule 0   esomeprazole (NEXIUM) 40 MG capsule TAKE 1 CAPSULE BY MOUTH DAILY 90 capsule 1   FLUoxetine (PROZAC) 40 MG capsule TAKE 1 CAPSULE BY MOUTH ONCE DAILY FOR ANXIETY 90 capsule 0   HYDROcodone-acetaminophen (NORCO/VICODIN) 5-325 MG tablet Take 1 tablet by mouth every 6 (six) hours as needed.     hydrOXYzine (ATARAX/VISTARIL) 10 MG tablet Take 1 to 2 tablets by mouth twice daily as needed for anxiety. 30 tablet 0   Lancets Misc. (ACCU-CHEK FASTCLIX LANCET) KIT Use as directed to test blood sugar daily 1 kit 0   levothyroxine (SYNTHROID) 75 MCG tablet TAKE 1 TABLET BY MOUTH IN THE MORNING ON  AN  EMPTY  STOMACH  WITH  WATER   ONLY  NO  FOOD  OR  OTHER  MEDICATIONS  FOR  30  MINUTES 90 tablet 3   LINZESS 145 MCG CAPS capsule Take 1 capsule by mouth at bedtime 30 capsule 0   metoprolol tartrate (LOPRESSOR) 25 MG tablet TAKE 1 TABLET BY MOUTH TWICE DAILY . 180 tablet 3   oxybutynin (DITROPAN-XL) 10 MG 24 hr tablet Take 10 mg by mouth at bedtime.  rosuvastatin (CRESTOR) 20 MG tablet Take 1 tablet (20 mg total) by mouth daily. 90 tablet 3   traMADol (ULTRAM) 50 MG tablet Take 50 mg by mouth every 4 (four) hours as needed.     VITAMIN D3 1.25 MG (50000 UT) capsule Take 1 capsule by mouth once a week 12 capsule 0   glucose blood (ACCU-CHEK GUIDE) test strip USE ONE STRIP THREE TIMES DAILY (Patient not taking: Reported on 06/16/2022) 300 each 1   oxybutynin (DITROPAN-XL) 5 MG 24 hr tablet Take 1 tablet by mouth once daily (Patient not taking: Reported on 07/01/2022) 30 tablet 0   tamsulosin (FLOMAX) 0.4 MG CAPS capsule Take 1 capsule (0.4 mg total) by mouth daily. As needed for kidney stone (Patient not taking: Reported on 06/16/2022) 60 capsule 0   No current facility-administered medications on file prior to visit.    BP 122/68   Pulse 66   Temp 97.6 F (36.4 C) (Temporal)   Ht  (1.575 m)   Wt 186 lb (84.4 kg)   LMP 08/12/2015 Comment: patient states hx. tubal ligation  SpO2 98%   BMI 34.02 kg/m  Objective:   Physical Exam Constitutional:      Appearance: She is ill-appearing.  HENT:     Right Ear: Tympanic membrane and ear canal normal.     Left Ear: Tympanic membrane and ear canal normal.     Nose:     Right Sinus: No maxillary sinus tenderness or frontal sinus tenderness.     Left Sinus: No maxillary sinus tenderness or frontal sinus tenderness.     Mouth/Throat:     Pharynx: No posterior oropharyngeal erythema.  Eyes:     Conjunctiva/sclera: Conjunctivae normal.  Cardiovascular:     Rate and Rhythm: Normal rate and regular rhythm.  Pulmonary:     Effort: Pulmonary effort is normal.     Breath  sounds: Normal breath sounds. No wheezing or rales.     Comments: Mildly congested cough noted once during exam Musculoskeletal:     Cervical back: Neck supple.  Lymphadenopathy:     Cervical: No cervical adenopathy.  Skin:    General: Skin is warm and dry.           Assessment & Plan:  Acute cough Assessment & Plan: Suspect viral etiology at this point. Rapid Covid-19 test today negative. Exam reassuring.  Discussed conservative care. Rx for Tussionex suspension, Tessalon Perles, and Zofran provided. Will also update albuterol inhaler.   Return precautions provided.   Orders: -     POC COVID-19 BinaxNow -     Albuterol Sulfate HFA; Inhale 2 puffs into the lungs every 6 (six) hours as needed for wheezing or shortness of breath.  Dispense: 8 g; Refill: 0 -     Benzonatate; Take 1 capsule (200 mg total) by mouth 3 (three) times daily as needed for cough.  Dispense: 15 capsule; Refill: 0 -     Ondansetron; Take 1 tablet (4 mg total) by mouth every 8 (eight) hours as needed for nausea or vomiting.  Dispense: 15 tablet; Refill: 0 -     Hydrocod Poli-Chlorphe Poli ER; Take 5 mLs by mouth every 12 (twelve) hours as needed for cough.  Dispense: 50 mL; Refill: 0        Doreene Nest, NP

## 2022-07-04 ENCOUNTER — Other Ambulatory Visit: Payer: Self-pay | Admitting: Primary Care

## 2022-07-04 DIAGNOSIS — F419 Anxiety disorder, unspecified: Secondary | ICD-10-CM

## 2022-07-07 DIAGNOSIS — R051 Acute cough: Secondary | ICD-10-CM

## 2022-07-07 MED ORDER — AZITHROMYCIN 250 MG PO TABS
ORAL_TABLET | ORAL | 0 refills | Status: DC
Start: 2022-07-07 — End: 2022-10-01

## 2022-07-07 MED ORDER — BENZONATATE 200 MG PO CAPS
200.0000 mg | ORAL_CAPSULE | Freq: Three times a day (TID) | ORAL | 0 refills | Status: DC | PRN
Start: 2022-07-07 — End: 2022-10-01

## 2022-07-20 ENCOUNTER — Other Ambulatory Visit: Payer: Self-pay | Admitting: Primary Care

## 2022-07-20 DIAGNOSIS — E559 Vitamin D deficiency, unspecified: Secondary | ICD-10-CM

## 2022-07-20 DIAGNOSIS — R051 Acute cough: Secondary | ICD-10-CM

## 2022-07-25 ENCOUNTER — Other Ambulatory Visit: Payer: Self-pay | Admitting: Gastroenterology

## 2022-08-01 ENCOUNTER — Other Ambulatory Visit: Payer: Self-pay | Admitting: Primary Care

## 2022-08-01 DIAGNOSIS — F32A Depression, unspecified: Secondary | ICD-10-CM

## 2022-08-23 ENCOUNTER — Other Ambulatory Visit: Payer: Self-pay | Admitting: Gastroenterology

## 2022-08-26 ENCOUNTER — Encounter: Payer: Self-pay | Admitting: Primary Care

## 2022-08-26 ENCOUNTER — Telehealth: Payer: Self-pay | Admitting: Primary Care

## 2022-08-26 ENCOUNTER — Ambulatory Visit: Payer: 59 | Admitting: Primary Care

## 2022-08-26 VITALS — BP 104/62 | HR 70 | Temp 97.4°F | Ht 62.0 in | Wt 187.0 lb

## 2022-08-26 DIAGNOSIS — R110 Nausea: Secondary | ICD-10-CM | POA: Diagnosis not present

## 2022-08-26 DIAGNOSIS — R1011 Right upper quadrant pain: Secondary | ICD-10-CM

## 2022-08-26 MED ORDER — ONDANSETRON 4 MG PO TBDP
4.0000 mg | ORAL_TABLET | Freq: Three times a day (TID) | ORAL | 0 refills | Status: DC | PRN
Start: 2022-08-26 — End: 2022-10-05

## 2022-08-26 NOTE — Progress Notes (Signed)
Subjective:    Patient ID: Abigail [name], female    DOB: [birth year], 57 y.o.   MRN: [id number]  Back Pain Associated symptoms include abdominal pain. Pertinent negatives include no fever.    Abigail [name] is a very pleasant 57 y.o. female with a history of chronic back pain, renal stones, neck pain who presents today to discuss back pain.  Chronic history of lower back pain, follows with spine specialist and is managed on hydrocodone-acetaminophen 5-325 mg, tramadol 50 mg as needed.   Since November 2023 she's experienced constant right thoracic back pain with radiation to her right upper abdomen. Also with abdominal bloating, nausea, fatigue,alternating constipation and diarrhea. Symptoms are worse when laying onto her right side in bed, when getting up and moving around, with eating every meal.   Her symptoms have not improved at all. She's taken all of her pain medications which haven't helped at all.  She denies vomiting, tick bites, right lower quadrant abdominal pain.  She does have her gallbladder.  She underwent colonoscopy in April 2024 which showed polyps and internal hemorrhoids, otherwise unremarkable. She's been taking Zofran intermittently with improvement.    BP Readings from Last 3 Encounters:  08/26/22 104/62  07/01/22 122/68  06/24/22 127/73     Review of Systems  Constitutional:  Positive for fatigue. Negative for fever.  Gastrointestinal:  Positive for abdominal pain, constipation and nausea. Negative for diarrhea and vomiting.  Musculoskeletal:  Positive for back pain.         Past Medical History:  Diagnosis Date   Anxiety    Bulging lumbar disc    CAP (community acquired pneumonia)    Cardiac arrhythmia due to congenital heart disease    no arrhythmia identified    Depression    Diverticulosis    Sigmoid colon   Fatty liver 05/2016   Frequent headaches    GERD (gastroesophageal reflux disease)    Heart disease    Hypertension     Kidney stones    Migraine    Obesity (BMI 35.0-39.9 without comorbidity)    Thyroid disease    UTI (urinary tract infection)     Social History   Socioeconomic History   Marital status: Single    Spouse name: Not on file   Number of children: Not on file   Years of education: Not on file   Highest education level: Not on file  Occupational History   Not on file  Tobacco Use   Smoking status: Former    Types: Cigarettes    Quit date: 2008    Years since quitting: 16.4   Smokeless tobacco: Never   Tobacco comments:    used tobacco "socially"  Building services engineer Use: Never used  Substance and Sexual Activity   Alcohol use: No    Alcohol/week: 0.0 standard drinks of alcohol   Drug use: No   Sexual activity: Not on file  Other Topics Concern   Not on file  Social History Narrative   Single.   Has a set of Twins, age 80.   Works for the [employer] and [employer]   Enjoys relaxing, spending time with her children.   Social Determinants of Health   Financial Resource Strain: Not on file  Food Insecurity: Not on file  Transportation Needs: Not on file  Physical Activity: Not on file  Stress: Not on file  Social Connections: Not on file  Intimate Partner Violence: Not on  file    Past Surgical History:  Procedure Laterality Date   ABDOMINAL EXPLORATION SURGERY  1986   COLONOSCOPY  06/2016   CYSTOSCOPY/URETEROSCOPY/HOLMIUM LASER/STENT PLACEMENT Right 05/16/2021   Procedure: CYSTOSCOPY/URETEROSCOPY/HOLMIUM LASER/STENT PLACEMENT;  Surgeon: Jannifer Hick, MD;  Location: [facility];  Service: Urology;  Laterality: Right;   ESOPHAGEAL MANOMETRY N/A 12/06/2019   Procedure: ESOPHAGEAL MANOMETRY (EM);  Surgeon: Napoleon Form, MD;  Location: [facility];  Service: Endoscopy;  Laterality: N/A;   EXTRACORPOREAL SHOCK WAVE LITHOTRIPSY Left 12/03/2016   Procedure: LEFT EXTRACORPOREAL SHOCK WAVE LITHOTRIPSY (ESWL);  Surgeon: Bjorn Pippin, MD;  Location: [facility];   Service: Urology;  Laterality: Left;   KIDNEY STONE SURGERY     LEFT HEART CATH AND CORONARY ANGIOGRAPHY  07/28/2016    Large tortuous coronary arteries no radiographic evidence of disease.  Not anginal chest pain.  Normal EF.   LITHOTRIPSY     NASAL SINUS SURGERY     ROTATOR CUFF REPAIR Right 12/27/2017   TRANSTHORACIC ECHOCARDIOGRAM  06/2017   EF 60-65%.  GR 1-2 DD.  Otherwise normal echo.  Normal valve function.  Normal wall motion.   TUBAL LIGATION      Family History  Problem Relation Age of Onset   Gallstones Mother    COPD Father    Emphysema Father    Colon cancer Neg Hx    Stomach cancer Neg Hx    Esophageal cancer Neg Hx    Pancreatic cancer Neg Hx    Liver disease Neg Hx    Colon polyps Neg Hx    Rectal cancer Neg Hx     Allergies  Allergen Reactions   Latex Rash   Penicillins Nausea Only    Has patient had a PCN reaction causing immediate rash, facial/tongue/throat swelling, SOB or lightheadedness with hypotension: No Has patient had a PCN reaction causing severe rash involving mucus membranes or skin necrosis: No Has patient had a PCN reaction that required hospitalization: No Has patient had a PCN reaction occurring within the last 10 years: No If all of the above answers are "NO", then may proceed with Cephalosporin use.     Current Outpatient Medications on File Prior to Visit  Medication Sig Dispense Refill   Accu-Chek FastClix Lancets MISC USE 1 LANCET TO CHECK BLOOD SUGAR THREE TIMES DAILY 300 each 1   ADVIL 200 MG tablet Take 200-600 mg by mouth every 6 (six) hours as needed for mild pain or headache.     albuterol (PROAIR HFA) 108 (90 Base) MCG/ACT inhaler Inhale 2 puffs into the lungs every 6 (six) hours as needed for wheezing or shortness of breath. 8 g 0   Aspirin-Caffeine (BAYER BACK & BODY PO) Take 1 tablet by mouth daily as needed (body pain).     Blood Glucose Monitoring Suppl (ACCU-CHEK GUIDE) w/Device KIT by Does not apply route.      Cholecalciferol (VITAMIN D3) 1.25 MG (50000 UT) CAPS Take 1 capsule by mouth once a week 12 capsule 0   clotrimazole (LOTRIMIN) 1 % cream Apply to affected area 2 times daily 15 g 0   esomeprazole (NEXIUM) 40 MG capsule TAKE 1 CAPSULE BY MOUTH DAILY 90 capsule 1   FLUoxetine (PROZAC) 40 MG capsule TAKE 1 CAPSULE BY MOUTH ONCE DAILY FOR ANXIETY 90 capsule 2   HYDROcodone-acetaminophen (NORCO/VICODIN) 5-325 MG tablet Take 1 tablet by mouth every 6 (six) hours as needed.     Lancets Misc. (ACCU-CHEK FASTCLIX LANCET) KIT Use as directed to test  blood sugar daily 1 kit 0   levothyroxine (SYNTHROID) 75 MCG tablet TAKE 1 TABLET BY MOUTH IN THE MORNING ON  AN  EMPTY  STOMACH  WITH  WATER  ONLY  NO  FOOD  OR  OTHER  MEDICATIONS  FOR  30  MINUTES 90 tablet 3   LINZESS 145 MCG CAPS capsule Take 1 capsule by mouth at bedtime 30 capsule 0   oxybutynin (DITROPAN-XL) 10 MG 24 hr tablet Take 10 mg by mouth at bedtime.     rosuvastatin (CRESTOR) 20 MG tablet Take 1 tablet (20 mg total) by mouth daily. 90 tablet 3   traMADol (ULTRAM) 50 MG tablet Take 50 mg by mouth every 4 (four) hours as needed.     azithromycin (ZITHROMAX) 250 MG tablet Take 2 tablets by mouth today, then 1 tablet daily for 4 additional days. (Patient not taking: Reported on 08/26/2022) 6 tablet 0   benzonatate (TESSALON) 200 MG capsule Take 1 capsule (200 mg total) by mouth 3 (three) times daily as needed for cough. (Patient not taking: Reported on 08/26/2022) 15 capsule 0   chlorpheniramine-HYDROcodone (TUSSIONEX) 10-8 MG/5ML Take 5 mLs by mouth every 12 (twelve) hours as needed for cough. (Patient not taking: Reported on 08/26/2022) 50 mL 0   docusate sodium (COLACE) 100 MG capsule Take 1 capsule (100 mg total) by mouth daily as needed for up to 30 doses. (Patient not taking: Reported on 08/26/2022) 30 capsule 0   glucose blood (ACCU-CHEK GUIDE) test strip USE ONE STRIP THREE TIMES DAILY (Patient not taking: Reported on 06/16/2022) 300 each 1    hydrOXYzine (ATARAX/VISTARIL) 10 MG tablet Take 1 to 2 tablets by mouth twice daily as needed for anxiety. (Patient not taking: Reported on 08/26/2022) 30 tablet 0   metoprolol tartrate (LOPRESSOR) 25 MG tablet TAKE 1 TABLET BY MOUTH TWICE DAILY . 180 tablet 3   oxybutynin (DITROPAN-XL) 5 MG 24 hr tablet Take 1 tablet by mouth once daily (Patient not taking: Reported on 07/01/2022) 30 tablet 0   tamsulosin (FLOMAX) 0.4 MG CAPS capsule Take 1 capsule (0.4 mg total) by mouth daily. As needed for kidney stone (Patient not taking: Reported on 06/16/2022) 60 capsule 0   No current facility-administered medications on file prior to visit.    BP 104/62   Pulse 70   Temp (!) 97.4 F (36.3 C) (Temporal)   Ht 5\' 2"  (1.575 m)   Wt 187 lb (84.8 kg)   LMP 08/12/2015 Comment: patient states hx. tubal ligation  SpO2 98%   BMI 34.20 kg/m  Objective:   Physical Exam Constitutional:      General: She is not in acute distress. Cardiovascular:     Rate and Rhythm: Normal rate and regular rhythm.  Pulmonary:     Effort: Pulmonary effort is normal.  Abdominal:     Palpations: Abdomen is soft.     Tenderness: There is abdominal tenderness in the right upper quadrant. There is no guarding. Negative signs include Murphy's sign.             Assessment & Plan:  Right upper quadrant abdominal pain Assessment & Plan: Symptoms suspicious for gall bladder involvement.  Alpha gal allergy testing negative in May 2023, she declines further testing. Reviewed CMP from March 2024 which are mostly within normal range.  [place] of RUQ ordered and pending.  Discussed bland diet.  Orders: -     US ABDOMEN LIMITED RUQ (LIVER/GB); Future  Nausea -  Ondansetron; Take 1 tablet (4 mg total) by mouth every 8 (eight) hours as needed for nausea or vomiting.  Dispense: 15 tablet; Refill: 0        Doreene Nest, NP

## 2022-08-26 NOTE — Patient Instructions (Signed)
You will receive a phone call regarding the ultrasound of your gall bladder.  Do not eat or drink anything until you receive a phone call about the appointment.  I sent refills of Zofran to your pharmacy.  It was a pleasure to see you today!

## 2022-08-26 NOTE — Assessment & Plan Note (Signed)
Symptoms suspicious for gall bladder involvement.  Alpha gal allergy testing negative in May 2023, she declines further testing. Reviewed CMP from March 2024 which are mostly within normal range.  [place] of RUQ ordered and pending.  Discussed bland diet.

## 2022-08-26 NOTE — Telephone Encounter (Signed)
Left voice message for patient to give me a call back regarding order for ultrasound.   Patient has to be npo atleast 8 hrs prior to appt.  Patient is off everyday at 3:30pm.  I need to figure out a good time that works for patient to get ultrasound done without messing up her schedule.

## 2022-08-27 DIAGNOSIS — E039 Hypothyroidism, unspecified: Secondary | ICD-10-CM

## 2022-09-02 ENCOUNTER — Ambulatory Visit
Admission: RE | Admit: 2022-09-02 | Discharge: 2022-09-02 | Disposition: A | Discharge status: Home / Self Care | Payer: 59 | Source: Ambulatory Visit | Attending: Primary Care | Admitting: Primary Care

## 2022-09-02 DIAGNOSIS — R1011 Right upper quadrant pain: Secondary | ICD-10-CM

## 2022-09-14 ENCOUNTER — Encounter
Admission: RE | Admit: 2022-09-14 | Discharge: 2022-09-14 | Disposition: A | Discharge status: Home / Self Care | Payer: 59 | Source: Ambulatory Visit | Attending: Primary Care | Admitting: Primary Care

## 2022-09-14 DIAGNOSIS — R1011 Right upper quadrant pain: Secondary | ICD-10-CM | POA: Diagnosis present

## 2022-09-14 MED ORDER — TECHNETIUM TC 99M MEBROFENIN IV KIT
5.4800 | PACK | Freq: Once | INTRAVENOUS | Status: AC | PRN
  Administered 2022-09-14: 5.48 via INTRAVENOUS

## 2022-09-15 DIAGNOSIS — K811 Chronic cholecystitis: Secondary | ICD-10-CM

## 2022-09-19 NOTE — Progress Notes (Deleted)
Patient ID: Abigail [name], female   DOB: 11/05/1965, 57 y.o.   MRN: [id number]  Chief Complaint:  ***  History of Present Illness Abigail [name] is a 57 y.o. female with ***.  Past Medical History Past Medical History:  Diagnosis Date   Anxiety    Bulging lumbar disc    CAP (community acquired pneumonia)    Cardiac arrhythmia due to congenital heart disease    no arrhythmia identified    Depression    Diverticulosis    Sigmoid colon   Fatty liver 05/2016   Frequent headaches    GERD (gastroesophageal reflux disease)    Heart disease    Hypertension    Kidney stones    Migraine    Obesity (BMI 35.0-39.9 without comorbidity)    Thyroid disease    UTI (urinary tract infection)       Past Surgical History:  Procedure Laterality Date   ABDOMINAL EXPLORATION SURGERY  1986   COLONOSCOPY  06/2016   CYSTOSCOPY/URETEROSCOPY/HOLMIUM LASER/STENT PLACEMENT Right 05/16/2021   Procedure: CYSTOSCOPY/URETEROSCOPY/HOLMIUM LASER/STENT PLACEMENT;  Surgeon: Jannifer Hick, MD;  Location: [facility];  Service: Urology;  Laterality: Right;   ESOPHAGEAL MANOMETRY N/A 12/06/2019   Procedure: ESOPHAGEAL MANOMETRY (EM);  Surgeon: Napoleon Form, MD;  Location: [facility];  Service: Endoscopy;  Laterality: N/A;   EXTRACORPOREAL SHOCK WAVE LITHOTRIPSY Left 12/03/2016   Procedure: LEFT EXTRACORPOREAL SHOCK WAVE LITHOTRIPSY (ESWL);  Surgeon: Bjorn Pippin, MD;  Location: [facility];  Service: Urology;  Laterality: Left;   KIDNEY STONE SURGERY     LEFT HEART CATH AND CORONARY ANGIOGRAPHY  07/28/2016    Large tortuous coronary arteries no radiographic evidence of disease.  Not anginal chest pain.  Normal EF.   LITHOTRIPSY     NASAL SINUS SURGERY     ROTATOR CUFF REPAIR Right 12/27/2017   TRANSTHORACIC ECHOCARDIOGRAM  06/2017   EF 60-65%.  GR 1-2 DD.  Otherwise normal echo.  Normal valve function.  Normal wall motion.   TUBAL LIGATION      Allergies  Allergen Reactions   Latex Rash    Penicillins Nausea Only    Has patient had a PCN reaction causing immediate rash, facial/tongue/throat swelling, SOB or lightheadedness with hypotension: No Has patient had a PCN reaction causing severe rash involving mucus membranes or skin necrosis: No Has patient had a PCN reaction that required hospitalization: No Has patient had a PCN reaction occurring within the last 10 years: No If all of the above answers are "NO", then may proceed with Cephalosporin use.     Current Outpatient Medications  Medication Sig Dispense Refill   Accu-Chek FastClix Lancets MISC USE 1 LANCET TO CHECK BLOOD SUGAR THREE TIMES DAILY 300 each 1   ADVIL 200 MG tablet Take 200-600 mg by mouth every 6 (six) hours as needed for mild pain or headache.     albuterol (PROAIR HFA) 108 (90 Base) MCG/ACT inhaler Inhale 2 puffs into the lungs every 6 (six) hours as needed for wheezing or shortness of breath. 8 g 0   Aspirin-Caffeine (BAYER BACK & BODY PO) Take 1 tablet by mouth daily as needed (body pain).     azithromycin (ZITHROMAX) 250 MG tablet Take 2 tablets by mouth today, then 1 tablet daily for 4 additional days. (Patient not taking: Reported on 08/26/2022) 6 tablet 0   benzonatate (TESSALON) 200 MG capsule Take 1 capsule (200 mg total) by mouth 3 (three) times daily as needed for cough. (Patient not taking: Reported  on 08/26/2022) 15 capsule 0   Blood Glucose Monitoring Suppl (ACCU-CHEK GUIDE) w/Device KIT by Does not apply route.     chlorpheniramine-HYDROcodone (TUSSIONEX) 10-8 MG/5ML Take 5 mLs by mouth every 12 (twelve) hours as needed for cough. (Patient not taking: Reported on 08/26/2022) 50 mL 0   Cholecalciferol (VITAMIN D3) 1.25 MG (50000 UT) CAPS Take 1 capsule by mouth once a week 12 capsule 0   clotrimazole (LOTRIMIN) 1 % cream Apply to affected area 2 times daily 15 g 0   docusate sodium (COLACE) 100 MG capsule Take 1 capsule (100 mg total) by mouth daily as needed for up to 30 doses. (Patient not taking:  Reported on 08/26/2022) 30 capsule 0   esomeprazole (NEXIUM) 40 MG capsule TAKE 1 CAPSULE BY MOUTH DAILY 90 capsule 1   FLUoxetine (PROZAC) 40 MG capsule TAKE 1 CAPSULE BY MOUTH ONCE DAILY FOR ANXIETY 90 capsule 2   glucose blood (ACCU-CHEK GUIDE) test strip USE ONE STRIP THREE TIMES DAILY (Patient not taking: Reported on 06/16/2022) 300 each 1   HYDROcodone-acetaminophen (NORCO/VICODIN) 5-325 MG tablet Take 1 tablet by mouth every 6 (six) hours as needed.     hydrOXYzine (ATARAX/VISTARIL) 10 MG tablet Take 1 to 2 tablets by mouth twice daily as needed for anxiety. (Patient not taking: Reported on 08/26/2022) 30 tablet 0   Lancets Misc. (ACCU-CHEK FASTCLIX LANCET) KIT Use as directed to test blood sugar daily 1 kit 0   levothyroxine (SYNTHROID) 75 MCG tablet TAKE 1 TABLET BY MOUTH IN THE MORNING ON  AN  EMPTY  STOMACH  WITH  WATER  ONLY  NO  FOOD  OR  OTHER  MEDICATIONS  FOR  30  MINUTES 90 tablet 3   LINZESS 145 MCG CAPS capsule Take 1 capsule by mouth at bedtime 30 capsule 0   metoprolol tartrate (LOPRESSOR) 25 MG tablet TAKE 1 TABLET BY MOUTH TWICE DAILY . 180 tablet 3   ondansetron (ZOFRAN-ODT) 4 MG disintegrating tablet Take 1 tablet (4 mg total) by mouth every 8 (eight) hours as needed for nausea or vomiting. 15 tablet 0   oxybutynin (DITROPAN-XL) 10 MG 24 hr tablet Take 10 mg by mouth at bedtime.     oxybutynin (DITROPAN-XL) 5 MG 24 hr tablet Take 1 tablet by mouth once daily (Patient not taking: Reported on 07/01/2022) 30 tablet 0   rosuvastatin (CRESTOR) 20 MG tablet Take 1 tablet (20 mg total) by mouth daily. 90 tablet 3   tamsulosin (FLOMAX) 0.4 MG CAPS capsule Take 1 capsule (0.4 mg total) by mouth daily. As needed for kidney stone (Patient not taking: Reported on 06/16/2022) 60 capsule 0   traMADol (ULTRAM) 50 MG tablet Take 50 mg by mouth every 4 (four) hours as needed.     No current facility-administered medications for this visit.    Family History Family History  Problem Relation Age  of Onset   Gallstones Mother    COPD Father    Emphysema Father    Colon cancer Neg Hx    Stomach cancer Neg Hx    Esophageal cancer Neg Hx    Pancreatic cancer Neg Hx    Liver disease Neg Hx    Colon polyps Neg Hx    Rectal cancer Neg Hx       Social History Social History   Tobacco Use   Smoking status: Former    Current packs/day: 0.00    Types: Cigarettes    Quit date: 2008    Years since  quitting: 16.5   Smokeless tobacco: Never   Tobacco comments:    used tobacco "socially"  Vaping Use   Vaping status: Never Used  Substance Use Topics   Alcohol use: No    Alcohol/week: 0.0 standard drinks of alcohol   Drug use: No        ROS   Physical Exam Last menstrual period 08/12/2015.   CONSTITUTIONAL: Well developed, and nourished, appropriately responsive and aware without distress. ***  EYES: Sclera non-icteric.   EARS, NOSE, MOUTH AND THROAT: Mask worn.  *** The oropharynx is clear. Oral mucosa is pink and moist.  Dentition: ***   Hearing is intact to voice.  NECK: Trachea is midline, and there is no jugular venous distension.  LYMPH NODES:  Lymph nodes in the neck are not appreciated. RESPIRATORY:  Lungs are clear, and breath sounds are equal bilaterally. *** Normal respiratory effort without pathologic use of accessory muscles. CARDIOVASCULAR: Heart is regular in rate and rhythm.  *** Well perfused.  GI: The abdomen is *** soft, nontender, and nondistended. There were no palpable masses. *** I did not appreciate hepatosplenomegaly. There were normal bowel sounds.  *** GU: *** MUSCULOSKELETAL:  Symmetrical muscle tone appreciated in all four extremities.    SKIN: Skin turgor is normal. No pathologic skin lesions appreciated.  NEUROLOGIC:  Motor and sensation appear grossly normal.  Cranial nerves are grossly without defect. PSYCH:  Alert and oriented to person, place and time. Affect is appropriate for situation.  Data Reviewed I have personally reviewed what  is currently available of the patient's imaging, recent labs and medical records.   Labs:     Latest Ref Rng & Units 03/06/2022    5:25 PM 07/21/2021    1:59 AM 07/20/2021   12:41 AM  CBC  WBC 4.0 - 10.5 K/uL 9.0  6.4  5.9   Hemoglobin 12.0 - 15.0 g/dL 13.2  44.0  10.2   Hematocrit 36.0 - 46.0 % 40.5  35.3  35.9   Platelets 150 - 400 K/uL 253  207  197       Latest Ref Rng & Units 05/29/2022    3:01 PM 03/06/2022    5:25 PM 07/23/2021    8:11 AM  CMP  Glucose 65 - 99 mg/dL 79  99  88   BUN 7 - 25 mg/dL 12  9  8    Creatinine 0.50 - 1.03 mg/dL 7.25  3.66  4.40   Sodium 135 - 146 mmol/L 140  138  141   Potassium 3.5 - 5.3 mmol/L 4.6  3.8  4.1   Chloride 98 - 110 mmol/L 103  106  104   CO2 20 - 32 mmol/L 27  25  31    Calcium 8.6 - 10.4 mg/dL 9.3  9.0  9.5   Total Protein 6.1 - 8.1 g/dL 6.5     Total Bilirubin 0.2 - 1.2 mg/dL 1.2     AST 10 - 35 U/L 22     ALT 6 - 29 U/L 30      *** {Labs :18171}  Imaging: CLINICAL DATA:  Right upper quadrant pain and nausea since last November.   EXAM: ULTRASOUND ABDOMEN LIMITED RIGHT UPPER QUADRANT   COMPARISON:  CT abdomen pelvis dated Jul 24, 2022.   FINDINGS: Gallbladder:   No gallstones or wall thickening visualized. No sonographic Murphy sign noted by sonographer.   Common bile duct:   Diameter: 3 mm, normal.   Liver:   No new focal  lesion identified. Unchanged 8 mm cyst in left lobe. Mildly increased in parenchymal echogenicity. Portal vein is patent on color Doppler imaging with normal direction of blood flow towards the liver.   Other: None.   IMPRESSION: 1. No acute abnormality. 2. Mildly increased hepatic parenchymal echogenicity, nonspecific, but suggestive of steatosis.     Electronically Signed   By: Obie Dredge M.D.   On: 09/02/2022 09:50   CLINICAL DATA:  Right upper quadrant abdominal pain.  EXAM: NUCLEAR MEDICINE HEPATOBILIARY IMAGING WITH GALLBLADDER EF   TECHNIQUE: Sequential images of the  abdomen were obtained out to 60 minutes following intravenous administration of radiopharmaceutical. After oral ingestion of Ensure, gallbladder ejection fraction was determined. At 60 min, normal ejection fraction is greater than 33%.   RADIOPHARMACEUTICALS:  5.48 mCi Tc-55m  Choletec IV   COMPARISON:  Ultrasound September 02, 2022   FINDINGS: Prompt uptake and biliary excretion of activity by the liver is seen. Gallbladder activity is visualized, consistent with patency of cystic duct. Biliary activity passes into small bowel, consistent with patent common bile duct.   Calculated gallbladder ejection fraction is 28%. (Normal gallbladder ejection fraction with Ensure is greater than 33%.)   IMPRESSION: Reduced gallbladder ejection fraction as can be seen with chronic cholecystitis/biliary dyskinesia.     Electronically Signed   By: Maudry Mayhew M.D.   On: 09/14/2022 16:37 Assessment    *** Patient Active Problem List   Diagnosis Date Noted   Celiac disease 06/16/2022   Pleurisy without effusion 03/13/2022   Pulmonary nodules 03/13/2022   Acute cough 09/17/2021   Hypomagnesemia 07/19/2021   Hypokalemia 07/19/2021   Renal stones 05/23/2021   Fatigue 11/22/2020   Incomplete bladder emptying 07/19/2020   Preventative health care 05/29/2020   Flank pain 05/29/2020   Overactive bladder 05/29/2020   Chronic constipation 05/29/2020   Vitamin D deficiency 05/29/2020   Frequent headaches 07/04/2019   Right upper quadrant abdominal pain 11/29/2018   Controlled type 2 diabetes mellitus with hyperglycemia (HCC) 11/29/2018   Hyperlipidemia associated with type 2 diabetes mellitus (HCC) 08/05/2017   PVC's (premature ventricular contractions) 06/23/2017   Hypothyroidism 09/17/2016   Allergic rhinitis 06/06/2015   Palpitations 04/11/2015   Neck pain 06/29/2014   Chronic back pain 06/29/2014   Esophageal reflux 06/29/2014   Moderate obesity 05/23/2013   Essentially normal  coronaries per cath in 2012 05/23/2013   Suspected sleep apnea 05/23/2013   Anxiety and depression 05/23/2013   History of medication noncompliance 05/23/2013    Plan    ***  Face-to-face time spent with the patient and accompanying care providers(if present) was *** minutes, with more than 50% of the time spent counseling, educating, and coordinating care of the patient.    These notes generated with voice recognition software. I apologize for typographical errors.  Campbell Lerner M.D., FACS 09/19/2022, 1:27 PM

## 2022-09-20 ENCOUNTER — Other Ambulatory Visit: Payer: Self-pay | Admitting: Gastroenterology

## 2022-09-22 ENCOUNTER — Ambulatory Visit: Payer: 59 | Admitting: Surgery

## 2022-09-24 ENCOUNTER — Ambulatory Visit: Payer: 59 | Admitting: Surgery

## 2022-10-01 ENCOUNTER — Other Ambulatory Visit: Payer: Self-pay | Admitting: Primary Care

## 2022-10-01 DIAGNOSIS — E559 Vitamin D deficiency, unspecified: Secondary | ICD-10-CM

## 2022-10-05 DIAGNOSIS — R109 Unspecified abdominal pain: Secondary | ICD-10-CM

## 2022-10-05 DIAGNOSIS — R110 Nausea: Secondary | ICD-10-CM

## 2022-10-05 MED ORDER — ONDANSETRON 4 MG PO TBDP
4.0000 mg | ORAL_TABLET | Freq: Three times a day (TID) | ORAL | 0 refills | Status: DC | PRN
Start: 2022-10-05 — End: 2022-10-23

## 2022-10-05 MED ORDER — TAMSULOSIN HCL 0.4 MG PO CAPS
0.4000 mg | ORAL_CAPSULE | Freq: Every day | ORAL | 0 refills | Status: DC
Start: 2022-10-05 — End: 2022-11-24

## 2022-10-05 NOTE — H&P (View-Only) (Signed)
Patient ID: Abigail [name], female   DOB: 21-Dec-1965, 57 y.o.   MRN: [id number]  Chief Complaint: Long history of right upper quadrant pain, nausea.  History of Present Illness Abigail [name] is a 57 y.o. female with a fairly constant presence of a right upper quadrant pain and nausea, often worst following significant meals.  She has learned to avoid fatty foods and fried foods, but has not had significant or lingering improvement.  She reports she has constipation.  She has been a little frustrated at not coming to a conclusion that would help her.  She reports she had a HIDA scan to evaluate for biliary dyskinesia, although the ejection fraction was below normal, she reports having no symptoms during the HIDA scan.  She is quite convinced that her gallbladder is to blame considering the history she has heard from others around her.  She had lost weight which was gained back, to this point but is still down compared to what she was when she was at her heaviest.  Past Medical History Past Medical History:  Diagnosis Date   Anxiety    Bulging lumbar disc    CAP (community acquired pneumonia)    Cardiac arrhythmia due to congenital heart disease    no arrhythmia identified    Depression    Diverticulosis    Sigmoid colon   Fatty liver 05/2016   Frequent headaches    GERD (gastroesophageal reflux disease)    Heart disease    Hypertension    Kidney stones    Migraine    Obesity (BMI 35.0-39.9 without comorbidity)    Thyroid disease    UTI (urinary tract infection)       Past Surgical History:  Procedure Laterality Date   ABDOMINAL EXPLORATION SURGERY  1986   COLONOSCOPY  06/2016   CYSTOSCOPY/URETEROSCOPY/HOLMIUM LASER/STENT PLACEMENT Right 05/16/2021   Procedure: CYSTOSCOPY/URETEROSCOPY/HOLMIUM LASER/STENT PLACEMENT;  Surgeon: Jannifer Hick, MD;  Location: [facility];  Service: Urology;  Laterality: Right;   ESOPHAGEAL MANOMETRY N/A 12/06/2019   Procedure: ESOPHAGEAL MANOMETRY  (EM);  Surgeon: Napoleon Form, MD;  Location: [facility];  Service: Endoscopy;  Laterality: N/A;   EXTRACORPOREAL SHOCK WAVE LITHOTRIPSY Left 12/03/2016   Procedure: LEFT EXTRACORPOREAL SHOCK WAVE LITHOTRIPSY (ESWL);  Surgeon: Bjorn Pippin, MD;  Location: [facility];  Service: Urology;  Laterality: Left;   KIDNEY STONE SURGERY     LEFT HEART CATH AND CORONARY ANGIOGRAPHY  07/28/2016    Large tortuous coronary arteries no radiographic evidence of disease.  Not anginal chest pain.  Normal EF.   LITHOTRIPSY     NASAL SINUS SURGERY     ROTATOR CUFF REPAIR Right 12/27/2017   TRANSTHORACIC ECHOCARDIOGRAM  06/2017   EF 60-65%.  GR 1-2 DD.  Otherwise normal echo.  Normal valve function.  Normal wall motion.   TUBAL LIGATION      Allergies  Allergen Reactions   Latex Rash   Penicillins Nausea Only    Has patient had a PCN reaction causing immediate rash, facial/tongue/throat swelling, SOB or lightheadedness with hypotension: No Has patient had a PCN reaction causing severe rash involving mucus membranes or skin necrosis: No Has patient had a PCN reaction that required hospitalization: No Has patient had a PCN reaction occurring within the last 10 years: No If all of the above answers are "NO", then may proceed with Cephalosporin use.     Current Outpatient Medications  Medication Sig Dispense Refill   Accu-Chek FastClix Lancets MISC USE 1 LANCET TO  CHECK BLOOD SUGAR THREE TIMES DAILY 300 each 1   ADVIL 200 MG tablet Take 200-600 mg by mouth every 6 (six) hours as needed for mild pain or headache.     albuterol (PROAIR HFA) 108 (90 Base) MCG/ACT inhaler Inhale 2 puffs into the lungs every 6 (six) hours as needed for wheezing or shortness of breath. 8 g 0   Aspirin-Caffeine (BAYER BACK & BODY PO) Take 1 tablet by mouth daily as needed (body pain).     Blood Glucose Monitoring Suppl (ACCU-CHEK GUIDE) w/Device KIT by Does not apply route.     Cholecalciferol (VITAMIN D3) 1.25 MG (50000 UT) CAPS  Take 1 capsule by mouth once a week 12 capsule 0   clotrimazole (LOTRIMIN) 1 % cream Apply to affected area 2 times daily 15 g 0   esomeprazole (NEXIUM) 40 MG capsule TAKE 1 CAPSULE BY MOUTH DAILY 90 capsule 1   FLUoxetine (PROZAC) 40 MG capsule TAKE 1 CAPSULE BY MOUTH ONCE DAILY FOR ANXIETY 90 capsule 2   HYDROcodone-acetaminophen (NORCO/VICODIN) 5-325 MG tablet Take 1 tablet by mouth every 6 (six) hours as needed.     Lancets Misc. (ACCU-CHEK FASTCLIX LANCET) KIT Use as directed to test blood sugar daily 1 kit 0   levothyroxine (SYNTHROID) 75 MCG tablet TAKE 1 TABLET BY MOUTH IN THE MORNING ON  AN  EMPTY  STOMACH  WITH  WATER  ONLY  NO  FOOD  OR  OTHER  MEDICATIONS  FOR  30  MINUTES 90 tablet 3   LINZESS 145 MCG CAPS capsule Take 1 capsule by mouth at bedtime 30 capsule 0   metoprolol tartrate (LOPRESSOR) 25 MG tablet TAKE 1 TABLET BY MOUTH TWICE DAILY . 180 tablet 3   ondansetron (ZOFRAN-ODT) 4 MG disintegrating tablet Take 1 tablet (4 mg total) by mouth every 8 (eight) hours as needed for nausea or vomiting. 15 tablet 0   oxybutynin (DITROPAN-XL) 10 MG 24 hr tablet Take 10 mg by mouth at bedtime.     rosuvastatin (CRESTOR) 20 MG tablet Take 1 tablet (20 mg total) by mouth daily. 90 tablet 3   tamsulosin (FLOMAX) 0.4 MG CAPS capsule Take 1 capsule (0.4 mg total) by mouth daily. As needed for kidney stone 30 capsule 0   traMADol (ULTRAM) 50 MG tablet Take 50 mg by mouth every 4 (four) hours as needed.     No current facility-administered medications for this visit.    Family History Family History  Problem Relation Age of Onset   Gallstones Mother    COPD Father    Emphysema Father    Colon cancer Neg Hx    Stomach cancer Neg Hx    Esophageal cancer Neg Hx    Pancreatic cancer Neg Hx    Liver disease Neg Hx    Colon polyps Neg Hx    Rectal cancer Neg Hx       Social History Social History   Tobacco Use   Smoking status: Former    Current packs/day: 0.00    Types: Cigarettes     Quit date: 2008    Years since quitting: 16.5   Smokeless tobacco: Never   Tobacco comments:    used tobacco "socially"  Vaping Use   Vaping status: Never Used  Substance Use Topics   Alcohol use: No    Alcohol/week: 0.0 standard drinks of alcohol   Drug use: No        Review of Systems  Constitutional:  Positive for diaphoresis  and malaise/fatigue.  HENT: Negative.    Eyes: Negative.   Respiratory:  Positive for cough.   Cardiovascular:  Positive for palpitations and leg swelling.  Gastrointestinal:  Positive for abdominal pain, constipation, diarrhea, heartburn and nausea. Negative for blood in stool, melena and vomiting.  Genitourinary:  Negative for dysuria, frequency, hematuria and urgency.       Some incontinence.   Skin: Negative.   Neurological:  Positive for headaches.  Psychiatric/Behavioral:  Positive for depression.      Physical Exam Blood pressure (!) 122/58, pulse 74, temperature 98.4 F (36.9 C), temperature source Oral, height 5\' 2"  (1.575 m), weight 188 lb 12.8 oz (85.6 kg), last menstrual period 08/12/2015, SpO2 96%. Last Weight  Most recent update: 10/06/2022  4:14 PM    Weight  85.6 kg (188 lb 12.8 oz)             CONSTITUTIONAL: Well developed, and nourished, appropriately responsive and aware without distress.   EYES: Sclera non-icteric.   EARS, NOSE, MOUTH AND THROAT:  The oropharynx is clear. Oral mucosa is pink and moist.     Hearing is intact to voice.  NECK: Trachea is midline, and there is no jugular venous distension.  LYMPH NODES:  Lymph nodes in the neck are not appreciated. RESPIRATORY:  Lungs are clear, and breath sounds are equal bilaterally.  Normal respiratory effort without pathologic use of accessory muscles. CARDIOVASCULAR: Heart is regular in rate and rhythm.   Well perfused.  GI: The abdomen is  soft, nontender, and nondistended. There were no palpable masses.  I did not appreciate hepatosplenomegaly.  MUSCULOSKELETAL:   Symmetrical muscle tone appreciated in all four extremities.    SKIN: Skin turgor is normal. No pathologic skin lesions appreciated.  NEUROLOGIC:  Motor and sensation appear grossly normal.  Cranial nerves are grossly without defect. PSYCH:  Alert and oriented to person, place and time. Affect is appropriate for situation.  Data Reviewed I have personally reviewed what is currently available of the patient's imaging, recent labs and medical records.   Labs:     Latest Ref Rng & Units 03/06/2022    5:25 PM 07/21/2021    1:59 AM 07/20/2021   12:41 AM  CBC  WBC 4.0 - 10.5 K/uL 9.0  6.4  5.9   Hemoglobin 12.0 - 15.0 g/dL 40.9  81.1  91.4   Hematocrit 36.0 - 46.0 % 40.5  35.3  35.9   Platelets 150 - 400 K/uL 253  207  197       Latest Ref Rng & Units 05/29/2022    3:01 PM 03/06/2022    5:25 PM 07/23/2021    8:11 AM  CMP  Glucose 65 - 99 mg/dL 79  99  88   BUN 7 - 25 mg/dL 12  9  8    Creatinine 0.50 - 1.03 mg/dL 7.82  9.56  2.13   Sodium 135 - 146 mmol/L 140  138  141   Potassium 3.5 - 5.3 mmol/L 4.6  3.8  4.1   Chloride 98 - 110 mmol/L 103  106  104   CO2 20 - 32 mmol/L 27  25  31    Calcium 8.6 - 10.4 mg/dL 9.3  9.0  9.5   Total Protein 6.1 - 8.1 g/dL 6.5     Total Bilirubin 0.2 - 1.2 mg/dL 1.2     AST 10 - 35 U/L 22     ALT 6 - 29 U/L 30  Imaging: CLINICAL DATA:  Right upper quadrant pain and nausea since last November.   EXAM: ULTRASOUND ABDOMEN LIMITED RIGHT UPPER QUADRANT   COMPARISON:  CT abdomen pelvis dated Jul 24, 2022.   FINDINGS: Gallbladder:   No gallstones or wall thickening visualized. No sonographic Murphy sign noted by sonographer.   Common bile duct:   Diameter: 3 mm, normal.   Liver:   No new focal lesion identified. Unchanged 8 mm cyst in left lobe. Mildly increased in parenchymal echogenicity. Portal vein is patent on color Doppler imaging with normal direction of blood flow towards the liver.   Other: None.   IMPRESSION: 1. No acute  abnormality. 2. Mildly increased hepatic parenchymal echogenicity, nonspecific, but suggestive of steatosis.     Electronically Signed   By: Obie Dredge M.D.   On: 09/02/2022 09:50   CLINICAL DATA:  Right upper quadrant abdominal pain.  EXAM: NUCLEAR MEDICINE HEPATOBILIARY IMAGING WITH GALLBLADDER EF   TECHNIQUE: Sequential images of the abdomen were obtained out to 60 minutes following intravenous administration of radiopharmaceutical. After oral ingestion of Ensure, gallbladder ejection fraction was determined. At 60 min, normal ejection fraction is greater than 33%.   RADIOPHARMACEUTICALS:  5.48 mCi Tc-71m  Choletec IV   COMPARISON:  Ultrasound September 02, 2022   FINDINGS: Prompt uptake and biliary excretion of activity by the liver is seen. Gallbladder activity is visualized, consistent with patency of cystic duct. Biliary activity passes into small bowel, consistent with patent common bile duct.   Calculated gallbladder ejection fraction is 28%. (Normal gallbladder ejection fraction with Ensure is greater than 33%.)   IMPRESSION: Reduced gallbladder ejection fraction as can be seen with chronic cholecystitis/biliary dyskinesia.     Electronically Signed   By: Maudry Mayhew M.D.   On: 09/14/2022 16:37 Assessment    Patient Active Problem List   Diagnosis Date Noted   Biliary dyskinesia 10/07/2022   Celiac disease 06/16/2022   Pleurisy without effusion 03/13/2022   Pulmonary nodules 03/13/2022   Acute cough 09/17/2021   Hypomagnesemia 07/19/2021   Hypokalemia 07/19/2021   Renal stones 05/23/2021   Fatigue 11/22/2020   Incomplete bladder emptying 07/19/2020   Preventative health care 05/29/2020   Flank pain 05/29/2020   Overactive bladder 05/29/2020   Chronic constipation 05/29/2020   Vitamin D deficiency 05/29/2020   Frequent headaches 07/04/2019   Right upper quadrant abdominal pain 11/29/2018   Controlled type 2 diabetes mellitus with hyperglycemia  (HCC) 11/29/2018   Hyperlipidemia associated with type 2 diabetes mellitus (HCC) 08/05/2017   PVC's (premature ventricular contractions) 06/23/2017   Hypothyroidism 09/17/2016   Allergic rhinitis 06/06/2015   Palpitations 04/11/2015   Neck pain 06/29/2014   Chronic back pain 06/29/2014   Esophageal reflux 06/29/2014   Moderate obesity 05/23/2013   Essentially normal coronaries per cath in 2012 05/23/2013   Suspected sleep apnea 05/23/2013   Anxiety and depression 05/23/2013   History of medication noncompliance 05/23/2013    Plan    Robotic cholecystectomy with ICG imaging.  This was discussed thoroughly.  Optimal plan is for robotic cholecystectomy utilizing ICG imaging. Risks and benefits have been discussed with the patient which include but are not limited to anesthesia, bleeding, infection, biliary ductal injury, resulting in leak or stenosis, other associated unanticipated injuries affiliated with laparoscopic surgery.   Reviewed that removing the gallbladder will only address the symptoms related to the gallbladder itself.  I believe there is the desire to proceed, accepting the risks with understanding.  Questions elicited and answered  to satisfaction.    No guarantees ever expressed or implied.   Face-to-face time spent with the patient and accompanying care providers(if present) was 45 minutes, with more than 50% of the time spent counseling, educating, and coordinating care of the patient.    These notes generated with voice recognition software. I apologize for typographical errors.  Campbell Lerner M.D., FACS 10/07/2022, 1:53 PM

## 2022-10-05 NOTE — Progress Notes (Unsigned)
Patient ID: Abigail [name], female   DOB: March 10, 1965, 57 y.o.   MRN: [id number]  Chief Complaint: Long history of right upper quadrant pain, nausea.  History of Present Illness Abigail [name] is a 57 y.o. female with a fairly constant presence of a right upper quadrant pain and nausea, often worst following significant meals.  She has learned to avoid fatty foods and fried foods, but has not had significant or lingering improvement.  She reports she has constipation.  She has been a little frustrated at not coming to a conclusion that would help her.  She reports she had a HIDA scan to evaluate for biliary dyskinesia, although the ejection fraction was below normal, she reports having no symptoms during the HIDA scan.  She is quite convinced that her gallbladder is to blame considering the history she has heard from others around her.  She had lost weight which was gained back, to this point but is still down compared to what she was when she was at her heaviest.  Past Medical History Past Medical History:  Diagnosis Date   Anxiety    Bulging lumbar disc    CAP (community acquired pneumonia)    Cardiac arrhythmia due to congenital heart disease    no arrhythmia identified    Depression    Diverticulosis    Sigmoid colon   Fatty liver 05/2016   Frequent headaches    GERD (gastroesophageal reflux disease)    Heart disease    Hypertension    Kidney stones    Migraine    Obesity (BMI 35.0-39.9 without comorbidity)    Thyroid disease    UTI (urinary tract infection)       Past Surgical History:  Procedure Laterality Date   ABDOMINAL EXPLORATION SURGERY  1986   COLONOSCOPY  06/2016   CYSTOSCOPY/URETEROSCOPY/HOLMIUM LASER/STENT PLACEMENT Right 05/16/2021   Procedure: CYSTOSCOPY/URETEROSCOPY/HOLMIUM LASER/STENT PLACEMENT;  Surgeon: Jannifer Hick, MD;  Location: [facility];  Service: Urology;  Laterality: Right;   ESOPHAGEAL MANOMETRY N/A 12/06/2019   Procedure: ESOPHAGEAL MANOMETRY  (EM);  Surgeon: Napoleon Form, MD;  Location: [facility];  Service: Endoscopy;  Laterality: N/A;   EXTRACORPOREAL SHOCK WAVE LITHOTRIPSY Left 12/03/2016   Procedure: LEFT EXTRACORPOREAL SHOCK WAVE LITHOTRIPSY (ESWL);  Surgeon: Bjorn Pippin, MD;  Location: [facility];  Service: Urology;  Laterality: Left;   KIDNEY STONE SURGERY     LEFT HEART CATH AND CORONARY ANGIOGRAPHY  07/28/2016    Large tortuous coronary arteries no radiographic evidence of disease.  Not anginal chest pain.  Normal EF.   LITHOTRIPSY     NASAL SINUS SURGERY     ROTATOR CUFF REPAIR Right 12/27/2017   TRANSTHORACIC ECHOCARDIOGRAM  06/2017   EF 60-65%.  GR 1-2 DD.  Otherwise normal echo.  Normal valve function.  Normal wall motion.   TUBAL LIGATION      Allergies  Allergen Reactions   Latex Rash   Penicillins Nausea Only    Has patient had a PCN reaction causing immediate rash, facial/tongue/throat swelling, SOB or lightheadedness with hypotension: No Has patient had a PCN reaction causing severe rash involving mucus membranes or skin necrosis: No Has patient had a PCN reaction that required hospitalization: No Has patient had a PCN reaction occurring within the last 10 years: No If all of the above answers are "NO", then may proceed with Cephalosporin use.     Current Outpatient Medications  Medication Sig Dispense Refill   Accu-Chek FastClix Lancets MISC USE 1 LANCET TO  CHECK BLOOD SUGAR THREE TIMES DAILY 300 each 1   ADVIL 200 MG tablet Take 200-600 mg by mouth every 6 (six) hours as needed for mild pain or headache.     albuterol (PROAIR HFA) 108 (90 Base) MCG/ACT inhaler Inhale 2 puffs into the lungs every 6 (six) hours as needed for wheezing or shortness of breath. 8 g 0   Aspirin-Caffeine (BAYER BACK & BODY PO) Take 1 tablet by mouth daily as needed (body pain).     Blood Glucose Monitoring Suppl (ACCU-CHEK GUIDE) w/Device KIT by Does not apply route.     Cholecalciferol (VITAMIN D3) 1.25 MG (50000 UT) CAPS  Take 1 capsule by mouth once a week 12 capsule 0   clotrimazole (LOTRIMIN) 1 % cream Apply to affected area 2 times daily 15 g 0   esomeprazole (NEXIUM) 40 MG capsule TAKE 1 CAPSULE BY MOUTH DAILY 90 capsule 1   FLUoxetine (PROZAC) 40 MG capsule TAKE 1 CAPSULE BY MOUTH ONCE DAILY FOR ANXIETY 90 capsule 2   HYDROcodone-acetaminophen (NORCO/VICODIN) 5-325 MG tablet Take 1 tablet by mouth every 6 (six) hours as needed.     Lancets Misc. (ACCU-CHEK FASTCLIX LANCET) KIT Use as directed to test blood sugar daily 1 kit 0   levothyroxine (SYNTHROID) 75 MCG tablet TAKE 1 TABLET BY MOUTH IN THE MORNING ON  AN  EMPTY  STOMACH  WITH  WATER  ONLY  NO  FOOD  OR  OTHER  MEDICATIONS  FOR  30  MINUTES 90 tablet 3   LINZESS 145 MCG CAPS capsule Take 1 capsule by mouth at bedtime 30 capsule 0   metoprolol tartrate (LOPRESSOR) 25 MG tablet TAKE 1 TABLET BY MOUTH TWICE DAILY . 180 tablet 3   ondansetron (ZOFRAN-ODT) 4 MG disintegrating tablet Take 1 tablet (4 mg total) by mouth every 8 (eight) hours as needed for nausea or vomiting. 15 tablet 0   oxybutynin (DITROPAN-XL) 10 MG 24 hr tablet Take 10 mg by mouth at bedtime.     rosuvastatin (CRESTOR) 20 MG tablet Take 1 tablet (20 mg total) by mouth daily. 90 tablet 3   tamsulosin (FLOMAX) 0.4 MG CAPS capsule Take 1 capsule (0.4 mg total) by mouth daily. As needed for kidney stone 30 capsule 0   traMADol (ULTRAM) 50 MG tablet Take 50 mg by mouth every 4 (four) hours as needed.     No current facility-administered medications for this visit.    Family History Family History  Problem Relation Age of Onset   Gallstones Mother    COPD Father    Emphysema Father    Colon cancer Neg Hx    Stomach cancer Neg Hx    Esophageal cancer Neg Hx    Pancreatic cancer Neg Hx    Liver disease Neg Hx    Colon polyps Neg Hx    Rectal cancer Neg Hx       Social History Social History   Tobacco Use   Smoking status: Former    Current packs/day: 0.00    Types: Cigarettes     Quit date: 2008    Years since quitting: 16.5   Smokeless tobacco: Never   Tobacco comments:    used tobacco "socially"  Vaping Use   Vaping status: Never Used  Substance Use Topics   Alcohol use: No    Alcohol/week: 0.0 standard drinks of alcohol   Drug use: No        Review of Systems  Constitutional:  Positive for diaphoresis  and malaise/fatigue.  HENT: Negative.    Eyes: Negative.   Respiratory:  Positive for cough.   Cardiovascular:  Positive for palpitations and leg swelling.  Gastrointestinal:  Positive for abdominal pain, constipation, diarrhea, heartburn and nausea. Negative for blood in stool, melena and vomiting.  Genitourinary:  Negative for dysuria, frequency, hematuria and urgency.       Some incontinence.   Skin: Negative.   Neurological:  Positive for headaches.  Psychiatric/Behavioral:  Positive for depression.      Physical Exam Blood pressure (!) 122/58, pulse 74, temperature 98.4 F (36.9 C), temperature source Oral, height 5\' 2"  (1.575 m), weight 188 lb 12.8 oz (85.6 kg), last menstrual period 08/12/2015, SpO2 96%. Last Weight  Most recent update: 10/06/2022  4:14 PM    Weight  85.6 kg (188 lb 12.8 oz)             CONSTITUTIONAL: Well developed, and nourished, appropriately responsive and aware without distress.   EYES: Sclera non-icteric.   EARS, NOSE, MOUTH AND THROAT:  The oropharynx is clear. Oral mucosa is pink and moist.     Hearing is intact to voice.  NECK: Trachea is midline, and there is no jugular venous distension.  LYMPH NODES:  Lymph nodes in the neck are not appreciated. RESPIRATORY:  Lungs are clear, and breath sounds are equal bilaterally.  Normal respiratory effort without pathologic use of accessory muscles. CARDIOVASCULAR: Heart is regular in rate and rhythm.   Well perfused.  GI: The abdomen is  soft, nontender, and nondistended. There were no palpable masses.  I did not appreciate hepatosplenomegaly.  MUSCULOSKELETAL:   Symmetrical muscle tone appreciated in all four extremities.    SKIN: Skin turgor is normal. No pathologic skin lesions appreciated.  NEUROLOGIC:  Motor and sensation appear grossly normal.  Cranial nerves are grossly without defect. PSYCH:  Alert and oriented to person, place and time. Affect is appropriate for situation.  Data Reviewed I have personally reviewed what is currently available of the patient's imaging, recent labs and medical records.   Labs:     Latest Ref Rng & Units 03/06/2022    5:25 PM 07/21/2021    1:59 AM 07/20/2021   12:41 AM  CBC  WBC 4.0 - 10.5 K/uL 9.0  6.4  5.9   Hemoglobin 12.0 - 15.0 g/dL 13.0  86.5  78.4   Hematocrit 36.0 - 46.0 % 40.5  35.3  35.9   Platelets 150 - 400 K/uL 253  207  197       Latest Ref Rng & Units 05/29/2022    3:01 PM 03/06/2022    5:25 PM 07/23/2021    8:11 AM  CMP  Glucose 65 - 99 mg/dL 79  99  88   BUN 7 - 25 mg/dL 12  9  8    Creatinine 0.50 - 1.03 mg/dL 6.96  2.95  2.84   Sodium 135 - 146 mmol/L 140  138  141   Potassium 3.5 - 5.3 mmol/L 4.6  3.8  4.1   Chloride 98 - 110 mmol/L 103  106  104   CO2 20 - 32 mmol/L 27  25  31    Calcium 8.6 - 10.4 mg/dL 9.3  9.0  9.5   Total Protein 6.1 - 8.1 g/dL 6.5     Total Bilirubin 0.2 - 1.2 mg/dL 1.2     AST 10 - 35 U/L 22     ALT 6 - 29 U/L 30  Imaging: CLINICAL DATA:  Right upper quadrant pain and nausea since last November.   EXAM: ULTRASOUND ABDOMEN LIMITED RIGHT UPPER QUADRANT   COMPARISON:  CT abdomen pelvis dated Jul 24, 2022.   FINDINGS: Gallbladder:   No gallstones or wall thickening visualized. No sonographic Murphy sign noted by sonographer.   Common bile duct:   Diameter: 3 mm, normal.   Liver:   No new focal lesion identified. Unchanged 8 mm cyst in left lobe. Mildly increased in parenchymal echogenicity. Portal vein is patent on color Doppler imaging with normal direction of blood flow towards the liver.   Other: None.   IMPRESSION: 1. No acute  abnormality. 2. Mildly increased hepatic parenchymal echogenicity, nonspecific, but suggestive of steatosis.     Electronically Signed   By: Obie Dredge M.D.   On: 09/02/2022 09:50   CLINICAL DATA:  Right upper quadrant abdominal pain.  EXAM: NUCLEAR MEDICINE HEPATOBILIARY IMAGING WITH GALLBLADDER EF   TECHNIQUE: Sequential images of the abdomen were obtained out to 60 minutes following intravenous administration of radiopharmaceutical. After oral ingestion of Ensure, gallbladder ejection fraction was determined. At 60 min, normal ejection fraction is greater than 33%.   RADIOPHARMACEUTICALS:  5.48 mCi Tc-58m  Choletec IV   COMPARISON:  Ultrasound September 02, 2022   FINDINGS: Prompt uptake and biliary excretion of activity by the liver is seen. Gallbladder activity is visualized, consistent with patency of cystic duct. Biliary activity passes into small bowel, consistent with patent common bile duct.   Calculated gallbladder ejection fraction is 28%. (Normal gallbladder ejection fraction with Ensure is greater than 33%.)   IMPRESSION: Reduced gallbladder ejection fraction as can be seen with chronic cholecystitis/biliary dyskinesia.     Electronically Signed   By: Maudry Mayhew M.D.   On: 09/14/2022 16:37 Assessment    Patient Active Problem List   Diagnosis Date Noted   Biliary dyskinesia 10/07/2022   Celiac disease 06/16/2022   Pleurisy without effusion 03/13/2022   Pulmonary nodules 03/13/2022   Acute cough 09/17/2021   Hypomagnesemia 07/19/2021   Hypokalemia 07/19/2021   Renal stones 05/23/2021   Fatigue 11/22/2020   Incomplete bladder emptying 07/19/2020   Preventative health care 05/29/2020   Flank pain 05/29/2020   Overactive bladder 05/29/2020   Chronic constipation 05/29/2020   Vitamin D deficiency 05/29/2020   Frequent headaches 07/04/2019   Right upper quadrant abdominal pain 11/29/2018   Controlled type 2 diabetes mellitus with hyperglycemia  (HCC) 11/29/2018   Hyperlipidemia associated with type 2 diabetes mellitus (HCC) 08/05/2017   PVC's (premature ventricular contractions) 06/23/2017   Hypothyroidism 09/17/2016   Allergic rhinitis 06/06/2015   Palpitations 04/11/2015   Neck pain 06/29/2014   Chronic back pain 06/29/2014   Esophageal reflux 06/29/2014   Moderate obesity 05/23/2013   Essentially normal coronaries per cath in 2012 05/23/2013   Suspected sleep apnea 05/23/2013   Anxiety and depression 05/23/2013   History of medication noncompliance 05/23/2013    Plan    Robotic cholecystectomy with ICG imaging.  This was discussed thoroughly.  Optimal plan is for robotic cholecystectomy utilizing ICG imaging. Risks and benefits have been discussed with the patient which include but are not limited to anesthesia, bleeding, infection, biliary ductal injury, resulting in leak or stenosis, other associated unanticipated injuries affiliated with laparoscopic surgery.   Reviewed that removing the gallbladder will only address the symptoms related to the gallbladder itself.  I believe there is the desire to proceed, accepting the risks with understanding.  Questions elicited and answered  to satisfaction.    No guarantees ever expressed or implied.   Face-to-face time spent with the patient and accompanying care providers(if present) was 45 minutes, with more than 50% of the time spent counseling, educating, and coordinating care of the patient.    These notes generated with voice recognition software. I apologize for typographical errors.  Campbell Lerner M.D., FACS 10/07/2022, 1:53 PM

## 2022-10-06 ENCOUNTER — Encounter: Payer: Self-pay | Admitting: Surgery

## 2022-10-06 ENCOUNTER — Ambulatory Visit: Payer: 59 | Admitting: Surgery

## 2022-10-06 VITALS — BP 122/58 | HR 74 | Temp 98.4°F | Ht 62.0 in | Wt 188.8 lb

## 2022-10-06 DIAGNOSIS — K828 Other specified diseases of gallbladder: Secondary | ICD-10-CM | POA: Diagnosis not present

## 2022-10-06 NOTE — Patient Instructions (Signed)
Our surgery scheduler Barbara will call you within 24-48 hours to get you scheduled. If you have not heard from her after 48 hours, please call our office. Have the blue sheet available when she calls to write down important information.   If you have any concerns or questions, please feel free to call our office.   Minimally Invasive Cholecystectomy Minimally invasive cholecystectomy is surgery to remove the gallbladder. The gallbladder is a pear-shaped organ that lies beneath the liver on the right side of the body. The gallbladder stores bile, which is a fluid that helps the body digest fats. Cholecystectomy is often done to treat inflammation (irritation and swelling) of the gallbladder (cholecystitis). This condition is usually caused by a buildup of gallstones (cholelithiasis) in the gallbladder or when the fluid in the gall bladder becomes stagnant because gallstones get stuck in the ducts (tubes) and block the flow of bile. This can result in inflammation and pain. In severe cases, emergency surgery may be required. This procedure is done through small incisions in the abdomen, instead of one large incision. It is also called laparoscopic surgery. A thin scope with a camera (laparoscope) is inserted through one incision. Then surgical instruments are inserted through the other incisions. In some cases, a minimally invasive surgery may need to be changed to a surgery that is done through a larger incision. This is called open surgery. Tell a health care provider about: Any allergies you have. All medicines you are taking, including vitamins, herbs, eye drops, creams, and over-the-counter medicines. Any problems you or family members have had with anesthetic medicines. Any bleeding problems you have. Any surgeries you have had. Any medical conditions you have. Whether you are pregnant or may be pregnant. What are the risks? Generally, this is a safe procedure. However, problems may occur,  including: Infection. Bleeding. Allergic reactions to medicines. Damage to nearby structures or organs. A gallstone remaining in the common bile duct. The common bile duct carries bile from the gallbladder to the small intestine. A bile leak from the liver or cystic duct after your gallbladder is removed. What happens before the procedure? When to stop eating and drinking Follow instructions from your health care provider about what you may eat and drink before your procedure. These may include: 8 hours before the procedure Stop eating most foods. Do not eat meat, fried foods, or fatty foods. Eat only light foods, such as toast or crackers. All liquids are okay except energy drinks and alcohol. 6 hours before the procedure Stop eating. Drink only clear liquids, such as water, clear fruit juice, black coffee, plain tea, and sports drinks. Do not drink energy drinks or alcohol. 2 hours before the procedure Stop drinking all liquids. You may be allowed to take medicines with small sips of water. If you do not follow your health care provider's instructions, your procedure may be delayed or canceled. Medicines Ask your health care provider about: Changing or stopping your regular medicines. This is especially important if you are taking diabetes medicines or blood thinners. Taking medicines such as aspirin and ibuprofen. These medicines can thin your blood. Do not take these medicines unless your health care provider tells you to take them. Taking over-the-counter medicines, vitamins, herbs, and supplements. General instructions If you will be going home right after the procedure, plan to have a responsible adult: Take you home from the hospital or clinic. You will not be allowed to drive. Care for you for the time you are told. Do   not use any products that contain nicotine or tobacco for at least 4 weeks before the procedure. These products include cigarettes, chewing tobacco, and vaping  devices, such as e-cigarettes. If you need help quitting, ask your health care provider. Ask your health care provider: How your surgery site will be marked. What steps will be taken to help prevent infection. These may include: Removing hair at the surgery site. Washing skin with a germ-killing soap. Taking antibiotic medicine. What happens during the procedure?  An IV will be inserted into one of your veins. You will be given one or both of the following: A medicine to help you relax (sedative). A medicine to make you fall asleep (general anesthetic). Your surgeon will make several small incisions in your abdomen. The laparoscope will be inserted through one of the small incisions. The camera on the laparoscope will send images to a monitor in the operating room. This lets your surgeon see inside your abdomen. A gas will be pumped into your abdomen. This will expand your abdomen to give the surgeon more room to perform the surgery. Other tools that are needed for the procedure will be inserted through the other incisions. The gallbladder will be removed through one of the incisions. Your common bile duct may be examined. If stones are found in the common bile duct, they may be removed. After your gallbladder has been removed, the incisions will be closed with stitches (sutures), staples, or skin glue. Your incisions will be covered with a bandage (dressing). The procedure may vary among health care providers and hospitals. What happens after the procedure? Your blood pressure, heart rate, breathing rate, and blood oxygen level will be monitored until you leave the hospital or clinic. You will be given medicines as needed to control your pain. You may have a drain placed in the incision. The drain will be removed a day or two after the procedure. Summary Minimally invasive cholecystectomy, also called laparoscopic cholecystectomy, is surgery to remove the gallbladder using small  incisions. Tell your health care provider about all the medical conditions you have and all the medicines you are taking for those conditions. Before the procedure, follow instructions about when to stop eating and drinking and changing or stopping medicines. Plan to have a responsible adult care for you for the time you are told after you leave the hospital or clinic. This information is not intended to replace advice given to you by your health care provider. Make sure you discuss any questions you have with your health care provider. Document Revised: 08/27/2020 Document Reviewed: 08/27/2020 Elsevier Patient Education  2024 [employer]  

## 2022-10-07 ENCOUNTER — Telehealth: Payer: Self-pay | Admitting: Surgery

## 2022-10-07 ENCOUNTER — Ambulatory Visit: Payer: Self-pay | Admitting: Surgery

## 2022-10-07 DIAGNOSIS — K828 Other specified diseases of gallbladder: Secondary | ICD-10-CM

## 2022-10-07 NOTE — Telephone Encounter (Signed)
Patient calls back, she is informed of all dates regarding surgery.

## 2022-10-07 NOTE — Telephone Encounter (Signed)
Left message for patient to call me to discuss surgery dates.

## 2022-10-07 NOTE — Telephone Encounter (Signed)
Left message for patient to call, please inform her of the following regarding scheduled surgery with Dr. Claudine Mouton.   Pre-Admission date/time, and Surgery date at [facility].  Surgery Date: 10/23/22 Preadmission Testing Date: 10/15/22 (phone 8a-1p)  Also patient will need to call at [phone], between 1-3:00pm the day before surgery, to find out what time to arrive for surgery.

## 2022-10-15 ENCOUNTER — Encounter
Admission: RE | Admit: 2022-10-15 | Discharge: 2022-10-15 | Disposition: A | Discharge status: Home / Self Care | Payer: 59 | Source: Ambulatory Visit | Attending: Surgery | Admitting: Surgery

## 2022-10-15 ENCOUNTER — Encounter: Payer: Self-pay | Admitting: Surgery

## 2022-10-15 ENCOUNTER — Other Ambulatory Visit: Payer: Self-pay

## 2022-10-15 DIAGNOSIS — E1165 Type 2 diabetes mellitus with hyperglycemia: Secondary | ICD-10-CM

## 2022-10-15 DIAGNOSIS — I10 Essential (primary) hypertension: Secondary | ICD-10-CM

## 2022-10-15 HISTORY — DX: Other specified diseases of gallbladder: K82.8

## 2022-10-15 HISTORY — DX: Angina pectoris, unspecified: I20.9

## 2022-10-15 HISTORY — DX: Ventricular premature depolarization: I49.3

## 2022-10-15 HISTORY — DX: Congenital malformation of heart, unspecified: Q24.9

## 2022-10-15 NOTE — Patient Instructions (Addendum)
Your procedure is scheduled on: 10/23/22 - Friday Report to the Registration Desk on the 1st floor of the [facility]. To find out your arrival time, please call [phone] between 1PM - 3PM on: 10/22/22 - Thursday If your arrival time is 6:00 am, do not arrive before that time as the [facility] entrance doors do not open until 6:00 am.  REMEMBER: Instructions that are not followed completely may result in serious medical risk, up to and including death; or upon the discretion of your surgeon and anesthesiologist your surgery may need to be rescheduled.  Do not eat food or drink any liquids after midnight the night before surgery.  No gum chewing or hard candies   One week prior to surgery: Stop Anti-inflammatories (NSAIDS) such as Advil, Aleve, Ibuprofen, Motrin, Naproxen, Naprosyn and Aspirin based products such as Excedrin, Goody's Powder, BC Powder.  Stop ANY OVER THE COUNTER supplements until after surgery.  You may however, continue to take Tylenol if needed for pain up until the day of surgery.  TAKE ONLY THESE MEDICATIONS THE MORNING OF SURGERY WITH A SIP OF WATER:  esomeprazole (NEXIUM) - (take one the night before and one on the morning of surgery - helps to prevent nausea after surgery.) FLUoxetine (PROZAC)  levothyroxine (SYNTHROID)  metoprolol tartrate (LOPRESSOR)  rosuvastatin (CRESTOR)  oxybutynin (DITROPAN-XL)   Use albuterol (PROAIR HFA)  on the day of surgery and bring to the hospital.   No Alcohol for 24 hours before or after surgery.  No Smoking including e-cigarettes for 24 hours before surgery.  No chewable tobacco products for at least 6 hours before surgery.  No nicotine patches on the day of surgery.  Do not use any "recreational" drugs for at least a week (preferably 2 weeks) before your surgery.  Please be advised that the combination of cocaine and anesthesia may have negative outcomes, up to and including death. If you test positive for  cocaine, your surgery will be cancelled.  On the morning of surgery brush your teeth with toothpaste and water, you may rinse your mouth with mouthwash if you wish. Do not swallow any toothpaste or mouthwash.  Use CHG Soap or wipes as directed on instruction sheet.  Do not wear jewelry, make-up, hairpins, clips or nail polish.  Do not wear lotions, powders, or perfumes.   Do not shave body hair from the neck down 48 hours before surgery.  Contact lenses, hearing aids and dentures may not be worn into surgery.  Do not bring valuables to the hospital. [facility] is not responsible for any missing/lost belongings or valuables.   Notify your doctor if there is any change in your medical condition (cold, fever, infection).  Wear comfortable clothing (specific to your surgery type) to the hospital.  After surgery, you can help prevent lung complications by doing breathing exercises.  Take deep breaths and cough every 1-2 hours. Your doctor may order a device called an Incentive Spirometer to help you take deep breaths. When coughing or sneezing, hold a pillow firmly against your incision with both hands. This is called "splinting." Doing this helps protect your incision. It also decreases belly discomfort.  If you are being admitted to the hospital overnight, leave your suitcase in the car. After surgery it may be brought to your room.  In case of increased patient census, it may be necessary for you, the patient, to continue your postoperative care in the Same Day Surgery department.  If you are being discharged the  day of surgery, you will not be allowed to drive home. You will need a responsible individual to drive you home and stay with you for 24 hours after surgery.   If you are taking public transportation, you will need to have a responsible individual with you.  Please call the Pre-admissions Testing Dept. at [phone] if you have any questions about these  instructions.  Surgery Visitation Policy:  Patients having surgery or a procedure may have two visitors.  Children under the age of 41 must have an adult with them who is not the patient.  Inpatient Visitation:    Visiting hours are 7 a.m. to 8 p.m. Up to four visitors are allowed at one time in a patient room. The visitors may rotate out with other people during the day.  One visitor age 12 or older may stay with the patient overnight and must be in the room by 8 p.m.    Preparing for Surgery with CHLORHEXIDINE GLUCONATE (CHG) Soap  Chlorhexidine Gluconate (CHG) Soap  o An antiseptic cleaner that kills germs and bonds with the skin to continue killing germs even after washing  o Used for showering the night before surgery and morning of surgery  Before surgery, you can play an important role by reducing the number of germs on your skin.  CHG (Chlorhexidine gluconate) soap is an antiseptic cleanser which kills germs and bonds with the skin to continue killing germs even after washing.  Please do not use if you have an allergy to CHG or antibacterial soaps. If your skin becomes reddened/irritated stop using the CHG.  1. Shower the NIGHT BEFORE SURGERY and the MORNING OF SURGERY with CHG soap.  2. If you choose to wash your hair, wash your hair first as usual with your normal shampoo.  3. After shampooing, rinse your hair and body thoroughly to remove the shampoo.  4. Use CHG as you would any other liquid soap. You can apply CHG directly to the skin and wash gently with a scrungie or a clean washcloth.  5. Apply the CHG soap to your body only from the neck down. Do not use on open wounds or open sores. Avoid contact with your eyes, ears, mouth, and genitals (private parts). Wash face and genitals (private parts) with your normal soap.  6. Wash thoroughly, paying special attention to the area where your surgery will be performed.  7. Thoroughly rinse your body with warm  water.  8. Do not shower/wash with your normal soap after using and rinsing off the CHG soap.  9. Pat yourself dry with a clean towel.  10. Wear clean pajamas to bed the night before surgery.  12. Place clean sheets on your bed the night of your first shower and do not sleep with pets.  13. Shower again with the CHG soap on the day of surgery prior to arriving at the hospital.  14. Do not apply any deodorants/lotions/powders.  15. Please wear clean clothes to the hospital.

## 2022-10-21 ENCOUNTER — Encounter
Admission: RE | Admit: 2022-10-21 | Discharge: 2022-10-21 | Disposition: A | Discharge status: Home / Self Care | Payer: 59 | Source: Ambulatory Visit | Attending: Surgery | Admitting: Surgery

## 2022-10-21 DIAGNOSIS — I10 Essential (primary) hypertension: Secondary | ICD-10-CM | POA: Insufficient documentation

## 2022-10-21 DIAGNOSIS — Z01818 Encounter for other preprocedural examination: Secondary | ICD-10-CM | POA: Diagnosis present

## 2022-10-21 DIAGNOSIS — K828 Other specified diseases of gallbladder: Secondary | ICD-10-CM | POA: Diagnosis not present

## 2022-10-21 DIAGNOSIS — Z01810 Encounter for preprocedural cardiovascular examination: Secondary | ICD-10-CM | POA: Insufficient documentation

## 2022-10-21 DIAGNOSIS — R9431 Abnormal electrocardiogram [ECG] [EKG]: Secondary | ICD-10-CM | POA: Diagnosis not present

## 2022-10-21 DIAGNOSIS — Z01812 Encounter for preprocedural laboratory examination: Secondary | ICD-10-CM | POA: Diagnosis not present

## 2022-10-21 LAB — CBC WITH DIFFERENTIAL/PLATELET
Abs Immature Granulocytes: 0.05 10*3/uL (ref 0.00–0.07)
Basophils Absolute: 0.1 10*3/uL (ref 0.0–0.1)
Basophils Relative: 1 %
Eosinophils Absolute: 0.4 10*3/uL (ref 0.0–0.5)
Eosinophils Relative: 5 %
HCT: 38.4 % (ref 36.0–46.0)
Hemoglobin: 12.6 g/dL (ref 12.0–15.0)
Immature Granulocytes: 1 %
Lymphocytes Relative: 25 %
Lymphs Abs: 2.2 10*3/uL (ref 0.7–4.0)
MCH: 28.4 pg (ref 26.0–34.0)
MCHC: 32.8 g/dL (ref 30.0–36.0)
MCV: 86.7 fL (ref 80.0–100.0)
Monocytes Absolute: 0.7 10*3/uL (ref 0.1–1.0)
Monocytes Relative: 7 %
Neutro Abs: 5.6 10*3/uL (ref 1.7–7.7)
Neutrophils Relative %: 61 %
Platelets: 238 10*3/uL (ref 150–400)
RBC: 4.43 MIL/uL (ref 3.87–5.11)
RDW: 13.1 % (ref 11.5–15.5)
WBC: 9 10*3/uL (ref 4.0–10.5)
nRBC: 0 % (ref 0.0–0.2)

## 2022-10-21 LAB — COMPREHENSIVE METABOLIC PANEL
ALT: 32 U/L (ref 0–44)
AST: 24 U/L (ref 15–41)
Albumin: 3.7 g/dL (ref 3.5–5.0)
Alkaline Phosphatase: 94 U/L (ref 38–126)
Anion gap: 8 (ref 5–15)
BUN: 10 mg/dL (ref 6–20)
CO2: 26 mmol/L (ref 22–32)
Calcium: 8.6 mg/dL — ABNORMAL LOW (ref 8.9–10.3)
Chloride: 106 mmol/L (ref 98–111)
Creatinine, Ser: 0.63 mg/dL (ref 0.44–1.00)
GFR, Estimated: >60 mL/min (ref >60)
Glucose, Bld: 114 mg/dL — ABNORMAL HIGH (ref 70–99)
Potassium: 3.7 mmol/L (ref 3.5–5.1)
Sodium: 140 mmol/L (ref 135–145)
Total Bilirubin: 1 mg/dL (ref 0.3–1.2)
Total Protein: 6.5 g/dL (ref 6.5–8.1)

## 2022-10-22 MED ORDER — GABAPENTIN 300 MG PO CAPS
300.0000 mg | ORAL_CAPSULE | ORAL | Status: AC
  Administered 2022-10-23: 300 mg via ORAL

## 2022-10-22 MED ORDER — CHLORHEXIDINE GLUCONATE CLOTH 2 % EX PADS
6.0000 | MEDICATED_PAD | Freq: Once | CUTANEOUS | Status: AC
  Administered 2022-10-22: 6 via TOPICAL

## 2022-10-22 MED ORDER — SODIUM CHLORIDE 0.9 % IV SOLN: INTRAVENOUS | Status: DC

## 2022-10-22 MED ORDER — CEFAZOLIN SODIUM-DEXTROSE 2-4 GM/100ML-% IV SOLN
2.0000 g | INTRAVENOUS | Status: AC
  Administered 2022-10-23: 2 g via INTRAVENOUS

## 2022-10-22 MED ORDER — BUPIVACAINE LIPOSOME 1.3 % IJ SUSP: 20.0000 mL | Freq: Once | INTRAMUSCULAR | Status: DC

## 2022-10-22 MED ORDER — CELECOXIB 200 MG PO CAPS
200.0000 mg | ORAL_CAPSULE | ORAL | Status: AC
  Administered 2022-10-23: 200 mg via ORAL

## 2022-10-22 MED ORDER — CHLORHEXIDINE GLUCONATE CLOTH 2 % EX PADS
6.0000 | MEDICATED_PAD | Freq: Once | CUTANEOUS | Status: AC
  Administered 2022-10-23: 6 via TOPICAL

## 2022-10-22 MED ORDER — ORAL CARE MOUTH RINSE: 15.0000 mL | Freq: Once | OROMUCOSAL | Status: AC

## 2022-10-22 MED ORDER — CHLORHEXIDINE GLUCONATE 0.12 % MT SOLN
15.0000 mL | Freq: Once | OROMUCOSAL | Status: AC
  Administered 2022-10-23: 15 mL via OROMUCOSAL

## 2022-10-22 MED ORDER — INDOCYANINE GREEN 25 MG IV SOLR
1.2500 mg | Freq: Once | INTRAVENOUS | Status: AC
  Administered 2022-10-23: 1.25 mg via INTRAVENOUS

## 2022-10-22 MED ORDER — ACETAMINOPHEN 500 MG PO TABS
1000.0000 mg | ORAL_TABLET | ORAL | Status: AC
  Administered 2022-10-23: 1000 mg via ORAL

## 2022-10-23 ENCOUNTER — Encounter: Payer: Self-pay | Admitting: Surgery

## 2022-10-23 ENCOUNTER — Other Ambulatory Visit: Payer: Self-pay

## 2022-10-23 ENCOUNTER — Other Ambulatory Visit: Payer: Self-pay | Admitting: Surgery

## 2022-10-23 ENCOUNTER — Ambulatory Visit: Payer: 59 | Admitting: Urgent Care

## 2022-10-23 ENCOUNTER — Ambulatory Visit
Admission: RE | Admit: 2022-10-23 | Discharge: 2022-10-23 | Disposition: A | Discharge status: Home / Self Care | Payer: 59 | Source: Ambulatory Visit | Attending: Surgery | Admitting: Surgery

## 2022-10-23 ENCOUNTER — Encounter
Admission: RE | Disposition: A | Discharge status: Home / Self Care | Payer: Self-pay | Source: Ambulatory Visit | Attending: Surgery

## 2022-10-23 DIAGNOSIS — I10 Essential (primary) hypertension: Secondary | ICD-10-CM | POA: Diagnosis not present

## 2022-10-23 DIAGNOSIS — E039 Hypothyroidism, unspecified: Secondary | ICD-10-CM | POA: Diagnosis not present

## 2022-10-23 DIAGNOSIS — K828 Other specified diseases of gallbladder: Secondary | ICD-10-CM

## 2022-10-23 DIAGNOSIS — E119 Type 2 diabetes mellitus without complications: Secondary | ICD-10-CM | POA: Insufficient documentation

## 2022-10-23 DIAGNOSIS — I4891 Unspecified atrial fibrillation: Secondary | ICD-10-CM | POA: Diagnosis not present

## 2022-10-23 DIAGNOSIS — K811 Chronic cholecystitis: Secondary | ICD-10-CM | POA: Diagnosis not present

## 2022-10-23 DIAGNOSIS — I2510 Atherosclerotic heart disease of native coronary artery without angina pectoris: Secondary | ICD-10-CM | POA: Insufficient documentation

## 2022-10-23 DIAGNOSIS — Z87891 Personal history of nicotine dependence: Secondary | ICD-10-CM | POA: Insufficient documentation

## 2022-10-23 DIAGNOSIS — R110 Nausea: Secondary | ICD-10-CM

## 2022-10-23 DIAGNOSIS — K219 Gastro-esophageal reflux disease without esophagitis: Secondary | ICD-10-CM | POA: Insufficient documentation

## 2022-10-23 DIAGNOSIS — K5900 Constipation, unspecified: Secondary | ICD-10-CM | POA: Diagnosis not present

## 2022-10-23 DIAGNOSIS — E1165 Type 2 diabetes mellitus with hyperglycemia: Secondary | ICD-10-CM

## 2022-10-23 HISTORY — DX: Hypothyroidism, unspecified: E03.9

## 2022-10-23 HISTORY — DX: Diverticulosis of large intestine without perforation or abscess without bleeding: K57.30

## 2022-10-23 HISTORY — DX: Type 2 diabetes mellitus without complications: E11.9

## 2022-10-23 LAB — GLUCOSE, CAPILLARY
Glucose-Capillary: 106 mg/dL — ABNORMAL HIGH (ref 70–99)
Glucose-Capillary: 158 mg/dL — ABNORMAL HIGH (ref 70–99)

## 2022-10-23 SURGERY — CHOLECYSTECTOMY, ROBOT-ASSISTED, LAPAROSCOPIC
Anesthesia: General | Site: Abdomen

## 2022-10-23 MED ORDER — ONDANSETRON HCL 4 MG/2ML IJ SOLN
4.0000 mg | Freq: Once | INTRAMUSCULAR | Status: AC
  Administered 2022-10-23: 4 mg via INTRAVENOUS

## 2022-10-23 MED ORDER — EPHEDRINE SULFATE (PRESSORS) 50 MG/ML IJ SOLN
INTRAMUSCULAR | Status: DC | PRN
  Administered 2022-10-23: 10 mg via INTRAVENOUS
  Administered 2022-10-23: 15 mg via INTRAVENOUS

## 2022-10-23 MED ORDER — BUPIVACAINE-EPINEPHRINE (PF) 0.25% -1:200000 IJ SOLN
INTRAMUSCULAR | Status: AC
  Filled 2022-10-23: qty 30

## 2022-10-23 MED ORDER — DEXAMETHASONE SODIUM PHOSPHATE 10 MG/ML IJ SOLN
INTRAMUSCULAR | Status: DC | PRN
  Administered 2022-10-23: 6 mg via INTRAVENOUS

## 2022-10-23 MED ORDER — MIDAZOLAM HCL 2 MG/2ML IJ SOLN
INTRAMUSCULAR | Status: AC
  Filled 2022-10-23: qty 2

## 2022-10-23 MED ORDER — ONDANSETRON HCL 4 MG/2ML IJ SOLN
INTRAMUSCULAR | Status: AC
  Filled 2022-10-23: qty 2

## 2022-10-23 MED ORDER — MIDAZOLAM HCL 2 MG/2ML IJ SOLN
INTRAMUSCULAR | Status: DC | PRN
  Administered 2022-10-23: 1 mg via INTRAVENOUS

## 2022-10-23 MED ORDER — DEXAMETHASONE SODIUM PHOSPHATE 10 MG/ML IJ SOLN
INTRAMUSCULAR | Status: AC
  Filled 2022-10-23: qty 1

## 2022-10-23 MED ORDER — ONDANSETRON 4 MG PO TBDP: 4.0000 mg | ORAL_TABLET | Freq: Three times a day (TID) | ORAL | 0 refills | Status: DC | PRN

## 2022-10-23 MED ORDER — FENTANYL CITRATE (PF) 100 MCG/2ML IJ SOLN
INTRAMUSCULAR | Status: AC
  Filled 2022-10-23: qty 2

## 2022-10-23 MED ORDER — SUCCINYLCHOLINE CHLORIDE 200 MG/10ML IV SOSY
PREFILLED_SYRINGE | INTRAVENOUS | Status: DC | PRN
  Administered 2022-10-23: 100 mg via INTRAVENOUS

## 2022-10-23 MED ORDER — EPHEDRINE 5 MG/ML INJ
INTRAVENOUS | Status: AC
  Filled 2022-10-23: qty 5

## 2022-10-23 MED ORDER — 0.9 % SODIUM CHLORIDE (POUR BTL) OPTIME
TOPICAL | Status: DC | PRN
  Administered 2022-10-23: 500 mL

## 2022-10-23 MED ORDER — ACETAMINOPHEN 500 MG PO TABS
ORAL_TABLET | ORAL | Status: AC
  Filled 2022-10-23: qty 2

## 2022-10-23 MED ORDER — ALBUTEROL SULFATE HFA 108 (90 BASE) MCG/ACT IN AERS
INHALATION_SPRAY | RESPIRATORY_TRACT | Status: AC
  Filled 2022-10-23: qty 6.7

## 2022-10-23 MED ORDER — ROCURONIUM BROMIDE 10 MG/ML (PF) SYRINGE
PREFILLED_SYRINGE | INTRAVENOUS | Status: AC
  Filled 2022-10-23: qty 10

## 2022-10-23 MED ORDER — CEFAZOLIN SODIUM-DEXTROSE 2-4 GM/100ML-% IV SOLN
INTRAVENOUS | Status: AC
  Filled 2022-10-23: qty 100

## 2022-10-23 MED ORDER — LIDOCAINE HCL (PF) 2 % IJ SOLN
INTRAMUSCULAR | Status: AC
  Filled 2022-10-23: qty 5

## 2022-10-23 MED ORDER — SUGAMMADEX SODIUM 200 MG/2ML IV SOLN
INTRAVENOUS | Status: DC | PRN
  Administered 2022-10-23: 100 mg via INTRAVENOUS
  Administered 2022-10-23: 300 mg via INTRAVENOUS

## 2022-10-23 MED ORDER — OXYCODONE HCL 5 MG PO TABS
ORAL_TABLET | ORAL | Status: AC
  Filled 2022-10-23: qty 1

## 2022-10-23 MED ORDER — CHLORHEXIDINE GLUCONATE 0.12 % MT SOLN
OROMUCOSAL | Status: AC
  Filled 2022-10-23: qty 15

## 2022-10-23 MED ORDER — PROPOFOL 10 MG/ML IV BOLUS
INTRAVENOUS | Status: AC
  Filled 2022-10-23: qty 20

## 2022-10-23 MED ORDER — ALBUTEROL SULFATE HFA 108 (90 BASE) MCG/ACT IN AERS
INHALATION_SPRAY | RESPIRATORY_TRACT | Status: DC | PRN
  Administered 2022-10-23 (×2): 4 via RESPIRATORY_TRACT

## 2022-10-23 MED ORDER — CELECOXIB 200 MG PO CAPS
ORAL_CAPSULE | ORAL | Status: AC
  Filled 2022-10-23: qty 1

## 2022-10-23 MED ORDER — FENTANYL CITRATE (PF) 100 MCG/2ML IJ SOLN
INTRAMUSCULAR | Status: DC | PRN
  Administered 2022-10-23: 50 ug via INTRAVENOUS

## 2022-10-23 MED ORDER — BUPIVACAINE LIPOSOME 1.3 % IJ SUSP
INTRAMUSCULAR | Status: AC
  Filled 2022-10-23: qty 20

## 2022-10-23 MED ORDER — HYDROCODONE-ACETAMINOPHEN 5-325 MG PO TABS: 1.0000 | ORAL_TABLET | Freq: Four times a day (QID) | ORAL | 0 refills | Status: DC | PRN

## 2022-10-23 MED ORDER — PROPOFOL 10 MG/ML IV BOLUS
INTRAVENOUS | Status: DC | PRN
  Administered 2022-10-23: 180 mg via INTRAVENOUS

## 2022-10-23 MED ORDER — ROCURONIUM BROMIDE 100 MG/10ML IV SOLN
INTRAVENOUS | Status: DC | PRN
  Administered 2022-10-23: 5 mg via INTRAVENOUS
  Administered 2022-10-23: 40 mg via INTRAVENOUS

## 2022-10-23 MED ORDER — ONDANSETRON HCL 4 MG/2ML IJ SOLN
INTRAMUSCULAR | Status: DC | PRN
  Administered 2022-10-23: 4 mg via INTRAVENOUS

## 2022-10-23 MED ORDER — BUPIVACAINE-EPINEPHRINE (PF) 0.25% -1:200000 IJ SOLN
INTRAMUSCULAR | Status: DC | PRN
  Administered 2022-10-23: 50 mL via INTRAMUSCULAR

## 2022-10-23 MED ORDER — OXYCODONE HCL 5 MG/5ML PO SOLN: 5.0000 mg | Freq: Once | ORAL | Status: AC | PRN

## 2022-10-23 MED ORDER — FENTANYL CITRATE (PF) 100 MCG/2ML IJ SOLN
25.0000 ug | INTRAMUSCULAR | Status: DC | PRN
  Administered 2022-10-23 (×2): 25 ug via INTRAVENOUS
  Administered 2022-10-23: 50 ug via INTRAVENOUS

## 2022-10-23 MED ORDER — INDOCYANINE GREEN 25 MG IV SOLR
INTRAVENOUS | Status: AC
  Filled 2022-10-23: qty 10

## 2022-10-23 MED ORDER — LIDOCAINE HCL (CARDIAC) PF 100 MG/5ML IV SOSY
PREFILLED_SYRINGE | INTRAVENOUS | Status: DC | PRN
  Administered 2022-10-23: 50 mg via INTRAVENOUS

## 2022-10-23 MED ORDER — GABAPENTIN 300 MG PO CAPS
ORAL_CAPSULE | ORAL | Status: AC
  Filled 2022-10-23: qty 1

## 2022-10-23 MED ORDER — OXYCODONE HCL 5 MG PO TABS
5.0000 mg | ORAL_TABLET | Freq: Once | ORAL | Status: AC | PRN
  Administered 2022-10-23: 5 mg via ORAL

## 2022-10-23 SURGICAL SUPPLY — 46 items
ADH SKN CLS APL DERMABOND .7 (GAUZE/BANDAGES/DRESSINGS) ×2
BAG PRESSURE INF REUSE 3000 (BAG) IMPLANT
CLIP LIGATING HEM O LOK PURPLE (MISCELLANEOUS) ×2 IMPLANT
COVER TIP SHEARS 8 DVNC (MISCELLANEOUS) ×2 IMPLANT
DERMABOND ADVANCED .7 DNX12 (GAUZE/BANDAGES/DRESSINGS) ×2 IMPLANT
DRAPE ARM DVNC X/XI (DISPOSABLE) ×8 IMPLANT
DRAPE COLUMN DVNC XI (DISPOSABLE) ×2 IMPLANT
ELECT CAUTERY BLADE 6.4 (BLADE) ×2 IMPLANT
FORCEPS BPLR R/ABLATION 8 DVNC (INSTRUMENTS) ×2 IMPLANT
FORCEPS PROGRASP DVNC XI (FORCEP) ×2 IMPLANT
GLOVE ORTHO TXT STRL SZ7.5 (GLOVE) ×4 IMPLANT
GOWN STRL REUS W/ TWL LRG LVL3 (GOWN DISPOSABLE) ×4 IMPLANT
GOWN STRL REUS W/ TWL XL LVL3 (GOWN DISPOSABLE) ×4 IMPLANT
GOWN STRL REUS W/TWL LRG LVL3 (GOWN DISPOSABLE) ×4
GOWN STRL REUS W/TWL XL LVL3 (GOWN DISPOSABLE) ×4
GRASPER SUT TROCAR 14GX15 (MISCELLANEOUS) ×2 IMPLANT
IRRIGATION STRYKERFLOW (MISCELLANEOUS) IMPLANT
IRRIGATOR STRYKERFLOW (MISCELLANEOUS)
IRRIGATOR SUCT 8 DISP DVNC XI (IRRIGATION / IRRIGATOR) IMPLANT
IV NS IRRIG 3000ML ARTHROMATIC (IV SOLUTION) IMPLANT
KIT PINK PAD W/HEAD ARE REST (MISCELLANEOUS) ×2
KIT PINK PAD W/HEAD ARM REST (MISCELLANEOUS) ×2 IMPLANT
KIT TURNOVER KIT A (KITS) ×2 IMPLANT
LABEL OR SOLS (LABEL) ×2 IMPLANT
MANIFOLD NEPTUNE II (INSTRUMENTS) ×2 IMPLANT
NDL HYPO 22X1.5 SAFETY MO (MISCELLANEOUS) ×2 IMPLANT
NDL INSUFFLATION 14GA 120MM (NEEDLE) ×2 IMPLANT
NEEDLE HYPO 22X1.5 SAFETY MO (MISCELLANEOUS) ×2
NEEDLE INSUFFLATION 14GA 120MM (NEEDLE) ×2
NS IRRIG 500ML POUR BTL (IV SOLUTION) ×2 IMPLANT
PACK LAP CHOLECYSTECTOMY (MISCELLANEOUS) ×2 IMPLANT
SCISSORS MNPLR CVD DVNC XI (INSTRUMENTS) ×2 IMPLANT
SEAL UNIV 5-12 XI (MISCELLANEOUS) ×8 IMPLANT
SET TUBE SMOKE EVAC HIGH FLOW (TUBING) ×2 IMPLANT
SOL ELECTROSURG ANTI STICK (MISCELLANEOUS) ×2
SOLUTION ELECTROSURG ANTI STCK (MISCELLANEOUS) ×2 IMPLANT
SPIKE FLUID TRANSFER (MISCELLANEOUS) ×2 IMPLANT
SUT MNCRL 4-0 (SUTURE) ×2
SUT MNCRL 4-0 27XMFL (SUTURE) ×2
SUT VICRYL 0 UR6 27IN ABS (SUTURE) ×2 IMPLANT
SUTURE MNCRL 4-0 27XMF (SUTURE) ×2 IMPLANT
SYS BAG RETRIEVAL 10MM (BASKET) ×2
SYSTEM BAG RETRIEVAL 10MM (BASKET) ×2 IMPLANT
TRAP FLUID SMOKE EVACUATOR (MISCELLANEOUS) ×2 IMPLANT
TROCAR Z-THREAD FIOS 11X100 BL (TROCAR) ×2 IMPLANT
WATER STERILE IRR 500ML POUR (IV SOLUTION) ×2 IMPLANT

## 2022-10-23 NOTE — Anesthesia Preprocedure Evaluation (Signed)
Anesthesia Evaluation  Patient identified by MRN, date of birth, ID band Patient awake    Reviewed: Allergy & Precautions, NPO status , Patient's Chart, lab work & pertinent test results  Airway Mallampati: III  TM Distance: <3 FB Neck ROM: full    Dental  (+) Chipped, Poor Dentition   Pulmonary neg pulmonary ROS, neg shortness of breath, former smoker   Pulmonary exam normal        Cardiovascular Exercise Tolerance: Good hypertension, (-) angina + CAD  + dysrhythmias Atrial Fibrillation      Neuro/Psych  Headaches PSYCHIATRIC DISORDERS         GI/Hepatic Neg liver ROS,GERD  Controlled,,  Endo/Other  diabetes, Type 2Hypothyroidism    Renal/GU Renal disease     Musculoskeletal   Abdominal   Peds  Hematology negative hematology ROS (+)   Anesthesia Other Findings Past Medical History: No date: Anginal pain (HCC)     Comment:  likely due to pleurisy No date: Anxiety No date: Biliary dyskinesia No date: Bulging lumbar disc No date: CAP (community acquired pneumonia) No date: Cardiac arrhythmia due to congenital heart disease     Comment:  no arrhythmia identified  No date: Congenital heart disease No date: Depression 05/2016: Fatty liver No date: Frequent headaches No date: GERD (gastroesophageal reflux disease) No date: Heart disease No date: Hypertension No date: Hypothyroidism No date: Kidney stones No date: Migraine No date: Obesity (BMI 35.0-39.9 without comorbidity) No date: PVC's (premature ventricular contractions) No date: Sigmoid diverticulosis No date: T2DM (type 2 diabetes mellitus) (HCC)  Past Surgical History: 1986: ABDOMINAL EXPLORATION SURGERY 06/2016: COLONOSCOPY 05/16/2021: CYSTOSCOPY/URETEROSCOPY/HOLMIUM LASER/STENT PLACEMENT;  Right     Comment:  Procedure: CYSTOSCOPY/URETEROSCOPY/HOLMIUM LASER/STENT               PLACEMENT;  Surgeon: Jannifer Hick, MD;  Location: [facility]                ORS;  Service: Urology;  Laterality: Right; 12/06/2019: ESOPHAGEAL MANOMETRY; N/A     Comment:  Procedure: ESOPHAGEAL MANOMETRY (EM);  Surgeon:               Napoleon Form, MD;  Location: [facility] ENDOSCOPY;                Service: Endoscopy;  Laterality: N/A; 12/03/2016: EXTRACORPOREAL SHOCK WAVE LITHOTRIPSY; Left     Comment:  Procedure: LEFT EXTRACORPOREAL SHOCK WAVE LITHOTRIPSY               (ESWL);  Surgeon: Bjorn Pippin, MD;  Location: [facility] ORS;                Service: Urology;  Laterality: Left; No date: KIDNEY STONE SURGERY 07/28/2016: LEFT HEART CATH AND CORONARY ANGIOGRAPHY     Comment:   Large tortuous coronary arteries no radiographic               evidence of disease.  Not anginal chest pain.  Normal EF. No date: LITHOTRIPSY No date: NASAL SINUS SURGERY 12/27/2017: ROTATOR CUFF REPAIR; Right 06/2017: TRANSTHORACIC ECHOCARDIOGRAM     Comment:  EF 60-65%.  GR 1-2 DD.  Otherwise normal echo.  Normal               valve function.  Normal wall motion. No date: TUBAL LIGATION  BMI    Body Mass Index: 34.53 kg/m      Reproductive/Obstetrics negative OB ROS  Anesthesia Physical Anesthesia Plan  ASA: 3  Anesthesia Plan: General ETT   Post-op Pain Management:    Induction: Intravenous  PONV Risk Score and Plan: Ondansetron, Dexamethasone, Midazolam and Treatment may vary due to age or medical condition  Airway Management Planned: Oral ETT  Additional Equipment:   Intra-op Plan:   Post-operative Plan: Extubation in OR  Informed Consent: I have reviewed the patients History and Physical, chart, labs and discussed the procedure including the risks, benefits and alternatives for the proposed anesthesia with the patient or authorized representative who has indicated his/her understanding and acceptance.     Dental Advisory Given  Plan Discussed with: Anesthesiologist, CRNA and Surgeon  Anesthesia Plan Comments:  (Patient consented for risks of anesthesia including but not limited to:  - adverse reactions to medications - damage to eyes, teeth, lips or other oral mucosa - nerve damage due to positioning  - sore throat or hoarseness - Damage to heart, brain, nerves, lungs, other parts of body or loss of life  Patient voiced understanding.)       Anesthesia Quick Evaluation

## 2022-10-23 NOTE — Transfer of Care (Signed)
Immediate Anesthesia Transfer of Care Note  Patient: Abigail [name]  Procedure(s) Performed: XI ROBOTIC ASSISTED LAPAROSCOPIC CHOLECYSTECTOMY (Abdomen) INDOCYANINE GREEN FLUORESCENCE IMAGING (ICG)  Patient Location: PACU  Anesthesia Type:General  Level of Consciousness: drowsy and responds to stimulation  Airway & Oxygen Therapy: Patient Spontanous Breathing and Patient connected to face mask oxygen  Post-op Assessment: Report given to RN and Post -op Vital signs reviewed and stable  Post vital signs: Reviewed and stable  Last Vitals:  Vitals Value Taken Time  BP    Temp 36.3 C 10/23/22 0842  Pulse    Resp    SpO2 100%     Last Pain:  Vitals:   10/23/22 0619  TempSrc: Temporal  PainSc: 4          Complications: No notable events documented.

## 2022-10-23 NOTE — Anesthesia Postprocedure Evaluation (Signed)
Anesthesia Post Note  Patient: Claris Gower A Lowrey  Procedure(s) Performed: XI ROBOTIC ASSISTED LAPAROSCOPIC CHOLECYSTECTOMY (Abdomen) INDOCYANINE GREEN FLUORESCENCE IMAGING (ICG)  Patient location during evaluation: PACU Anesthesia Type: General Level of consciousness: awake and alert Pain management: pain level controlled Vital Signs Assessment: post-procedure vital signs reviewed and stable Respiratory status: spontaneous breathing, nonlabored ventilation, respiratory function stable and patient connected to nasal cannula oxygen Cardiovascular status: blood pressure returned to baseline and stable Postop Assessment: no apparent nausea or vomiting Anesthetic complications: no   No notable events documented.   Last Vitals:  Vitals:   10/23/22 0915 10/23/22 0942  BP: 126/65 96/68  Pulse: 65 68  Resp: 14   Temp: (P) 36.7 C   SpO2: (P) 96% 96%    Last Pain:  Vitals:   10/23/22 0912  TempSrc:   PainSc: 10-Worst pain ever                 Cleda Mccreedy Meg Niemeier

## 2022-10-23 NOTE — Op Note (Signed)
Robotic cholecystectomy with Indocyamine Green Ductal Imaging.   Pre-operative Diagnosis: Chronic cholecystitis/biliary dyskinesia  Post-operative Diagnosis:  Same.  Procedure: Robotic assisted laparoscopic cholecystectomy with Indocyamine Green Ductal Imaging.   Surgeon: Campbell Lerner, M.D., FACS  Anesthesia: General. with endotracheal tube  Estimated Blood Loss: 5 mL         Drains: None         Specimens: Gallbladder           Complications: none  Procedure Details  The patient was seen again in the Holding Room.  1.25 mg dose of ICG was administered intravenously.   The benefits, complications, treatment options, risks and expected outcomes were again reviewed with the patient. The likelihood of improving the patient's symptoms with return to their baseline status is good.  The patient and/or family concurred with the proposed plan, giving informed consent, again alternatives reviewed.  The patient was taken to Operating Room, identified, and the procedure verified as robotic assisted laparoscopic cholecystectomy.  Prior to the induction of general anesthesia, antibiotic prophylaxis was administered. VTE prophylaxis was in place. General endotracheal anesthesia was then administered and tolerated well. The patient was positioned in the supine position.  After the induction, the abdomen was prepped with Chloraprep and draped in the sterile fashion.  A Time Out was held and the above information confirmed.  After local infiltration of quarter percent Marcaine with epinephrine, stab incision was made left upper quadrant.  Just below the costal margin at Palmer's point, approximately midclavicular line the Veres needle is passed with sensation of the layers to penetrate the abdominal wall and into the peritoneum.  Saline drop test is confirmed peritoneal placement.  Insufflation is initiated with carbon dioxide to pressures of 15 mmHg.  Local infiltration with a mixture of Exparel &  0.25% Marcaine with epinephrine is utilized for all port sites.  Made a 12 mm incision on the right periumbilical site, I advanced an optical 11mm port under direct visualization into the peritoneal cavity.  Once the peritoneum was penetrated, insufflation was initiated.  The trocar was then advanced into the abdominal cavity under direct visualization. Pneumoperitoneum was then continued utilizing CO2 at 15 mmHg or less and tolerated well without any adverse changes in the patient's vital signs.  Two 8.5-mm ports were placed in the left lower quadrant and laterally, and one to the right lower quadrant, all under direct vision. All skin incisions  were infiltrated with a local anesthetic agent before making the incision and placing the trocars.  The patient was positioned  in reverse Trendelenburg, tilted the patient's left side down.  Da Vinci XI robot was then positioned on to the patient's left side, and docked.  The gallbladder was identified, the fundus grasped via the arm 4 Prograsp and retracted cephalad. Adhesions were lysed with scissors and cautery.  The infundibulum was identified grasped and retracted laterally, exposing the peritoneum overlying the triangle of Calot. This was then opened and dissected using cautery & scissors. An extended critical view of the cystic duct and cystic artery was obtained, aided by the ICG via FireFly which improved localization of the ductal anatomy.    The cystic duct was clearly identified and dissected to isolation.   Artery well isolated and clipped, and the cystic duct was triple clipped and divided with scissors, as close to the gallbladder neck as feasible, thus leaving two on the remaining stump.  The specimen side of the artery is sealed with bipolar and divided with monopolar scissors.  The gallbladder was taken from the gallbladder fossa in a retrograde fashion with the electrocautery. The gallbladder was removed and placed in an Endocatch bag.  The  liver bed is inspected. Hemostasis was confirmed.  The robot was undocked and moved away from the operative field. No irrigation was utilized.   The gallbladder and Endocatch sac were then removed through the infraumbilical port site.   Inspection of the right upper quadrant was performed. No bleeding, bile duct injury or leak, or bowel injury was noted. The infra-umbilical port site fascia was closed with interrumpted 0 Vicryl sutures using PMI/cone under direct visualization. Pneumoperitoneum was released and ports removed.  4-0 subcuticular Monocryl was used to close the skin. Dermabond was  applied.  The patient was then extubated and brought to the recovery room in stable condition. Sponge, lap, and needle counts were correct at closure and at the conclusion of the case.               Campbell Lerner, M.D., [facility] 10/23/2022 8:50 AM

## 2022-10-23 NOTE — Discharge Instructions (Addendum)

## 2022-10-23 NOTE — Anesthesia Procedure Notes (Signed)
Procedure Name: Intubation Date/Time: 10/23/2022 7:41 AM  Performed by: Jeannene Patella, CRNAPre-anesthesia Checklist: Patient identified, Emergency Drugs available, Suction available, Patient being monitored and Timeout performed Patient Re-evaluated:Patient Re-evaluated prior to induction Oxygen Delivery Method: Circle system utilized Preoxygenation: Pre-oxygenation with 100% oxygen Induction Type: IV induction Ventilation: Mask ventilation with difficulty Laryngoscope Size: McGraph and 4 Grade View: Grade II Tube type: Oral Tube size: 6.5 mm Number of attempts: 1 Airway Equipment and Method: Stylet, LTA kit utilized and Video-laryngoscopy Placement Confirmation: ETT inserted through vocal cords under direct vision, positive ETCO2 and breath sounds checked- equal and bilateral Secured at: 20 cm Tube secured with: Tape Dental Injury: Teeth and Oropharynx as per pre-operative assessment  Comments: Cricoid pressure to achieve grade 2 view teeth unchanged s/p intubation very small mouth, big tongue

## 2022-10-23 NOTE — Interval H&P Note (Signed)
History and Physical Interval Note:  10/23/2022 7:23 AM  Abigail [name]  has presented today for surgery, with the diagnosis of gallstones.  The various methods of treatment have been discussed with the patient and family. After consideration of risks, benefits and other options for treatment, the patient has consented to  Procedure(s): XI ROBOTIC ASSISTED LAPAROSCOPIC CHOLECYSTECTOMY (N/A) INDOCYANINE GREEN FLUORESCENCE IMAGING (ICG) (N/A) as a surgical intervention.  The patient's history has been reviewed, patient examined, no change in status, stable for surgery.  I have reviewed the patient's chart and labs.  Questions were answered to the patient's satisfaction.     Campbell Lerner

## 2022-10-23 NOTE — Progress Notes (Signed)
Informed Dr. Randa Ngo pt c/o chest pain and stated "feels like gas."  Perfomed EKG per order.  Dr. Randa Ngo reviewed with pt result of EKG.

## 2022-10-26 ENCOUNTER — Other Ambulatory Visit: Payer: Self-pay | Admitting: Cardiology

## 2022-10-26 ENCOUNTER — Telehealth: Payer: Self-pay | Admitting: Surgery

## 2022-10-26 ENCOUNTER — Other Ambulatory Visit: Payer: Self-pay | Admitting: Gastroenterology

## 2022-10-26 NOTE — Telephone Encounter (Signed)
Patient had robotic cholecystectomy done on 10/23/22 with Dr. Claudine Mouton.  Patient will be returning to work this Wednesday 10/28/22 and needs a work note faxed to her employer with restrictions.  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  She does do some climbing of stairs and there is some heavy lifting, but mostly works at [employer].   If work note can be faxed to [employer] [place], attention Yehuda Budd,  fax # [phone]. Thank you.

## 2022-10-27 ENCOUNTER — Ambulatory Visit: Payer: 59 | Admitting: Physician Assistant

## 2022-10-27 ENCOUNTER — Encounter: Payer: Self-pay | Admitting: Physician Assistant

## 2022-10-27 VITALS — BP 106/67 | HR 67 | Temp 98.2°F | Ht 62.0 in | Wt 186.0 lb

## 2022-10-27 DIAGNOSIS — Z09 Encounter for follow-up examination after completed treatment for conditions other than malignant neoplasm: Secondary | ICD-10-CM

## 2022-10-27 DIAGNOSIS — K811 Chronic cholecystitis: Secondary | ICD-10-CM

## 2022-10-27 DIAGNOSIS — R110 Nausea: Secondary | ICD-10-CM

## 2022-10-27 DIAGNOSIS — K828 Other specified diseases of gallbladder: Secondary | ICD-10-CM

## 2022-10-27 NOTE — Patient Instructions (Signed)

## 2022-10-27 NOTE — Progress Notes (Signed)
[facility] POST-OP OFFICE VISIT  10/27/2022  HPI: Abigail [name] is a 57 y.o. female 4 days s/p robotic assisted laparoscopic cholecystectomy for [facility]  She is overall doing well given it is POD4 She is only worried about increased soreness at right sided incision No fever, chills, nausea, or emesis She has baseline fluctuation in bowels and this has not been exacerbated since surgery Does report decreased appetite Ambulating well; plan to return to work tomorrow  Vital signs: BP 106/67   Pulse 67   Temp 98.2 F (36.8 C)   Ht 5\' 2"  (1.575 m)   Wt 186 lb (84.4 kg)   LMP 08/12/2015 Comment: patient states hx. tubal ligation  SpO2 96%   BMI 34.02 kg/m    Physical Exam: Constitutional: Well appearing female, NAD Abdomen: Soft, expected mild incisional soreness, non-distended, no rebound/guarding Skin: Laparoscopic incisions are healing well, some scattered ecchymosis; no erythema or drainage   Assessment/Plan: This is a 57 y.o. female 4 days s/p robotic assisted laparoscopic cholecystectomy for [facility]   - She is overall doing well given it is only POD4. I suspect her discomfort is typical post-operative pain especially in this time frame. Encouraged continued use of OTC pain control as well as prescribed medication as needed, particularly QHS  - Reviewed wound care recommendation  - Reviewed lifting restrictions; 4 weeks total - work note given   - Reviewed surgical pathology; CC  - She will follow up as scheduled on 09/03; She understands to call with questions/concerns in the interim  -- Lynden Oxford, PA-C [facility] 10/27/2022, 10:44 AM M-F: 7am - 4pm

## 2022-11-10 ENCOUNTER — Encounter: Payer: Self-pay | Admitting: Physician Assistant

## 2022-11-10 ENCOUNTER — Ambulatory Visit (INDEPENDENT_AMBULATORY_CARE_PROVIDER_SITE_OTHER): Payer: 59 | Admitting: Physician Assistant

## 2022-11-10 ENCOUNTER — Other Ambulatory Visit: Payer: Self-pay | Admitting: Cardiology

## 2022-11-10 VITALS — BP 121/68 | HR 81 | Temp 98.0°F | Ht 62.0 in | Wt 185.0 lb

## 2022-11-10 DIAGNOSIS — K811 Chronic cholecystitis: Secondary | ICD-10-CM

## 2022-11-10 DIAGNOSIS — K828 Other specified diseases of gallbladder: Secondary | ICD-10-CM

## 2022-11-10 DIAGNOSIS — Z09 Encounter for follow-up examination after completed treatment for conditions other than malignant neoplasm: Secondary | ICD-10-CM

## 2022-11-10 NOTE — Progress Notes (Signed)
[facility] POST-OP OFFICE VISIT  11/10/2022  HPI: Abigail [name] is a 57 y.o. female 18 days s/p robotic assisted laparoscopic cholecystectomy for [facility] with Dr Claudine Mouton  She is doing much better Abdominal pain has resolved No fever, chills, nausea, emesis, or bowel changes Her appetite is decreased No issues with incisions  No other complaints   Vital signs: BP 121/68   Pulse 81   Temp 98 F (36.7 C)   Ht 5\' 2"  (1.575 m)   Wt 185 lb (83.9 kg)   LMP 08/12/2015 Comment: patient states hx. tubal ligation  SpO2 97%   BMI 33.84 kg/m    Physical Exam: Constitutional: Well appearing female, NAD Abdomen: Soft, non-tender, non-distended, no rebound/guarding Skin: Laparoscopic incisions are healing well, no erythema or drainage   Assessment/Plan: This is a 57 y.o. female 18 days s/p robotic assisted laparoscopic cholecystectomy for [facility] with Dr Claudine Mouton   - Pain control prn  - Reviewed wound care recommendation  - Reviewed lifting restrictions; 4 weeks total  - She can follow up on as needed basis; She understands to call with questions/concerns  -- Lynden Oxford, PA-C [facility] 11/10/2022, 4:17 PM M-F: 7am - 4pm

## 2022-11-10 NOTE — Patient Instructions (Signed)

## 2022-11-23 ENCOUNTER — Encounter: Payer: Self-pay | Admitting: Family Medicine

## 2022-11-23 ENCOUNTER — Ambulatory Visit
Admission: RE | Admit: 2022-11-23 | Discharge: 2022-11-23 | Disposition: A | Discharge status: Home / Self Care | Payer: 59 | Source: Ambulatory Visit | Attending: Family Medicine | Admitting: Family Medicine

## 2022-11-23 ENCOUNTER — Ambulatory Visit (INDEPENDENT_AMBULATORY_CARE_PROVIDER_SITE_OTHER): Payer: 59 | Admitting: Family Medicine

## 2022-11-23 VITALS — BP 122/70 | HR 72 | Temp 98.2°F | Resp 16 | Ht 62.0 in | Wt 183.5 lb

## 2022-11-23 DIAGNOSIS — R319 Hematuria, unspecified: Secondary | ICD-10-CM | POA: Diagnosis not present

## 2022-11-23 DIAGNOSIS — R8290 Unspecified abnormal findings in urine: Secondary | ICD-10-CM | POA: Diagnosis not present

## 2022-11-23 DIAGNOSIS — N200 Calculus of kidney: Secondary | ICD-10-CM

## 2022-11-23 DIAGNOSIS — R1030 Lower abdominal pain, unspecified: Secondary | ICD-10-CM | POA: Insufficient documentation

## 2022-11-23 LAB — POC URINALSYSI DIPSTICK (AUTOMATED)
Bilirubin, UA: NEGATIVE
Glucose, UA: NEGATIVE
Ketones, UA: NEGATIVE
Nitrite, UA: NEGATIVE
Protein, UA: POSITIVE — AB
Spec Grav, UA: 1.025 (ref 1.010–1.025)
Urobilinogen, UA: 0.2 U/dL
pH, UA: 6 (ref 5.0–8.0)

## 2022-11-23 LAB — COMPREHENSIVE METABOLIC PANEL
ALT: 31 U/L (ref 0–35)
AST: 21 U/L (ref 0–37)
Albumin: 3.9 g/dL (ref 3.5–5.2)
Alkaline Phosphatase: 111 U/L (ref 39–117)
BUN: 12 mg/dL (ref 6–23)
CO2: 27 meq/L (ref 19–32)
Calcium: 9 mg/dL (ref 8.4–10.5)
Chloride: 105 meq/L (ref 96–112)
Creatinine, Ser: 0.7 mg/dL (ref 0.40–1.20)
GFR: 95.7 mL/min (ref >60.00)
Glucose, Bld: 91 mg/dL (ref 70–99)
Potassium: 3.8 meq/L (ref 3.5–5.1)
Sodium: 141 meq/L (ref 135–145)
Total Bilirubin: 1.3 mg/dL — ABNORMAL HIGH (ref 0.2–1.2)
Total Protein: 6.6 g/dL (ref 6.0–8.3)

## 2022-11-23 LAB — CBC
HCT: 41.1 % (ref 36.0–46.0)
Hemoglobin: 13.5 g/dL (ref 12.0–15.0)
MCHC: 32.9 g/dL (ref 30.0–36.0)
MCV: 87.4 fl (ref 78.0–100.0)
Platelets: 258 10*3/uL (ref 150.0–400.0)
RBC: 4.71 Mil/uL (ref 3.87–5.11)
RDW: 14.4 % (ref 11.5–15.5)
WBC: 10.3 10*3/uL (ref 4.0–10.5)

## 2022-11-23 NOTE — Assessment & Plan Note (Addendum)
Urinary frequency, discomfort and hematuria. Also associated with low back pain.  Afebrile and hemodynamically stable. No acute abdomin on exam Check CBC, Cmet  Urine culture sent.  If positive will send in antibiotics for treatment

## 2022-11-23 NOTE — Assessment & Plan Note (Signed)
Restart Flomax 0.4 mg daily Follow up with Urology

## 2022-11-23 NOTE — Patient Instructions (Addendum)
It was a pleasure meeting you today. Thank you for allowing me to take part in your health care.  Our goals for today as we discussed include:  Start Flomax 0.4 mg daily Continue with Ibuprofen 400 mg every 8 hours for pain Sending urine for further evaluation and if positive for infection will call in prescription and notify you  Large amount of blood seen in urine.  Could be infection or passing of stone.  Will get some blood work today and imaging.   If you develop any worsening symptoms or are unable to keep down fluids, please go to the Emergency department.  This is a list of the screening recommended for you and due dates:  Health Maintenance  Topic Date Due   Complete foot exam   Never done   Zoster (Shingles) Vaccine (1 of 2) Never done   Flu Shot  10/08/2022   Hepatitis C Screening  01/24/2023*   Eye exam for diabetics  06/25/2023*   Hemoglobin A1C  11/29/2022   Yearly kidney health urinalysis for diabetes  05/29/2023   Pap Smear  05/30/2023   Yearly kidney function blood test for diabetes  10/21/2023   Mammogram  12/18/2023   Colon Cancer Screening  06/23/2025   DTaP/Tdap/Td vaccine (2 - Td or Tdap) 05/24/2031   HIV Screening  Completed   HPV Vaccine  Aged Out   COVID-19 Vaccine  Discontinued  *Topic was postponed. The date shown is not the original due date.    Follow up with PCP and Urology if no improvement in symptoms.  If you have any questions or concerns, please do not hesitate to call the office at [phone].  I look forward to our next visit and until then take care and stay safe.  Regards,   Dana Allan, MD   [facility]

## 2022-11-23 NOTE — Assessment & Plan Note (Signed)
Chronic POC urine positive for blood Restart Flomax 0.4 mg daily Advil 400 mg TID for discomfort Check CBC, Cmet today CT renal study

## 2022-11-23 NOTE — Progress Notes (Signed)
SUBJECTIVE:   Chief Complaint  Patient presents with   Hematuria    When urinating she feel likes she has to keep pushing to finish   Back Pain    Started yesterday    HPI Presents for acute visit  Symptoms started 2 days ago but worsened yesterday. Endorses pink blood in urine yesterday, that resolved, feeling of not emptying bladder, lower back pain, dysuria.  Denies any fevers but endorses feeling clammy.  Has had some nausea but able to maintain hydration.  Has had  similar symptoms in past with kidney stones.  Requesting complete work up for urine infection and stones.  Has Flomax that normally takes when feeling like stone coming on.  Follows with Urology in [place].  Water intake 72 oz daily.   PERTINENT PMH / PSH: DM T2 Hypothyroid Nephrolithiasis OAB  OBJECTIVE:  BP 122/70   Pulse 72   Temp 98.2 F (36.8 C)   Resp 16   Ht 5\' 2"  (1.575 m)   Wt 183 lb 8 oz (83.2 kg)   LMP 08/12/2015 Comment: patient states hx. tubal ligation  SpO2 95%   BMI 33.56 kg/m    Physical Exam Vitals reviewed.  Constitutional:      General: She is not in acute distress.    Appearance: She is not ill-appearing.  HENT:     Mouth/Throat:     Mouth: Mucous membranes are moist.  Cardiovascular:     Rate and Rhythm: Normal rate.  Pulmonary:     Effort: Pulmonary effort is normal.  Abdominal:     General: Bowel sounds are normal. There is no distension or abdominal bruit.     Palpations: Abdomen is soft. There is no fluid wave, hepatomegaly or pulsatile mass.     Tenderness: There is abdominal tenderness. There is no right CVA tenderness, left CVA tenderness, guarding or rebound. Negative signs include Murphy's sign and McBurney's sign.     Hernia: No hernia is present.  Skin:    Coloration: Skin is not jaundiced.  Neurological:     Mental Status: She is alert. Mental status is at baseline.  Psychiatric:        Mood and Affect: Mood normal.        Behavior: Behavior  normal.        Thought Content: Thought content normal.        Judgment: Judgment normal.        11/23/2022    9:06 AM 08/26/2022   10:43 AM 05/29/2022    2:31 PM 05/23/2021    7:35 AM 05/29/2020   10:05 AM  Depression screen PHQ 2/9  Decreased Interest 2 3 3 3 3   Down, Depressed, Hopeless 1 3 3 3 3   PHQ - 2 Score 3 6 6 6 6   Altered sleeping 0 0 2 1 3   Tired, decreased energy 0 3 2 3 3   Change in appetite 0 2 2 3  0  Feeling bad or failure about yourself  2 2 1 1 3   Trouble concentrating 1 2 2 3 3   Moving slowly or fidgety/restless  0 2 0 0  Suicidal thoughts 0 0 0 0 0  PHQ-9 Score 6 15 17 17 18   Difficult doing work/chores Somewhat difficult Somewhat difficult Extremely dIfficult Somewhat difficult Very difficult      11/23/2022    9:06 AM 08/26/2022   10:43 AM  GAD 7 : Generalized Anxiety Score  Nervous, Anxious, on Edge 2 2  Control/stop worrying 2  3  Worry too much - different things 2 2  Trouble relaxing 2 2  Restless 1 1  Easily annoyed or irritable 1 2  Afraid - awful might happen 1 2  Total GAD 7 Score 11 14  Anxiety Difficulty Somewhat difficult Somewhat difficult    ASSESSMENT/PLAN:  Renal stones Assessment & Plan: Chronic POC urine positive for blood Restart Flomax 0.4 mg daily Advil 400 mg TID for discomfort Check CBC, Cmet today CT renal study  Orders: -     CT RENAL STONE STUDY; Future  Hematuria, unspecified type Assessment & Plan: Restart Flomax 0.4 mg daily Follow up with Urology  Orders: -     POCT Urinalysis Dipstick (Automated) -     CBC -     Comprehensive metabolic panel  Abnormal urinalysis Assessment & Plan: Urinary frequency, discomfort and hematuria. Also associated with low back pain.  Afebrile and hemodynamically stable. No acute abdomin on exam Check CBC, Cmet  Urine culture sent.  If positive will send in antibiotics for treatment  Orders: -     Urine Culture   PDMP reviewed  Return if symptoms worsen or fail to  improve, for PCP.  Dana Allan, MD

## 2022-11-24 ENCOUNTER — Other Ambulatory Visit: Payer: Self-pay

## 2022-11-24 DIAGNOSIS — R109 Unspecified abdominal pain: Secondary | ICD-10-CM

## 2022-11-24 DIAGNOSIS — R110 Nausea: Secondary | ICD-10-CM

## 2022-11-24 MED ORDER — TAMSULOSIN HCL 0.4 MG PO CAPS
0.4000 mg | ORAL_CAPSULE | Freq: Every day | ORAL | 0 refills | Status: DC
Start: 2022-11-24 — End: 2023-11-09

## 2022-11-24 MED ORDER — ONDANSETRON 4 MG PO TBDP
4.0000 mg | ORAL_TABLET | Freq: Three times a day (TID) | ORAL | 0 refills | Status: DC | PRN
Start: 2022-11-24 — End: 2023-10-01

## 2022-11-25 ENCOUNTER — Other Ambulatory Visit: Payer: Self-pay | Admitting: Family Medicine

## 2022-11-25 ENCOUNTER — Telehealth: Payer: Self-pay | Admitting: Primary Care

## 2022-11-25 DIAGNOSIS — N3001 Acute cystitis with hematuria: Secondary | ICD-10-CM

## 2022-11-25 MED ORDER — CEFDINIR 300 MG PO CAPS
300.0000 mg | ORAL_CAPSULE | Freq: Two times a day (BID) | ORAL | 0 refills | Status: AC
Start: 2022-11-25 — End: 2022-11-30

## 2022-11-25 NOTE — Telephone Encounter (Signed)
Pt called stating she would like to know her lab results

## 2022-11-25 NOTE — Telephone Encounter (Signed)
Called and spoke to pt about lab results.

## 2022-11-26 LAB — URINE CULTURE
MICRO NUMBER:: 15471316
SPECIMEN QUALITY:: ADEQUATE

## 2022-12-03 NOTE — Telephone Encounter (Signed)
Needs office visit.

## 2022-12-04 ENCOUNTER — Ambulatory Visit (INDEPENDENT_AMBULATORY_CARE_PROVIDER_SITE_OTHER): Payer: 59 | Admitting: Primary Care

## 2022-12-04 ENCOUNTER — Ambulatory Visit: Payer: 59 | Admitting: Primary Care

## 2022-12-04 ENCOUNTER — Encounter: Payer: Self-pay | Admitting: Primary Care

## 2022-12-04 ENCOUNTER — Ambulatory Visit (INDEPENDENT_AMBULATORY_CARE_PROVIDER_SITE_OTHER)
Admission: RE | Admit: 2022-12-04 | Discharge: 2022-12-04 | Disposition: A | Discharge status: Home / Self Care | Payer: 59 | Source: Ambulatory Visit | Attending: Primary Care | Admitting: Primary Care

## 2022-12-04 ENCOUNTER — Other Ambulatory Visit: Payer: Self-pay | Admitting: Cardiology

## 2022-12-04 VITALS — BP 134/76 | HR 70 | Temp 98.1°F | Ht 62.0 in | Wt 190.0 lb

## 2022-12-04 DIAGNOSIS — R051 Acute cough: Secondary | ICD-10-CM

## 2022-12-04 DIAGNOSIS — R109 Unspecified abdominal pain: Secondary | ICD-10-CM

## 2022-12-04 DIAGNOSIS — B379 Candidiasis, unspecified: Secondary | ICD-10-CM

## 2022-12-04 DIAGNOSIS — R3915 Urgency of urination: Secondary | ICD-10-CM | POA: Diagnosis not present

## 2022-12-04 DIAGNOSIS — T3695XA Adverse effect of unspecified systemic antibiotic, initial encounter: Secondary | ICD-10-CM

## 2022-12-04 LAB — POC URINALSYSI DIPSTICK (AUTOMATED)
Bilirubin, UA: NEGATIVE
Blood, UA: POSITIVE
Glucose, UA: POSITIVE — AB
Ketones, UA: NEGATIVE
Nitrite, UA: NEGATIVE
Protein, UA: POSITIVE — AB
Spec Grav, UA: 1.02 (ref 1.010–1.025)
Urobilinogen, UA: 0.2 U/dL
pH, UA: 6 (ref 5.0–8.0)

## 2022-12-04 MED ORDER — HYDROCOD POLI-CHLORPHE POLI ER 10-8 MG/5ML PO SUER
5.0000 mL | Freq: Two times a day (BID) | ORAL | 0 refills | Status: DC | PRN
Start: 2022-12-04 — End: 2022-12-07

## 2022-12-04 MED ORDER — FLUCONAZOLE 150 MG PO TABS
150.0000 mg | ORAL_TABLET | Freq: Once | ORAL | 0 refills | Status: AC
Start: 2022-12-04 — End: 2022-12-04

## 2022-12-04 MED ORDER — SULFAMETHOXAZOLE-TRIMETHOPRIM 800-160 MG PO TABS
1.0000 | ORAL_TABLET | Freq: Two times a day (BID) | ORAL | 0 refills | Status: DC
Start: 2022-12-04 — End: 2023-06-01

## 2022-12-04 MED ORDER — BENZONATATE 200 MG PO CAPS
200.0000 mg | ORAL_CAPSULE | Freq: Three times a day (TID) | ORAL | 0 refills | Status: DC | PRN
Start: 2022-12-04 — End: 2023-06-01

## 2022-12-04 NOTE — Patient Instructions (Signed)
Complete xray(s) prior to leaving today. I will notify you of your results once received.  You may take the cough suppressant every 12 hours as needed for cough and rest. Caution this medication contains codeine which may cause drowsiness.   Resume your Flomax as discussed.  It was a pleasure to see you today!

## 2022-12-04 NOTE — Assessment & Plan Note (Signed)
Differentials include cystitis versus renal stone. Urinalysis today with 3+ leuks, 3+ blood, negative nitrites. Culture ordered and pending.  KUB x-ray ordered and pending. Reviewed CT renal stone scan from 2 weeks ago.  Resume Flomax 0.4 mg daily to twice daily. Start Bactrim DS (sulfamethoxazole/trimethoprim) tablets for urinary tract infection. Take 1 tablet by mouth twice daily for 3 days. Fluconazole 150 mg tablet provided to use for vaginitis if needed.

## 2022-12-04 NOTE — Assessment & Plan Note (Addendum)
Likely viral at this point.  Discussed conservative treatment with over-the-counter meds. Rx for [employer] and Tussionex provided per patient request.  Drowsiness precautions provided.

## 2022-12-04 NOTE — Progress Notes (Signed)
Subjective:    Patient ID: Abigail [name], female    DOB: Nov 21, 1965, 57 y.o.   MRN: [id number]  HPI  Abigail [name] is a very pleasant 57 y.o. female with a history of recurrent renal stones, cystitis, type 2 diabetes, hypothyroidism, chronic back pain, incomplete bladder emptying, fatigue, hematuria who presents today   Evaluated on 11/23/2022 at [facility] for a 2-day history of hematuria, incomplete bladder emptying, lower back pain, dysuria.  She resumed taking Flomax for fear of renal stone.  Urine culture was positive with Proteus Mirabilis with resistance to nitrofurantoin and imipenem.  She was treated with cefdinir 300 mg twice daily x 5 days. She underwent CT renal stone study which revealed a 4 mm stone at the left UPJ without hydronephrosis.   She contacted our office on 12/02/2022 requesting additional antibiotics for continued symptoms.  She was asked to come to the office to provide another urine specimen and for further evaluation.  Today she continues to experience urinary urgency, bladder pressure, left lateral lower side pain. Her symptoms never resolved after she took the antibiotics, but they did improve until about four days ago. She believes she passed her kidney stone last weekend.   She received her flu shot yesterday. A few hours later she began feeling a sore throat and mild cough. Last night her cough progressed.    Review of Systems  Constitutional:  Positive for fatigue. Negative for chills and fever.  Respiratory:  Positive for cough. Negative for shortness of breath.   Genitourinary:  Positive for frequency. Negative for dysuria, hematuria and urgency.       Bladder pressure          Past Medical History:  Diagnosis Date   Anginal pain (HCC)    likely due to pleurisy   Anxiety    Biliary dyskinesia    Bulging lumbar disc    CAP (community acquired pneumonia)    Cardiac arrhythmia due to congenital heart disease    no  arrhythmia identified    Congenital heart disease    Depression    Fatty liver 05/2016   Frequent headaches    GERD (gastroesophageal reflux disease)    Heart disease    Hypertension    Hypothyroidism    Kidney stones    Migraine    Obesity (BMI 35.0-39.9 without comorbidity)    PVC's (premature ventricular contractions)    Sigmoid diverticulosis    T2DM (type 2 diabetes mellitus) (HCC)     Social History   Socioeconomic History   Marital status: Single    Spouse name: Not on file   Number of children: Not on file   Years of education: Not on file   Highest education level: Not on file  Occupational History   Not on file  Tobacco Use   Smoking status: Former    Current packs/day: 0.00    Types: Cigarettes    Quit date: 2008    Years since quitting: 16.7    Passive exposure: Past   Smokeless tobacco: Never   Tobacco comments:    used tobacco "socially"  Vaping Use   Vaping status: Never Used  Substance and Sexual Activity   Alcohol use: No    Alcohol/week: 0.0 standard drinks of alcohol   Drug use: No   Sexual activity: Not on file  Other Topics Concern   Not on file  Social History Narrative   Single.   Has a set of Twins, age 41.  Works for the [employer] and [employer]   Enjoys relaxing, spending time with her children.   Social Determinants of Health   Financial Resource Strain: Not on file  Food Insecurity: Not on file  Transportation Needs: Not on file  Physical Activity: Not on file  Stress: Not on file  Social Connections: Unknown (07/21/2021)   Received from [facility]   Social Network    Social Network: Not on file  Intimate Partner Violence: Unknown (06/12/2021)   Received from [facility], [facility]   HITS    Physically Hurt: Not on file    Insult or Talk Down To: Not on file    Threaten Physical Harm: Not on file    Scream or Curse: Not on file    Past Surgical History:  Procedure Laterality  Date   ABDOMINAL EXPLORATION SURGERY  1986   COLONOSCOPY  06/2016   CYSTOSCOPY/URETEROSCOPY/HOLMIUM LASER/STENT PLACEMENT Right 05/16/2021   Procedure: CYSTOSCOPY/URETEROSCOPY/HOLMIUM LASER/STENT PLACEMENT;  Surgeon: Jannifer Hick, MD;  Location: [facility];  Service: Urology;  Laterality: Right;   ESOPHAGEAL MANOMETRY N/A 12/06/2019   Procedure: ESOPHAGEAL MANOMETRY (EM);  Surgeon: Napoleon Form, MD;  Location: [facility];  Service: Endoscopy;  Laterality: N/A;   EXTRACORPOREAL SHOCK WAVE LITHOTRIPSY Left 12/03/2016   Procedure: LEFT EXTRACORPOREAL SHOCK WAVE LITHOTRIPSY (ESWL);  Surgeon: Bjorn Pippin, MD;  Location: [facility];  Service: Urology;  Laterality: Left;   KIDNEY STONE SURGERY     LEFT HEART CATH AND CORONARY ANGIOGRAPHY  07/28/2016    Large tortuous coronary arteries no radiographic evidence of disease.  Not anginal chest pain.  Normal EF.   LITHOTRIPSY     NASAL SINUS SURGERY     ROTATOR CUFF REPAIR Right 12/27/2017   TRANSTHORACIC ECHOCARDIOGRAM  06/2017   EF 60-65%.  GR 1-2 DD.  Otherwise normal echo.  Normal valve function.  Normal wall motion.   TUBAL LIGATION      Family History  Problem Relation Age of Onset   Gallstones Mother    COPD Father    Emphysema Father    Colon cancer Neg Hx    Stomach cancer Neg Hx    Esophageal cancer Neg Hx    Pancreatic cancer Neg Hx    Liver disease Neg Hx    Colon polyps Neg Hx    Rectal cancer Neg Hx     Allergies  Allergen Reactions   Latex Rash   Penicillins Nausea Only    Has patient had a PCN reaction causing immediate rash, facial/tongue/throat swelling, SOB or lightheadedness with hypotension: No Has patient had a PCN reaction causing severe rash involving mucus membranes or skin necrosis: No Has patient had a PCN reaction that required hospitalization: No Has patient had a PCN reaction occurring within the last 10 years: No If all of the above answers are "NO", then may proceed with Cephalosporin use.     Current  Outpatient Medications on File Prior to Visit  Medication Sig Dispense Refill   Accu-Chek FastClix Lancets MISC USE 1 LANCET TO CHECK BLOOD SUGAR THREE TIMES DAILY 300 each 1   ADVIL 200 MG tablet Take 200-600 mg by mouth every 6 (six) hours as needed for mild pain or headache.     albuterol (PROAIR HFA) 108 (90 Base) MCG/ACT inhaler Inhale 2 puffs into the lungs every 6 (six) hours as needed for wheezing or shortness of breath. 8 g 0   Aspirin-Caffeine (BAYER BACK & BODY PO)  Take 1 tablet by mouth daily as needed (body pain).     Blood Glucose Monitoring Suppl (ACCU-CHEK GUIDE) w/Device KIT by Does not apply route.     Cholecalciferol (VITAMIN D3) 1.25 MG (50000 UT) CAPS Take 1 capsule by mouth once a week 12 capsule 0   esomeprazole (NEXIUM) 40 MG capsule TAKE 1 CAPSULE BY MOUTH DAILY 90 capsule 1   FLUoxetine (PROZAC) 40 MG capsule TAKE 1 CAPSULE BY MOUTH ONCE DAILY FOR ANXIETY 90 capsule 2   HYDROcodone-acetaminophen (NORCO/VICODIN) 5-325 MG tablet Take 1 tablet by mouth every 6 (six) hours as needed.     Lancets Misc. (ACCU-CHEK FASTCLIX LANCET) KIT Use as directed to test blood sugar daily 1 kit 0   levothyroxine (SYNTHROID) 75 MCG tablet TAKE 1 TABLET BY MOUTH IN THE MORNING ON  AN  EMPTY  STOMACH  WITH  WATER  ONLY  NO  FOOD  OR  OTHER  MEDICATIONS  FOR  30  MINUTES 90 tablet 3   LINZESS 145 MCG CAPS capsule Take 1 capsule by mouth at bedtime 30 capsule 0   metoprolol tartrate (LOPRESSOR) 25 MG tablet Take 1 tablet by mouth twice daily 60 tablet 0   ondansetron (ZOFRAN-ODT) 4 MG disintegrating tablet Take 1 tablet (4 mg total) by mouth every 8 (eight) hours as needed for nausea or vomiting. 15 tablet 0   oxybutynin (DITROPAN-XL) 10 MG 24 hr tablet Take 10 mg by mouth at bedtime.     tamsulosin (FLOMAX) 0.4 MG CAPS capsule Take 1 capsule (0.4 mg total) by mouth daily. As needed for kidney stone 30 capsule 0   traMADol (ULTRAM) 50 MG tablet Take 50 mg by mouth every 4 (four) hours as needed.      clotrimazole (LOTRIMIN) 1 % cream Apply to affected area 2 times daily (Patient not taking: Reported on 12/04/2022) 15 g 0   No current facility-administered medications on file prior to visit.    BP 134/76   Pulse 70   Temp 98.1 F (36.7 C) (Temporal)   Ht 5\' 2"  (1.575 m)   Wt 190 lb (86.2 kg)   LMP 08/12/2015 Comment: patient states hx. tubal ligation  SpO2 98%   BMI 34.75 kg/m  Objective:   Physical Exam Constitutional:      General: She is not in acute distress.    Appearance: She is not ill-appearing.  Cardiovascular:     Rate and Rhythm: Normal rate and regular rhythm.  Pulmonary:     Effort: Pulmonary effort is normal.     Breath sounds: Normal breath sounds. No wheezing or rhonchi.  Abdominal:     Palpations: Abdomen is soft.     Tenderness: There is no abdominal tenderness. There is no right CVA tenderness or left CVA tenderness.  Musculoskeletal:     Cervical back: Neck supple.  Skin:    General: Skin is warm and dry.  Neurological:     Mental Status: She is alert and oriented to person, place, and time.  Psychiatric:        Mood and Affect: Mood normal.           Assessment & Plan:  Urinary urgency Assessment & Plan: Differentials include cystitis versus renal stone. Urinalysis today with 3+ leuks, 3+ blood, negative nitrites. Culture ordered and pending.  KUB x-ray ordered and pending. Reviewed CT renal stone scan from 2 weeks ago.  Resume Flomax 0.4 mg daily to twice daily. Start Bactrim DS (sulfamethoxazole/trimethoprim) tablets for urinary tract  infection. Take 1 tablet by mouth twice daily for 3 days. Fluconazole 150 mg tablet provided to use for vaginitis if needed.   Orders: -     POCT Urinalysis Dipstick (Automated) -     Urine Culture -     Sulfamethoxazole-Trimethoprim; Take 1 tablet by mouth 2 (two) times daily. For urinary tract infection.  Dispense: 6 tablet; Refill: 0  Flank pain -     POCT Urinalysis Dipstick (Automated) -      Urine Culture -     DG Abd 1 View  Antibiotic-induced yeast infection -     Fluconazole; Take 1 tablet (150 mg total) by mouth once for 1 dose.  Dispense: 1 tablet; Refill: 0  Acute cough Assessment & Plan: Likely viral at this point.  Discussed conservative treatment with over-the-counter meds. Rx for [employer] and Tussionex provided per patient request.  Drowsiness precautions provided.  Orders: -     Hydrocod Poli-Chlorphe Poli ER; Take 5 mLs by mouth every 12 (twelve) hours as needed.  Dispense: 50 mL; Refill: 0 -     Benzonatate; Take 1 capsule (200 mg total) by mouth 3 (three) times daily as needed for cough.  Dispense: 15 capsule; Refill: 0        Doreene Nest, NP

## 2022-12-06 ENCOUNTER — Other Ambulatory Visit: Payer: Self-pay | Admitting: Cardiology

## 2022-12-06 LAB — URINE CULTURE
MICRO NUMBER:: 15525140
SPECIMEN QUALITY:: ADEQUATE

## 2022-12-06 NOTE — Telephone Encounter (Signed)
Abigail [name], will you find out what's going on with the pharmacy and cough medication?

## 2022-12-07 MED ORDER — HYDROCOD POLI-CHLORPHE POLI ER 10-8 MG/5ML PO SUER: 5.0000 mL | Freq: Two times a day (BID) | ORAL | 0 refills | Status: DC | PRN

## 2022-12-07 NOTE — Telephone Encounter (Signed)
Pt called stating she needs rx sent to [facility] - [place], [place] - [address] for tussionex. Call back # [phone]

## 2022-12-08 ENCOUNTER — Other Ambulatory Visit: Payer: Self-pay | Admitting: Gastroenterology

## 2022-12-22 ENCOUNTER — Other Ambulatory Visit: Payer: Self-pay | Admitting: Cardiology

## 2023-01-01 ENCOUNTER — Other Ambulatory Visit: Payer: Self-pay | Admitting: Gastroenterology

## 2023-01-16 ENCOUNTER — Other Ambulatory Visit: Payer: Self-pay | Admitting: Primary Care

## 2023-01-16 DIAGNOSIS — E559 Vitamin D deficiency, unspecified: Secondary | ICD-10-CM

## 2023-01-25 ENCOUNTER — Other Ambulatory Visit: Payer: Self-pay | Admitting: Gastroenterology

## 2023-01-27 ENCOUNTER — Other Ambulatory Visit: Payer: Self-pay | Admitting: Urology

## 2023-01-27 ENCOUNTER — Other Ambulatory Visit: Payer: Self-pay

## 2023-01-27 ENCOUNTER — Encounter (HOSPITAL_COMMUNITY)
Admission: RE | Disposition: A | Discharge status: Home / Self Care | Payer: Self-pay | Source: Ambulatory Visit | Attending: Urology

## 2023-01-27 ENCOUNTER — Inpatient Hospital Stay (HOSPITAL_BASED_OUTPATIENT_CLINIC_OR_DEPARTMENT_OTHER): Payer: 59 | Admitting: Anesthesiology

## 2023-01-27 ENCOUNTER — Inpatient Hospital Stay (HOSPITAL_COMMUNITY): Payer: 59

## 2023-01-27 ENCOUNTER — Inpatient Hospital Stay (HOSPITAL_COMMUNITY): Payer: 59 | Admitting: Anesthesiology

## 2023-01-27 ENCOUNTER — Ambulatory Visit (HOSPITAL_COMMUNITY)
Admission: RE | Admit: 2023-01-27 | Discharge: 2023-01-27 | Disposition: A | Discharge status: Home / Self Care | Payer: 59 | Source: Ambulatory Visit | Attending: Urology | Admitting: Urology

## 2023-01-27 ENCOUNTER — Encounter (HOSPITAL_COMMUNITY): Payer: Self-pay | Admitting: Urology

## 2023-01-27 DIAGNOSIS — N201 Calculus of ureter: Secondary | ICD-10-CM | POA: Diagnosis present

## 2023-01-27 DIAGNOSIS — Z87891 Personal history of nicotine dependence: Secondary | ICD-10-CM | POA: Diagnosis not present

## 2023-01-27 DIAGNOSIS — F418 Other specified anxiety disorders: Secondary | ICD-10-CM | POA: Diagnosis not present

## 2023-01-27 DIAGNOSIS — Z01818 Encounter for other preprocedural examination: Secondary | ICD-10-CM

## 2023-01-27 DIAGNOSIS — K219 Gastro-esophageal reflux disease without esophagitis: Secondary | ICD-10-CM | POA: Insufficient documentation

## 2023-01-27 DIAGNOSIS — I10 Essential (primary) hypertension: Secondary | ICD-10-CM | POA: Insufficient documentation

## 2023-01-27 DIAGNOSIS — E039 Hypothyroidism, unspecified: Secondary | ICD-10-CM | POA: Diagnosis not present

## 2023-01-27 HISTORY — PX: CYSTOSCOPY WITH RETROGRADE PYELOGRAM, URETEROSCOPY AND STENT PLACEMENT: SHX5789

## 2023-01-27 LAB — GLUCOSE, CAPILLARY: Glucose-Capillary: 128 mg/dL — ABNORMAL HIGH (ref 70–99)

## 2023-01-27 SURGERY — CYSTOURETEROSCOPY, WITH RETROGRADE PYELOGRAM AND STENT INSERTION
Anesthesia: General | Laterality: Left

## 2023-01-27 MED ORDER — ORAL CARE MOUTH RINSE: 15.0000 mL | Freq: Once | OROMUCOSAL | Status: AC

## 2023-01-27 MED ORDER — FENTANYL CITRATE (PF) 100 MCG/2ML IJ SOLN
INTRAMUSCULAR | Status: AC
  Filled 2023-01-27: qty 2

## 2023-01-27 MED ORDER — PROPOFOL 10 MG/ML IV BOLUS
INTRAVENOUS | Status: AC
  Filled 2023-01-27: qty 20

## 2023-01-27 MED ORDER — FENTANYL CITRATE PF 50 MCG/ML IJ SOSY: 25.0000 ug | PREFILLED_SYRINGE | INTRAMUSCULAR | Status: DC | PRN

## 2023-01-27 MED ORDER — ONDANSETRON HCL 4 MG/2ML IJ SOLN
INTRAMUSCULAR | Status: AC
  Filled 2023-01-27: qty 2

## 2023-01-27 MED ORDER — MIDAZOLAM HCL 5 MG/5ML IJ SOLN
INTRAMUSCULAR | Status: DC | PRN
  Administered 2023-01-27: 2 mg via INTRAVENOUS

## 2023-01-27 MED ORDER — OXYCODONE HCL 5 MG/5ML PO SOLN: 5.0000 mg | Freq: Once | ORAL | Status: DC | PRN

## 2023-01-27 MED ORDER — MIDAZOLAM HCL 2 MG/2ML IJ SOLN: 0.5000 mg | Freq: Once | INTRAMUSCULAR | Status: DC | PRN

## 2023-01-27 MED ORDER — MIDAZOLAM HCL 2 MG/2ML IJ SOLN
INTRAMUSCULAR | Status: AC
  Filled 2023-01-27: qty 2

## 2023-01-27 MED ORDER — EPHEDRINE SULFATE (PRESSORS) 50 MG/ML IJ SOLN
INTRAMUSCULAR | Status: DC | PRN
  Administered 2023-01-27 (×2): 5 mg via INTRAVENOUS

## 2023-01-27 MED ORDER — PHENYLEPHRINE HCL (PRESSORS) 10 MG/ML IV SOLN
INTRAVENOUS | Status: DC | PRN
Start: 2023-01-27 — End: 2023-01-27
  Administered 2023-01-27 (×2): 120 ug via INTRAVENOUS

## 2023-01-27 MED ORDER — SODIUM CHLORIDE 0.9 % IR SOLN
Status: DC | PRN
  Administered 2023-01-27: 3000 mL

## 2023-01-27 MED ORDER — STERILE WATER FOR IRRIGATION IR SOLN
Status: DC | PRN
  Administered 2023-01-27: 500 mL

## 2023-01-27 MED ORDER — MEPERIDINE HCL 50 MG/ML IJ SOLN: 6.2500 mg | INTRAMUSCULAR | Status: DC | PRN

## 2023-01-27 MED ORDER — LACTATED RINGERS IV SOLN: INTRAVENOUS | Status: DC

## 2023-01-27 MED ORDER — CHLORHEXIDINE GLUCONATE 0.12 % MT SOLN
15.0000 mL | Freq: Once | OROMUCOSAL | Status: AC
  Administered 2023-01-27: 15 mL via OROMUCOSAL

## 2023-01-27 MED ORDER — PROPOFOL 10 MG/ML IV BOLUS
INTRAVENOUS | Status: DC | PRN
  Administered 2023-01-27 (×2): 100 mg via INTRAVENOUS

## 2023-01-27 MED ORDER — FENTANYL CITRATE (PF) 100 MCG/2ML IJ SOLN
INTRAMUSCULAR | Status: DC | PRN
  Administered 2023-01-27: 50 ug via INTRAVENOUS

## 2023-01-27 MED ORDER — LIDOCAINE HCL (CARDIAC) PF 100 MG/5ML IV SOSY
PREFILLED_SYRINGE | INTRAVENOUS | Status: DC | PRN
  Administered 2023-01-27: 60 mg via INTRAVENOUS

## 2023-01-27 MED ORDER — ONDANSETRON HCL 4 MG/2ML IJ SOLN
INTRAMUSCULAR | Status: DC | PRN
  Administered 2023-01-27: 4 mg via INTRAVENOUS

## 2023-01-27 MED ORDER — CIPROFLOXACIN IN D5W 400 MG/200ML IV SOLN
400.0000 mg | Freq: Once | INTRAVENOUS | Status: AC
  Administered 2023-01-27: 400 mg via INTRAVENOUS

## 2023-01-27 MED ORDER — ACETAMINOPHEN 500 MG PO TABS
1000.0000 mg | ORAL_TABLET | Freq: Once | ORAL | Status: AC
  Administered 2023-01-27: 1000 mg via ORAL
  Filled 2023-01-27: qty 2

## 2023-01-27 MED ORDER — DEXAMETHASONE SODIUM PHOSPHATE 10 MG/ML IJ SOLN
INTRAMUSCULAR | Status: AC
  Filled 2023-01-27: qty 1

## 2023-01-27 MED ORDER — DEXAMETHASONE SODIUM PHOSPHATE 4 MG/ML IJ SOLN
INTRAMUSCULAR | Status: DC | PRN
  Administered 2023-01-27: 5 mg via INTRAVENOUS

## 2023-01-27 MED ORDER — SCOPOLAMINE 1 MG/3DAYS TD PT72
1.0000 | MEDICATED_PATCH | TRANSDERMAL | Status: DC
  Administered 2023-01-27: 1.5 mg via TRANSDERMAL
  Filled 2023-01-27: qty 1

## 2023-01-27 MED ORDER — OXYCODONE HCL 5 MG PO TABS: 5.0000 mg | ORAL_TABLET | Freq: Once | ORAL | Status: DC | PRN

## 2023-01-27 MED ORDER — CIPROFLOXACIN IN D5W 400 MG/200ML IV SOLN
INTRAVENOUS | Status: AC
  Filled 2023-01-27: qty 200

## 2023-01-27 MED ORDER — TRAMADOL HCL 50 MG PO TABS: 50.0000 mg | ORAL_TABLET | Freq: Four times a day (QID) | ORAL | 0 refills | Status: AC | PRN

## 2023-01-27 SURGICAL SUPPLY — 18 items
BAG COUNTER SPONGE SURGICOUNT (BAG) IMPLANT
BAG URO CATCHER STRL LF (MISCELLANEOUS) ×1 IMPLANT
BASKET ZERO TIP NITINOL 2.4FR (BASKET) IMPLANT
CATH URETL OPEN END 6FR 70 (CATHETERS) IMPLANT
CLOTH BEACON ORANGE TIMEOUT ST (SAFETY) ×1 IMPLANT
GLOVE SURG LX STRL 7.5 STRW (GLOVE) ×1 IMPLANT
GOWN STRL REUS W/ TWL XL LVL3 (GOWN DISPOSABLE) ×1 IMPLANT
GUIDEWIRE STR DUAL SENSOR (WIRE) ×1 IMPLANT
GUIDEWIRE ZIPWRE .038 STRAIGHT (WIRE) IMPLANT
IV NS 1000ML BAXH (IV SOLUTION) ×1 IMPLANT
KIT TURNOVER KIT A (KITS) IMPLANT
LASER FIB FLEXIVA PULSE ID 365 (Laser) IMPLANT
MANIFOLD NEPTUNE II (INSTRUMENTS) ×1 IMPLANT
PACK CYSTO (CUSTOM PROCEDURE TRAY) ×1 IMPLANT
SHEATH NAVIGATOR HD 12/14X36 (SHEATH) IMPLANT
TRACTIP FLEXIVA PULS ID 200XHI (Laser) IMPLANT
TUBING CONNECTING 10 (TUBING) ×1 IMPLANT
TUBING UROLOGY SET (TUBING) ×1 IMPLANT

## 2023-01-27 NOTE — Transfer of Care (Signed)
Immediate Anesthesia Transfer of Care Note  Patient: Abigail [name]  Procedure(s) Performed: CYSTOSCOPY, LEFT URETEROSCOPY and stone extraction (Left)  Patient Location: PACU  Anesthesia Type:General  Level of Consciousness: awake, alert , and oriented  Airway & Oxygen Therapy: Patient Spontanous Breathing and Patient connected to nasal cannula oxygen  Post-op Assessment: Report given to RN and Post -op Vital signs reviewed and stable  Post vital signs: Reviewed and stable  Last Vitals:  Vitals Value Taken Time  BP 98/65 01/27/23 1645  Temp    Pulse 78 01/27/23 1645  Resp 12 01/27/23 1645  SpO2 98 % 01/27/23 1645  Vitals shown include unfiled device data.  Last Pain:  Vitals:   01/27/23 1448  TempSrc: Oral  PainSc: 6       Patients Stated Pain Goal: 3 (01/27/23 1448)  Complications: No notable events documented.

## 2023-01-27 NOTE — Anesthesia Postprocedure Evaluation (Signed)
Anesthesia Post Note  Patient: Danny Lawless Folts  Procedure(s) Performed: CYSTOSCOPY, LEFT URETEROSCOPY and stone extraction (Left)     Patient location during evaluation: PACU Anesthesia Type: General Level of consciousness: awake and alert, patient cooperative and oriented Pain management: pain level controlled Vital Signs Assessment: post-procedure vital signs reviewed and stable Respiratory status: spontaneous breathing, nonlabored ventilation and respiratory function stable Cardiovascular status: blood pressure returned to baseline and stable Postop Assessment: no apparent nausea or vomiting, able to ambulate and adequate PO intake Anesthetic complications: no   No notable events documented.  Last Vitals:  Vitals:   01/27/23 1715 01/27/23 1722  BP: 110/62 125/77  Pulse: 70 71  Resp: 10 16  Temp: 36.4 C   SpO2: 98% 98%    Last Pain:  Vitals:   01/27/23 1722  TempSrc:   PainSc: 0-No pain                 Gurpreet Mikhail,E. Jourden Gilson

## 2023-01-27 NOTE — H&P (Signed)
Office Visit Report     01/27/2023   --------------------------------------------------------------------------------   Abigail [name]  MRN: [id number]  DOB: 07-04-1965, 57 year old Female  SSN: [id number]   PRIMARY CARE:  Vernona Rieger, NP  PRIMARY CARE FAX:  [phone]  REFERRING:  Gwynneth Macleod, NP  PROVIDER:  Jerilee Field, M.D.  TREATING:  Heloise Purpura, M.D.  LOCATION:  [facility] [phone]     --------------------------------------------------------------------------------   CC/HPI: Suprapubic pain   Abigail [name] is a patient followed by Dr. Mena Goes she was recently seen in the office on 01/18/2023. She was diagnosed with a 4 mm left UPJ stone in September by CT imaging. When she was here last week, she was having suprapubic pressure and dysuria. She was treated for possible urinary tract infection. She did have a KUB and renal ultrasound that did not demonstrate hydronephrosis or an obvious stone on plain imaging. She presents today stating that Abigail [name] symptoms were improving on the antibiotics but are now worsening again. She has had both left flank pain and continues to have fairly intense suprapubic pressure. She denies any fever.     ALLERGIES: Latex - Skin Rash penicillin    MEDICATIONS: Levothyroxine Sodium 50 mcg tablet  Metoprolol Tartrate 25 mg tablet  Percocet 5 mg-325 mg tablet 1 tablet PO Q 4 H PRN  Albuterol Sulfate  Aspirin Ec 81 mg tablet, delayed release  Esomeprazole Magnesium 40 mg capsule,delayed release  Fluoxetine Hcl 40 mg capsule  Ibuprofen 200 mg tablet  Linzess 72 mcg capsule  Ondansetron Hcl 4 mg tablet 1 tablet PO Q 6 H PRN  Oxybutynin Chloride Er 10 mg tablet, extended release 24 hr 1 tablet PO Daily  Rosuvastatin Calcium  Tramadol Hcl     GU PSH: ESWL - 2018 Ureteroscopic laser litho - 2023     NON-GU PSH: Bilateral Tubal Ligation Exploratory Laparotomy Lip Surgery Procedure - 2002 Sinus  surgery     GU PMH: Acute Cystitis/UTI - 01/18/2023 Microscopic hematuria - 01/18/2023 Renal and ureteral calculus - 01/18/2023 Flank Pain - 07/24/2022, - 06/09/2022, - 2022, - 2019, - 2019 (Chronic), Left, Ketorolac 60 mg IM today. KUB appears today to show excellent fragmentation of mid left ureteral calculus. No distal calcifications noted. Reassured no overt hydronephrosis noted. It appears all stone fragments have passed and pain is most likely just bladder spasms and she can take OTC NSAID, AZO, push fluids, and stop Tamsulosin at this time. Instructed to f/u as scheduled and to bring stone fragments to this appt, - 2018 Hydronephrosis - 06/09/2022 Renal calculus - 06/09/2022, (Stable), send PTH, Ca, mag, uric. Send 24 hr urine. , - 04/29/2022, - 2023, - 2021 Mixed incontinence, disc bt, pt, oab meds. increase oxyb to 10 mg. - 04/29/2022, - 2019 Ureteral calculus, Right ureteral calculus at UVJ. Given time, this should pass. However, the patient is adamant about not wanting to wait and wanting a procedure done ASAP. She currently has no complicating factors. - 2023, Given Abigail [name] pain distribution, Abigail [name] previously diagnosed right UPJ calculus is either at the UVJ or within the bladder and about to pass., - 2023, - 2018, - 2018, - 2018, - 2018 LLQ pain, Left - 2022, (Chronic), Left, Ketorolac 60 mg IM today, - 2018 Nocturia - 2019 Urinary Frequency (Stable) - 2019, - 2019 Renal cyst - 2018    NON-GU PMH: Anxiety Arrhythmia Diabetes Type 2 Diverticulosis Fatty (change of) liver, not elsewhere classified GERD Major depressive disorder, recurrent,  unspecified Migraine, unspecified, not intractable, without status migrainosus    FAMILY HISTORY: Cholelithiasis - Mother copd - Father Emphysema - Father   SOCIAL HISTORY: Marital Status: Divorced Preferred Language: English; Ethnicity: Not Hispanic Or Latino; Race: White Current Smoking Status: Patient does not smoke anymore. Has not smoked since  10/08/2006.   Tobacco Use Assessment Completed: Used Tobacco in last 30 days? Has never drank.  Drinks 1 caffeinated drink per day. Patient's occupation [address]    REVIEW OF SYSTEMS:    GU Review Female:   Patient denies frequent urination, hard to postpone urination, burning /pain with urination, get up at night to urinate, leakage of urine, stream starts and stops, trouble starting your stream, have to strain to urinate, and currently pregnant.  Gastrointestinal (Lower):   Patient denies diarrhea and constipation.  Gastrointestinal (Upper):   Patient denies nausea and vomiting.  Constitutional:   Patient denies fever, night sweats, weight loss, and fatigue.  Skin:   Patient denies skin rash/ lesion and itching.  Eyes:   Patient denies blurred vision and double vision.  Ears/ Nose/ Throat:   Patient denies sore throat and sinus problems.  Hematologic/Lymphatic:   Patient denies swollen glands and easy bruising.  Cardiovascular:   Patient denies leg swelling and chest pains.  Respiratory:   Patient denies cough and shortness of breath.  Endocrine:   Patient denies excessive thirst.  Musculoskeletal:   Patient denies back pain and joint pain.  Neurological:   Patient denies headaches and dizziness.  Psychologic:   Patient denies depression and anxiety.   Notes: lower abdomen pain, pt stated she felt stone move last night, felt likt pin and needles, pain started after she urinated last night     VITAL SIGNS:      01/27/2023 11:32 AM  Weight 185 lb / 83.91 kg  Height 62 in / 157.48 cm  BP 107/70 mmHg  Pulse 76 /min  Temperature 94.1 F / 34.5 C  BMI 33.8 kg/m   MULTI-SYSTEM PHYSICAL EXAMINATION:    Constitutional: Well-nourished. No physical deformities. Normally developed. Good grooming.  Respiratory: No labored breathing, no use of accessory muscles.   Cardiovascular: Normal temperature, normal extremity pulses, no swelling, no varicosities.   Gastrointestinal: No  left CVA tenderness.     Complexity of Data:  X-Ray Review: C.T. Abdomen/Pelvis: Reviewed Films.    Notes:                     I independently reviewed Abigail [name] CT scan. This demonstrates a crowning 4 mm distal left UVJ calculus. She has a nonobstructing left renal calculus.   PROCEDURES:         C.T. Urogram - [id number]      Patient confirmed No Neulasta OnPro Device.         Urinalysis w/Scope - 81001 Dipstick Dipstick Cont'd Micro  Specimen: Voided Bilirubin: Neg WBC/hpf: 10 - 20/hpf  Color: Yellow Ketones: Neg RBC/hpf: 0 - 2/hpf  Appearance: Clear Blood: Neg Bacteria: Few (10-25/hpf)  Specific Gravity: 1.025 Protein: Trace Cystals: NS (Not Seen)  pH: 5.5 Urobilinogen: 0.2 Casts: Hyaline  Glucose: Neg Nitrites: Neg Trichomonas: Not Present    Leukocyte Esterase: 3+ Mucous: Present      Epithelial Cells: 20 - 40/hpf      Yeast: NS (Not Seen)      Sperm: Not Present    Notes:      ASSESSMENT: None   PLAN:  Orders X-Rays: C.T. Stone Protocol Without I.V. Contrast          Schedule Return Visit/Planned Activity: Keep Scheduled Appointment          Document Letter(s):  Created for Patient: Clinical Summary   Created for Patient: Clinical Summary         Notes:   1. Left ureteral calculus: We reviewed Abigail [name] CT scan confirming a distal left ureteral stone. At this time, Abigail [name] symptoms are significantly bothersome and she does wish to proceed with surgical intervention. She will therefore be scheduled for cystoscopy, left ureteroscopy with stone removal, possible laser lithotripsy, and possible left ureteral stent. We have discussed the potential risks and complications as well as expected recovery process. She gives informed consent to proceed.   CC: Vernona Rieger, NP  Dr. Karma Greaser        Next Appointment:      Next Appointment: 02/26/2023 03:45 PM    Appointment Type: Office Visit Established Patient    Location: [facility] [phone]     Provider: Anne Fu, NP    Reason for Visit: 1 mnth ov extender

## 2023-01-27 NOTE — Discharge Instructions (Signed)
You may see some blood in the urine and may have some burning with urination for 48-72 hours. You also may notice that you have to urinate more frequently or urgently after your procedure which is normal.  You should call should you develop an inability urinate, fever > 101, persistent nausea and vomiting that prevents you from eating or drinking to stay hydrated.    

## 2023-01-27 NOTE — Op Note (Addendum)
Preoperative diagnosis: Left ureteral calculus  Postoperative diagnosis: Left ureteral calculus  Procedure:  Cystoscopy Left ureteroscopy and stone removal  Surgeon: Moody Bruins. M.D.  Anesthesia: General  Complications: None  EBL: Minimal  Specimens: Left ureteral calculus  Disposition of specimens: [facility] for stone analysis  Indication: Abigail [name] is a 57 y.o. year old patient with urolithiasis and a symptomatic distal left ureteral calculus. After reviewing the management options for treatment, the patient elected to proceed with the above surgical procedure(s). We have discussed the potential benefits and risks of the procedure, side effects of the proposed treatment, the likelihood of the patient achieving the goals of the procedure, and any potential problems that might occur during the procedure or recuperation. Informed consent has been obtained.  Description of procedure:  The patient was taken to the operating room and general anesthesia was induced.  The patient was placed in the dorsal lithotomy position, prepped and draped in the usual sterile fashion, and preoperative antibiotics were administered. A preoperative time-out was performed.   Cystourethroscopy was performed.  The patient's urethra was examined and was normal. The bladder was then systematically examined in its entirety. There was no evidence for any bladder tumors, stones, or other mucosal pathology.    Attention then turned to the left ureteral orifice and the stone was identified crowning.  A 0.38 sensor guidewire was then advanced up the left ureter into the renal pelvis under fluoroscopic guidance.   The 6 Fr semirigid ureteroscope was then advanced into the ureter next to the guidewire and the calculus was identified and easily removed with a zero tip nitinol basket.  Reinspection of the ureter revealed no remaining visible stones or fragments.   The bladder  was then emptied and the procedure ended.  The patient appeared to tolerate the procedure well and without complications.  The patient was able to be awakened and transferred to the recovery unit in satisfactory condition.

## 2023-01-27 NOTE — Anesthesia Procedure Notes (Signed)
Procedure Name: LMA Insertion Date/Time: 01/27/2023 4:10 PM  Performed by: Nathen May, CRNAPre-anesthesia Checklist: Patient identified, Emergency Drugs available, Suction available and Patient being monitored Patient Re-evaluated:Patient Re-evaluated prior to induction Oxygen Delivery Method: Circle System Utilized Preoxygenation: Pre-oxygenation with 100% oxygen Induction Type: IV induction Ventilation: Mask ventilation without difficulty LMA: LMA inserted LMA Size: 4.0 Number of attempts: 1 Airway Equipment and Method: Bite block Placement Confirmation: positive ETCO2 Tube secured with: Tape Dental Injury: Teeth and Oropharynx as per pre-operative assessment

## 2023-01-27 NOTE — Anesthesia Preprocedure Evaluation (Addendum)
Anesthesia Evaluation  Patient identified by MRN, date of birth, ID band Patient awake    Reviewed: Allergy & Precautions, NPO status , Patient's Chart, lab work & pertinent test results, reviewed documented beta blocker date and time   History of Anesthesia Complications Negative for: history of anesthetic complications  Airway Mallampati: II  TM Distance: >3 FB Neck ROM: Full    Dental  (+) Dental Advisory Given   Pulmonary former smoker   breath sounds clear to auscultation       Cardiovascular hypertension, Pt. on medications and Pt. on home beta blockers (-) angina (-) CAD  Rhythm:Regular Rate:Normal  '18 cath: normal coronaries  '19 ECHO:  - Left ventricle: The cavity size was normal. mild LVH. Systolic function was normal.    EF 60% to 65%. Wall motion was normal; there were no regional wall motion    abnormalities. Diastolic dysfunction, indeterminate. Indeterminate filling pressures.  - Aortic valve: There was trivial regurgitation.  - Tricuspid valve: There was mild regurgitation.     Neuro/Psych  Headaches  Anxiety Depression       GI/Hepatic Neg liver ROS,GERD  Medicated and Controlled,,  Endo/Other  neg diabetesHypothyroidism  BM 33.8  Renal/GU negative Renal ROSstones     Musculoskeletal   Abdominal   Peds  Hematology   Anesthesia Other Findings   Reproductive/Obstetrics                             Anesthesia Physical Anesthesia Plan  ASA: 3  Anesthesia Plan: General   Post-op Pain Management: Tylenol PO (pre-op)*   Induction: Intravenous  PONV Risk Score and Plan: 3 and Ondansetron, Dexamethasone and Scopolamine patch - Pre-op  Airway Management Planned: LMA  Additional Equipment: None  Intra-op Plan:   Post-operative Plan:   Informed Consent: I have reviewed the patients History and Physical, chart, labs and discussed the procedure including the risks,  benefits and alternatives for the proposed anesthesia with the patient or authorized representative who has indicated his/her understanding and acceptance.     Dental advisory given  Plan Discussed with: CRNA and Surgeon  Anesthesia Plan Comments:         Anesthesia Quick Evaluation

## 2023-01-28 ENCOUNTER — Encounter (HOSPITAL_COMMUNITY): Payer: Self-pay | Admitting: Urology

## 2023-01-28 LAB — POCT I-STAT, CHEM 8
BUN: 11 mg/dL (ref 6–20)
Calcium, Ion: 1.17 mmol/L (ref 1.15–1.40)
Chloride: 105 mmol/L (ref 98–111)
Creatinine, Ser: 0.8 mg/dL (ref 0.44–1.00)
Glucose, Bld: 96 mg/dL (ref 70–99)
HCT: 37 % (ref 36.0–46.0)
Hemoglobin: 12.6 g/dL (ref 12.0–15.0)
Potassium: 3.6 mmol/L (ref 3.5–5.1)
Sodium: 141 mmol/L (ref 135–145)
TCO2: 24 mmol/L (ref 22–32)

## 2023-02-18 ENCOUNTER — Telehealth: Payer: Self-pay | Admitting: Cardiology

## 2023-02-18 NOTE — Telephone Encounter (Signed)
Patient wants a call back specifically from [facility].

## 2023-02-18 NOTE — Telephone Encounter (Signed)
Patient identification verified by 2 forms. Shade Flood, RN     Called and spoke to patient  Patient states: having CP since 12/11 as soon as she first woke up. CP was consistent but was more mild toward the evening. CP still present this morning and got worst as she started getting dressed.  "Feels like something is lodged in her chest, like pressure".  Reports diarrhea. Reports drinking two sodas this morning. Took ompreprazole today 20mg  this morning around 7-8am but did not resolve CP. Reports dizziness and ache in left arm that "feels more like sore muscle.[name]..Marland Kitchenor when you put that pressure cuff on and it squeezes".   Patient denies: burping, SOB, headache, one-sided weakness.              Interventions/Plan: Based on symptoms and chart review recommended patient be seen at nearest ED for evaluation of CP and arm pain.    Reviewed ED warning signs/precautions  Patient agrees with plan, no further questions at this time

## 2023-02-19 ENCOUNTER — Other Ambulatory Visit: Payer: Self-pay | Admitting: Gastroenterology

## 2023-02-23 ENCOUNTER — Telehealth: Payer: Self-pay | Admitting: Primary Care

## 2023-02-23 DIAGNOSIS — R918 Other nonspecific abnormal finding of lung field: Secondary | ICD-10-CM

## 2023-02-23 NOTE — Telephone Encounter (Addendum)
Please call patient:  She is due to have a repeat CT scan of her chest for re-evaluation of the pulmonary nodules seen on her CT scan from December 2023. Does she want to proceed? If so, Ken Caryl or ?  ----- Message from Doreene Nest sent at 03/13/2022 10:56 AM EST ----- Regarding: Due for CT chest Repeat CT chest for micro pulmonary nodules. See CT angio from December 2023.

## 2023-02-23 NOTE — Telephone Encounter (Signed)
Called and spoke with patient she is agreeable to proceed with CT scan. She is would like to have this done at [facility] imaging.

## 2023-02-23 NOTE — Telephone Encounter (Signed)
Unable to reach patient. Left voicemail to return call to our office.   

## 2023-02-24 NOTE — Telephone Encounter (Signed)
Noted, orders placed. 

## 2023-03-17 ENCOUNTER — Other Ambulatory Visit: Payer: 59

## 2023-03-18 ENCOUNTER — Other Ambulatory Visit: Payer: 59

## 2023-03-18 ENCOUNTER — Inpatient Hospital Stay: Admission: RE | Admit: 2023-03-18 | Payer: 59 | Source: Ambulatory Visit

## 2023-03-19 ENCOUNTER — Inpatient Hospital Stay: Admission: RE | Admit: 2023-03-19 | Payer: 59 | Source: Ambulatory Visit

## 2023-03-21 ENCOUNTER — Other Ambulatory Visit: Payer: Self-pay | Admitting: Cardiology

## 2023-03-24 ENCOUNTER — Ambulatory Visit
Admission: RE | Admit: 2023-03-24 | Discharge: 2023-03-24 | Disposition: A | Discharge status: Home / Self Care | Payer: 59 | Source: Ambulatory Visit | Attending: Primary Care | Admitting: Primary Care

## 2023-03-24 DIAGNOSIS — R918 Other nonspecific abnormal finding of lung field: Secondary | ICD-10-CM

## 2023-03-26 ENCOUNTER — Other Ambulatory Visit: Payer: Self-pay | Admitting: Primary Care

## 2023-03-26 DIAGNOSIS — F32A Depression, unspecified: Secondary | ICD-10-CM

## 2023-03-26 NOTE — Telephone Encounter (Signed)
Patient is due for CPE/follow up in late March, this will be required prior to any further refills.  Please schedule, thank you!   

## 2023-03-26 NOTE — Telephone Encounter (Signed)
lvm for pt to call office to schedule

## 2023-03-26 NOTE — Telephone Encounter (Signed)
Copied from CRM [id number]. Topic: General - Other >> Mar 26, 2023  9:26 AM Florestine Avers wrote: Reason for CRM: Returning a call, patient states they did not leave a message.

## 2023-03-30 ENCOUNTER — Encounter: Payer: Self-pay | Admitting: Cardiology

## 2023-03-30 ENCOUNTER — Ambulatory Visit: Payer: 59 | Attending: Cardiology | Admitting: Cardiology

## 2023-03-30 VITALS — BP 126/74 | HR 63 | Ht 62.0 in | Wt 190.0 lb

## 2023-03-30 DIAGNOSIS — R918 Other nonspecific abnormal finding of lung field: Secondary | ICD-10-CM

## 2023-03-30 DIAGNOSIS — E1169 Type 2 diabetes mellitus with other specified complication: Secondary | ICD-10-CM | POA: Diagnosis not present

## 2023-03-30 DIAGNOSIS — I493 Ventricular premature depolarization: Secondary | ICD-10-CM

## 2023-03-30 DIAGNOSIS — E785 Hyperlipidemia, unspecified: Secondary | ICD-10-CM

## 2023-03-30 DIAGNOSIS — R9431 Abnormal electrocardiogram [ECG] [EKG]: Secondary | ICD-10-CM | POA: Diagnosis not present

## 2023-03-30 DIAGNOSIS — R072 Precordial pain: Secondary | ICD-10-CM | POA: Diagnosis not present

## 2023-03-30 DIAGNOSIS — I2510 Atherosclerotic heart disease of native coronary artery without angina pectoris: Secondary | ICD-10-CM

## 2023-03-30 DIAGNOSIS — E669 Obesity, unspecified: Secondary | ICD-10-CM | POA: Diagnosis not present

## 2023-03-30 MED ORDER — METOPROLOL TARTRATE 25 MG PO TABS: 25.0000 mg | ORAL_TABLET | Freq: Two times a day (BID) | ORAL | 3 refills | Status: DC

## 2023-03-30 NOTE — Patient Instructions (Addendum)
Medication Instructions:   See below  instructions- metoprolol *If you need a refill on your cardiac medications before your next appointment, please call your pharmacy*   Lab Work: CMP If you have labs (blood work) drawn today and your tests are completely normal, you will receive your results only by: MyChart Message (if you have MyChart) OR A paper copy in the mail If you have any lab test that is abnormal or we need to change your treatment, we will call you to review the results.   Testing/Procedures: Will be schedule at [facility]  Your physician has requested that you have coronary  CTA. Coronary computed tomography (CT)angiogram  is a special type of CT scan that uses a computer to produce multi-dimensional views of major blood vessels throughout the heart.  CT angiography, a contrast material is injected through an IV to help visualize the blood vessels  a painless test that uses an x-ray machine to take clear, detailed pictures of your heart arteries .  Please follow instruction sheet as given.   Follow-Up: At [facility], you and your health needs are our priority.  As part of our continuing mission to provide you with exceptional heart care, we have created designated Provider Care Teams.  These Care Teams include your primary Cardiologist (physician) and Advanced Practice Providers (APPs -  Physician Assistants and Nurse Practitioners) who all work together to provide you with the care you need, when you need it.     Your next appointment:    5 to 6 month(s)  June or July   The format for your next appointment:   In Person  Provider:   Bryan Lemma, MD    Other Instructions    Your cardiac CT will be scheduled at one of the below locations:   [facility] [address] [place], [place] [address] [phone]   If scheduled at [facility], please arrive at the [facility] and Children's Entrance (Entrance C2) of [facility] 30  minutes prior to test start time. You can use the FREE valet parking offered at entrance C (encouraged to control the heart rate for the test)  Proceed to the [facility] Radiology Department (first floor) to check-in and test prep.  All radiology patients and guests should use entrance C2 at [facility], accessed from [facility], even though the hospital's physical address listed is [address]      Please follow these instructions carefully (unless otherwise directed):  An IV will be required for this test and Nitroglycerin will be given.    Please  have lab (BMP)  done at least week prior to test   On the Night Before the Test: Be sure to Drink plenty of water. Do not consume any caffeinated/decaffeinated beverages or chocolate 12 hours prior to your test. Do not take any antihistamines 12 hours prior to your test.   On the Day of the Test: Drink plenty of water until 1 hour prior to the test. Do not eat any food 1 hour prior to test. You may take your regular medications prior to the test.  Take metoprolol (Lopressor)  50  mg (Do not take regular dose of medication ) two hours prior to test. FEMALES- please wear underwire-free bra if available, avoid dresses & tight clothing        After the Test: Drink plenty of water. After receiving IV contrast, you may experience a mild flushed feeling. This is  normal. On occasion, you may experience a mild rash up to 24 hours after the test. This is not dangerous. If this occurs, you can take Benadryl 25 mg and increase your fluid intake. If you experience trouble breathing, this can be serious. If it is severe call 911 IMMEDIATELY. If it is mild, please call our office.  We will call to schedule your test 2-4 weeks out understanding that some insurance companies will need an authorization prior to the service being performed.   For more information and frequently asked questions, please visit our  website : [email or web address]  For non-scheduling related questions, please contact the cardiac imaging nurse navigator should you have any questions/concerns: Cardiac Imaging Nurse Navigators Direct Office Dial: [phone]   For scheduling needs, including cancellations and rescheduling, please call [place], [phone].

## 2023-03-30 NOTE — Progress Notes (Unsigned)
Cardiology Office Note:  .   Date:  03/31/2023  ID:  [name], DOB [birth year], MRN [id number] PCP: Doreene Nest, NP  [facility] Providers Cardiologist:  Bryan Lemma, MD     Chief Complaint  Patient presents with   Follow-up    Delayed follow-up-18 months.:  Notes chest pain and palpitations    Patient Profile: .     Abigail [name] is an obese  58 y.o. female former smoker with a PMH notable for HTN. HLD & Palpitations (symptomatic PVCs) who presents here for ~ 18 month f/u to discuss Chest Pain & Palpitations at the request of Doreene Nest, NP.   Abigail [name] was last seen back in July 2023 when she was recovering from a URI earlier that month.  She had lost about 40 pounds probably because of being ill.  Was happy with the weight loss, but concerned about regaining.  Unfortunately she had run out of her medications including metoprolol and rosuvastatin because her appointment ended late.  She was noticing more palpitations being off of her beta-blocker.  She was actually taking one half a pill twice daily of Lopressor. Meds were refilled, otherwise no changes.  Subjective  Discussed the use of AI scribe software for clinical note transcription with the patient, who gave verbal consent to proceed.  History of Present Illness   The patient, with a history of HTN, HLD as well as recent cholecystectomy and kidney stones, presents with concerns about chest discomfort and palpitations. The chest discomfort, described as a sensation of closure in the chest, has been occurring intermittently for approximately a month, both at rest and during activity. The discomfort is localized in the precordial region and has been severe enough to awaken the patient from sleep. The patient also reported episodes of shortness of breath, both during conversation and after mild exertion, such as walking from the church to the car.  In addition to the chest  discomfort, the patient has been experiencing palpitations, described as a sudden sensation of being at the top of a roller coaster. These episodes have occurred four to five times in the past month, each lasting only a few seconds. The patient is currently on metoprolol for this condition.  The patient also reported a history of congestion and coughing up mucus, particularly in the mornings. This has been ongoing since a cold in December 2023. The patient has a history of diabetes and high cholesterol, which is currently managed with Rosuvastatin. The patient expressed a desire to discontinue this medication, citing a preference to be on fewer medications overall.  In 2023, the patient underwent a CT scan due to the discovery of pulmonary nodules. The patient has a history of smoking, although this was reported to have ceased many years ago. The patient recently underwent another CT scan, the results of which were not available at the time of the consultation. The patient is scheduled for a follow-up with their primary care provider in March 2025.      Cardiovascular ROS: positive for - chest pain, irregular heartbeat, palpitations, shortness of breath, and chest pain can be with or without exertion, can sometimes take her breath away.  Lasts seconds to several minutes. negative for - dyspnea on exertion, edema, orthopnea, paroxysmal nocturnal dyspnea, rapid heart rate, or syncope or near syncope, TIA or amaurosis fugax, claudication   Objective   Current Meds  Medication Sig   Accu-Chek FastClix Lancets MISC USE 1 LANCET TO CHECK  BLOOD SUGAR THREE TIMES DAILY   ADVIL 200 MG tablet Take 200-600 mg by mouth every 6 (six) hours as needed for mild pain or headache.   albuterol (PROAIR HFA) 108 (90 Base) MCG/ACT inhaler Inhale 2 puffs into the lungs every 6 (six) hours as needed for wheezing or shortness of breath.   Aspirin-Caffeine (BAYER BACK & BODY PO) Take 1 tablet by mouth daily as needed (body  pain).   benzonatate (TESSALON) 200 MG capsule Take 1 capsule (200 mg total) by mouth 3 (three) times daily as needed for cough.   Blood Glucose Monitoring Suppl (ACCU-CHEK GUIDE) w/Device KIT by Does not apply route.   chlorpheniramine-HYDROcodone (TUSSIONEX) 10-8 MG/5ML Take 5 mLs by mouth every 12 (twelve) hours as needed.   Cholecalciferol (VITAMIN D3) 1.25 MG (50000 UT) CAPS Take 1 capsule by mouth once a week   clotrimazole (LOTRIMIN) 1 % cream Apply to affected area 2 times daily   esomeprazole (NEXIUM) 40 MG capsule TAKE 1 CAPSULE BY MOUTH DAILY   FLUoxetine (PROZAC) 40 MG capsule TAKE 1 CAPSULE BY MOUTH ONCE DAILY FOR ANXIETY   HYDROcodone-acetaminophen (NORCO/VICODIN) 5-325 MG tablet Take 1 tablet by mouth every 6 (six) hours as needed.   Lancets Misc. (ACCU-CHEK FASTCLIX LANCET) KIT Use as directed to test blood sugar daily   levothyroxine (SYNTHROID) 75 MCG tablet TAKE 1 TABLET BY MOUTH IN THE MORNING ON  AN  EMPTY  STOMACH  WITH  WATER  ONLY  NO  FOOD  OR  OTHER  MEDICATIONS  FOR  30  MINUTES   LINZESS 145 MCG CAPS capsule Take 1 capsule by mouth at bedtime   ondansetron (ZOFRAN-ODT) 4 MG disintegrating tablet Take 1 tablet (4 mg total) by mouth every 8 (eight) hours as needed for nausea or vomiting.   oxybutynin (DITROPAN-XL) 10 MG 24 hr tablet Take 10 mg by mouth at bedtime.   rosuvastatin (CRESTOR) 20 MG tablet Take 1 tablet (20 mg total) by mouth daily. Please keep upcoming appointment on 03/30/23 in order to receive future refills.   sulfamethoxazole-trimethoprim (BACTRIM DS) 800-160 MG tablet Take 1 tablet by mouth 2 (two) times daily. For urinary tract infection.   tamsulosin (FLOMAX) 0.4 MG CAPS capsule Take 1 capsule (0.4 mg total) by mouth daily. As needed for kidney stone   traMADol (ULTRAM) 50 MG tablet Take 50 mg by mouth every 4 (four) hours as needed.   traMADol (ULTRAM) 50 MG tablet Take 1-2 tablets (50-100 mg total) by mouth every 6 (six) hours as needed (pain).    [DISCONTINUED] metoprolol tartrate (LOPRESSOR) 25 MG tablet Take 1 tablet (25 mg total) by mouth 2 (two) times daily. PT. MUST MAKE AN APPOINTMENT IN ORDER TO RECEIVE FUTURE REFILLS. SECOND ATTEMPT.    Studies Reviewed: [name]   EKG Interpretation Date/Time:  Tuesday March 30 2023 16:33:31 EST Ventricular Rate:  63 PR Interval:  146 QRS Duration:  68 QT Interval:  402 QTC Calculation: 411 R Axis:   -8  Text Interpretation: Normal sinus rhythm Inferior infarct , age undetermined ST & T wave abnormality, consider anterolateral ischemia When compared with ECG of 23-Oct-2022 09:46, T wave inversion more evident in Anterolateral leads QT has shortened Confirmed by Bryan Lemma ([id number]) on 03/30/2023 5:48:59 PM    Previously Reviewed Studies: ECHO: Normal LV size and function.  EF 60 to 65%.  No RWMA.  Unable to assess diastolic function.  Trivial AI and TR.  Peak PA pressures estimated 30 to mmHg.  Lab Results  Component Value Date   CHOL 132 05/29/2022   HDL 41 (L) 05/29/2022   LDLCALC 68 05/29/2022   TRIG 145 05/29/2022   CHOLHDL 3.2 05/29/2022   Lab Results  Component Value Date   NA 141 01/27/2023   K 3.6 01/27/2023   CREATININE 0.80 01/27/2023   GFR 95.70 11/23/2022   GLUCOSE 96 01/27/2023      Latest Ref Rng & Units 01/27/2023    3:19 PM 11/23/2022    9:57 AM 10/21/2022    4:01 PM  CBC  WBC 4.0 - 10.5 K/uL  10.3  9.0   Hemoglobin 12.0 - 15.0 g/dL 16.1  09.6  04.5   Hematocrit 36.0 - 46.0 % 37.0  41.1  38.4   Platelets 150.0 - 400.0 K/uL  258.0  238    Results  CT Chest 03/24/2023: Open report pending)  CTA-PE: No PE. Several calcific and Non-calcific scattered pulmonary micronodules. Bilateral hilar lymph nodes borderline enlarged. (02/2022)  Risk Assessment/Calculations:           Physical Exam:   VS:  BP 126/74 (BP Location: Left Arm, Patient Position: Sitting, Cuff Size: Normal)   Pulse 63   Ht 5\' 2"  (1.575 m)   Wt 190 lb (86.2 kg)   LMP 08/12/2015 Comment:  patient states hx. tubal ligation  SpO2 97%   BMI 34.75 kg/m    Wt Readings from Last 3 Encounters:  03/30/23 190 lb (86.2 kg)  01/27/23 185 lb (83.9 kg)  12/04/22 190 lb (86.2 kg)  July 2023: Weighed 172 lb 6.4 oz  GEN: Well nourished, well groomed in no acute distress; moderately obese-gained back weight that she had previously lost. NECK: No JVD; No carotid bruits CARDIAC: Normal S1, S2; RRR, no murmurs, rubs, gallops; diffuse parasternal tenderness to palpation along the left and right sternal borders but more on the left ribs 2 3 and 4. RESPIRATORY:  Clear to auscultation without rales, wheezing or rhonchi ; nonlabored, good air movement. ABDOMEN: Soft, non-tender, non-distended EXTREMITIES:  No edema; No deformity     ASSESSMENT AND PLAN: .    Problem List Items Addressed This Visit       Cardiology Problems   Essentially normal coronaries per cath in 2012 (Chronic)   Minimal CAD by catheter 2012.  We have talked about risk ratification with Coronary Calcium Score, but but since she is actually having some chest discomfort, we will proceed with Coronary/Cardiac CT Angiography as part of ischemic evaluation.       Relevant Medications   metoprolol tartrate (LOPRESSOR) 25 MG tablet   Other Relevant Orders   Basic metabolic panel   Hyperlipidemia associated with type 2 diabetes mellitus (HCC) (Chronic)   Patient's cholesterol levels were well-controlled as of last check in March 2024. Patient is currently on rosuvastatin 20 mg daily, but wishes to discontinue it. -Advise patient to continue rosuvastatin until results of coronary CT angiography are known. -Plan for cholesterol levels to be checked at next visit with Vernona Rieger, MD in March 2025.  Last A1c was 5.7 on no medications.  That was over a year ago and she has gained weight.  Likely for reevaluation in March 2025      Relevant Medications   metoprolol tartrate (LOPRESSOR) 25 MG tablet   Other Relevant Orders    EKG 12-Lead (Completed)   PVC's (premature ventricular contractions) (Chronic)   Patient reports occasional palpitations, described as feeling like being at the top of a roller coaster. Patient is currently  on metoprolol. -Advise patient to take an extra dose of metoprolol on days when palpitations are particularly bothersome.      Relevant Medications   metoprolol tartrate (LOPRESSOR) 25 MG tablet     Other   Moderate obesity (Chronic)   Nonspecific abnormal electrocardiogram (ECG) (EKG)   Relevant Orders   Basic metabolic panel   Precordial pain - Primary   Recurrent chest pain for the past month, both at rest and with exertion. Pain is reproducible on palpation, suggesting costochondritis. However, given the patient's history of diabetes and high cholesterol, and the presence of T wave inversions on EKG, there is a need to rule out coronary artery disease.  -Order coronary CT angiography to evaluate for coronary artery disease. -If the CT angiography is abnormal, patient to return for follow-up with a PA. -If the CT angiography is normal, patient to follow-up after seeing Vernona Rieger, MD (presumably a primary care provider or endocrinologist) in the summer. -Advise patient to take Aleve for chest wall pain if CT angiography is normal.      Relevant Orders   Basic metabolic panel   CT CORONARY MORPH W/CTA COR W/SCORE W/CA W/CM &/OR WO/CM   Pulmonary nodules (Chronic)   Pulmonary nodules were identified on a CT scan in December 2023. A recent CT scan was performed to evaluate these nodules, but results are not yet available to review.       General Health Maintenance -Advise patient to take a quarter of a full aspirin daily until results of coronary CT angiography are known.  Follow-Up: Return in about 5 months (around 08/28/2023) for Routine follow up with me.  For follow-up earlier pending results of Coronary CTA  Total time spent: 24 min spent with patient + 17 min spent  charting = 41 min I spent * minutes in the care of [employer] today including reviewing labs (2 minutes), reviewing studies (4 6 minutes), face to face time discussing treatment options (24 minutes), reviewing records from previous clinic visits and recent hospitalization/operations (4 minutes), 7 minutes dictating, and documenting in the encounter.     Signed, Marykay Lex, MD, MS Bryan Lemma, M.D., M.S. Interventional Cardiologist  [facility]  Pager # [phone] Phone # [phone] [address] [place], [place] [address]

## 2023-03-31 ENCOUNTER — Encounter: Payer: Self-pay | Admitting: Cardiology

## 2023-03-31 NOTE — Assessment & Plan Note (Addendum)
Patient's cholesterol levels were well-controlled as of last check in March 2024. Patient is currently on rosuvastatin 20 mg daily, but wishes to discontinue it. -Advise patient to continue rosuvastatin until results of coronary CT angiography are known. -Plan for cholesterol levels to be checked at next visit with Vernona Rieger, MD in March 2025.  Last A1c was 5.7 on no medications.  That was over a year ago and she has gained weight.  Likely for reevaluation in March 2025

## 2023-03-31 NOTE — Assessment & Plan Note (Signed)
Minimal CAD by catheter 2012.  We have talked about risk ratification with Coronary Calcium Score, but but since she is actually having some chest discomfort, we will proceed with Coronary/Cardiac CT Angiography as part of ischemic evaluation.

## 2023-03-31 NOTE — Assessment & Plan Note (Signed)
Pulmonary nodules were identified on a CT scan in December 2023. A recent CT scan was performed to evaluate these nodules, but results are not yet available to review.

## 2023-03-31 NOTE — Assessment & Plan Note (Signed)
Patient reports occasional palpitations, described as feeling like being at the top of a roller coaster. Patient is currently on metoprolol. -Advise patient to take an extra dose of metoprolol on days when palpitations are particularly bothersome.

## 2023-03-31 NOTE — Assessment & Plan Note (Signed)
Recurrent chest pain for the past month, both at rest and with exertion. Pain is reproducible on palpation, suggesting costochondritis. However, given the patient's history of diabetes and high cholesterol, and the presence of T wave inversions on EKG, there is a need to rule out coronary artery disease.  -Order coronary CT angiography to evaluate for coronary artery disease. -If the CT angiography is abnormal, patient to return for follow-up with a PA. -If the CT angiography is normal, patient to follow-up after seeing Vernona Rieger, MD (presumably a primary care provider or endocrinologist) in the summer. -Advise patient to take Aleve for chest wall pain if CT angiography is normal.

## 2023-04-02 ENCOUNTER — Other Ambulatory Visit: Payer: Self-pay | Admitting: Cardiology

## 2023-04-02 LAB — BASIC METABOLIC PANEL
BUN/Creatinine Ratio: 14 (ref 9–23)
BUN: 8 mg/dL (ref 6–24)
CO2: 25 mmol/L (ref 20–29)
Calcium: 9 mg/dL (ref 8.7–10.2)
Chloride: 104 mmol/L (ref 96–106)
Creatinine, Ser: 0.58 mg/dL (ref 0.57–1.00)
Glucose: 116 mg/dL — ABNORMAL HIGH (ref 70–99)
Potassium: 4.3 mmol/L (ref 3.5–5.2)
Sodium: 142 mmol/L (ref 134–144)
eGFR: 105 mL/min/{1.73_m2} (ref >59)

## 2023-04-05 ENCOUNTER — Encounter: Payer: Self-pay | Admitting: Cardiology

## 2023-04-07 ENCOUNTER — Telehealth (HOSPITAL_COMMUNITY): Payer: Self-pay | Admitting: *Deleted

## 2023-04-07 ENCOUNTER — Other Ambulatory Visit: Payer: Self-pay | Admitting: Primary Care

## 2023-04-07 DIAGNOSIS — E559 Vitamin D deficiency, unspecified: Secondary | ICD-10-CM

## 2023-04-07 NOTE — Telephone Encounter (Signed)
Attempted to call patient regarding upcoming cardiac CT appointment. Left message on voicemail with name and callback number  Larey Brick RN Navigator Cardiac Imaging [facility] [phone] Office [phone] Cell

## 2023-04-07 NOTE — Telephone Encounter (Signed)
Patient returning call about her upcoming cardiac imaging study; pt verbalizes understanding of appt date/time, parking situation and where to check in, pre-test NPO status and medications ordered, and verified current allergies; name and call back number provided for further questions should they arise  Larey Brick RN Navigator Cardiac Imaging [facility] [phone] office [phone] cell  Patient to take 50mg  metoprolol tartrate two hours prior to her cardiac CT scan. She is aware to arrive at 7:30 AM.

## 2023-04-09 ENCOUNTER — Ambulatory Visit (HOSPITAL_COMMUNITY)
Admission: RE | Admit: 2023-04-09 | Discharge: 2023-04-09 | Disposition: A | Discharge status: Home / Self Care | Payer: 59 | Source: Ambulatory Visit | Attending: Cardiology | Admitting: Cardiology

## 2023-04-09 ENCOUNTER — Encounter: Payer: Self-pay | Admitting: Cardiology

## 2023-04-09 DIAGNOSIS — R072 Precordial pain: Secondary | ICD-10-CM | POA: Diagnosis present

## 2023-04-09 MED ORDER — NITROGLYCERIN 0.4 MG SL SUBL
0.8000 mg | SUBLINGUAL_TABLET | Freq: Once | SUBLINGUAL | Status: AC
  Administered 2023-04-09: 0.8 mg via SUBLINGUAL

## 2023-04-09 MED ORDER — NITROGLYCERIN 0.4 MG SL SUBL
SUBLINGUAL_TABLET | SUBLINGUAL | Status: AC
  Filled 2023-04-09: qty 2

## 2023-04-09 MED ORDER — IOHEXOL 350 MG/ML SOLN
95.0000 mL | Freq: Once | INTRAVENOUS | Status: AC | PRN
  Administered 2023-04-09: 95 mL via INTRAVENOUS

## 2023-04-12 ENCOUNTER — Other Ambulatory Visit: Payer: Self-pay | Admitting: Gastroenterology

## 2023-04-21 ENCOUNTER — Telehealth: Payer: Self-pay | Admitting: Gastroenterology

## 2023-04-21 ENCOUNTER — Other Ambulatory Visit: Payer: Self-pay

## 2023-04-21 ENCOUNTER — Encounter (HOSPITAL_COMMUNITY): Payer: Self-pay

## 2023-04-21 ENCOUNTER — Emergency Department (HOSPITAL_COMMUNITY)
Admission: EM | Admit: 2023-04-21 | Discharge: 2023-04-21 | Disposition: A | Discharge status: Home / Self Care | Payer: 59 | Attending: Emergency Medicine | Admitting: Emergency Medicine

## 2023-04-21 DIAGNOSIS — K224 Dyskinesia of esophagus: Secondary | ICD-10-CM | POA: Insufficient documentation

## 2023-04-21 DIAGNOSIS — Z91040 Latex allergy status: Secondary | ICD-10-CM | POA: Insufficient documentation

## 2023-04-21 DIAGNOSIS — E039 Hypothyroidism, unspecified: Secondary | ICD-10-CM | POA: Insufficient documentation

## 2023-04-21 DIAGNOSIS — R079 Chest pain, unspecified: Secondary | ICD-10-CM

## 2023-04-21 DIAGNOSIS — I10 Essential (primary) hypertension: Secondary | ICD-10-CM | POA: Insufficient documentation

## 2023-04-21 DIAGNOSIS — E119 Type 2 diabetes mellitus without complications: Secondary | ICD-10-CM | POA: Insufficient documentation

## 2023-04-21 DIAGNOSIS — Z7982 Long term (current) use of aspirin: Secondary | ICD-10-CM | POA: Insufficient documentation

## 2023-04-21 LAB — COMPREHENSIVE METABOLIC PANEL
ALT: 30 U/L (ref 0–44)
AST: 25 U/L (ref 15–41)
Albumin: 3.6 g/dL (ref 3.5–5.0)
Alkaline Phosphatase: 85 U/L (ref 38–126)
Anion gap: 17 — ABNORMAL HIGH (ref 5–15)
BUN: 8 mg/dL (ref 6–20)
CO2: 22 mmol/L (ref 22–32)
Calcium: 9.6 mg/dL (ref 8.9–10.3)
Chloride: 100 mmol/L (ref 98–111)
Creatinine, Ser: 0.66 mg/dL (ref 0.44–1.00)
GFR, Estimated: >60 mL/min (ref >60)
Glucose, Bld: 102 mg/dL — ABNORMAL HIGH (ref 70–99)
Potassium: 3.7 mmol/L (ref 3.5–5.1)
Sodium: 139 mmol/L (ref 135–145)
Total Bilirubin: 1.1 mg/dL (ref 0.0–1.2)
Total Protein: 6.5 g/dL (ref 6.5–8.1)

## 2023-04-21 LAB — CBC WITH DIFFERENTIAL/PLATELET
Abs Immature Granulocytes: 0.03 10*3/uL (ref 0.00–0.07)
Basophils Absolute: 0.1 10*3/uL (ref 0.0–0.1)
Basophils Relative: 1 %
Eosinophils Absolute: 0.5 10*3/uL (ref 0.0–0.5)
Eosinophils Relative: 6 %
HCT: 40.9 % (ref 36.0–46.0)
Hemoglobin: 13.5 g/dL (ref 12.0–15.0)
Immature Granulocytes: 0 %
Lymphocytes Relative: 25 %
Lymphs Abs: 2 10*3/uL (ref 0.7–4.0)
MCH: 28.9 pg (ref 26.0–34.0)
MCHC: 33 g/dL (ref 30.0–36.0)
MCV: 87.6 fL (ref 80.0–100.0)
Monocytes Absolute: 0.5 10*3/uL (ref 0.1–1.0)
Monocytes Relative: 6 %
Neutro Abs: 5 10*3/uL (ref 1.7–7.7)
Neutrophils Relative %: 62 %
Platelets: 283 10*3/uL (ref 150–400)
RBC: 4.67 MIL/uL (ref 3.87–5.11)
RDW: 13.3 % (ref 11.5–15.5)
WBC: 8 10*3/uL (ref 4.0–10.5)
nRBC: 0 % (ref 0.0–0.2)

## 2023-04-21 LAB — TROPONIN I (HIGH SENSITIVITY): Troponin I (High Sensitivity): <2 ng/L (ref <18)

## 2023-04-21 LAB — LIPASE, BLOOD: Lipase: 29 U/L (ref 11–51)

## 2023-04-21 MED ORDER — PANTOPRAZOLE SODIUM 20 MG PO TBEC: 20.0000 mg | DELAYED_RELEASE_TABLET | Freq: Every day | ORAL | 2 refills | Status: DC

## 2023-04-21 MED ORDER — SUCRALFATE 1 G PO TABS: 1.0000 g | ORAL_TABLET | Freq: Three times a day (TID) | ORAL | 0 refills | Status: DC

## 2023-04-21 MED ORDER — ALUM & MAG HYDROXIDE-SIMETH 200-200-20 MG/5ML PO SUSP
30.0000 mL | Freq: Once | ORAL | Status: AC
  Administered 2023-04-21: 30 mL via ORAL
  Filled 2023-04-21: qty 30

## 2023-04-21 NOTE — ED Provider Notes (Signed)
[name] EMERGENCY DEPARTMENT AT [facility] Provider Note   CSN: [id number] Arrival date & time: 04/21/23  7829     History  Chief Complaint  Patient presents with   Chest Pain    Abigail [name] is a 58 y.o. female.  Patient is a 58 year old female with a history of GERD, heart disease, hypertension, diabetes, hypothyroidism and PVCs who is presenting today due to complaint of chest pain.  Patient has been having ongoing issues with chest pain where it will be in her chest and even at times will radiate into her arm and cause tingling down her arm.  This has been intermittent for over a month now.  She reports is all gotten worse since she had URI symptoms in December.  She is having intermittent cough and congestion as well as mucus production.  It did initially get better and then she reports a few weeks ago she was having worsening cough with congestion again and was using her inhaler which did help bring up some of the mucus.  She has followed up with her cardiologist Dr. Herbie Baltimore who evaluated her in the office and also got a cardiac/coronary CT done which has been read and reports that there is mild disease and most likely the cause of her pain is something noncardiac.  Patient also reports that she has been on Prilosec for years but the pain she experienced today while she was sitting at her desk felt like this sensation she has had before like food is getting stuck in the center of her chest.  She has some nausea associated with it but does not have any vomiting.  She also describes some mild epigastric pain as well.  She reports this symptom started about 7:00 this morning that was after she had breakfast about an hour earlier and took all of her medications.  She denies any recent medication changes.  The history is provided by the patient.  Chest Pain      Home Medications Prior to Admission medications   Medication Sig Start Date End Date Taking? Authorizing  Provider  pantoprazole (PROTONIX) 20 MG tablet Take 1 tablet (20 mg total) by mouth daily. 04/21/23  Yes Gwyneth Sprout, MD  sucralfate (CARAFATE) 1 g tablet Take 1 tablet (1 g total) by mouth 4 (four) times daily -  with meals and at bedtime. 04/21/23  Yes Gwyneth Sprout, MD  Accu-Chek FastClix Lancets MISC USE 1 LANCET TO CHECK BLOOD SUGAR THREE TIMES DAILY 06/24/21   Doreene Nest, NP  ADVIL 200 MG tablet Take 200-600 mg by mouth every 6 (six) hours as needed for mild pain or headache.    [provider]  albuterol (PROAIR HFA) 108 (90 Base) MCG/ACT inhaler Inhale 2 puffs into the lungs every 6 (six) hours as needed for wheezing or shortness of breath. 07/01/22   Doreene Nest, NP  Aspirin-Caffeine (BAYER BACK & BODY PO) Take 1 tablet by mouth daily as needed (body pain).    [provider]  benzonatate (TESSALON) 200 MG capsule Take 1 capsule (200 mg total) by mouth 3 (three) times daily as needed for cough. 12/04/22   Doreene Nest, NP  Blood Glucose Monitoring Suppl (ACCU-CHEK GUIDE) w/Device KIT by Does not apply route.    [provider]  chlorpheniramine-HYDROcodone (TUSSIONEX) 10-8 MG/5ML Take 5 mLs by mouth every 12 (twelve) hours as needed. 12/07/22   Bedsole, Amy E, MD  Cholecalciferol (VITAMIN D3) 1.25 MG (50000 UT) CAPS  Take 1 capsule by mouth once a week 04/07/23   Doreene Nest, NP  clotrimazole (LOTRIMIN) 1 % cream Apply to affected area 2 times daily 03/06/22   Rolan Bucco, MD  FLUoxetine (PROZAC) 40 MG capsule TAKE 1 CAPSULE BY MOUTH ONCE DAILY FOR ANXIETY 03/26/23   Doreene Nest, NP  HYDROcodone-acetaminophen (NORCO/VICODIN) 5-325 MG tablet Take 1 tablet by mouth every 6 (six) hours as needed. 11/10/22   [provider]  Lancets Misc. (ACCU-CHEK FASTCLIX LANCET) KIT Use as directed to test blood sugar daily 10/03/19   Doreene Nest, NP  levothyroxine (SYNTHROID) 75 MCG tablet TAKE 1 TABLET BY MOUTH IN THE MORNING ON   AN  EMPTY  STOMACH  WITH  WATER  ONLY  NO  FOOD  OR  OTHER  MEDICATIONS  FOR  30  MINUTES 06/23/22   Doreene Nest, NP  LINZESS 145 MCG CAPS capsule Take 1 capsule by mouth at bedtime 04/12/23   Napoleon Form, MD  metoprolol tartrate (LOPRESSOR) 25 MG tablet Take 1 tablet (25 mg total) by mouth 2 (two) times daily. 03/30/23   Marykay Lex, MD  ondansetron (ZOFRAN-ODT) 4 MG disintegrating tablet Take 1 tablet (4 mg total) by mouth every 8 (eight) hours as needed for nausea or vomiting. 11/24/22   Dana Allan, MD  oxybutynin (DITROPAN-XL) 10 MG 24 hr tablet Take 10 mg by mouth at bedtime.    [provider]  rosuvastatin (CRESTOR) 20 MG tablet TAKE 1 TABLET BY MOUTH ONCE DAILY .  PLEASE  KEEP  UPCOMING  APPOINTMENT  ON  03/30/23  IN  ORDER  TO  RECEIVE  FUTURE  REFILLS 04/02/23   Marykay Lex, MD  sulfamethoxazole-trimethoprim (BACTRIM DS) 800-160 MG tablet Take 1 tablet by mouth 2 (two) times daily. For urinary tract infection. 12/04/22   Doreene Nest, NP  tamsulosin (FLOMAX) 0.4 MG CAPS capsule Take 1 capsule (0.4 mg total) by mouth daily. As needed for kidney stone 11/24/22   Dana Allan, MD  traMADol (ULTRAM) 50 MG tablet Take 50 mg by mouth every 4 (four) hours as needed. 09/05/21   [provider]  traMADol (ULTRAM) 50 MG tablet Take 1-2 tablets (50-100 mg total) by mouth every 6 (six) hours as needed (pain). 01/27/23   Heloise Purpura, MD      Allergies    Latex and Penicillins    Review of Systems   Review of Systems  Cardiovascular:  Positive for chest pain.    Physical Exam Updated Vital Signs BP 107/70   Pulse 65   Temp 98.3 F (36.8 C) (Oral)   Resp 20   Ht 5\' 2"  (1.575 m)   Wt 84.8 kg   LMP 08/12/2015 Comment: patient states hx. tubal ligation  SpO2 96%   BMI 34.20 kg/m  Physical Exam Vitals and nursing note reviewed.  Constitutional:      General: She is not in acute distress.    Appearance: She is well-developed.  HENT:     Head:  Normocephalic and atraumatic.  Eyes:     Pupils: Pupils are equal, round, and reactive to light.  Cardiovascular:     Rate and Rhythm: Normal rate and regular rhythm.     Pulses: Normal pulses.     Heart sounds: Normal heart sounds. No murmur heard.    No friction rub.  Pulmonary:     Effort: Pulmonary effort is normal.     Breath sounds: Normal breath sounds. No  wheezing or rales.  Abdominal:     General: Bowel sounds are normal. There is no distension.     Palpations: Abdomen is soft.     Tenderness: There is abdominal tenderness in the epigastric area. There is no right CVA tenderness, left CVA tenderness, guarding or rebound.  Musculoskeletal:        General: No tenderness. Normal range of motion.     Comments: No edema  Skin:    General: Skin is warm and dry.     Findings: No rash.  Neurological:     Mental Status: She is alert and oriented to person, place, and time. Mental status is at baseline.     Cranial Nerves: No cranial nerve deficit.  Psychiatric:        Behavior: Behavior normal.     ED Results / Procedures / Treatments   Labs (all labs ordered are listed, but only abnormal results are displayed) Labs Reviewed  COMPREHENSIVE METABOLIC PANEL - Abnormal; Notable for the following components:      Result Value   Glucose, Bld 102 (*)    Anion gap 17 (*)    All other components within normal limits  CBC WITH DIFFERENTIAL/PLATELET  LIPASE, BLOOD  TROPONIN I (HIGH SENSITIVITY)    EKG EKG Interpretation Date/Time:  Wednesday April 21 2023 10:01:11 EST Ventricular Rate:  65 PR Interval:  158 QRS Duration:  66 QT Interval:  418 QTC Calculation: 434 R Axis:   -24  Text Interpretation: Normal sinus rhythm Cannot rule out Anterior infarct , age undetermined T wave inversion less pronounced and almost resolved compared to prior studies When compared with ECG of 30-Mar-2023 16:33, PREVIOUS ECG IS PRESENT Confirmed by Gwyneth Sprout ([id number]) on 04/21/2023  10:13:09 AM  Radiology No results found.  Procedures Procedures    Medications Ordered in ED Medications  alum & mag hydroxide-simeth (MAALOX/MYLANTA) 200-200-20 MG/5ML suspension 30 mL (30 mLs Oral Given 04/21/23 1018)    ED Course/ Medical Decision Making/ A&P                                 Medical Decision Making Amount and/or Complexity of Data Reviewed External Data Reviewed: notes. Labs: ordered. Decision-making details documented in ED Course. ECG/medicine tests: ordered and independent interpretation performed. Decision-making details documented in ED Course.  Risk OTC drugs. Prescription drug management.   Pt with multiple medical problems and comorbidities and presenting today with a complaint that caries a high risk for morbidity and mortality.  Here today with nonspecific chest pain.  Started at rest while she was at work and describes it as an intense pressure in the center of her chest.  Also describes it as almost feeling like something got stuck as she was swallowing but she was not eating at the time.  Symptoms have improved some but is still present.  Some nausea associated with it but no vomiting.  She has had ongoing issues with shortness of breath cough and congestion but denies any significant changes.  Denies any new fevers or new medications.  Based on evaluation of patient's outside medical records Dr. 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  I suspect patient's symptoms today are most likely GI in nature lower suspicion for CAD.  Low suspicion for pneumonia or PE.  I independently interpreted patient's EKG and labs.  EKG without acute findings and T wave inversion that was seen in the office last  month is significantly improved and almost resolved. CBC, CMP, lipase and troponin are all within normal limits today.  But troponin was drawn 3 hours after her initial onset of pain and feel that she does not need a second given that it is high-sensitivity.  Low suspicion at this time for ACS but feel patient symptoms are most likely GI in nature.  She does have a gastroenterologist.  In the meantime we will switch her PPI and start her on Carafate.  At this time she appears stable for discharge home.        Final Clinical Impression(s) / ED Diagnoses Final diagnoses:  Nonspecific chest pain  Esophageal spasm    Rx / DC Orders ED Discharge Orders          Ordered    sucralfate (CARAFATE) 1 g tablet  3 times daily with meals & bedtime        04/21/23 1215    pantoprazole (PROTONIX) 20 MG tablet  Daily        04/21/23 1215              Gwyneth Sprout, MD 04/21/23 1219

## 2023-04-21 NOTE — Discharge Instructions (Signed)
All the numbers to your heart looked normal today.  Suspect this is probably coming from your stomach and esophagus.  We will start a new stomach medicine so stopped taking the Nexium.  Also you will use the Carafate before eating and before you go to bed.

## 2023-04-21 NOTE — Telephone Encounter (Signed)
Patient requesting f/u call with nurse to discuss recent ED visit. Please advise.   Thank you

## 2023-04-21 NOTE — ED Triage Notes (Signed)
Pt BIB EMS for central chest pain and back pain that started this morning. Pt has followed up with cards and CP is unknown cause. EMS gave 324mg  of aspirin and 1 nitroglycerin. Axox4.

## 2023-04-23 ENCOUNTER — Ambulatory Visit (INDEPENDENT_AMBULATORY_CARE_PROVIDER_SITE_OTHER): Payer: 59 | Admitting: Family Medicine

## 2023-04-23 ENCOUNTER — Encounter: Payer: Self-pay | Admitting: Family Medicine

## 2023-04-23 VITALS — BP 120/66 | HR 61 | Temp 98.2°F | Resp 18 | Ht 62.0 in | Wt 189.6 lb

## 2023-04-23 DIAGNOSIS — K224 Dyskinesia of esophagus: Secondary | ICD-10-CM | POA: Diagnosis not present

## 2023-04-23 NOTE — Patient Instructions (Signed)
It was a pleasure meeting you today. Thank you for allowing me to take part in your health care.  Our goals for today as we discussed include:  Continue Protonix and Sulcrafate as prescribed.  Follow up with PCP as scheduled  Follow up with GI as scheduled   This is a list of the screening recommended for you and due dates:  Health Maintenance  Topic Date Due   Pneumococcal Vaccination (1 of 2 - PCV) Never done   Complete foot exam   Never done   Hepatitis C Screening  Never done   Zoster (Shingles) Vaccine (1 of 2) Never done   Hemoglobin A1C  11/29/2022   Flu Shot  06/07/2023*   Eye exam for diabetics  06/25/2023*   Yearly kidney health urinalysis for diabetes  05/29/2023   Mammogram  12/18/2023   Yearly kidney function blood test for diabetes  04/20/2024   Pap with HPV screening  05/29/2025   Colon Cancer Screening  06/23/2025   DTaP/Tdap/Td vaccine (2 - Td or Tdap) 05/24/2031   HIV Screening  Completed   HPV Vaccine  Aged Out   COVID-19 Vaccine  Discontinued  *Topic was postponed. The date shown is not the original due date.     If you have any questions or concerns, please do not hesitate to call the office at [phone].  I look forward to our next visit and until then take care and stay safe.  Regards,   Dana Allan, MD   [facility]

## 2023-04-23 NOTE — Telephone Encounter (Signed)
Patient taken to the ER via ambulance with chest pain. Cardiac causes were ruled out. Patient says she was told possible esophageal spasm. She also has reflux. Given Carafate and Protonix. She will see her PCP today. Appointment for 06/09/23 scheduled. Patient will call if she acutely worsens, develops new symptoms or has questions.

## 2023-04-23 NOTE — Progress Notes (Signed)
 SUBJECTIVE:   Chief Complaint  Patient presents with   Follow-up    Chest pain   HPI Presents for follow up hospital visit  Discussed the use of AI scribe software for clinical note transcription with the patient, who gave verbal consent to proceed.  History of Present Illness Abigail [name] is a 58 year old female who presents with chest pain.  She has been experiencing chest pain since December, which prompted a visit to the emergency room after consulting with her cardiologist. The pain is located in the chest and radiates to the back. In the emergency department, she was informed that the pain might be due to esophageal spasms, possibly related to her history of gallbladder removal. She has no history of myocardial infarction.  In 2023, pulmonary nodules were identified, and she has since undergone CT scans as part of her follow-up care. She had a recent follow-up with her cardiologist after an 82-month gap, during which another CT scan was performed.  Her current medications include metoprolol for her heart condition, which she describes as a situation where 'the bottom part of my heart beats faster.' She was recently started on Carafate (sucralfate) four times a day, with doses taken with meals and before bed, and Protonix (pantoprazole) 20 mg, which she began on Wednesday. Her daily medications also include rosuvastatin, oxybutynin, fluoxetine, and Linzess. She uses Flomax as needed for kidney stones and reports occasional use of Advil. No regular use of ibuprofen or other NSAIDs.  Dietary habits include the consumption of Activia yogurt, water, hot tea, and unsweetened iced tea. She occasionally drinks alcohol and soda, preferring Sprite Zero. She avoids smoking and reports no issues with bowel or urinary function.   PERTINENT PMH / PSH: As above  OBJECTIVE:  BP 120/66   Pulse 61   Temp 98.2 F (36.8 C)   Resp 18   Ht 5\' 2"  (1.575 m)   Wt 189 lb 9 oz (86 kg)   LMP  08/12/2015 Comment: patient states hx. tubal ligation  SpO2 97%   BMI 34.67 kg/m    Physical Exam Vitals reviewed.  Constitutional:      General: She is not in acute distress.    Appearance: Normal appearance. She is obese. She is not ill-appearing, toxic-appearing or diaphoretic.  Eyes:     General:        Right eye: No discharge.        Left eye: No discharge.     Conjunctiva/sclera: Conjunctivae normal.  Cardiovascular:     Rate and Rhythm: Normal rate.  Pulmonary:     Effort: Pulmonary effort is normal.  Musculoskeletal:        General: Normal range of motion.  Skin:    General: Skin is warm and dry.  Neurological:     General: No focal deficit present.     Mental Status: She is alert and oriented to person, place, and time. Mental status is at baseline.  Psychiatric:        Mood and Affect: Mood normal.        Behavior: Behavior normal.        Thought Content: Thought content normal.        Judgment: Judgment normal.           04/23/2023    2:35 PM 11/23/2022    9:06 AM 08/26/2022   10:43 AM 05/29/2022    2:31 PM 05/23/2021    7:35 AM  Depression screen PHQ 2/9  Decreased Interest 2 2 3 3 3   Down, Depressed, Hopeless 2 1 3 3 3   PHQ - 2 Score 4 3 6 6 6   Altered sleeping 2 0 0 2 1  Tired, decreased energy 2 0 3 2 3   Change in appetite 3 0 2 2 3   Feeling bad or failure about yourself  2 2 2 1 1   Trouble concentrating 2 1 2 2 3   Moving slowly or fidgety/restless 0  0 2 0  Suicidal thoughts 0 0 0 0 0  PHQ-9 Score 15 6 15 17 17   Difficult doing work/chores Somewhat difficult Somewhat difficult Somewhat difficult Extremely dIfficult Somewhat difficult      04/23/2023    2:35 PM 11/23/2022    9:06 AM 08/26/2022   10:43 AM  GAD 7 : Generalized Anxiety Score  Nervous, Anxious, on Edge 0 2 2  Control/stop worrying 2 2 3   Worry too much - different things 2 2 2   Trouble relaxing 2 2 2   Restless 2 1 1   Easily annoyed or irritable 0 1 2  Afraid - awful might happen  0 1 2  Total GAD 7 Score 8 11 14   Anxiety Difficulty Somewhat difficult Somewhat difficult Somewhat difficult    ASSESSMENT/PLAN:  Esophageal spasm Assessment & Plan: Likely esophageal spasm as per ED assessment. Recent cardiac workup negative.No gallbladder present which may contribute to symptoms. Patient recently started on Carafate and Protonix. Not currently having pain.   -Continue Carafate and Protonix as prescribed. -Consider dietary modifications to reduce acid reflux triggers. -Follow up with GI specialist for further evaluation, possibly including EGD. -Consider switching Metoprolol to CCB in future if indicated     PDMP reviewed  Return if symptoms worsen or fail to improve, for PCP.  Dana Allan, MD

## 2023-05-02 ENCOUNTER — Encounter: Payer: Self-pay | Admitting: Family Medicine

## 2023-05-02 DIAGNOSIS — K224 Dyskinesia of esophagus: Secondary | ICD-10-CM | POA: Insufficient documentation

## 2023-05-02 NOTE — Assessment & Plan Note (Addendum)
 Likely esophageal spasm as per ED assessment. Recent cardiac workup negative.No gallbladder present which may contribute to symptoms. Patient recently started on Carafate and Protonix. Not currently having pain.   -Continue Carafate and Protonix as prescribed. -Consider dietary modifications to reduce acid reflux triggers. -Follow up with GI specialist for further evaluation, possibly including EGD. -Consider switching Metoprolol to CCB in future if indicated

## 2023-05-10 ENCOUNTER — Other Ambulatory Visit: Payer: Self-pay | Admitting: Gastroenterology

## 2023-05-14 ENCOUNTER — Telehealth: Payer: Self-pay | Admitting: Gastroenterology

## 2023-05-14 ENCOUNTER — Other Ambulatory Visit: Payer: Self-pay

## 2023-05-14 MED ORDER — HYOSCYAMINE SULFATE 0.125 MG SL SUBL: 0.1250 mg | SUBLINGUAL_TABLET | Freq: Two times a day (BID) | SUBLINGUAL | 0 refills | Status: DC | PRN

## 2023-05-14 NOTE — Telephone Encounter (Signed)
 Message left on the patient's voicemail. Prescription to [facility].

## 2023-05-14 NOTE — Telephone Encounter (Signed)
 Patient called back requesting to see if there was any way she could be seen today for her symptoms of esophageal spasms. I advised her that we did not have any openings and the soonest we had would be the appointment she is scheduled for with Dr. Lavon Paganini on 4/2. Patient request another message to be sent to nurse to give her a call and requested message to be sent as urgent. Please advise.

## 2023-05-14 NOTE — Telephone Encounter (Signed)
 Spoke with the patient. She began having the spasms earlier this week. Now she drinks water and there is pain. She reports a burning sensation from mid chest and up the esophagus. She is only taking her prescribed medications. No NSAIDs or steroids. No appointments next week. She is scheduled to come in 06/09/23.

## 2023-05-14 NOTE — Telephone Encounter (Signed)
 Inbound call to discuss esophageal spasms. She went to the ED 2/12 and it is very painful. Please advise.

## 2023-05-14 NOTE — Telephone Encounter (Signed)
 Please advise patient to limit use of NSAIDs.  Continue PPI and Carafate.  Use Levsin sublingual 0.125 mg every 12 hours as needed as needed for severe spasms.  Please send prescription for 30 days.  Follow-up in office as scheduled.  Thank you

## 2023-05-26 ENCOUNTER — Encounter (HOSPITAL_COMMUNITY): Payer: Self-pay | Admitting: Emergency Medicine

## 2023-05-26 ENCOUNTER — Other Ambulatory Visit: Payer: Self-pay

## 2023-05-26 ENCOUNTER — Emergency Department (HOSPITAL_COMMUNITY)

## 2023-05-26 ENCOUNTER — Emergency Department (HOSPITAL_COMMUNITY)
Admission: EM | Admit: 2023-05-26 | Discharge: 2023-05-26 | Disposition: A | Discharge status: Home / Self Care | Attending: Emergency Medicine | Admitting: Emergency Medicine

## 2023-05-26 DIAGNOSIS — Z7982 Long term (current) use of aspirin: Secondary | ICD-10-CM | POA: Diagnosis not present

## 2023-05-26 DIAGNOSIS — S0083XA Contusion of other part of head, initial encounter: Secondary | ICD-10-CM | POA: Insufficient documentation

## 2023-05-26 DIAGNOSIS — M542 Cervicalgia: Secondary | ICD-10-CM | POA: Diagnosis not present

## 2023-05-26 DIAGNOSIS — W01198A Fall on same level from slipping, tripping and stumbling with subsequent striking against other object, initial encounter: Secondary | ICD-10-CM | POA: Insufficient documentation

## 2023-05-26 DIAGNOSIS — S0990XA Unspecified injury of head, initial encounter: Secondary | ICD-10-CM | POA: Diagnosis present

## 2023-05-26 DIAGNOSIS — Z91040 Latex allergy status: Secondary | ICD-10-CM | POA: Insufficient documentation

## 2023-05-26 DIAGNOSIS — W19XXXA Unspecified fall, initial encounter: Secondary | ICD-10-CM

## 2023-05-26 MED ORDER — ACETAMINOPHEN 500 MG PO TABS
1000.0000 mg | ORAL_TABLET | Freq: Once | ORAL | Status: AC
  Administered 2023-05-26: 1000 mg via ORAL
  Filled 2023-05-26: qty 2

## 2023-05-26 MED ORDER — KETOROLAC TROMETHAMINE 30 MG/ML IJ SOLN
15.0000 mg | Freq: Once | INTRAMUSCULAR | Status: DC
  Filled 2023-05-26: qty 1

## 2023-05-26 MED ORDER — KETOROLAC TROMETHAMINE 15 MG/ML IJ SOLN
15.0000 mg | Freq: Once | INTRAMUSCULAR | Status: AC
  Administered 2023-05-26: 15 mg via INTRAVENOUS

## 2023-05-26 NOTE — Discharge Instructions (Signed)
 As discussed, your evaluation today has been largely reassuring.  But, it is important that you monitor your condition carefully, and do not hesitate to return to the ED if you develop new, or concerning changes in your condition.  Otherwise, please follow-up with your physician for appropriate ongoing care.  Your CT studies today were reassuring.  There was notation of a malformation which has previously been identified as well.  Today's interpretation is included below:  IMPRESSION: 1. No evidence of acute intracranial abnormality. 2. No acute cervical spine fracture or subluxation. 3. Chiari I malformation.

## 2023-05-26 NOTE — ED Provider Notes (Signed)
 [place] [facility] AT [facility] Provider Note   CSN: [id number] Arrival date & time: 05/26/23  2951     History  Chief Complaint  Patient presents with   Fall   Head Injury    Abigail [name] is a 58 y.o. female.  HPI Patient presents via EMS after a fall.  Patient was at work, reaching for something off a rack, she fell, striking her head backwards on the concrete floor.  She is unsure of loss of consciousness, does have a hematoma to the posterior of her head, has been ambulatory, denies any weakness in her extremities, does have right neck pain as well.  EMS reports no hemodynamic instability.    Home Medications Prior to Admission medications   Medication Sig Start Date End Date Taking? Authorizing Provider  Accu-Chek FastClix Lancets MISC USE 1 LANCET TO CHECK BLOOD SUGAR THREE TIMES DAILY 06/24/21   Doreene Nest, NP  ADVIL 200 MG tablet Take 200-600 mg by mouth every 6 (six) hours as needed for mild pain or headache.    [provider]  albuterol (PROAIR HFA) 108 (90 Base) MCG/ACT inhaler Inhale 2 puffs into the lungs every 6 (six) hours as needed for wheezing or shortness of breath. 07/01/22   Doreene Nest, NP  Aspirin-Caffeine ([place] BACK & BODY PO) Take 1 tablet by mouth daily as needed (body pain).    [provider]  benzonatate (TESSALON) 200 MG capsule Take 1 capsule (200 mg total) by mouth 3 (three) times daily as needed for cough. 12/04/22   Doreene Nest, NP  Blood Glucose Monitoring Suppl (ACCU-CHEK GUIDE) w/Device KIT by Does not apply route.    [provider]  chlorpheniramine-HYDROcodone (TUSSIONEX) 10-8 MG/5ML Take 5 mLs by mouth every 12 (twelve) hours as needed. 12/07/22   Excell Seltzer, MD  Cholecalciferol (VITAMIN D3) 1.25 MG (50000 UT) CAPS Take 1 capsule by mouth once a week 04/07/23   Doreene Nest, NP  clotrimazole (LOTRIMIN) 1 % cream Apply to affected area 2 times daily 03/06/22    Rolan Bucco, MD  FLUoxetine (PROZAC) 40 MG capsule TAKE 1 CAPSULE BY MOUTH ONCE DAILY FOR ANXIETY 03/26/23   Doreene Nest, NP  HYDROcodone-acetaminophen (NORCO/VICODIN) 5-325 MG tablet Take 1 tablet by mouth every 6 (six) hours as needed. 11/10/22   [provider]  hyoscyamine (LEVSIN SL) 0.125 MG SL tablet Place 1 tablet (0.125 mg total) under the tongue 2 (two) times daily as needed (spasms). 05/14/23   Napoleon Form, MD  Lancets Misc. (ACCU-CHEK FASTCLIX LANCET) KIT Use as directed to test blood sugar daily 10/03/19   Doreene Nest, NP  levothyroxine (SYNTHROID) 75 MCG tablet TAKE 1 TABLET BY MOUTH IN THE MORNING ON  AN  EMPTY  STOMACH  WITH  WATER  ONLY  NO  FOOD  OR  OTHER  MEDICATIONS  FOR  30  MINUTES 06/23/22   Doreene Nest, NP  LINZESS 145 MCG CAPS capsule Take 1 capsule by mouth at bedtime 05/10/23   Napoleon Form, MD  metoprolol tartrate (LOPRESSOR) 25 MG tablet Take 1 tablet (25 mg total) by mouth 2 (two) times daily. 03/30/23   Marykay Lex, MD  ondansetron (ZOFRAN-ODT) 4 MG disintegrating tablet Take 1 tablet (4 mg total) by mouth every 8 (eight) hours as needed for nausea or vomiting. 11/24/22   Dana Allan, MD  oxybutynin (DITROPAN-XL) 10 MG 24 hr tablet Take 10 mg by  mouth at bedtime.    [provider]  pantoprazole (PROTONIX) 20 MG tablet Take 1 tablet (20 mg total) by mouth daily. 04/21/23   Gwyneth Sprout, MD  rosuvastatin (CRESTOR) 20 MG tablet TAKE 1 TABLET BY MOUTH ONCE DAILY .  PLEASE  KEEP  UPCOMING  APPOINTMENT  ON  03/30/23  IN  ORDER  TO  RECEIVE  FUTURE  REFILLS 04/02/23   Marykay Lex, MD  sucralfate (CARAFATE) 1 g tablet Take 1 tablet (1 g total) by mouth 4 (four) times daily -  with meals and at bedtime. 04/21/23   Gwyneth Sprout, MD  sulfamethoxazole-trimethoprim (BACTRIM DS) 800-160 MG tablet Take 1 tablet by mouth 2 (two) times daily. For urinary tract infection. 12/04/22   Doreene Nest, NP  tamsulosin  (FLOMAX) 0.4 MG CAPS capsule Take 1 capsule (0.4 mg total) by mouth daily. As needed for kidney stone 11/24/22   Dana Allan, MD  traMADol (ULTRAM) 50 MG tablet Take 50 mg by mouth every 4 (four) hours as needed. 09/05/21   [provider]  traMADol (ULTRAM) 50 MG tablet Take 1-2 tablets (50-100 mg total) by mouth every 6 (six) hours as needed (pain). 01/27/23   Heloise Purpura, MD      Allergies    Latex and Penicillins    Review of Systems   Review of Systems  Physical Exam Updated Vital Signs BP 123/73   Pulse 70   Temp 98.2 F (36.8 C)   Resp (!) 24   Ht 5\' 2"  (1.575 m)   Wt 84.4 kg   LMP 08/12/2015 Comment: patient states hx. tubal ligation  SpO2 100%   BMI 34.02 kg/m  Physical Exam  ED Results / Procedures / Treatments   Labs (all labs ordered are listed, but only abnormal results are displayed) Labs Reviewed - No data to display  EKG None  Radiology CT Head Wo Contrast Result Date: 05/26/2023 CLINICAL DATA:  Polytrauma, blunt. Fall backwards while pulling a box from a shelf, hitting the head on a concrete floor. Uncertain loss of consciousness. EXAM: CT HEAD WITHOUT CONTRAST CT CERVICAL SPINE WITHOUT CONTRAST TECHNIQUE: Multidetector CT imaging of the head and cervical spine was performed following the standard protocol without intravenous contrast. Multiplanar CT image reconstructions of the cervical spine were also generated. RADIATION DOSE REDUCTION: This exam was performed according to the departmental dose-optimization program which includes automated exposure control, adjustment of the mA and/or kV according to patient size and/or use of iterative reconstruction technique. COMPARISON:  Head CT 07/20/2021 FINDINGS: CT HEAD FINDINGS Brain: There is no evidence of an acute infarct, intracranial hemorrhage, mass, midline shift, or extra-axial fluid collection. Cerebral volume is normal. The ventricles are normal in size. Low lying cerebellar tonsils are similar to  the prior study, with the tonsils extending approximately 9 mm below the foramen magnum which has a crowded appearance. Vascular: No hyperdense vessel. Skull: No acute fracture or suspicious lesion. Sinuses/Orbits: Paranasal sinuses and mastoid air cells are clear. Unremarkable orbits. Other: Mild posterior scalp soft tissue swelling. CT CERVICAL SPINE FINDINGS Alignment: Cervical spine straightening.  No listhesis. Skull base and vertebrae: No acute fracture or suspicious lesion. Soft tissues and spinal canal: No prevertebral fluid or swelling. No visible canal hematoma. Disc levels: Mild cervical spondylosis. Central disc protrusion at C4-5 resulting in mild spinal stenosis. Upper chest: No mass or consolidation in the included lung apices. Other: None. IMPRESSION: 1. No evidence of acute intracranial abnormality. 2. No acute cervical spine fracture  or subluxation. 3. Chiari I malformation. Electronically Signed   By: Sebastian Ache M.D.   On: 05/26/2023 10:46   CT Cervical Spine Wo Contrast Result Date: 05/26/2023 CLINICAL DATA:  Polytrauma, blunt. Fall backwards while pulling a box from a shelf, hitting the head on a concrete floor. Uncertain loss of consciousness. EXAM: CT HEAD WITHOUT CONTRAST CT CERVICAL SPINE WITHOUT CONTRAST TECHNIQUE: Multidetector CT imaging of the head and cervical spine was performed following the standard protocol without intravenous contrast. Multiplanar CT image reconstructions of the cervical spine were also generated. RADIATION DOSE REDUCTION: This exam was performed according to the departmental dose-optimization program which includes automated exposure control, adjustment of the mA and/or kV according to patient size and/or use of iterative reconstruction technique. COMPARISON:  Head CT 07/20/2021 FINDINGS: CT HEAD FINDINGS Brain: There is no evidence of an acute infarct, intracranial hemorrhage, mass, midline shift, or extra-axial fluid collection. Cerebral volume is normal.  The ventricles are normal in size. Low lying cerebellar tonsils are similar to the prior study, with the tonsils extending approximately 9 mm below the foramen magnum which has a crowded appearance. Vascular: No hyperdense vessel. Skull: No acute fracture or suspicious lesion. Sinuses/Orbits: Paranasal sinuses and mastoid air cells are clear. Unremarkable orbits. Other: Mild posterior scalp soft tissue swelling. CT CERVICAL SPINE FINDINGS Alignment: Cervical spine straightening.  No listhesis. Skull base and vertebrae: No acute fracture or suspicious lesion. Soft tissues and spinal canal: No prevertebral fluid or swelling. No visible canal hematoma. Disc levels: Mild cervical spondylosis. Central disc protrusion at C4-5 resulting in mild spinal stenosis. Upper chest: No mass or consolidation in the included lung apices. Other: None. IMPRESSION: 1. No evidence of acute intracranial abnormality. 2. No acute cervical spine fracture or subluxation. 3. Chiari I malformation. Electronically Signed   By: Sebastian Ache M.D.   On: 05/26/2023 10:46    Procedures Procedures    Medications Ordered in ED Medications  ketorolac (TORADOL) 15 MG/ML injection 15 mg (15 mg Intravenous Given 05/26/23 2130)    ED Course/ Medical Decision Making/ A&P                                 Medical Decision Making Patient, previously well presents after mechanical fall, with questionable loss of consciousness, posterior hematoma.  Patient awake, alert, hemodynamically unremarkable, initially neurologically unremarkable as well, but with neck pain, head pain, concern for fracture versus intracranial abnormality versus soft tissue injury.   Amount and/or Complexity of Data Reviewed Independent Historian: EMS    Details: At bedside report Radiology: ordered and independent interpretation performed. Decision-making details documented in ED Course.  Risk Prescription drug management.   11:31 AM Patient weight, alert, in no  distress, hemodynamically unremarkable, head CT, neck CT both reviewed, no acute bleed, no fracture.  Patient remains neurologically unremarkable.  There is incidental notation of Chiari I. Patient discharged in stable condition.        Final Clinical Impression(s) / ED Diagnoses Final diagnoses:  Fall, initial encounter  Injury of head, initial encounter    Rx / DC Orders ED Discharge Orders     None         Gerhard Munch, MD 05/26/23 1131

## 2023-05-26 NOTE — ED Triage Notes (Signed)
 Pt BIB by EMS s/p fall while pulling a box from a shelf. Pt reports falling backwards and hitting head on concrete floor. Unsure of LOC. No active bleeding noted. Alert and oriented on arrival, able to ambulate.  EMS VS: BP 112/67 HR 70 98% RA  CBG 117

## 2023-05-31 ENCOUNTER — Telehealth: Payer: Self-pay

## 2023-05-31 NOTE — Transitions of Care (Post Inpatient/ED Visit) (Unsigned)
 I spoke with pt; Pt was seen at [facility] ED oin 05/26/23 after a fall at work; pt was trying to get a box off shelf and pt fell back wards and hit head; ? if lost consciousness. H/A on and off and back of neck hurts.pt is sore all over. Pt has no complaint of dizziness. I reviewed pts med list but pt was not given med to take home at ED visit. Pt already scheduled for CPX on 06/01/23 at 2::20 with Allayne Gitelman NP and pt wants to do HFU at same time. UC & ED precautions given and pt voiced understanding.Sending note to Allayne Gitelman NP.        05/31/2023  Name: Abigail [name] MRN: [id number] DOB: 1965/11/03  Today's TOC FU Call Status: Today's TOC FU Call Status:: Successful TOC FU Call Completed TOC FU Call Complete Date: 05/31/23 Patient's Name and Date of Birth confirmed.  Transition Care Management Follow-up Telephone Call Date of Discharge: 05/26/23 Discharge Facility: [facility]) Type of Discharge: Emergency Department Reason for ED Visit: Other: (Pt was seen at [facility] ED oin 05/26/23 after a fall at work; pt was trying to get a box off shelf and pt fell back wards and hit head; ? if lost consciousness. H/A on and off and back ofneck hurts.pt is sore all over.l) How have you been since you were released from the hospital?: Better Any questions or concerns?: No  Items Reviewed: Did you receive and understand the discharge instructions provided?: Yes Medications obtained,verified, and reconciled?: Yes (Medications Reviewed) Any new allergies since your discharge?: No Dietary orders reviewed?: NA Do you have support at home?: No  Medications Reviewed Today: Medications Reviewed Today     Reviewed by Patience Musca, LPN (Licensed Practical Nurse) on 05/31/23 at 1540  Med List Status: <None>   Medication Order Taking? Sig Documenting Provider Last Dose Status Informant  Accu-Chek FastClix Lancets MISC [id number] No USE 1 LANCET TO CHECK BLOOD SUGAR THREE TIMES DAILY Doreene Nest, NP  Taking Active Self  ADVIL 200 MG tablet [id number] No Take 200-600 mg by mouth every 6 (six) hours as needed for mild pain or headache. [provider] Taking Active Self  albuterol (PROAIR HFA) 108 (90 Base) MCG/ACT inhaler [id number] No Inhale 2 puffs into the lungs every 6 (six) hours as needed for wheezing or shortness of breath. Doreene Nest, NP Taking Active   Aspirin-Caffeine [place] [facility] BACK & BODY PO) [id number] No Take 1 tablet by mouth daily as needed (body pain). [provider] Taking Active Self  benzonatate (TESSALON) 200 MG capsule [id number] No Take 1 capsule (200 mg total) by mouth 3 (three) times daily as needed for cough. Doreene Nest, NP Taking Active   Blood Glucose Monitoring Suppl (ACCU-CHEK GUIDE) w/Device KIT [id number] No by Does not apply route. [provider] Taking Active Self  chlorpheniramine-HYDROcodone (TUSSIONEX) 10-8 MG/5ML [id number] No Take 5 mLs by mouth every 12 (twelve) hours as needed. Excell Seltzer, MD Taking Active   Cholecalciferol (VITAMIN D3) 1.25 MG (50000 UT) CAPS [id number]  Take 1 capsule by mouth once a week Doreene Nest, NP  Active   clotrimazole (LOTRIMIN) 1 % cream [id number] No Apply to affected area 2 times daily Rolan Bucco, MD Taking Active   FLUoxetine (PROZAC) 40 MG capsule [id number] No TAKE 1 CAPSULE BY MOUTH ONCE DAILY FOR ANXIETY Doreene Nest, NP Taking Active   HYDROcodone-acetaminophen (NORCO/VICODIN) 5-325 MG tablet [id number] No Take 1 tablet by  mouth every 6 (six) hours as needed. [provider] Taking Active   hyoscyamine (LEVSIN SL) 0.125 MG SL tablet [id number]  Place 1 tablet (0.125 mg total) under the tongue 2 (two) times daily as needed (spasms). Napoleon Form, MD  Active   Lancets Misc. (ACCU-CHEK FASTCLIX LANCET) KIT [id number] No Use as directed to test blood sugar daily Doreene Nest, NP Taking Active Self  levothyroxine (SYNTHROID) 75 MCG tablet [id number] No TAKE 1  TABLET BY MOUTH IN THE MORNING ON  AN  EMPTY  STOMACH  WITH  WATER  ONLY  NO  FOOD  OR  OTHER  MEDICATIONS  FOR  30  MINUTES Doreene Nest, NP Taking Active   LINZESS 145 MCG CAPS capsule [id number]  Take 1 capsule by mouth at bedtime Nandigam, Eleonore Chiquito, MD  Active   metoprolol tartrate (LOPRESSOR) 25 MG tablet [id number]  Take 1 tablet (25 mg total) by mouth 2 (two) times daily. Marykay Lex, MD  Active   ondansetron (ZOFRAN-ODT) 4 MG disintegrating tablet [id number] No Take 1 tablet (4 mg total) by mouth every 8 (eight) hours as needed for nausea or vomiting. Dana Allan, MD Taking Active   oxybutynin (DITROPAN-XL) 10 MG 24 hr tablet [id number] No Take 10 mg by mouth at bedtime. [provider] Taking Active   pantoprazole (PROTONIX) 20 MG tablet [id number]  Take 1 tablet (20 mg total) by mouth daily. Gwyneth Sprout, MD  Active   rosuvastatin (CRESTOR) 20 MG tablet [id number]  TAKE 1 TABLET BY MOUTH ONCE DAILY .  PLEASE  KEEP  UPCOMING  APPOINTMENT  ON  03/30/23  IN  ORDER  TO  RECEIVE  FUTURE  REFILLS Marykay Lex, MD  Active   sucralfate (CARAFATE) 1 g tablet [id number]  Take 1 tablet (1 g total) by mouth 4 (four) times daily -  with meals and at bedtime. Gwyneth Sprout, MD  Active   sulfamethoxazole-trimethoprim (BACTRIM DS) 800-160 MG tablet [id number] No Take 1 tablet by mouth 2 (two) times daily. For urinary tract infection. Doreene Nest, NP Taking Active   tamsulosin [facility]) 0.4 MG CAPS capsule [id number] No Take 1 capsule (0.4 mg total) by mouth daily. As needed for kidney stone Dana Allan, MD Taking Active   traMADol (ULTRAM) 50 MG tablet [id number] No Take 50 mg by mouth every 4 (four) hours as needed. [provider] Taking Active   traMADol (ULTRAM) 50 MG tablet [id number] No Take 1-2 tablets (50-100 mg total) by mouth every 6 (six) hours as needed (pain). Heloise Purpura, MD Taking Active             Home Care and Equipment/Supplies: Were Home  Health Services Ordered?: NA Any new equipment or medical supplies ordered?: NA  Functional Questionnaire: Do you need assistance with bathing/showering or dressing?: No Do you need assistance with meal preparation?: No Do you need assistance with eating?: No Do you have difficulty maintaining continence: No Do you need assistance with getting out of bed/getting out of a chair/moving?: No Do you have difficulty managing or taking your medications?: No  Follow up appointments reviewed: PCP Follow-up appointment confirmed?: Yes Date of PCP follow-up appointment?: 06/01/23 Follow-up Provider: Allayne Gitelman NP [facility] Follow-up appointment confirmed?: NA Do you need transportation to your follow-up appointment?: No Do you understand care options if your condition(s) worsen?: Yes-patient verbalized understanding    SIGNATURE Lewanda Rife, LPN

## 2023-06-01 ENCOUNTER — Encounter: Payer: 59 | Admitting: Primary Care

## 2023-06-01 ENCOUNTER — Other Ambulatory Visit (HOSPITAL_COMMUNITY)
Admission: RE | Admit: 2023-06-01 | Discharge: 2023-06-01 | Disposition: A | Discharge status: Home / Self Care | Source: Ambulatory Visit | Attending: Primary Care | Admitting: Primary Care

## 2023-06-01 ENCOUNTER — Ambulatory Visit: Admitting: Primary Care

## 2023-06-01 ENCOUNTER — Encounter: Payer: Self-pay | Admitting: Primary Care

## 2023-06-01 VITALS — BP 102/60 | HR 73 | Temp 97.9°F | Ht 62.0 in | Wt 191.0 lb

## 2023-06-01 DIAGNOSIS — K5909 Other constipation: Secondary | ICD-10-CM

## 2023-06-01 DIAGNOSIS — M542 Cervicalgia: Secondary | ICD-10-CM

## 2023-06-01 DIAGNOSIS — E1165 Type 2 diabetes mellitus with hyperglycemia: Secondary | ICD-10-CM

## 2023-06-01 DIAGNOSIS — K219 Gastro-esophageal reflux disease without esophagitis: Secondary | ICD-10-CM

## 2023-06-01 DIAGNOSIS — Z0001 Encounter for general adult medical examination with abnormal findings: Secondary | ICD-10-CM

## 2023-06-01 DIAGNOSIS — Z124 Encounter for screening for malignant neoplasm of cervix: Secondary | ICD-10-CM

## 2023-06-01 DIAGNOSIS — E785 Hyperlipidemia, unspecified: Secondary | ICD-10-CM | POA: Diagnosis not present

## 2023-06-01 DIAGNOSIS — F419 Anxiety disorder, unspecified: Secondary | ICD-10-CM

## 2023-06-01 DIAGNOSIS — Z23 Encounter for immunization: Secondary | ICD-10-CM

## 2023-06-01 DIAGNOSIS — R918 Other nonspecific abnormal finding of lung field: Secondary | ICD-10-CM

## 2023-06-01 DIAGNOSIS — G8929 Other chronic pain: Secondary | ICD-10-CM

## 2023-06-01 DIAGNOSIS — R229 Localized swelling, mass and lump, unspecified: Secondary | ICD-10-CM | POA: Insufficient documentation

## 2023-06-01 DIAGNOSIS — E1169 Type 2 diabetes mellitus with other specified complication: Secondary | ICD-10-CM

## 2023-06-01 DIAGNOSIS — E039 Hypothyroidism, unspecified: Secondary | ICD-10-CM

## 2023-06-01 DIAGNOSIS — N200 Calculus of kidney: Secondary | ICD-10-CM

## 2023-06-01 DIAGNOSIS — F32A Depression, unspecified: Secondary | ICD-10-CM

## 2023-06-01 DIAGNOSIS — M545 Low back pain, unspecified: Secondary | ICD-10-CM

## 2023-06-01 DIAGNOSIS — N3281 Overactive bladder: Secondary | ICD-10-CM

## 2023-06-01 DIAGNOSIS — I2510 Atherosclerotic heart disease of native coronary artery without angina pectoris: Secondary | ICD-10-CM

## 2023-06-01 MED ORDER — FLUOXETINE HCL 10 MG PO CAPS: 10.0000 mg | ORAL_CAPSULE | Freq: Every day | ORAL | 3 refills | Status: DC

## 2023-06-01 NOTE — Assessment & Plan Note (Signed)
Controlled.  Continue Linzess 145 mcg daily.

## 2023-06-01 NOTE — Progress Notes (Signed)
 Subjective:    Patient ID: Abigail [name], female    DOB: 01-Nov-1965, 58 y.o.   MRN: [id number]  HPI  Abigail [name] is a very pleasant 58 y.o. female with a history of hypertension, PVCs, chronic constipation, celiac disease, hyperlipidemia, type 2 diabetes, overactive bladder, renal stones, anxiety depression, chronic back pain, hypothyroidism who presents today for ED follow-up, complete physical, and follow up of chronic conditions.  She presented to [facility] ED via EMS on 05/26/2023 for head injury after a fall.  She was at work, reaching for something on a rock, fell backwards striking the back of her head on concrete floor.  She was ambulatory, unsure if consciousness was lost.  During her stay in the ED she underwent CT head and C-spine which was negative for acute findings.  Her exam was hemodynamically stable so she was discharged home later that day.  Since her ED visit she's continued to notice neck pain. She's also noticed muscle aches. She does have headaches but she's not sure if this if from allergies or her general headaches.   She would also like to discuss skin swelling. Chronic mass to the left lateral neck between neck and shoulder for years. Over the years she's noticed increase in size. She denies pain.  She would also like to increase her fluoxetine dose to 50 mg. She was once managed on an extra 10 mg dose which helped.   Immunizations: -Tetanus: Completed in 2023 -Shingles: Never completed Shingrix series, declines -Pneumonia: Never completed, declines   Diet: Fair diet. Recently resumed Optivia.  Exercise: No regular exercise.  Eye exam: Completed > 1 year ago Dental exam: Completes semi-annually    Pap Smear: Completed in March 2022, due today.  Mammogram: Completed in 2023, declines today but will get this done through her employer in October 2025.   Colonoscopy: Completed in 2024, due 2027   Wt Readings from Last 3 Encounters:  06/01/23 191 lb  (86.6 kg)  05/26/23 186 lb (84.4 kg)  04/23/23 189 lb 9 oz (86 kg)       Review of Systems  Constitutional:  Negative for unexpected weight change.  HENT:  Negative for rhinorrhea.   Eyes:  Negative for visual disturbance.  Respiratory:  Negative for cough and shortness of breath.   Cardiovascular:  Negative for chest pain.  Gastrointestinal:  Negative for constipation and diarrhea.  Genitourinary:  Negative for difficulty urinating.  Musculoskeletal:  Positive for arthralgias and myalgias.  Skin:  Negative for rash.  Allergic/Immunologic: Negative for environmental allergies.  Neurological:  Positive for headaches. Negative for dizziness.  Psychiatric/Behavioral:  The patient is nervous/anxious.          Past Medical History:  Diagnosis Date   Anginal pain (HCC)    likely due to pleurisy   Anxiety    Biliary dyskinesia    Bulging lumbar disc    CAP (community acquired pneumonia)    Cardiac arrhythmia due to congenital heart disease    no arrhythmia identified    Congenital heart disease    Depression    Fatty liver 05/2016   Frequent headaches    GERD (gastroesophageal reflux disease)    Heart disease    Hypertension    Hypokalemia 07/19/2021   Hypomagnesemia 07/19/2021   Hypothyroidism    Kidney stones    Migraine    Obesity (BMI 35.0-39.9 without comorbidity)    PVC's (premature ventricular contractions)    Right upper quadrant abdominal pain 11/29/2018  Sigmoid diverticulosis    T2DM (type 2 diabetes mellitus) (HCC)     Social History   Socioeconomic History   Marital status: Single    Spouse name: Not on file   Number of children: Not on file   Years of education: Not on file   Highest education level: Not on file  Occupational History   Not on file  Tobacco Use   Smoking status: Former    Current packs/day: 0.00    Types: Cigarettes    Quit date: 2008    Years since quitting: 17.2    Passive exposure: Past   Smokeless tobacco: Never    Tobacco comments:    used tobacco "socially"  Vaping Use   Vaping status: Never Used  Substance and Sexual Activity   Alcohol use: No    Alcohol/week: 0.0 standard drinks of alcohol   Drug use: No   Sexual activity: Not on file  Other Topics Concern   Not on file  Social History Narrative   Single.   Has a set of Twins, age 68.   Works for the [employer]    Enjoys relaxing, spending time with her children.   Social Drivers of Corporate investment banker Strain: Not on file  Food Insecurity: Not on file  Transportation Needs: Not on file  Physical Activity: Not on file  Stress: Not on file  Social Connections: Unknown (07/21/2021)   Received from [facility]   Social Network    Social Network: Not on file  Intimate Partner Violence: Unknown (06/12/2021)   Received from [facility]   HITS    Physically Hurt: Not on file    Insult or Talk Down To: Not on file    Threaten Physical Harm: Not on file    Scream or Curse: Not on file    Past Surgical History:  Procedure Laterality Date   ABDOMINAL EXPLORATION SURGERY  1986   COLONOSCOPY  06/2016   CYSTOSCOPY WITH RETROGRADE PYELOGRAM, URETEROSCOPY AND STENT PLACEMENT Left 01/27/2023   Procedure: CYSTOSCOPY, LEFT URETEROSCOPY and stone extraction;  Surgeon: Heloise Purpura, MD;  Location: [facility] OR[facility];  Service: Urology;  Laterality: Left;   CYSTOSCOPY/URETEROSCOPY/HOLMIUM LASER/STENT PLACEMENT Right 05/16/2021   Procedure: CYSTOSCOPY/URETEROSCOPY/HOLMIUM LASER/STENT PLACEMENT;  Surgeon: Jannifer Hick, MD;  Location: [facility];  Service: Urology;  Laterality: Right;   ESOPHAGEAL MANOMETRY N/A 12/06/2019   Procedure: ESOPHAGEAL MANOMETRY (EM);  Surgeon: Napoleon Form, MD;  Location: [facility] END[facility];  Service: Endoscopy;  Laterality: N/A;   EXTRACORPOREAL SHOCK WAVE LITHOTRIPSY Left 12/03/2016   Procedure: LEFT EXTRACORPOREAL SHOCK WAVE LITHOTRIPSY (ESWL);  Surgeon: Bjorn Pippin, MD;  Location: [facility]  [facility];  Service: Urology;  Laterality: Left;   KIDNEY STONE SURGERY     LEFT HEART CATH AND CORONARY ANGIOGRAPHY  07/28/2016    Large tortuous coronary arteries no radiographic evidence of disease.  Not anginal chest pain.  Normal EF.   LITHOTRIPSY     NASAL SINUS SURGERY     ROTATOR CUFF REPAIR Right 12/27/2017   TRANSTHORACIC ECHOCARDIOGRAM  06/2017   EF 60-65%.  GR 1-2 DD.  Otherwise normal echo.  Normal valve function.  Normal wall motion.   TUBAL LIGATION      Family History  Problem Relation Age of Onset   Gallstones Mother    COPD Father    Emphysema Father    Colon cancer Neg Hx    Stomach cancer Neg Hx    Esophageal cancer Neg  Hx    Pancreatic cancer Neg Hx    Liver disease Neg Hx    Colon polyps Neg Hx    Rectal cancer Neg Hx     Allergies  Allergen Reactions   Latex Rash   Penicillins Nausea Only    Nausea and vomiting     Current Outpatient Medications on File Prior to Visit  Medication Sig Dispense Refill   Accu-Chek FastClix Lancets MISC USE 1 LANCET TO CHECK BLOOD SUGAR THREE TIMES DAILY 300 each 1   ADVIL 200 MG tablet Take 200-600 mg by mouth every 6 (six) hours as needed for mild pain or headache.     albuterol (PROAIR HFA) 108 (90 Base) MCG/ACT inhaler Inhale 2 puffs into the lungs every 6 (six) hours as needed for wheezing or shortness of breath. 8 g 0   Aspirin-Caffeine (BAYER BACK & BODY PO) Take 1 tablet by mouth daily as needed (body pain).     Blood Glucose Monitoring Suppl (ACCU-CHEK GUIDE) w/Device KIT by Does not apply route.     Cholecalciferol (VITAMIN D3) 1.25 MG (50000 UT) CAPS Take 1 capsule by mouth once a week 12 capsule 0   clotrimazole (LOTRIMIN) 1 % cream Apply to affected area 2 times daily 15 g 0   FLUoxetine (PROZAC) 40 MG capsule TAKE 1 CAPSULE BY MOUTH ONCE DAILY FOR ANXIETY 90 capsule 0   HYDROcodone-acetaminophen (NORCO/VICODIN) 5-325 MG tablet Take 1 tablet by mouth every 6 (six) hours as needed.     hyoscyamine (LEVSIN SL)  0.125 MG SL tablet Place 1 tablet (0.125 mg total) under the tongue 2 (two) times daily as needed (spasms). 60 tablet 0   Lancets Misc. (ACCU-CHEK FASTCLIX LANCET) KIT Use as directed to test blood sugar daily 1 kit 0   levothyroxine (SYNTHROID) 75 MCG tablet TAKE 1 TABLET BY MOUTH IN THE MORNING ON  AN  EMPTY  STOMACH  WITH  WATER  ONLY  NO  FOOD  OR  OTHER  MEDICATIONS  FOR  30  MINUTES 90 tablet 3   LINZESS 145 MCG CAPS capsule Take 1 capsule by mouth at bedtime 30 capsule 0   metoprolol tartrate (LOPRESSOR) 25 MG tablet Take 1 tablet (25 mg total) by mouth 2 (two) times daily. 180 tablet 3   ondansetron (ZOFRAN-ODT) 4 MG disintegrating tablet Take 1 tablet (4 mg total) by mouth every 8 (eight) hours as needed for nausea or vomiting. 15 tablet 0   oxybutynin (DITROPAN-XL) 10 MG 24 hr tablet Take 10 mg by mouth at bedtime.     pantoprazole (PROTONIX) 20 MG tablet Take 1 tablet (20 mg total) by mouth daily. 30 tablet 2   rosuvastatin (CRESTOR) 20 MG tablet TAKE 1 TABLET BY MOUTH ONCE DAILY .  PLEASE  KEEP  UPCOMING  APPOINTMENT  ON  03/30/23  IN  ORDER  TO  RECEIVE  FUTURE  REFILLS 90 tablet 3   tamsulosin (FLOMAX) 0.4 MG CAPS capsule Take 1 capsule (0.4 mg total) by mouth daily. As needed for kidney stone 30 capsule 0   traMADol (ULTRAM) 50 MG tablet Take 1-2 tablets (50-100 mg total) by mouth every 6 (six) hours as needed (pain). 8 tablet 0   No current facility-administered medications on file prior to visit.    BP 102/60   Pulse 73   Temp 97.9 F (36.6 C) (Temporal)   Ht 5\' 2"  (1.575 m)   Wt 191 lb (86.6 kg)   LMP 08/12/2015 Comment: patient states hx.  tubal ligation  SpO2 96%   BMI 34.93 kg/m  Objective:   Physical Exam Exam conducted with a chaperone present.  HENT:     Right Ear: Tympanic membrane and ear canal normal.     Left Ear: Tympanic membrane and ear canal normal.  Eyes:     Pupils: Pupils are equal, round, and reactive to light.  Cardiovascular:     Rate and Rhythm:  Normal rate and regular rhythm.  Pulmonary:     Effort: Pulmonary effort is normal.     Breath sounds: Normal breath sounds.  Abdominal:     General: Bowel sounds are normal.     Palpations: Abdomen is soft.     Tenderness: There is no abdominal tenderness.  Genitourinary:    Labia:        Right: No rash, tenderness or lesion.        Left: No rash, tenderness or lesion.      Vagina: Normal.     Cervix: Normal.     Uterus: Normal.      Adnexa: Right adnexa normal and left adnexa normal.  Musculoskeletal:     Cervical back: Neck supple. Pain with movement present. Decreased range of motion.  Skin:    General: Skin is warm and dry.     Comments: Swelling to left lateral neck in between neck and shoulder. Non tender. Soft.   Neurological:     Mental Status: She is alert and oriented to person, place, and time.     Cranial Nerves: No cranial nerve deficit.     Deep Tendon Reflexes:     Reflex Scores:      Patellar reflexes are 2+ on the right side and 2+ on the left side. Psychiatric:        Mood and Affect: Mood normal.           Assessment & Plan:  Encounter for annual general medical examination with abnormal findings in adult Assessment & Plan: Prevnar 20 provided today.  Discussed Shingrix vaccines, will delay until next visit. Pap smear due, completed today. Mammogram due, she will have this done through her employer in October Colonoscopy UTD, due 2029  Discussed the importance of a healthy diet and regular exercise in order for weight loss, and to reduce the risk of further co-morbidity.  Exam stable. Labs pending.  Follow up in 1 year for repeat physical.    Mild CAD Assessment & Plan: Reviewed coronary artery CT scan from 2025.  Strongly advised she resume rosuvastatin 20 mg daily.  Repeat lipid pending.   Pulmonary nodules Assessment & Plan: Reviewed CT chest from January 2025.  Benign.    Chronic constipation Assessment &  Plan: Controlled.  Continue Linzess 145 mcg daily.    Gastroesophageal reflux disease, unspecified whether esophagitis present Assessment & Plan: Deteriorated.  Following with GI.  Continue pantoprazole 20 mg daily (new Rx) and Levsin 0.125 mg PRN.   Controlled type 2 diabetes mellitus with hyperglycemia, without long-term current use of insulin (HCC) Assessment & Plan: Repeat A1C pending. Continue off medication for now. Await results.   Discussed GLP1 agonist treatment. She declines today.   Follow up in 6 months.   Orders: -     Microalbumin / creatinine urine ratio -     Hemoglobin A1c  Hyperlipidemia associated with type 2 diabetes mellitus (HCC) Assessment & Plan: Repeat lipid panel pending. Strongly advised she resume rosuvastatin, discussed risks without statin therapy, especially in the setting of mild CAD  and diabetes.     Orders: -     Lipid panel -     Comprehensive metabolic panel  Hypothyroidism, unspecified type Assessment & Plan: She is taking levothyroxine at different times, between 10 pm and 4 am.  Discussed correct instructions for taking.  Continue levothyroxine 75 mcg daily. Repeat TSH pending.  Orders: -     TSH  Overactive bladder Assessment & Plan: Following with Urology. Improving.  Continue oxybutynin XL 20 mg daily.   Renal stones Assessment & Plan: Chronic.  Following with Urology Continue Flomax 0.4 mg PRN.   Anxiety and depression Assessment & Plan: Deteriorated.  Increase fluoxetine to 60 mg daily.  Continue fluoxetine 40 mg, add fluoxetine 10 mg.  She will update.   Orders: -     FLUoxetine HCl; Take 1 capsule (10 mg total) by mouth daily. for anxiety and depression. Take with 40 mg dose.  Dispense: 90 capsule; Refill: 3  Chronic low back pain, unspecified back pain laterality, unspecified whether sciatica present Assessment & Plan: Chronic, waxes and wanes. Following with spine specialist.  Continue  Norco 5-325 mg PRN, Tramadol 50 mg PRN, baclofen 5 mg PRN.   Localized skin mass, lump, or swelling Assessment & Plan: Differentials include fatty tissue, lipoma, cyst. Soft tissue ultrasound ordered and pending.  Orders: -     US SOFT TISSUE HEAD & NECK (NON-THYROID); Future  Screening for cervical cancer -     Cytology - PAP  Neck pain Assessment & Plan: Acute on chronic with recent ED visit. Emergency department notes and imaging reviewed.  Continue baclofen 5 to 10 mg as needed. No alarm signs on exam today.         Doreene Nest, NP

## 2023-06-01 NOTE — Assessment & Plan Note (Signed)
 Following with Urology. Improving.  Continue oxybutynin XL 20 mg daily.

## 2023-06-01 NOTE — Assessment & Plan Note (Signed)
 Acute on chronic with recent ED visit. Emergency department notes and imaging reviewed.  Continue baclofen 5 to 10 mg as needed. No alarm signs on exam today.

## 2023-06-01 NOTE — Assessment & Plan Note (Signed)
 Repeat A1C pending. Continue off medication for now. Await results.   Discussed GLP1 agonist treatment. She declines today.   Follow up in 6 months.

## 2023-06-01 NOTE — Assessment & Plan Note (Signed)
 Deteriorated.  Following with GI.  Continue pantoprazole 20 mg daily (new Rx) and Levsin 0.125 mg PRN.

## 2023-06-01 NOTE — Assessment & Plan Note (Signed)
 Chronic, waxes and wanes. Following with spine specialist.  Continue Norco 5-325 mg PRN, Tramadol 50 mg PRN, baclofen 5 mg PRN.

## 2023-06-01 NOTE — Assessment & Plan Note (Signed)
 Reviewed CT chest from January 2025.  Benign.

## 2023-06-01 NOTE — Patient Instructions (Signed)
 You will receive a phone call regarding the ultrasound of your neck.  Stop by the lab prior to leaving today. I will notify you of your results once received.   We increased the dose of your fluoxetine to 50 mg.  Take the 40 mg tablet and the 10 mg tablet.  Please schedule a follow up visit for 6 months for a diabetes check.  It was a pleasure to see you today!

## 2023-06-01 NOTE — Assessment & Plan Note (Signed)
 Deteriorated.  Increase fluoxetine to 60 mg daily.  Continue fluoxetine 40 mg, add fluoxetine 10 mg.  She will update.

## 2023-06-01 NOTE — Assessment & Plan Note (Signed)
 Chronic.  Following with Urology Continue Flomax 0.4 mg PRN.

## 2023-06-01 NOTE — Assessment & Plan Note (Addendum)
 Reviewed coronary artery CT scan from 2025.  Strongly advised she resume rosuvastatin 20 mg daily.  Repeat lipid pending.

## 2023-06-01 NOTE — Assessment & Plan Note (Signed)
 Prevnar 20 provided today.  Discussed Shingrix vaccines, will delay until next visit. Pap smear due, completed today. Mammogram due, she will have this done through her employer in October Colonoscopy UTD, due 2029  Discussed the importance of a healthy diet and regular exercise in order for weight loss, and to reduce the risk of further co-morbidity.  Exam stable. Labs pending.  Follow up in 1 year for repeat physical.

## 2023-06-01 NOTE — Telephone Encounter (Signed)
Noted and will evaluate.  

## 2023-06-01 NOTE — Assessment & Plan Note (Signed)
 She is taking levothyroxine at different times, between 10 pm and 4 am.  Discussed correct instructions for taking.  Continue levothyroxine 75 mcg daily. Repeat TSH pending.

## 2023-06-01 NOTE — Assessment & Plan Note (Signed)
 Differentials include fatty tissue, lipoma, cyst. Soft tissue ultrasound ordered and pending.

## 2023-06-01 NOTE — Assessment & Plan Note (Signed)
 Repeat lipid panel pending. Strongly advised she resume rosuvastatin, discussed risks without statin therapy, especially in the setting of mild CAD and diabetes.

## 2023-06-02 ENCOUNTER — Ambulatory Visit
Admission: RE | Admit: 2023-06-02 | Discharge: 2023-06-02 | Disposition: A | Discharge status: Home / Self Care | Source: Ambulatory Visit | Attending: Primary Care | Admitting: Primary Care

## 2023-06-02 DIAGNOSIS — R229 Localized swelling, mass and lump, unspecified: Secondary | ICD-10-CM

## 2023-06-02 LAB — LIPID PANEL
Cholesterol: 193 mg/dL (ref 0–200)
HDL: 32 mg/dL — ABNORMAL LOW (ref >39.00)
LDL Cholesterol: 109 mg/dL — ABNORMAL HIGH (ref 0–99)
NonHDL: 161.29
Total CHOL/HDL Ratio: 6
Triglycerides: 262 mg/dL — ABNORMAL HIGH (ref 0.0–149.0)
VLDL: 52.4 mg/dL — ABNORMAL HIGH (ref 0.0–40.0)

## 2023-06-02 LAB — COMPREHENSIVE METABOLIC PANEL WITH GFR
ALT: 29 U/L (ref 0–35)
AST: 21 U/L (ref 0–37)
Albumin: 4.1 g/dL (ref 3.5–5.2)
Alkaline Phosphatase: 109 U/L (ref 39–117)
BUN: 18 mg/dL (ref 6–23)
CO2: 29 meq/L (ref 19–32)
Calcium: 9 mg/dL (ref 8.4–10.5)
Chloride: 101 meq/L (ref 96–112)
Creatinine, Ser: 0.66 mg/dL (ref 0.40–1.20)
GFR: 96.71 mL/min (ref >60.00)
Glucose, Bld: 87 mg/dL (ref 70–99)
Potassium: 4.1 meq/L (ref 3.5–5.1)
Sodium: 138 meq/L (ref 135–145)
Total Bilirubin: 0.8 mg/dL (ref 0.2–1.2)
Total Protein: 6.4 g/dL (ref 6.0–8.3)

## 2023-06-02 LAB — HEMOGLOBIN A1C: Hgb A1c MFr Bld: 5.7 % (ref 4.6–6.5)

## 2023-06-02 LAB — MICROALBUMIN / CREATININE URINE RATIO
Creatinine,U: 63.9 mg/dL
Microalb Creat Ratio: 12.3 mg/g (ref 0.0–30.0)
Microalb, Ur: 0.8 mg/dL (ref 0.0–1.9)

## 2023-06-03 ENCOUNTER — Encounter: Admitting: Primary Care

## 2023-06-03 LAB — CYTOLOGY - PAP
Adequacy: ABSENT
Comment: NEGATIVE
Diagnosis: NEGATIVE
High risk HPV: NEGATIVE

## 2023-06-03 LAB — TSH: TSH: 2.26 u[IU]/mL (ref 0.35–5.50)

## 2023-06-08 ENCOUNTER — Other Ambulatory Visit: Payer: Self-pay | Admitting: Gastroenterology

## 2023-06-08 ENCOUNTER — Telehealth: Payer: Self-pay | Admitting: Gastroenterology

## 2023-06-08 NOTE — Telephone Encounter (Signed)
 Inbound call from patient stating she is having issues with her esophagus and has an appointment with Dr. Lavon Paganini tomorrow at 10:10. Patient is requesting a call back to discuss in the meantime. Please advise.

## 2023-06-09 ENCOUNTER — Ambulatory Visit: Payer: 59 | Admitting: Gastroenterology

## 2023-06-09 NOTE — Telephone Encounter (Signed)
 Patient returning call.

## 2023-06-09 NOTE — Telephone Encounter (Signed)
 Patient's appointment had to be moved to 06/14/23. Called the patient. No answer. Left a voicemail of my call.

## 2023-06-09 NOTE — Telephone Encounter (Signed)
 Patient called stating that she is returning a call, requesting a call back. Please advise.

## 2023-06-10 NOTE — Telephone Encounter (Signed)
 The patient tells me she has been using the Levsin SL with some results but not complete. She is monitoring what she eats, how much she eats and has still not been able to determine a trigger.  She will keep her appointment on 06/14/23.

## 2023-06-14 ENCOUNTER — Encounter: Payer: Self-pay | Admitting: Gastroenterology

## 2023-06-14 ENCOUNTER — Ambulatory Visit: Admitting: Gastroenterology

## 2023-06-14 VITALS — BP 110/64 | HR 65 | Ht 62.0 in | Wt 190.5 lb

## 2023-06-14 DIAGNOSIS — K219 Gastro-esophageal reflux disease without esophagitis: Secondary | ICD-10-CM | POA: Diagnosis not present

## 2023-06-14 DIAGNOSIS — K5904 Chronic idiopathic constipation: Secondary | ICD-10-CM

## 2023-06-14 DIAGNOSIS — R1319 Other dysphagia: Secondary | ICD-10-CM

## 2023-06-14 DIAGNOSIS — R1310 Dysphagia, unspecified: Secondary | ICD-10-CM

## 2023-06-14 DIAGNOSIS — K224 Dyskinesia of esophagus: Secondary | ICD-10-CM | POA: Diagnosis not present

## 2023-06-14 NOTE — Patient Instructions (Addendum)
 VISIT SUMMARY:  You came in today because you have been experiencing chest pain and difficulty swallowing solid foods since December. You described the pain as sometimes radiating to your neck and feeling like food gets stuck in your esophagus. You have been managing these symptoms with pantoprazole, Carafate, and a sublingual medication for spasms, but the relief has been inconsistent. You have also been trying to change your eating habits and have lost some weight. Your previous cardiac evaluations were unremarkable, and an esophageal spasm was suggested as a possible cause of your symptoms.  YOUR PLAN:  -ESOPHAGEAL SPASM: Esophageal spasm is a condition where the muscles of the esophagus contract abnormally, causing chest pain and difficulty swallowing. We will perform an upper endoscopy on Thursday to evaluate for esophageal spasm, peptic stricture, or esophagitis. If necessary, we will perform esophageal dilation during the endoscopy. In the meantime, please chew your food thoroughly, take small bites, and drink water with meals. Avoid large pills or cut them into smaller pieces, and elevate the head of your bed to reduce reflux. Avoid soft drinks, especially those with artificial sweeteners, and consider drinking seltzer water instead. Warm peppermint tea may help relax your esophagus.  -GASTROESOPHAGEAL REFLUX DISEASE (GERD): GERD is a chronic condition where stomach acid frequently flows back into the esophagus, causing irritation. This can lead to complications like peptic stricture or esophagitis. Continue taking pantoprazole and Carafate as prescribed. We will evaluate the need for stronger acid-reducing medication after your endoscopy. Avoid foods and drinks that worsen reflux, such as soft drinks and large meals.  INSTRUCTIONS:  Please come in for your upper endoscopy on Thursday. After the procedure, we will schedule a follow-up appointment to discuss the findings and adjust your treatment  plan accordingly.  You have been scheduled for an endoscopy. Please follow written instructions given to you at your visit today.  If you use inhalers (even only as needed), please bring them with you on the day of your procedure.  If you take any of the following medications, they will need to be adjusted prior to your procedure:   DO NOT TAKE 7 DAYS PRIOR TO TEST- Trulicity (dulaglutide) Ozempic, Wegovy (semaglutide) Mounjaro (tirzepatide) Bydureon Bcise (exanatide extended release)  DO NOT TAKE 1 DAY PRIOR TO YOUR TEST Rybelsus (semaglutide) Adlyxin (lixisenatide) Victoza (liraglutide) Byetta (exanatide) ___________________________________________________________________________    Due to recent changes in healthcare laws, you may see the results of your imaging and laboratory studies on MyChart before your provider has had a chance to review them.  We understand that in some cases there may be results that are confusing or concerning to you. Not all laboratory results come back in the same time frame and the provider may be waiting for multiple results in order to interpret others.  Please give [place] 48 hours in order for your provider to thoroughly review all the results before contacting the office for clarification of your results.    I appreciate the  opportunity to care for you  Thank You   Marsa Aris , MD

## 2023-06-14 NOTE — Progress Notes (Signed)
 Abigail [name]    [id number]    1966-02-06  Primary Care Physician:Clark, Keane Scrape, NP  Referring Physician: Doreene Nest, NP [address] [place],  [place] [address]   Chief complaint: Dysphagia  Discussed the use of AI scribe software for clinical note transcription with the patient, who gave verbal consent to proceed.  History of Present Illness The patient presents with chest pain and difficulty swallowing solid foods.  Since December, she has experienced chest pain and difficulty swallowing solid foods. The pain is located in the chest and sometimes radiates to the neck. She describes the sensation as if food is getting 'hung up' in her esophagus, particularly with solid foods like chicken nuggets and sausage balls. Shakes from her health plan, [employer], are consumed without issue, but solid foods often cause discomfort. Episodes of severe pain can 'take her breath away', and she sometimes regurgitates food in her sleep.  In January, a CT scan was ordered due to pulmonary nodules and some blockage. A cardiac CT with dye was performed, which showed some lines on the EKG, but cardiac causes were not considered concerning. She experienced chest pain at work, leading to an ER visit where esophageal spasm was suggested as a possible cause.  She is currently on pantoprazole, Carafate, and a sublingual medication for spasms. The sublingual medication helps sometimes, but not consistently. Her current medications also include fluoxetine, metoprolol, rosuvastatin, and oxybutynin, which she takes in the morning.  She has a history of gallbladder removal due to cholecystitis, which resolved without issues. She has been trying to change her eating habits, including chewing food thoroughly and taking smaller bites. She has been trying to lose weight and has reduced her weight from 192 to 184 pounds. She avoids soft drinks and prefers seltzer water. She occasionally  experiences discomfort after eating dense foods like [place] burgers and [employer] ham and cheese sandwiches. No issues with bowel habits, reporting regular daily movements. She does not frequently use ibuprofen or aspirin, with the last use being a couple of weeks ago. No current issues with her lungs, stating they are fine.   Colonoscopy 06/24/2022 -  - Three 4 to 8 mm polyps in the transverse colon and in the ascending colon, removed with a cold snare. Resected and retrieved.  - Non- bleeding external and internal hemorrhoids.  - Anal papilla( e) were hypertrophied.  Surgical Pathology 06/24/2022 - Surgical [P], colon, transverse and ascending, polyp (3) THREE FRAGMENTS OF TUBULAR ADENOMA. NEGATIVE FOR HIGH-GRADE DYSPLASIA.  Recent CBC and CMP wnl.   Outpatient Encounter Medications as of 06/14/2023  Medication Sig   Accu-Chek FastClix Lancets MISC USE 1 LANCET TO CHECK BLOOD SUGAR THREE TIMES DAILY   ADVIL 200 MG tablet Take 200-600 mg by mouth every 6 (six) hours as needed for mild pain or headache.   albuterol (PROAIR HFA) 108 (90 Base) MCG/ACT inhaler Inhale 2 puffs into the lungs every 6 (six) hours as needed for wheezing or shortness of breath.   Aspirin-Caffeine (BAYER BACK & BODY PO) Take 1 tablet by mouth daily as needed (body pain).   Blood Glucose Monitoring Suppl (ACCU-CHEK GUIDE) w/Device KIT by Does not apply route.   Cholecalciferol (VITAMIN D3) 1.25 MG (50000 UT) CAPS Take 1 capsule by mouth once a week   clotrimazole (LOTRIMIN) 1 % cream Apply to affected area 2 times daily   FLUoxetine (PROZAC) 10 MG capsule Take 1 capsule (10 mg  total) by mouth daily. for anxiety and depression. Take with 40 mg dose.   FLUoxetine (PROZAC) 40 MG capsule TAKE 1 CAPSULE BY MOUTH ONCE DAILY FOR ANXIETY   HYDROcodone-acetaminophen (NORCO/VICODIN) 5-325 MG tablet Take 1 tablet by mouth every 6 (six) hours as needed.   hyoscyamine (LEVSIN SL) 0.125 MG SL tablet Place 1 tablet (0.125 mg total) under  the tongue 2 (two) times daily as needed (spasms).   Lancets Misc. (ACCU-CHEK FASTCLIX LANCET) KIT Use as directed to test blood sugar daily   levothyroxine (SYNTHROID) 75 MCG tablet TAKE 1 TABLET BY MOUTH IN THE MORNING ON  AN  EMPTY  STOMACH  WITH  WATER  ONLY  NO  FOOD  OR  OTHER  MEDICATIONS  FOR  30  MINUTES   LINZESS 145 MCG CAPS capsule Take 1 capsule by mouth at bedtime   metoprolol tartrate (LOPRESSOR) 25 MG tablet Take 1 tablet (25 mg total) by mouth 2 (two) times daily.   ondansetron (ZOFRAN-ODT) 4 MG disintegrating tablet Take 1 tablet (4 mg total) by mouth every 8 (eight) hours as needed for nausea or vomiting.   oxybutynin (DITROPAN-XL) 10 MG 24 hr tablet Take 10 mg by mouth at bedtime.   pantoprazole (PROTONIX) 20 MG tablet Take 1 tablet (20 mg total) by mouth daily.   rosuvastatin (CRESTOR) 20 MG tablet TAKE 1 TABLET BY MOUTH ONCE DAILY .  PLEASE  KEEP  UPCOMING  APPOINTMENT  ON  03/30/23  IN  ORDER  TO  RECEIVE  FUTURE  REFILLS   tamsulosin (FLOMAX) 0.4 MG CAPS capsule Take 1 capsule (0.4 mg total) by mouth daily. As needed for kidney stone   traMADol (ULTRAM) 50 MG tablet Take 1-2 tablets (50-100 mg total) by mouth every 6 (six) hours as needed (pain).   No facility-administered encounter medications on file as of 06/14/2023.    Allergies as of 06/14/2023 - Review Complete 06/14/2023  Allergen Reaction Noted   Latex Rash 11/27/2016   Penicillins Nausea Only 12/29/2017    Past Medical History:  Diagnosis Date   Anginal pain (HCC)    likely due to pleurisy   Anxiety    Biliary dyskinesia    Bulging lumbar disc    CAP (community acquired pneumonia)    Cardiac arrhythmia due to congenital heart disease    no arrhythmia identified    Congenital heart disease    Depression    Fatty liver 05/2016   Frequent headaches    GERD (gastroesophageal reflux disease)    Heart disease    Hypertension    Hypokalemia 07/19/2021   Hypomagnesemia 07/19/2021   Hypothyroidism     Kidney stones    Migraine    Obesity (BMI 35.0-39.9 without comorbidity)    PVC's (premature ventricular contractions)    Right upper quadrant abdominal pain 11/29/2018   Sigmoid diverticulosis    T2DM (type 2 diabetes mellitus) (HCC)     Past Surgical History:  Procedure Laterality Date   ABDOMINAL EXPLORATION SURGERY  1986   COLONOSCOPY  06/2016   CYSTOSCOPY WITH RETROGRADE PYELOGRAM, URETEROSCOPY AND STENT PLACEMENT Left 01/27/2023   Procedure: CYSTOSCOPY, LEFT URETEROSCOPY and stone extraction;  Surgeon: Heloise Purpura, MD;  Location: [facility];  Service: Urology;  Laterality: Left;   CYSTOSCOPY/URETEROSCOPY/HOLMIUM LASER/STENT PLACEMENT Right 05/16/2021   Procedure: CYSTOSCOPY/URETEROSCOPY/HOLMIUM LASER/STENT PLACEMENT;  Surgeon: Jannifer Hick, MD;  Location: [facility];  Service: Urology;  Laterality: Right;   ESOPHAGEAL MANOMETRY N/A 12/06/2019   Procedure: ESOPHAGEAL MANOMETRY (EM);  Surgeon:  Napoleon Form, MD;  Location: [facility];  Service: Endoscopy;  Laterality: N/A;   EXTRACORPOREAL SHOCK WAVE LITHOTRIPSY Left 12/03/2016   Procedure: LEFT EXTRACORPOREAL SHOCK WAVE LITHOTRIPSY (ESWL);  Surgeon: Bjorn Pippin, MD;  Location: [facility];  Service: Urology;  Laterality: Left;   KIDNEY STONE SURGERY     LEFT HEART CATH AND CORONARY ANGIOGRAPHY  07/28/2016    Large tortuous coronary arteries no radiographic evidence of disease.  Not anginal chest pain.  Normal EF.   LITHOTRIPSY     NASAL SINUS SURGERY     ROTATOR CUFF REPAIR Right 12/27/2017   TRANSTHORACIC ECHOCARDIOGRAM  06/2017   EF 60-65%.  GR 1-2 DD.  Otherwise normal echo.  Normal valve function.  Normal wall motion.   TUBAL LIGATION      Family History  Problem Relation Age of Onset   Gallstones Mother    COPD Father    Emphysema Father    Colon cancer Neg Hx    Stomach cancer Neg Hx    Esophageal cancer Neg Hx    Pancreatic cancer Neg Hx    Liver disease Neg Hx    Colon polyps Neg Hx    Rectal cancer Neg Hx      Social History   Socioeconomic History   Marital status: Single    Spouse name: Not on file   Number of children: Not on file   Years of education: Not on file   Highest education level: Not on file  Occupational History   Not on file  Tobacco Use   Smoking status: Former    Current packs/day: 0.00    Types: Cigarettes    Quit date: 2008    Years since quitting: 17.2    Passive exposure: Past   Smokeless tobacco: Never   Tobacco comments:    used tobacco "socially"  Vaping Use   Vaping status: Never Used  Substance and Sexual Activity   Alcohol use: No    Alcohol/week: 0.0 standard drinks of alcohol   Drug use: No   Sexual activity: Not on file  Other Topics Concern   Not on file  Social History Narrative   Single.   Has a set of Twins, age 70.   Works for the [employer]    Enjoys relaxing, spending time with her children.   Social Drivers of Corporate investment banker Strain: Not on file  Food Insecurity: Not on file  Transportation Needs: Not on file  Physical Activity: Not on file  Stress: Not on file  Social Connections: Unknown (07/21/2021)   Received from [facility], [facility]   Social Network    Social Network: Not on file  Intimate Partner Violence: Unknown (06/12/2021)   Received from [facility], [facility]   HITS    Physically Hurt: Not on file    Insult or Talk Down To: Not on file    Threaten Physical Harm: Not on file    Scream or Curse: Not on file      Review of systems: All other review of systems negative except as mentioned in the HPI.   Physical Exam: Vitals:   06/14/23 1332  BP: 110/64  Pulse: 65   Body mass index is 34.84 kg/m. Gen:      No acute distress HEENT:  sclera anicteric CV: s1s2 rrr, no murmur Lungs: B/l clear. Abd:      soft, non-tender; no palpable masses, no distension Ext:    No edema Neuro:  alert and oriented x 3 Psych: normal mood and affect  Data Reviewed:  Reviewed labs,  radiology imaging, old records and pertinent past GI work up     Assessment and Plan Assessment & Plan Esophageal Spasm Intermittent chest pain and dysphagia since December, with episodes of food impaction and regurgitation, suggestive of esophageal spasm. Symptoms are exacerbated by solid foods and relieved by a liquid diet. Previous cardiac evaluations, including EKG and cardiac CT, were unremarkable, ruling out cardiac causes. Differential diagnosis includes esophageal spasm, peptic stricture, or esophagitis. She is currently on pantoprazole, Carafate, and sublingual medication for spasms, with partial relief. - Perform upper endoscopy on Thursday to evaluate for dysphagia, possible esophageal spasm, peptic stricture, or esophagitis. - Perform esophageal dilation during endoscopy if necessary. - Advise her to chew food thoroughly, take small bites, and drink water with meals. - Instruct her to avoid large pills or cut them into smaller pieces. - Recommend elevating the head of the bed to reduce reflux. - Advise against consuming soft drinks, especially those with artificial sweeteners, and suggest seltzer water as an alternative. - Encourage consumption of warm peppermint tea to relax the esophagus.  Gastroesophageal Reflux Disease (GERD) Chronic acid reflux with potential complications such as peptic stricture or esophagitis. Symptoms include regurgitation and chest pain, mimicking cardiac issues. Current management includes pantoprazole and Carafate, with plans to assess the need for stronger acid-reducing medication post-endoscopy. The endoscopy will help determine if there is esophageal narrowing due to long-term acid reflux, which could lead to peptic stricture or esophagitis. - Continue pantoprazole and Carafate. - Evaluate the need for stronger acid-reducing medication after endoscopy. - Advise her to avoid foods and drinks that exacerbate reflux, such as soft drinks and large  meals.  Follow-up Follow-up is necessary to evaluate the findings from the upper endoscopy and adjust treatment accordingly. - Schedule follow-up appointment after the upper endoscopy to discuss findings and treatment plan.   The patient was provided an opportunity to ask questions and all were answered. The patient agreed with the plan and demonstrated an understanding of the instructions.  Iona Beard , MD    CC: Doreene Nest, NP

## 2023-06-15 ENCOUNTER — Encounter: Payer: Self-pay | Admitting: Certified Registered Nurse Anesthetist

## 2023-06-17 ENCOUNTER — Ambulatory Visit: Admitting: Gastroenterology

## 2023-06-17 ENCOUNTER — Encounter: Payer: Self-pay | Admitting: Gastroenterology

## 2023-06-17 VITALS — BP 126/76 | HR 68 | Temp 97.5°F | Resp 16 | Ht 62.0 in | Wt 190.0 lb

## 2023-06-17 DIAGNOSIS — K2950 Unspecified chronic gastritis without bleeding: Secondary | ICD-10-CM | POA: Diagnosis not present

## 2023-06-17 DIAGNOSIS — K219 Gastro-esophageal reflux disease without esophagitis: Secondary | ICD-10-CM | POA: Diagnosis not present

## 2023-06-17 DIAGNOSIS — K2270 Barrett's esophagus without dysplasia: Secondary | ICD-10-CM

## 2023-06-17 DIAGNOSIS — K222 Esophageal obstruction: Secondary | ICD-10-CM

## 2023-06-17 DIAGNOSIS — R1310 Dysphagia, unspecified: Secondary | ICD-10-CM

## 2023-06-17 DIAGNOSIS — R1319 Other dysphagia: Secondary | ICD-10-CM

## 2023-06-17 MED ORDER — LINACLOTIDE 290 MCG PO CAPS: 290.0000 ug | ORAL_CAPSULE | Freq: Every day | ORAL | 3 refills | Status: DC

## 2023-06-17 MED ORDER — PANTOPRAZOLE SODIUM 40 MG PO TBEC: 40.0000 mg | DELAYED_RELEASE_TABLET | Freq: Two times a day (BID) | ORAL | 0 refills | Status: DC

## 2023-06-17 MED ORDER — HYOSCYAMINE SULFATE 0.125 MG SL SUBL: 0.1250 mg | SUBLINGUAL_TABLET | Freq: Two times a day (BID) | SUBLINGUAL | 0 refills | Status: DC | PRN

## 2023-06-17 MED ORDER — SODIUM CHLORIDE 0.9 % IV SOLN: 500.0000 mL | Freq: Once | INTRAVENOUS | Status: DC

## 2023-06-17 NOTE — Op Note (Signed)
 [facility] Patient Name: Abigail [name] Procedure Date: 06/17/2023 9:43 AM MRN: [id number] Endoscopist: Napoleon Form , MD, [id number] Age: 58 Referring MD:  Date of Birth: 1965-10-17 Gender: Female Account #: [id number] Procedure:                Upper GI endoscopy Indications:              Dysphagia, Esophageal reflux symptoms that persist                            despite appropriate therapy Medicines:                Monitored Anesthesia Care Procedure:                Pre-Anesthesia Assessment:                           - Prior to the procedure, a History and Physical                            was performed, and patient medications and                            allergies were reviewed. The patient's tolerance of                            previous anesthesia was also reviewed. The risks                            and benefits of the procedure and the sedation                            options and risks were discussed with the patient.                            All questions were answered, and informed consent                            was obtained. Prior Anticoagulants: The patient has                            taken no anticoagulant or antiplatelet agents. ASA                            Grade Assessment: II - A patient with mild systemic                            disease. After reviewing the risks and benefits,                            the patient was deemed in satisfactory condition to                            undergo the procedure.  After obtaining informed consent, the endoscope was                            passed under direct vision. Throughout the                            procedure, the patient's blood pressure, pulse, and                            oxygen saturations were monitored continuously. The                            Olympus Scope [id number] was introduced through the                            mouth, and  advanced to the second part of duodenum.                            The upper GI endoscopy was accomplished without                            difficulty. The patient tolerated the procedure                            well. Scope In: Scope Out: Findings:                 One benign-appearing, intrinsic mild stenosis was                            found 36 to 37 cm from the incisors. This stenosis                            measured less than one cm (in length). The stenosis                            was traversed. The scope was withdrawn. Dilation                            was performed with a Maloney dilator with no                            resistance at 54 Fr. The dilation site was examined                            following endoscope reinsertion and showed no                            change.                           The esophagus and gastroesophageal junction were                            examined with  white light and narrow band imaging                            (NBI) from a forward view and retroflexed position.                            There were esophageal mucosal changes suggestive of                            long-segment Barrett's esophagus. These changes                            involved the mucosa at the upper extent of the                            gastric folds (38 cm from the incisors) extending                            to the Z-line (36 cm from the incisors). Islands of                            salmon-colored mucosa were present from 36 to 40                            cm. The maximum longitudinal extent of these                            esophageal mucosal changes was 4 cm in length.                           The stomach was normal.                           The cardia and gastric fundus were normal on                            retroflexion.                           The examined duodenum was normal. Complications:            No immediate  complications. Estimated Blood Loss:     Estimated blood loss was minimal. Impression:               - Benign-appearing esophageal stenosis. Dilated.                           - Esophageal mucosal changes suggestive of                            long-segment Barrett's esophagus.                           - Normal stomach.                           -  Normal examined duodenum.                           - No specimens collected. Recommendation:           - Patient has a contact number available for                            emergencies. The signs and symptoms of potential                            delayed complications were discussed with the                            patient. Return to normal activities tomorrow.                            Written discharge instructions were provided to the                            patient.                           - Resume previous diet.                           - Continue present medications.                           - Await pathology results.                           - Follow an antireflux regimen.                           - Use Protonix (pantoprazole) 40 mg PO BID. Rx for                            90 days with 3 refills Napoleon Form, MD 06/17/2023 10:10:36 AM This report has been signed electronically.

## 2023-06-17 NOTE — Progress Notes (Signed)
 Called to room to assist during endoscopic procedure.  Patient ID and intended procedure confirmed with present staff. Received instructions for my participation in the procedure from the performing physician.

## 2023-06-17 NOTE — Progress Notes (Signed)
 Please refer to office visit note 06/14/23. No additional changes in H&P Patient is appropriate for planned procedure(s) and anesthesia in an ambulatory setting  K. Scherry Ran , MD [phone]

## 2023-06-17 NOTE — Progress Notes (Signed)
 Report given to PACU, vss

## 2023-06-17 NOTE — Progress Notes (Signed)
0943 Robinul 0.1 mg IV given due large amount of secretions upon assessment.  MD made aware, vss

## 2023-06-17 NOTE — Patient Instructions (Addendum)
 Resume previous diet and medications.  Education attached on Barrett's Esophagus.  Prescription for Protonix 40mg  twice daily sent to the pharmacy.    YOU HAD AN ENDOSCOPIC PROCEDURE TODAY AT THE [facility]:   Refer to the procedure report that was given to you for any specific questions about what was found during the examination.  If the procedure report does not answer your questions, please call your gastroenterologist to clarify.  If you requested that your care partner not be given the details of your procedure findings, then the procedure report has been included in a sealed envelope for you to review at your convenience later.  YOU SHOULD EXPECT: Some feelings of bloating in the abdomen. Passage of more gas than usual.  Walking can help get rid of the air that was put into your GI tract during the procedure and reduce the bloating. If you had a lower endoscopy (such as a colonoscopy or flexible sigmoidoscopy) you may notice spotting of blood in your stool or on the toilet paper. If you underwent a bowel prep for your procedure, you may not have a normal bowel movement for a few days.  Please Note:  You might notice some irritation and congestion in your nose or some drainage.  This is from the oxygen used during your procedure.  There is no need for concern and it should clear up in a day or so.  SYMPTOMS TO REPORT IMMEDIATELY:  Following upper endoscopy (EGD)  Vomiting of blood or coffee ground material  New chest pain or pain under the shoulder blades  Painful or persistently difficult swallowing  New shortness of breath  Fever of 100F or higher  Black, tarry-looking stools  For urgent or emergent issues, a gastroenterologist can be reached at any hour by calling [phone]. Do not use MyChart messaging for urgent concerns.    DIET:  We do recommend a small meal at first, but then you may proceed to your regular diet.  Drink plenty of fluids but you should avoid  alcoholic beverages for 24 hours.  ACTIVITY:  You should plan to take it easy for the rest of today and you should NOT DRIVE or use heavy machinery until tomorrow (because of the sedation medicines used during the test).    FOLLOW UP: Our staff will call the number listed on your records the next business day following your procedure.  We will call around 7:15- 8:00 am to check on you and address any questions or concerns that you may have regarding the information given to you following your procedure. If we do not reach you, we will leave a message.     If any biopsies were taken you will be contacted by phone or by letter within the next 1-3 weeks.  Please call us at [phone] if you have not heard about the biopsies in 3 weeks.    SIGNATURES/CONFIDENTIALITY: You and/or your care partner have signed paperwork which will be entered into your electronic medical record.  These signatures attest to the fact that that the information above on your After Visit Summary has been reviewed and is understood.  Full responsibility of the confidentiality of this discharge information lies with you and/or your care-partner.

## 2023-06-18 ENCOUNTER — Telehealth: Payer: Self-pay | Admitting: Gastroenterology

## 2023-06-18 MED ORDER — LINACLOTIDE 290 MCG PO CAPS: 290.0000 ug | ORAL_CAPSULE | Freq: Every day | ORAL | 3 refills | Status: AC

## 2023-06-18 MED ORDER — HYOSCYAMINE SULFATE 0.125 MG SL SUBL: 0.1250 mg | SUBLINGUAL_TABLET | Freq: Two times a day (BID) | SUBLINGUAL | 0 refills | Status: DC | PRN

## 2023-06-18 NOTE — Telephone Encounter (Signed)
 Levsin and Linzess resubmitted to [facility]  on [address]. Patient notified.

## 2023-06-18 NOTE — Telephone Encounter (Signed)
 Patient called stating that she went to get prescription for Linzess and they advised it was too soon to refill and has not have it refilled since May 18, 2023. Patient is requesting we reach out to pharmacy to discuss what is going on. Please advise.

## 2023-06-18 NOTE — Telephone Encounter (Signed)
 Inbound call from patient, would like Linzess and Levsin called into [facility] on [address]. Patient states Pantoprazole was called into that pharmacy but other medications did not.

## 2023-06-18 NOTE — Telephone Encounter (Signed)
 Patient requesting f/u call. In regards to prescription. Wanting prescription sent to [facility] on [address].

## 2023-06-21 ENCOUNTER — Other Ambulatory Visit: Payer: Self-pay

## 2023-06-21 LAB — SURGICAL PATHOLOGY

## 2023-06-21 MED ORDER — HYOSCYAMINE SULFATE 0.125 MG SL SUBL: SUBLINGUAL_TABLET | SUBLINGUAL | 0 refills | Status: DC

## 2023-06-21 NOTE — Telephone Encounter (Signed)
Inbound call from patient, following up on call below.

## 2023-06-21 NOTE — Telephone Encounter (Signed)
 Patient is not able to get hr rx for hyoscyamine, because [facility] is sending it out to her.  She is having a lot of spasms and requesting assistance until her new rx arrives but not before the 16 .  I contacted [facility] pharmacy to do a test run of the rx that has been sent.  They are able to dispense this afternoon.

## 2023-06-21 NOTE — Telephone Encounter (Signed)
 Patient returning call. Please advise

## 2023-06-21 NOTE — Telephone Encounter (Signed)
 Patient returning phone call. Please advise.

## 2023-06-21 NOTE — Telephone Encounter (Signed)
 Left message on machine to call back

## 2023-06-22 ENCOUNTER — Telehealth: Payer: Self-pay | Admitting: *Deleted

## 2023-06-22 NOTE — Telephone Encounter (Signed)
 Post procedure follow up call placed, no answer and left VM.

## 2023-06-23 ENCOUNTER — Other Ambulatory Visit: Payer: Self-pay | Admitting: Primary Care

## 2023-06-23 DIAGNOSIS — F419 Anxiety disorder, unspecified: Secondary | ICD-10-CM

## 2023-06-23 DIAGNOSIS — E039 Hypothyroidism, unspecified: Secondary | ICD-10-CM

## 2023-06-23 DIAGNOSIS — F32A Depression, unspecified: Secondary | ICD-10-CM

## 2023-07-07 ENCOUNTER — Other Ambulatory Visit: Payer: Self-pay | Admitting: Primary Care

## 2023-07-07 DIAGNOSIS — E559 Vitamin D deficiency, unspecified: Secondary | ICD-10-CM

## 2023-07-09 ENCOUNTER — Other Ambulatory Visit: Payer: Self-pay | Admitting: Gastroenterology

## 2023-07-22 ENCOUNTER — Ambulatory Visit: Payer: Self-pay | Admitting: Gastroenterology

## 2023-07-27 ENCOUNTER — Ambulatory Visit: Payer: 59 | Admitting: Gastroenterology

## 2023-07-28 ENCOUNTER — Encounter: Payer: Self-pay | Admitting: Cardiology

## 2023-08-03 ENCOUNTER — Ambulatory Visit: Payer: Self-pay

## 2023-08-03 NOTE — Telephone Encounter (Signed)
 Noted

## 2023-08-03 NOTE — Telephone Encounter (Signed)
 Chief Complaint: sore throat Symptoms: sore throat, headache,swollen lyphm node. Frequency: since Saturday Pertinent Negatives: Patient denies fever Disposition: [] ED /[x] Urgent Care (no appt availability in office) / [] Appointment(In office/virtual)/ []  [facility]/ [] Home Care/ [] Refused Recommended Disposition /[] [facility]/ []  Follow-up with PCP Additional Notes: Pt states she is experiencing a sore throat and some pain under her left cheek bone. States Saturday morning there was something on the back of her tonsils and states not white but looks like a gel. States gland under left cheek is swollen and sore. States pain 8/10 and has not tried anything OTC for pain. States using saline nasal wash and nose spray. States able to swallow liquids and soft foods. Disposition UC as pt can't come tomorrow and no appt available today    Copied From CRM [id number]. Reason for Triage: Patient says it hurts to swallow, she  has a headache, and there's something on her tonsils  - she requested appt but there's no available  appointments today     Reason for Disposition  SEVERE (e.g., excruciating) throat pain  Protocols used: Sore Throat-A-AH

## 2023-09-17 ENCOUNTER — Encounter: Payer: Self-pay | Admitting: *Deleted

## 2023-09-17 ENCOUNTER — Ambulatory Visit: Admitting: Primary Care

## 2023-09-17 ENCOUNTER — Encounter: Payer: Self-pay | Admitting: Primary Care

## 2023-09-17 VITALS — BP 116/64 | HR 65 | Temp 97.9°F | Ht 62.0 in | Wt 189.0 lb

## 2023-09-17 DIAGNOSIS — E559 Vitamin D deficiency, unspecified: Secondary | ICD-10-CM | POA: Diagnosis not present

## 2023-09-17 DIAGNOSIS — F419 Anxiety disorder, unspecified: Secondary | ICD-10-CM | POA: Diagnosis not present

## 2023-09-17 DIAGNOSIS — F32A Depression, unspecified: Secondary | ICD-10-CM | POA: Diagnosis not present

## 2023-09-17 DIAGNOSIS — R519 Headache, unspecified: Secondary | ICD-10-CM

## 2023-09-17 DIAGNOSIS — R5382 Chronic fatigue, unspecified: Secondary | ICD-10-CM

## 2023-09-17 NOTE — Assessment & Plan Note (Signed)
 Could be multifactorial including grief, untreated sleep apnea, metabolic cause.   Recommended she follow through on the sleep study. Increase fluoxetine  to 50 mg daily.   Await results.

## 2023-09-17 NOTE — Progress Notes (Signed)
 Subjective:    Patient ID: Abigail [name], female    DOB: 06/11/1965, 58 y.o.   MRN: [id number]  HPI  Abigail [name] is a very pleasant 58 y.o. female with a history of CAD, PVCs, hyperlipidemia, type 2 diabetes, hypothyroidism, obesity, suspected sleep apnea, anxiety ans depression, chronic back pain who presents today to discuss fatigue.  Over the last month she has felt symptoms such as fatigue, irritability, little motivation to do things.  Over the last 2 weeks her symptoms have progressed. She is falling asleep at her desk, wanting to fall asleep on the toilet. Her best friend of 38 years passed last month unexpectedly which has caused sadness. She is unhappy in [place] , only feels happy in [place]  as she is surrounded by supportive friends.   She's also noticed a constant headache to the left parietal, temporal lobe, behind left eye, left side of face with nausea. This began shortly after her traumatic fall with head injury in March 2025. No photophobia. Last eye exam was years ago. CT head was negative in March 2025.  She is managed on fluoxetine  40 mg daily for anxiety and depression, she feels this has been effective. In March 2025 her fluoxetine  was increased to 10 mg for increased anxiety/depression. She never took this dose. She is sleeping well at night. She never followed through with the sleep study for a diagnosis of sleep apnea.    Review of Systems  Constitutional:  Positive for fatigue.  Eyes:  Negative for photophobia and visual disturbance.  Respiratory:  Negative for shortness of breath.   Cardiovascular:  Negative for chest pain.  Gastrointestinal:  Positive for nausea. Negative for vomiting.  Neurological:  Positive for headaches.  Psychiatric/Behavioral:  The patient is nervous/anxious.        See HPI         Past Medical History:  Diagnosis Date   Anginal pain (HCC)    likely due to pleurisy   Anxiety    Biliary dyskinesia    Bulging  lumbar disc    CAP (community acquired pneumonia)    Cardiac arrhythmia due to congenital heart disease    no arrhythmia identified    Congenital heart disease    Depression    Fatty liver 05/2016   Frequent headaches    GERD (gastroesophageal reflux disease)    Heart disease    Hypertension    Hypokalemia 07/19/2021   Hypomagnesemia 07/19/2021   Hypothyroidism    Kidney stones    Migraine    Obesity (BMI 35.0-39.9 without comorbidity)    PVC's (premature ventricular contractions)    Right upper quadrant abdominal pain 11/29/2018   Sigmoid diverticulosis    T2DM (type 2 diabetes mellitus) (HCC)     Social History   Socioeconomic History   Marital status: Single    Spouse name: Not on file   Number of children: Not on file   Years of education: Not on file   Highest education level: Not on file  Occupational History   Not on file  Tobacco Use   Smoking status: Former    Current packs/day: 0.00    Types: Cigarettes    Quit date: 2008    Years since quitting: 17.5    Passive exposure: Past   Smokeless tobacco: Never   Tobacco comments:    used tobacco socially  Vaping Use   Vaping status: Never Used  Substance and Sexual Activity   Alcohol use: No  Alcohol/week: 0.0 standard drinks of alcohol   Drug use: No   Sexual activity: Not on file  Other Topics Concern   Not on file  Social History Narrative   Single.   Has a set of Twins, age 50.   Works for the [employer]    Enjoys relaxing, spending time with her children.   Social Drivers of Corporate investment banker Strain: Low Risk  (07/14/2023)   Received from [facility]   Overall Financial Resource Strain (CARDIA)    Difficulty of Paying Living Expenses: Not hard at all  Food Insecurity: No Food Insecurity (07/14/2023)   Received from [facility]   Hunger Vital Sign    Within the past 12 months, you worried that your food would run out before you got the money to buy more.: Never true     Within the past 12 months, the food you bought just didn't last and you didn't have money to get more.: Never true  Transportation Needs: No Transportation Needs (07/14/2023)   Received from [facility] - Transportation    Lack of Transportation (Medical): No    Lack of Transportation (Non-Medical): No  Physical Activity: Not on file  Stress: Not on file  Social Connections: Unknown (07/21/2021)   Received from [facility]   Social Network    Social Network: Not on file  Intimate Partner Violence: Unknown (06/12/2021)   Received from [facility]   HITS    Physically Hurt: Not on file    Insult or Talk Down To: Not on file    Threaten Physical Harm: Not on file    Scream or Curse: Not on file    Past Surgical History:  Procedure Laterality Date   ABDOMINAL EXPLORATION SURGERY  1986   COLONOSCOPY  06/2016   CYSTOSCOPY WITH RETROGRADE PYELOGRAM, URETEROSCOPY AND STENT PLACEMENT Left 01/27/2023   Procedure: CYSTOSCOPY, LEFT URETEROSCOPY and stone extraction;  Surgeon: Renda Glance, MD;  Location: [facility];  Service: Urology;  Laterality: Left;   CYSTOSCOPY/URETEROSCOPY/HOLMIUM LASER/STENT PLACEMENT Right 05/16/2021   Procedure: CYSTOSCOPY/URETEROSCOPY/HOLMIUM LASER/STENT PLACEMENT;  Surgeon: Selma Donnice SAUNDERS, MD;  Location: [facility];  Service: Urology;  Laterality: Right;   ESOPHAGEAL MANOMETRY N/A 12/06/2019   Procedure: ESOPHAGEAL MANOMETRY (EM);  Surgeon: Shila Gustav GAILS, MD;  Location: [facility];  Service: Endoscopy;  Laterality: N/A;   EXTRACORPOREAL SHOCK WAVE LITHOTRIPSY Left 12/03/2016   Procedure: LEFT EXTRACORPOREAL SHOCK WAVE LITHOTRIPSY (ESWL);  Surgeon: Watt Rush, MD;  Location: [facility];  Service: Urology;  Laterality: Left;   KIDNEY STONE SURGERY     LEFT HEART CATH AND CORONARY ANGIOGRAPHY  07/28/2016    Large tortuous coronary arteries no radiographic evidence of disease.  Not anginal chest pain.  Normal EF.   LITHOTRIPSY     NASAL SINUS SURGERY      ROTATOR CUFF REPAIR Right 12/27/2017   TRANSTHORACIC ECHOCARDIOGRAM  06/2017   EF 60-65%.  GR 1-2 DD.  Otherwise normal echo.  Normal valve function.  Normal wall motion.   TUBAL LIGATION      Family History  Problem Relation Age of Onset   Gallstones Mother    COPD Father    Emphysema Father    Colon cancer Neg Hx    Stomach cancer Neg Hx    Esophageal cancer Neg Hx    Pancreatic cancer Neg Hx    Liver disease Neg Hx    Colon polyps Neg Hx    Rectal cancer Neg  Hx     Allergies  Allergen Reactions   Latex Rash   Penicillins Nausea Only    Nausea and vomiting     Current Outpatient Medications on File Prior to Visit  Medication Sig Dispense Refill   Accu-Chek FastClix Lancets MISC USE 1 LANCET TO CHECK BLOOD SUGAR THREE TIMES DAILY 300 each 1   ADVIL  200 MG tablet Take 200-600 mg by mouth every 6 (six) hours as needed for mild pain or headache.     albuterol  (PROAIR  HFA) 108 (90 Base) MCG/ACT inhaler Inhale 2 puffs into the lungs every 6 (six) hours as needed for wheezing or shortness of breath. 8 g 0   Aspirin -Caffeine (BAYER BACK & BODY PO) Take 1 tablet by mouth daily as needed (body pain).     Blood Glucose Monitoring Suppl (ACCU-CHEK GUIDE) w/Device KIT by Does not apply route.     Cholecalciferol (VITAMIN D3) 1.25 MG (50000 UT) CAPS Take 1 capsule by mouth once a week 12 capsule 0   clotrimazole  (LOTRIMIN ) 1 % cream Apply to affected area 2 times daily 15 g 0   FLUoxetine  (PROZAC ) 40 MG capsule TAKE 1 CAPSULE BY MOUTH ONCE DAILY FOR ANXIETY 90 capsule 2   hyoscyamine  (LEVSIN  SL) 0.125 MG SL tablet DISSOLVE 1 TABLET UNDER THE  TONGUE TWICE DAILY AS NEEDED FOR SPASMS 60 tablet 11   Lancets Misc. (ACCU-CHEK FASTCLIX LANCET) KIT Use as directed to test blood sugar daily 1 kit 0   levothyroxine  (SYNTHROID ) 75 MCG tablet TAKE 1 TABLET BY MOUTH IN THE MORNING ON  AN  EMPTY  STOMACH  WITH  WATER   ONLY  NO  FOOD  OR  OTHER  MEDICATIONS  FOR  30  MINUTES 90 tablet 2   linaclotide   (LINZESS ) 290 MCG CAPS capsule Take 1 capsule (290 mcg total) by mouth daily before breakfast. 90 capsule 3   metoprolol  tartrate (LOPRESSOR ) 25 MG tablet Take 1 tablet (25 mg total) by mouth 2 (two) times daily. 180 tablet 3   oxybutynin  (DITROPAN -XL) 10 MG 24 hr tablet Take 10 mg by mouth at bedtime.     pantoprazole  (PROTONIX ) 40 MG tablet Take 1 tablet (40 mg total) by mouth 2 (two) times daily. 180 tablet 0   rosuvastatin  (CRESTOR ) 20 MG tablet TAKE 1 TABLET BY MOUTH ONCE DAILY .  PLEASE  KEEP  UPCOMING  APPOINTMENT  ON  03/30/23  IN  ORDER  TO  RECEIVE  FUTURE  REFILLS 90 tablet 3   traMADol  (ULTRAM ) 50 MG tablet Take 1-2 tablets (50-100 mg total) by mouth every 6 (six) hours as needed (pain). 8 tablet 0   HYDROcodone -acetaminophen  (NORCO/VICODIN) 5-325 MG tablet Take 1 tablet by mouth every 6 (six) hours as needed. (Patient not taking: Reported on 09/17/2023)     ondansetron  (ZOFRAN -ODT) 4 MG disintegrating tablet Take 1 tablet (4 mg total) by mouth every 8 (eight) hours as needed for nausea or vomiting. (Patient not taking: Reported on 09/17/2023) 15 tablet 0   tamsulosin  (FLOMAX ) 0.4 MG CAPS capsule Take 1 capsule (0.4 mg total) by mouth daily. As needed for kidney stone (Patient not taking: Reported on 09/17/2023) 30 capsule 0   No current facility-administered medications on file prior to visit.    BP 116/64   Pulse 65   Temp 97.9 F (36.6 C) (Temporal)   Ht 5' 2 (1.575 m)   Wt 189 lb (85.7 kg)   LMP 08/12/2015 Comment: patient states hx. tubal ligation  SpO2 96%  BMI 34.57 kg/m  Objective:   Physical Exam Eyes:     Extraocular Movements: Extraocular movements intact.  Cardiovascular:     Rate and Rhythm: Normal rate and regular rhythm.  Pulmonary:     Effort: Pulmonary effort is normal.     Breath sounds: Normal breath sounds.  Musculoskeletal:     Cervical back: Neck supple.  Skin:    General: Skin is warm and dry.  Neurological:     Mental Status: She is alert and  oriented to person, place, and time.     Cranial Nerves: No cranial nerve deficit.     Coordination: Coordination normal.  Psychiatric:        Mood and Affect: Mood normal.           Assessment & Plan:  Anxiety and depression Assessment & Plan: Deteriorated given recent circumstances. Condolences provided.   Increase fluoxetine  to 50 mg daily. She will continue her 40 mg dose and fill the prescription for the 10 mg dose.  She will update.    Chronic fatigue Assessment & Plan: Could be multifactorial including grief, untreated sleep apnea, metabolic cause.   Recommended she follow through on the sleep study. Increase fluoxetine  to 50 mg daily.   Await results.   Orders: -     CBC -     TSH -     Basic metabolic panel with GFR -     Vitamin B12  Vitamin D  deficiency -     VITAMIN D  25 Hydroxy (Vit-D Deficiency, Fractures)  Frequent headaches Assessment & Plan: Given prior head trauma, coupled with prior head injury, will obtain MRI brain.   MR brain with contrast ordered and pending. Will check sed rate given temporal involvement.  Orders: -     Sedimentation rate -     MR BRAIN WO CONTRAST; Future        Jaivion Kingsley K Montrey Buist, NP

## 2023-09-17 NOTE — Assessment & Plan Note (Signed)
 Given prior head trauma, coupled with prior head injury, will obtain MRI brain.   MR brain with contrast ordered and pending. Will check sed rate given temporal involvement.

## 2023-09-17 NOTE — Patient Instructions (Addendum)
 Start taking the fluoxetine  10 mg dose along with the 40 mg dose. Pick this up from the pharmacy.  Stop by the lab prior to leaving today. I will notify you of your results once received.   You will receive a phone call regarding the MRI of your brain.

## 2023-09-17 NOTE — Assessment & Plan Note (Signed)
 Deteriorated given recent circumstances. Condolences provided.   Increase fluoxetine  to 50 mg daily. She will continue her 40 mg dose and fill the prescription for the 10 mg dose.  She will update.

## 2023-09-17 NOTE — Addendum Note (Signed)
 Addended by: ISADORA RAISIN on: 09/17/2023 03:52 PM   Modules accepted: Orders

## 2023-09-18 LAB — BASIC METABOLIC PANEL WITH GFR
BUN: 12 mg/dL (ref 7–25)
CO2: 25 mmol/L (ref 20–32)
Calcium: 8.9 mg/dL (ref 8.6–10.4)
Chloride: 109 mmol/L (ref 98–110)
Creat: 0.89 mg/dL (ref 0.50–1.03)
Glucose, Bld: 93 mg/dL (ref 65–99)
Potassium: 4.3 mmol/L (ref 3.5–5.3)
Sodium: 144 mmol/L (ref 135–146)
eGFR: 75 mL/min/1.73m2 (ref >=60)

## 2023-09-18 LAB — VITAMIN D 25 HYDROXY (VIT D DEFICIENCY, FRACTURES): Vit D, 25-Hydroxy: 97 ng/mL (ref 30–100)

## 2023-09-18 LAB — CBC
HCT: 38.1 % (ref 35.0–45.0)
Hemoglobin: 12.2 g/dL (ref 11.7–15.5)
MCH: 28.2 pg (ref 27.0–33.0)
MCHC: 32 g/dL (ref 32.0–36.0)
MCV: 88.2 fL (ref 80.0–100.0)
MPV: 10.3 fL (ref 7.5–12.5)
Platelets: 260 Thousand/uL (ref 140–400)
RBC: 4.32 Million/uL (ref 3.80–5.10)
RDW: 14 % (ref 11.0–15.0)
WBC: 7 Thousand/uL (ref 3.8–10.8)

## 2023-09-18 LAB — SEDIMENTATION RATE: Sed Rate: 6 mm/h (ref 0–30)

## 2023-09-18 LAB — TSH: TSH: 2.81 m[IU]/L (ref 0.40–4.50)

## 2023-09-18 LAB — VITAMIN B12: Vitamin B-12: 274 pg/mL (ref 200–1100)

## 2023-09-19 ENCOUNTER — Ambulatory Visit: Payer: Self-pay | Admitting: Primary Care

## 2023-09-19 DIAGNOSIS — R519 Headache, unspecified: Secondary | ICD-10-CM

## 2023-09-19 DIAGNOSIS — G44329 Chronic post-traumatic headache, not intractable: Secondary | ICD-10-CM

## 2023-09-25 ENCOUNTER — Ambulatory Visit
Admission: RE | Admit: 2023-09-25 | Discharge: 2023-09-25 | Disposition: A | Discharge status: Home / Self Care | Source: Ambulatory Visit | Attending: Primary Care

## 2023-09-25 DIAGNOSIS — R519 Headache, unspecified: Secondary | ICD-10-CM

## 2023-09-26 ENCOUNTER — Other Ambulatory Visit: Payer: Self-pay | Admitting: Primary Care

## 2023-09-26 DIAGNOSIS — E559 Vitamin D deficiency, unspecified: Secondary | ICD-10-CM

## 2023-09-26 NOTE — Telephone Encounter (Signed)
 Please call patient:  Vitamin D  levels recently looked great. She can transition from the once weekly vitamin D  to an OTC 5000 international units  capsule daily

## 2023-09-27 NOTE — Telephone Encounter (Signed)
 Unable to reach patient. Left voicemail to return call to our office.

## 2023-09-28 NOTE — Telephone Encounter (Unsigned)
 Copied from CRM [id number]. Topic: General - Other >> Sep 28, 2023 11:50 AM Robinson H wrote: Reason for CRM: Patient returning call to [facility], please reach back out, thanks.  [facility] [phone]

## 2023-09-28 NOTE — Telephone Encounter (Signed)
 Unable to reach patient. Left voicemail to return call to our office.

## 2023-09-29 NOTE — Telephone Encounter (Signed)
 Informed the patient of the following:  We do not ahve the results of her MRI back yet. Abigail [name] will reach out when those are avaliable to us . Patient is aware of info regarding the vitamin d 

## 2023-09-30 ENCOUNTER — Other Ambulatory Visit: Payer: Self-pay | Admitting: Primary Care

## 2023-09-30 DIAGNOSIS — E559 Vitamin D deficiency, unspecified: Secondary | ICD-10-CM

## 2023-10-01 DIAGNOSIS — G44329 Chronic post-traumatic headache, not intractable: Secondary | ICD-10-CM

## 2023-10-01 DIAGNOSIS — R110 Nausea: Secondary | ICD-10-CM

## 2023-10-01 MED ORDER — ONDANSETRON 4 MG PO TBDP
4.0000 mg | ORAL_TABLET | Freq: Three times a day (TID) | ORAL | 0 refills | Status: AC | PRN
Start: 2023-10-01 — End: ?

## 2023-10-02 ENCOUNTER — Other Ambulatory Visit: Payer: Self-pay | Admitting: Gastroenterology

## 2023-10-03 MED ORDER — TOPIRAMATE 50 MG PO TABS: 50.0000 mg | ORAL_TABLET | Freq: Every day | ORAL | 0 refills | Status: DC

## 2023-10-11 NOTE — Telephone Encounter (Signed)
This needs an office visit.

## 2023-11-09 ENCOUNTER — Encounter: Payer: Self-pay | Admitting: Family Medicine

## 2023-11-09 ENCOUNTER — Ambulatory Visit: Admitting: Family Medicine

## 2023-11-09 ENCOUNTER — Ambulatory Visit: Payer: Self-pay

## 2023-11-09 VITALS — BP 110/60 | HR 73 | Temp 98.4°F | Ht 62.0 in | Wt 188.4 lb

## 2023-11-09 DIAGNOSIS — J018 Other acute sinusitis: Secondary | ICD-10-CM | POA: Diagnosis not present

## 2023-11-09 DIAGNOSIS — N3001 Acute cystitis with hematuria: Secondary | ICD-10-CM | POA: Diagnosis not present

## 2023-11-09 DIAGNOSIS — R3915 Urgency of urination: Secondary | ICD-10-CM | POA: Diagnosis not present

## 2023-11-09 LAB — POC URINALSYSI DIPSTICK (AUTOMATED)
Bilirubin, UA: NEGATIVE
Glucose, UA: NEGATIVE
Ketones, UA: NEGATIVE
Nitrite, UA: NEGATIVE
Protein, UA: POSITIVE — AB
Spec Grav, UA: >=1.03 — AB (ref 1.010–1.025)
Urobilinogen, UA: 0.2 U/dL
pH, UA: 6 (ref 5.0–8.0)

## 2023-11-09 MED ORDER — BENZONATATE 100 MG PO CAPS: 100.0000 mg | ORAL_CAPSULE | Freq: Two times a day (BID) | ORAL | 0 refills | Status: AC | PRN

## 2023-11-09 MED ORDER — FLUCONAZOLE 150 MG PO TABS: 150.0000 mg | ORAL_TABLET | Freq: Once | ORAL | 0 refills | Status: AC

## 2023-11-09 MED ORDER — CIPROFLOXACIN HCL 250 MG PO TABS: 250.0000 mg | ORAL_TABLET | Freq: Two times a day (BID) | ORAL | 0 refills | Status: AC

## 2023-11-09 NOTE — Telephone Encounter (Signed)
 FYI Only or Action Required?: FYI only for provider.  Patient was last seen in primary care on 09/17/2023 by Gretta Comer POUR, NP.  Called Nurse Triage reporting Urinary Frequency (/).  Symptoms began a week ago.  Interventions attempted: Rest, hydration, or home remedies.  Symptoms are: gradually worsening.  Triage Disposition: See PCP When Office is Open (Within 3 Days)  Patient/caregiver understands and will follow disposition?: Yes   Copied from CRM #[id number]. Topic: Clinical - Red Word Triage >> Nov 09, 2023  8:13 AM Rosina BIRCH wrote: Reason for RMF:enddpaoz UTI, sinus infection, ears achy  patient stated she blows out form her nose dark green mucus Reason for Disposition  [1] Sinus congestion (pressure, fullness) AND [2] present > 10 days  Answer Assessment - Initial Assessment Questions 1. LOCATION: Where does it hurt?      Sinus pressure around nose and ears 2. ONSET: When did the sinus pain start?  (e.g., hours, days)      X 2 days 3. SEVERITY: How bad is the pain?   (Scale 0-10; or none, mild, moderate or severe)     moderate 4. RECURRENT SYMPTOM: Have you ever had sinus problems before? If Yes, ask: When was the last time? and What happened that time?      Yes - sinus infection 5. NASAL CONGESTION: Is the nose blocked? If Yes, ask: Can you open it or must you breathe through your mouth?     yes 6. NASAL DISCHARGE: Do you have discharge from your nose? If so ask, What color?     Dark green/ thick 7. FEVER: Do you have a fever? If Yes, ask: What is it, how was it measured, and when did it start?      no 8. OTHER SYMPTOMS: Do you have any other symptoms? (e.g., sore throat, cough, earache, difficulty breathing)     Bilateral ears - discomfort, stuffy nose 9. PREGNANCY: Is there any chance you are pregnant? When was your last menstrual period?     na  Answer Assessment - Initial Assessment Questions 1. SYMPTOM: What's the main symptom  you're concerned about? (e.g., frequency, incontinence)     Frequency, urgency, pressure 2. ONSET: When did the   start?     10/21/2023 3. PAIN: Is there any pain? If Yes, ask: How bad is it? (Scale: 1-10; mild, moderate, severe)     moderate 4. CAUSE: What do you think is causing the symptoms?     UTI 5. OTHER SYMPTOMS: Do you have any other symptoms? (e.g., blood in urine, fever, flank pain, pain with urination)     Low back pain, burning with urination, at times blood in urine  6. PREGNANCY: Is there any chance you are pregnant? When was your last menstrual period?     Na  Urology found bacteria in urine.  Protocols used: Urinary Symptoms-A-AH, Sinus Pain or Congestion-A-AH

## 2023-11-09 NOTE — Progress Notes (Signed)
 Patient ID: Abigail [name], female    DOB: 09/20/1965, 58 y.o.   MRN: [id number]  This visit was conducted in person.  BP 110/60   Pulse 73   Temp 98.4 F (36.9 C) (Temporal)   Ht 5' 2 (1.575 m)   Wt 188 lb 6 oz (85.4 kg)   LMP 08/12/2015 Comment: patient states hx. tubal ligation  SpO2 97%   BMI 34.45 kg/m    CC:  Chief Complaint  Patient presents with   Urinary Urgency   Back Pain   Nasal Congestion   Otalgia         Subjective:   HPI: Abigail [name] is a 58 y.o. female presenting on 11/09/2023 for Urinary Urgency, Back Pain, Nasal Congestion, and Otalgia (/)  New onset  1 month ago... cough, nasal congestion. Comes and goes.  Post nasal drip.  NO fever.  Npo SOB.  Bilateral ear aching.  No facial pain.  Frequent has this issue with change in season.   At same time 1 month ago she saw blood in urine.. saw urologist on 10/12/2023 Dx with kidney stine. Restarted tamsulposin and fluids... passed the stone.  Saw Uro again on 11/21/2023... reported bladder pressure, low back pain, urinary urgency and dysuria. No fever. No flank pain.  Urine culture grew bacteria... never provided antibiotics.  Using zofran  prn.     Taking homemade pickles to clear sinus.  Using old tessalon  perles.  Occ using nasacort.  She is nonsmoker, some reactive airway issues in past USing albuterol  to use prn.  Relevant past medical, surgical, family and social history reviewed and updated as indicated. Interim medical history since our last visit reviewed. Allergies and medications reviewed and updated. Outpatient Medications Prior to Visit  Medication Sig Dispense Refill   Accu-Chek FastClix Lancets MISC USE 1 LANCET TO CHECK BLOOD SUGAR THREE TIMES DAILY 300 each 1   ADVIL  200 MG tablet Take 200-600 mg by mouth every 6 (six) hours as needed for mild pain or headache.     albuterol  (PROAIR  HFA) 108 (90 Base) MCG/ACT inhaler Inhale 2 puffs into the lungs every 6 (six) hours as  needed for wheezing or shortness of breath. 8 g 0   Aspirin -Caffeine (BAYER BACK & BODY PO) Take 1 tablet by mouth daily as needed (body pain).     Blood Glucose Monitoring Suppl (ACCU-CHEK GUIDE) w/Device KIT by Does not apply route.     clotrimazole  (LOTRIMIN ) 1 % cream Apply to affected area 2 times daily 15 g 0   FLUoxetine  (PROZAC ) 40 MG capsule TAKE 1 CAPSULE BY MOUTH ONCE DAILY FOR ANXIETY 90 capsule 2   hyoscyamine  (LEVSIN  SL) 0.125 MG SL tablet DISSOLVE 1 TABLET UNDER THE  TONGUE TWICE DAILY AS NEEDED FOR SPASMS 60 tablet 11   Lancets Misc. (ACCU-CHEK FASTCLIX LANCET) KIT Use as directed to test blood sugar daily 1 kit 0   levothyroxine  (SYNTHROID ) 75 MCG tablet TAKE 1 TABLET BY MOUTH IN THE MORNING ON  AN  EMPTY  STOMACH  WITH  WATER   ONLY  NO  FOOD  OR  OTHER  MEDICATIONS  FOR  30  MINUTES 90 tablet 2   linaclotide  (LINZESS ) 290 MCG CAPS capsule Take 1 capsule (290 mcg total) by mouth daily before breakfast. 90 capsule 3   metoprolol  tartrate (LOPRESSOR ) 25 MG tablet Take 1 tablet (25 mg total) by mouth 2 (two) times daily. 180 tablet 3   ondansetron  (ZOFRAN -ODT) 4 MG disintegrating tablet Take  1 tablet (4 mg total) by mouth every 8 (eight) hours as needed for nausea or vomiting. 20 tablet 0   oxybutynin  (DITROPAN -XL) 10 MG 24 hr tablet Take 10 mg by mouth at bedtime.     pantoprazole  (PROTONIX ) 40 MG tablet Take 1 tablet by mouth twice daily 180 tablet 0   rosuvastatin  (CRESTOR ) 20 MG tablet TAKE 1 TABLET BY MOUTH ONCE DAILY .  PLEASE  KEEP  UPCOMING  APPOINTMENT  ON  03/30/23  IN  ORDER  TO  RECEIVE  FUTURE  REFILLS 90 tablet 3   topiramate  (TOPAMAX ) 50 MG tablet Take 1 tablet (50 mg total) by mouth at bedtime. For headache prevention 90 tablet 0   traMADol  (ULTRAM ) 50 MG tablet Take 1-2 tablets (50-100 mg total) by mouth every 6 (six) hours as needed (pain). 8 tablet 0   HYDROcodone -acetaminophen  (NORCO/VICODIN) 5-325 MG tablet Take 1 tablet by mouth every 6 (six) hours as needed.  (Patient not taking: Reported on 09/17/2023)     tamsulosin  (FLOMAX ) 0.4 MG CAPS capsule Take 1 capsule (0.4 mg total) by mouth daily. As needed for kidney stone (Patient not taking: Reported on 09/17/2023) 30 capsule 0   No facility-administered medications prior to visit.     Per HPI unless specifically indicated in ROS section below Review of Systems  Constitutional:  Negative for fatigue and fever.  HENT:  Positive for congestion.   Eyes:  Negative for pain.  Respiratory:  Negative for cough and shortness of breath.   Cardiovascular:  Negative for chest pain, palpitations and leg swelling.  Gastrointestinal:  Positive for nausea. Negative for abdominal pain.  Genitourinary:  Positive for dysuria and urgency. Negative for vaginal bleeding.  Musculoskeletal:  Negative for back pain.  Neurological:  Negative for syncope, light-headedness and headaches.  Psychiatric/Behavioral:  Negative for dysphoric mood.    Objective:  BP 110/60   Pulse 73   Temp 98.4 F (36.9 C) (Temporal)   Ht 5' 2 (1.575 m)   Wt 188 lb 6 oz (85.4 kg)   LMP 08/12/2015 Comment: patient states hx. tubal ligation  SpO2 97%   BMI 34.45 kg/m   Wt Readings from Last 3 Encounters:  11/09/23 188 lb 6 oz (85.4 kg)  09/17/23 189 lb (85.7 kg)  06/17/23 190 lb (86.2 kg)      Physical Exam Constitutional:      General: She is not in acute distress.    Appearance: Normal appearance. She is well-developed. She is not ill-appearing or toxic-appearing.  HENT:     Head: Normocephalic.     Right Ear: Hearing, tympanic membrane, ear canal and external ear normal. Tympanic membrane is not erythematous, retracted or bulging.     Left Ear: Hearing, tympanic membrane, ear canal and external ear normal. Tympanic membrane is not erythematous, retracted or bulging.     Nose: No mucosal edema or rhinorrhea.     Right Sinus: No maxillary sinus tenderness or frontal sinus tenderness.     Left Sinus: No maxillary sinus tenderness  or frontal sinus tenderness.     Mouth/Throat:     Pharynx: Uvula midline.  Eyes:     General: Lids are normal. Lids are everted, no foreign bodies appreciated.     Conjunctiva/sclera: Conjunctivae normal.     Pupils: Pupils are equal, round, and reactive to light.  Neck:     Thyroid : No thyroid  mass or thyromegaly.     Vascular: No carotid bruit.     Trachea: Trachea normal.  Cardiovascular:     Rate and Rhythm: Normal rate and regular rhythm.     Pulses: Normal pulses.     Heart sounds: Normal heart sounds, S1 normal and S2 normal. No murmur heard.    No friction rub. No gallop.  Pulmonary:     Effort: Pulmonary effort is normal. No tachypnea or respiratory distress.     Breath sounds: Normal breath sounds. No decreased breath sounds, wheezing, rhonchi or rales.  Abdominal:     General: Bowel sounds are normal.     Palpations: Abdomen is soft.     Tenderness: There is abdominal tenderness in the left lower quadrant. There is no right CVA tenderness or left CVA tenderness.  Musculoskeletal:     Cervical back: Normal range of motion and neck supple.  Skin:    General: Skin is warm and dry.     Findings: No rash.  Neurological:     Mental Status: She is alert.  Psychiatric:        Mood and Affect: Mood is not anxious or depressed.        Speech: Speech normal.        Behavior: Behavior normal. Behavior is cooperative.        Thought Content: Thought content normal.        Judgment: Judgment normal.       Results for orders placed or performed in visit on 11/09/23  POCT Urinalysis Dipstick (Automated)   Collection Time: 11/09/23  2:51 PM  Result Value Ref Range   Color, UA Yellow    Clarity, UA Clear    Glucose, UA Negative Negative   Bilirubin, UA Negative    Ketones, UA Negative    Spec Grav, UA >=1.030 (A) 1.010 - 1.025   Blood, UA Trace    pH, UA 6.0 5.0 - 8.0   Protein, UA Positive (A) Negative   Urobilinogen, UA 0.2 0.2 or 1.0 E.U./dL   Nitrite, UA Negative     Leukocytes, UA Small (1+) (A) Negative    Assessment and Plan  PCN allergy  Urinary urgency -     POCT Urinalysis Dipstick (Automated) -     Urine Culture  Acute non-recurrent sinusitis of other sinus Assessment & Plan: Acute, most likely allergic sinusitis. No clear sign of bacterial infection but symptoms have been going on for greater than 1 month. Recommend restarting Nasacort 2 sprays per nostril daily as well as nasal saline irrigation. Will choose an antibiotic to treat urinary tract infection as well as to cover possible early sinus infection.  Return and ER precautions provided   Acute cystitis with hematuria Assessment & Plan: Acute, per patient positive urine culture from [facility] urology following nephrolithiasis. Attempted to get urine culture results from [facility] but had to leave a message. Urinalysis performed and continues to be positive in office today.  Patient with continued symptoms as well. Will send urine for culture. Will treat with antibiotics for presumed urinary tract infection: Chose Cipro  250 mg p.o. 3 times daily given patient complicated with recent nephrolithiasis.  Return and ER precautions provided.   Other orders -     Ciprofloxacin  HCl; Take 1 tablet (250 mg total) by mouth 2 (two) times daily for 3 days.  Dispense: 6 tablet; Refill: 0 -     Fluconazole ; Take 1 tablet (150 mg total) by mouth once for 1 dose.  Dispense: 1 tablet; Refill: 0 -     Benzonatate ; Take 1 capsule (100 mg total) by mouth  2 (two) times daily as needed for cough.  Dispense: 20 capsule; Refill: 0    No follow-ups on file.   Abigail Ring, MD

## 2023-11-09 NOTE — Telephone Encounter (Signed)
 Patient scheduled to see Dr. Avelina today

## 2023-11-09 NOTE — Assessment & Plan Note (Signed)
 Acute, per patient positive urine culture from [facility] urology following nephrolithiasis. Attempted to get urine culture results from [facility] but had to leave a message. Urinalysis performed and continues to be positive in office today.  Patient with continued symptoms as well. Will send urine for culture. Will treat with antibiotics for presumed urinary tract infection: Chose Cipro  250 mg p.o. 3 times daily given patient complicated with recent nephrolithiasis.  Return and ER precautions provided.

## 2023-11-09 NOTE — Assessment & Plan Note (Addendum)
 Acute, most likely allergic sinusitis. No clear sign of bacterial infection but symptoms have been going on for greater than 1 month. Recommend restarting Nasacort 2 sprays per nostril daily as well as nasal saline irrigation. Will choose an antibiotic to treat urinary tract infection as well as to cover possible early sinus infection.  Return and ER precautions provided

## 2023-11-11 ENCOUNTER — Ambulatory Visit: Payer: Self-pay | Admitting: Family Medicine

## 2023-11-11 LAB — URINE CULTURE
MICRO NUMBER:: 16910177
Result:: NO GROWTH
SPECIMEN QUALITY:: ADEQUATE

## 2023-11-30 ENCOUNTER — Telehealth: Payer: Self-pay

## 2023-11-30 NOTE — Telephone Encounter (Signed)
 Patient returning call, wants to speak with beth about appointment on 9/30. Please review and advise   Thank you

## 2023-11-30 NOTE — Telephone Encounter (Signed)
 Please advise patient to use Levsin  SL PRN and anti reflux measures. Please bring her in for urgent f/u with me or APP. If worsening chest pain, will need to come to ER to exclude cardiac etiology

## 2023-11-30 NOTE — Telephone Encounter (Signed)
 Spoke with the patient. Advised of recommendations. Able to move the appointment to tomorrow at 8:40 am with Camie Furbish, PA. Patient agrees to the plan of care. States she did have improvement after the first dilation of her esophagus.

## 2023-11-30 NOTE — Telephone Encounter (Signed)
 Very bad esophageal spasm today feel like I'm having a heart attack hurts in the middle of my chest and into my esophagus. Has not eaten this morning. Drank water  and boom, it hit. Felt like she needed to vomit but did not. Cold chills. Took hyoscyamine  SL which has helped some. She then ate her scrambled eggs with broccoli. She ranks her discomfort at a 5. Her spasms are reported to be occurring more frequently. Declines ER.

## 2023-12-01 ENCOUNTER — Encounter: Payer: Self-pay | Admitting: Gastroenterology

## 2023-12-01 ENCOUNTER — Ambulatory Visit: Admitting: Gastroenterology

## 2023-12-01 VITALS — BP 102/62 | HR 69 | Ht 62.0 in | Wt 187.5 lb

## 2023-12-01 DIAGNOSIS — R1310 Dysphagia, unspecified: Secondary | ICD-10-CM | POA: Diagnosis not present

## 2023-12-01 DIAGNOSIS — K219 Gastro-esophageal reflux disease without esophagitis: Secondary | ICD-10-CM | POA: Diagnosis not present

## 2023-12-01 DIAGNOSIS — K625 Hemorrhage of anus and rectum: Secondary | ICD-10-CM

## 2023-12-01 DIAGNOSIS — K224 Dyskinesia of esophagus: Secondary | ICD-10-CM

## 2023-12-01 DIAGNOSIS — Z860100 Personal history of colon polyps, unspecified: Secondary | ICD-10-CM

## 2023-12-01 DIAGNOSIS — R079 Chest pain, unspecified: Secondary | ICD-10-CM

## 2023-12-01 DIAGNOSIS — R1319 Other dysphagia: Secondary | ICD-10-CM

## 2023-12-01 DIAGNOSIS — K649 Unspecified hemorrhoids: Secondary | ICD-10-CM

## 2023-12-01 MED ORDER — DEXLANSOPRAZOLE 60 MG PO CPDR: 60.0000 mg | DELAYED_RELEASE_CAPSULE | Freq: Every day | ORAL | 3 refills | Status: AC

## 2023-12-01 MED ORDER — HYDROCORTISONE ACETATE 25 MG RE SUPP: 25.0000 mg | Freq: Two times a day (BID) | RECTAL | 1 refills | Status: AC

## 2023-12-01 NOTE — Patient Instructions (Signed)
 We have sent the following medications to your pharmacy for you to pick up at your convenience:  Dexilant  - discontinue Protonix .  Anusol  suppositories  Take Hyoscyamine  twice a day.  Go the ER if chest pain worsens.  _______________________________________________________  If your blood pressure at your visit was 140/90 or greater, please contact your primary care physician to follow up on this.  _______________________________________________________  If you are age 19 or older, your body mass index should be between 23-30. Your Body mass index is 34.29 kg/m. If this is out of the aforementioned range listed, please consider follow up with your Primary Care Provider.  If you are age 55 or younger, your body mass index should be between 19-25. Your Body mass index is 34.29 kg/m. If this is out of the aformentioned range listed, please consider follow up with your Primary Care Provider.   ________________________________________________________  The [facility] providers would like to encourage you to use MYCHART to communicate with providers for non-urgent requests or questions.  Due to long hold times on the telephone, sending your provider a message by [facility] may be a faster and more efficient way to get a response.  Please allow 48 business hours for a response.  Please remember that this is for non-urgent requests.  _______________________________________________________  [facility] is using a team-based approach to care.  Your team is made up of your doctor and two to three APPS. Our APPS (Nurse Practitioners and Physician Assistants) work with your physician to ensure care continuity for you. They are fully qualified to address your health concerns and develop a treatment plan. They communicate directly with your gastroenterologist to care for you. Seeing the Advanced Practice Practitioners on your physician's team can help you by facilitating care more promptly, often  allowing for earlier appointments, access to diagnostic testing, procedures, and other specialty referrals.

## 2023-12-01 NOTE — Progress Notes (Signed)
 Abigail [name] [id number] 1965-11-05   Chief Complaint: Esophageal spasms  Referring Provider: Gretta Comer POUR, NP Primary GI MD: Dr. Shila  HPI: Abigail [name] is a 58 y.o. female with past medical history of anxiety/depression, congenital heart disease, fatty liver, GERD, HTN, hypothyroidism, obesity, diverticulosis, T2DM, kidney stones, migraines who presents today for a complaint of esophageal spasm.    Last seen in office 06/14/2023 by Dr. Nandigam for intermittent chest pain and dysphagia.  Previous cardiac evaluations including EKG and cardiac CT were unremarkable.  She was scheduled for EGD with esophageal dilation and advised to continue PPI and Carafate .  She underwent EGD 06/17/2023 with finding of a benign-appearing esophageal stenosis which was dilated and mucosal changes suggestive of long segment Barrett's.  Pathology negative for Barrett's and showed minimal chronic gastritis.  Patient called 11/30/2023 reporting a very bad esophageal spasm today, feel like I am having a heart attack hurts in the middle of my chest and into my esophagus.  Endorsed cold chills.  Took hyoscyamine  which helped some.  Reported her spasms occurring more frequently.  Declined to go to the ER.  Dr. Shila advised patient to use Levsin  SL as needed and antireflux measures.    Patient states she has been having worsening chest pain over the last 2 weeks.  Often occurs after eating but can occur at other times, such as while she is standing in the kitchen cooking, walking, or at rest.  Experiences severe pain in the center of her chest which can radiate to her shoulders, her back, her right lateral chest wall, and up to her neck.  Yesterday states all she did was take medication and drink water  in the morning and by the time she got to work she was having severe pain in the center of her chest which was 10/10.  Made it through the workday and went home, ate some sourdough bread,  took Protonix  and hyoscyamine , and laid in her bed with the head of the bed elevated.  States that pain was coming and going while she was resting.  It did also wake her up from sleep last night.  She was considering calling 911 but did not.  Currently pain is 3/10, states it was 7/10 this morning but improved a little bit after she took hyoscyamine .  Pain feels similar to what she experienced prior to endoscopy in April.  States that after having her esophagus dilated she was feeling great for months, and just recently started having recurrence of symptoms.  Scribes pain as sharp, squeezing.  States it feels like food sits in her chest and will go up or down.  States her weight is stable.  Not having difficulty swallowing fluids but does sometimes have discomfort/pain with swallowing fluids.  Has been only drinking room temperature liquids and does not drink anything cold.  In the last month or so has also felt nauseated after eating.  States that sometimes she has pain in her arms bilaterally, hands go numb but this is not new.  Yesterday felt very cold, states her hands were ice cold and she had to go outside to get warm.  Recently found out through her mother that her grandmother required repeated esophageal dilations.  Denies any change in bowel habits, though has been noticing intermittent BRBPR on the toilet paper usually, occasionally in the toilet as well.  Does have known hemorrhoids seen on last colonoscopy 06/2022.  Has not tried any OTC treatments for this.  Occasionally may  have some rectal discomfort but not normally.  Does not see blood with every bowel movement and denies any significant bleeding.  Denies any melena.  Previous GI Procedures/Imaging   EGD 06/17/2023 - Benign- appearing esophageal stenosis. Dilated.  - Esophageal mucosal changes suggestive of long- segment Barrett' s esophagus.  - Normal stomach.  - Normal examined duodenum.  - No specimens collected. Path:  1.  Surgical [P], distal esophagus :       REACTIVE SQUAMOUS MUCOSA       MINIMAL CHRONIC GASTRITIS       NEGATIVE FOR INTESTINAL METAPLASIA, DYSPLASIA AND CARCINOMA    Colonoscopy 06/24/2022 -  - Three 4 to 8 mm polyps in the transverse colon and in the ascending colon, removed with a cold snare. Resected and retrieved.  - Non-bleeding external and internal hemorrhoids.  - Anal papilla( e) were hypertrophied. - Recall 3 years Path: Surgical [P], colon, transverse and ascending, polyp (3) THREE FRAGMENTS OF TUBULAR ADENOMA. NEGATIVE FOR HIGH-GRADE DYSPLASIA.  Esophageal manometry 2021 Normal relaxation of the EG junction No significant esophageal peristaltic abnormality detected on this study  Past Medical History:  Diagnosis Date   Anginal pain    likely due to pleurisy   Anxiety    Biliary dyskinesia    Bulging lumbar disc    CAP (community acquired pneumonia)    Cardiac arrhythmia due to congenital heart disease    no arrhythmia identified    Congenital heart disease    Depression    Fatty liver 05/2016   Frequent headaches    GERD (gastroesophageal reflux disease)    Heart disease    Hypertension    Hypokalemia 07/19/2021   Hypomagnesemia 07/19/2021   Hypothyroidism    Kidney stones    Migraine    Obesity (BMI 35.0-39.9 without comorbidity)    PVC's (premature ventricular contractions)    Right upper quadrant abdominal pain 11/29/2018   Sigmoid diverticulosis    T2DM (type 2 diabetes mellitus) (HCC)     Past Surgical History:  Procedure Laterality Date   ABDOMINAL EXPLORATION SURGERY  1986   COLONOSCOPY  06/2016   CYSTOSCOPY WITH RETROGRADE PYELOGRAM, URETEROSCOPY AND STENT PLACEMENT Left 01/27/2023   Procedure: CYSTOSCOPY, LEFT URETEROSCOPY and stone extraction;  Surgeon: Renda Glance, MD;  Location: [facility];  Service: Urology;  Laterality: Left;   CYSTOSCOPY/URETEROSCOPY/HOLMIUM LASER/STENT PLACEMENT Right 05/16/2021   Procedure: CYSTOSCOPY/URETEROSCOPY/HOLMIUM  LASER/STENT PLACEMENT;  Surgeon: Selma Donnice SAUNDERS, MD;  Location: [facility];  Service: Urology;  Laterality: Right;   ESOPHAGEAL MANOMETRY N/A 12/06/2019   Procedure: ESOPHAGEAL MANOMETRY (EM);  Surgeon: Shila Gustav GAILS, MD;  Location: [facility];  Service: Endoscopy;  Laterality: N/A;   EXTRACORPOREAL SHOCK WAVE LITHOTRIPSY Left 12/03/2016   Procedure: LEFT EXTRACORPOREAL SHOCK WAVE LITHOTRIPSY (ESWL);  Surgeon: Watt Rush, MD;  Location: [facility];  Service: Urology;  Laterality: Left;   KIDNEY STONE SURGERY     LEFT HEART CATH AND CORONARY ANGIOGRAPHY  07/28/2016    Large tortuous coronary arteries no radiographic evidence of disease.  Not anginal chest pain.  Normal EF.   LITHOTRIPSY     NASAL SINUS SURGERY     ROTATOR CUFF REPAIR Right 12/27/2017   TRANSTHORACIC ECHOCARDIOGRAM  06/2017   EF 60-65%.  GR 1-2 DD.  Otherwise normal echo.  Normal valve function.  Normal wall motion.   TUBAL LIGATION      Current Outpatient Medications  Medication Sig Dispense Refill   Accu-Chek FastClix Lancets MISC USE 1 LANCET TO CHECK BLOOD  SUGAR THREE TIMES DAILY 300 each 1   ADVIL  200 MG tablet Take 200-600 mg by mouth every 6 (six) hours as needed for mild pain or headache.     albuterol  (PROAIR  HFA) 108 (90 Base) MCG/ACT inhaler Inhale 2 puffs into the lungs every 6 (six) hours as needed for wheezing or shortness of breath. 8 g 0   Aspirin -Caffeine (BAYER BACK & BODY PO) Take 1 tablet by mouth daily as needed (body pain).     baclofen (LIORESAL) 10 MG tablet Take 10 mg by mouth 2 (two) times daily as needed.     benzonatate  (TESSALON ) 100 MG capsule Take 1 capsule (100 mg total) by mouth 2 (two) times daily as needed for cough. 20 capsule 0   Blood Glucose Monitoring Suppl (ACCU-CHEK GUIDE) w/Device KIT by Does not apply route.     clotrimazole  (LOTRIMIN ) 1 % cream Apply to affected area 2 times daily 15 g 0   FLUoxetine  (PROZAC ) 40 MG capsule TAKE 1 CAPSULE BY MOUTH ONCE DAILY FOR ANXIETY 90 capsule 2    HYDROcodone -acetaminophen  (NORCO/VICODIN) 5-325 MG tablet Take 1 tablet by mouth.     hyoscyamine  (LEVSIN  SL) 0.125 MG SL tablet DISSOLVE 1 TABLET UNDER THE  TONGUE TWICE DAILY AS NEEDED FOR SPASMS 60 tablet 11   Lancets Misc. (ACCU-CHEK FASTCLIX LANCET) KIT Use as directed to test blood sugar daily 1 kit 0   levothyroxine  (SYNTHROID ) 75 MCG tablet TAKE 1 TABLET BY MOUTH IN THE MORNING ON  AN  EMPTY  STOMACH  WITH  WATER   ONLY  NO  FOOD  OR  OTHER  MEDICATIONS  FOR  30  MINUTES 90 tablet 2   linaclotide  (LINZESS ) 290 MCG CAPS capsule Take 1 capsule (290 mcg total) by mouth daily before breakfast. 90 capsule 3   metoprolol  tartrate (LOPRESSOR ) 25 MG tablet Take 1 tablet (25 mg total) by mouth 2 (two) times daily. 180 tablet 3   ondansetron  (ZOFRAN -ODT) 4 MG disintegrating tablet Take 1 tablet (4 mg total) by mouth every 8 (eight) hours as needed for nausea or vomiting. 20 tablet 0   oxybutynin  (DITROPAN -XL) 10 MG 24 hr tablet Take 10 mg by mouth at bedtime.     pantoprazole  (PROTONIX ) 40 MG tablet Take 1 tablet by mouth twice daily 180 tablet 0   rosuvastatin  (CRESTOR ) 20 MG tablet TAKE 1 TABLET BY MOUTH ONCE DAILY .  PLEASE  KEEP  UPCOMING  APPOINTMENT  ON  03/30/23  IN  ORDER  TO  RECEIVE  FUTURE  REFILLS 90 tablet 3   topiramate  (TOPAMAX ) 50 MG tablet Take 1 tablet (50 mg total) by mouth at bedtime. For headache prevention 90 tablet 0   traMADol  (ULTRAM ) 50 MG tablet Take 1-2 tablets (50-100 mg total) by mouth every 6 (six) hours as needed (pain). 8 tablet 0   No current facility-administered medications for this visit.    Allergies as of 12/01/2023 - Review Complete 12/01/2023  Allergen Reaction Noted   Latex Rash 11/27/2016   Penicillins Nausea Only 12/29/2017    Family History  Problem Relation Age of Onset   Gallstones Mother    COPD Father    Emphysema Father    Colon cancer Neg Hx    Stomach cancer Neg Hx    Esophageal cancer Neg Hx    Pancreatic cancer Neg Hx    Liver  disease Neg Hx    Colon polyps Neg Hx    Rectal cancer Neg Hx  Social History   Tobacco Use   Smoking status: Former    Current packs/day: 0.00    Types: Cigarettes    Quit date: 2008    Years since quitting: 17.7    Passive exposure: Past   Smokeless tobacco: Never   Tobacco comments:    used tobacco socially  Advertising account planner   Vaping status: Never Used  Substance Use Topics   Alcohol use: No    Alcohol/week: 0.0 standard drinks of alcohol   Drug use: No     Review of Systems:    Constitutional: No weight loss, fever, chills Cardiovascular: Positive chest pain Respiratory: No SOB or cough Gastrointestinal: See HPI and otherwise negative Hematologic: Intermittent BRBPR   Physical Exam:  Vital signs: BP 102/62   Pulse 69   Ht 5' 2 (1.575 m)   Wt 187 lb 8 oz (85 kg)   LMP 08/12/2015 Comment: patient states hx. tubal ligation  SpO2 96%   BMI 34.29 kg/m   Wt Readings from Last 3 Encounters:  12/01/23 187 lb 8 oz (85 kg)  11/09/23 188 lb 6 oz (85.4 kg)  09/17/23 189 lb (85.7 kg)    Constitutional: Pleasant, obese female in NAD, alert and cooperative Head:  Normocephalic and atraumatic.  Eyes: No scleral icterus.  Mouth: No oral lesions. Respiratory: Respirations even and unlabored. Lungs clear to auscultation bilaterally.  No wheezes, crackles, or rhonchi.  Cardiovascular:  Regular rate and rhythm. No murmurs. No peripheral edema.  Chest pain reproducible on palpation of sternum Gastrointestinal:  Soft, nondistended, mild epigastric tenderness to palpation. No rebound or guarding. Normal bowel sounds. No appreciable masses or hepatomegaly. Neurologic:  Alert and oriented x4;  grossly normal neurologically.  Skin:   Dry and intact without significant lesions or rashes. Psychiatric: Oriented to person, place and time. Demonstrates good judgement and reason without abnormal affect or behaviors.   RELEVANT LABS AND IMAGING: CBC    Component Value Date/Time    WBC 7.0 09/17/2023 1551   RBC 4.32 09/17/2023 1551   HGB 12.2 09/17/2023 1551   HGB 14.0 06/29/2017 0816   HCT 38.1 09/17/2023 1551   HCT 43.4 06/29/2017 0816   PLT 260 09/17/2023 1551   PLT 302 06/29/2017 0816   MCV 88.2 09/17/2023 1551   MCV 89 06/29/2017 0816   MCH 28.2 09/17/2023 1551   MCHC 32.0 09/17/2023 1551   RDW 14.0 09/17/2023 1551   RDW 14.2 06/29/2017 0816   LYMPHSABS 2.0 04/21/2023 1001   MONOABS 0.5 04/21/2023 1001   EOSABS 0.5 04/21/2023 1001   BASOSABS 0.1 04/21/2023 1001    CMP     Component Value Date/Time   NA 144 09/17/2023 1551   NA 142 04/01/2023 1602   K 4.3 09/17/2023 1551   CL 109 09/17/2023 1551   CO2 25 09/17/2023 1551   GLUCOSE 93 09/17/2023 1551   BUN 12 09/17/2023 1551   BUN 8 04/01/2023 1602   CREATININE 0.89 09/17/2023 1551   CALCIUM  8.9 09/17/2023 1551   PROT 6.4 06/01/2023 1518   PROT 6.7 04/12/2020 1141   ALBUMIN 4.1 06/01/2023 1518   ALBUMIN 4.3 04/12/2020 1141   AST 21 06/01/2023 1518   ALT 29 06/01/2023 1518   ALKPHOS 109 06/01/2023 1518   BILITOT 0.8 06/01/2023 1518   BILITOT 1.1 04/12/2020 1141   GFRNONAA >60 04/21/2023 1001   GFRAA >60 07/01/2018 1941   Echocardiogram 06/29/2017 - Left ventricle: The cavity size was normal. Wall thickness was    increased  in a pattern of mild LVH. Systolic function was normal.    The estimated ejection fraction was in the range of 60% to 65%.    Wall motion was normal; there were no regional wall motion    abnormalities. Diastolic dysfunction, grade indeterminate.    Indeterminate filling pressures.  - Aortic valve: There was trivial regurgitation.  - Tricuspid valve: There was mild regurgitation.  - Pulmonary arteries: PA peak pressure: 32 mm Hg (S).   Assessment/Plan:   GERD Esophageal dysphagia Chest pain Patient seen today for evaluation of worsening chest pain over the last 2 weeks, similar to what she had experienced previously prior to esophageal dilation.  Has history of  GERD for which she is currently taking Protonix  daily.  Taking hyoscyamine  for esophageal spasm/pain.  Has been experiencing severe pain in the center of her chest which can radiate to her neck, her shoulders, her back, her right lateral chest wall.  Symptoms often occur after eating but not always.  Feels that food often gets stuck in her chest and will not go up or down.  Describes the pain she experiences as sharp and squeezing.  Pain can occur at rest and outside of eating.  Was having severe pains yesterday and into last night which woke her up from sleep.  She had been advised by our office to go to the ER for worsening chest pain and she did think about calling 911 but did not go.  Currently rates pain as 3/10.  Was 7/10 this morning but improved after taking hyoscyamine . Reports pain feels similar to what she experienced in the past prior to EGD.  She had been seen in the ED earlier this year for similar pain which was determined not to be cardiac in nature.  Does follow with cardiology but has not been seen by them in a while. Coronary CT from earlier this year reviewed, mild nonobstructive CAD. Vital signs stable today.  Case and plan discussed with Dr. Nandigam in office today.   Will send urgent referral for patient to see her cardiologist and have further evaluation to rule out cardiac source of pain.  Advised her to go to the ED if chest pain worsens or if she develops any lightheadedness, dizziness, shortness of breath, etc.  If cleared from a cardiac standpoint, can proceed with EGD with repeat dilation.  - Urgent referral to cardiology - Go directly to the ED if worsening chest pain - If cleared from cardiac standpoint can plan for repeat EGD with dilation  - Switch PPI to Dexilant  60 mg daily  - Take hyoscyamine  on a schedule twice daily  Hemorrhoids BRBPR Patient reports intermittent BRBPR, ongoing for a while now.  Last colonoscopy 06/2022 with a few polyps seen as well as both  internal and external hemorrhoids, and 3-year recall recommended.  She is not seeing blood with every bowel movement, states there is no significant bleeding.  Occasionally may have some rectal discomfort but not often.  Has not tried anything OTC for hemorrhoids. Had a normal hemoglobin of 12.12 on 09/17/2023.  - Send Anusol  suppositories for hemorrhoids/BRBPR, use one at night for 3-5 days - Advised to let us  know if no improvement with suppositories - Set reminder to follow up   Camie Furbish, PA-C [facility] 12/01/2023, 8:54 AM  Patient Care Team: Gretta Comer POUR, NP as PCP - General (Nurse Practitioner) Anner Alm ORN, MD as PCP - Cardiology (Cardiology) Nicholaus Sherlyn CROME, NP as Nurse Practitioner

## 2023-12-02 ENCOUNTER — Encounter: Payer: Self-pay | Admitting: Cardiology

## 2023-12-02 ENCOUNTER — Telehealth: Payer: Self-pay | Admitting: Cardiology

## 2023-12-02 ENCOUNTER — Ambulatory Visit: Payer: Self-pay | Admitting: Cardiology

## 2023-12-02 ENCOUNTER — Ambulatory Visit: Attending: Cardiology | Admitting: Cardiology

## 2023-12-02 ENCOUNTER — Ambulatory Visit (HOSPITAL_COMMUNITY)
Admission: RE | Admit: 2023-12-02 | Discharge: 2023-12-02 | Disposition: A | Discharge status: Home / Self Care | Source: Ambulatory Visit | Attending: Cardiology | Admitting: Cardiology

## 2023-12-02 VITALS — BP 107/69 | HR 62 | Resp 16 | Ht 62.0 in | Wt 187.0 lb

## 2023-12-02 DIAGNOSIS — I493 Ventricular premature depolarization: Secondary | ICD-10-CM

## 2023-12-02 DIAGNOSIS — R072 Precordial pain: Secondary | ICD-10-CM | POA: Insufficient documentation

## 2023-12-02 DIAGNOSIS — E66811 Obesity, class 1: Secondary | ICD-10-CM

## 2023-12-02 DIAGNOSIS — I10 Essential (primary) hypertension: Secondary | ICD-10-CM | POA: Diagnosis not present

## 2023-12-02 DIAGNOSIS — Z6834 Body mass index (BMI) 34.0-34.9, adult: Secondary | ICD-10-CM

## 2023-12-02 DIAGNOSIS — Z01810 Encounter for preprocedural cardiovascular examination: Secondary | ICD-10-CM

## 2023-12-02 DIAGNOSIS — E6609 Other obesity due to excess calories: Secondary | ICD-10-CM

## 2023-12-02 DIAGNOSIS — E782 Mixed hyperlipidemia: Secondary | ICD-10-CM

## 2023-12-02 LAB — ECHOCARDIOGRAM COMPLETE
AR max vel: 2.23 cm2
AV Area VTI: 2.64 cm2
AV Area mean vel: 2.79 cm2
AV Mean grad: 3 mmHg
AV Peak grad: 7 mmHg
Ao pk vel: 1.32 m/s
Area-P 1/2: 2.19 cm2
Est EF: >75
Height: 62 in
S' Lateral: 2.58 cm

## 2023-12-02 NOTE — Progress Notes (Signed)
 Cardiology Office Note:  .   Date:  12/02/2023  ID:  Abigail [name] Asal, DOB 02-Dec-1965, MRN [id number] PCP:  Gretta Comer POUR, NP  Cardiologist:  Alm Clay, MD  Click to update primary MD,subspecialty MD or APP then REFRESH:1}    Chief Complaint  Patient presents with   Chest Pain   Follow-up    DOD visit.     History of Present Illness: .   Abigail [name] is a 58 y.o. Caucasian female whose past medical history and cardiovascular risk factors includes: Minimal CAC, mild nonobstructive CAD, history of esophageal spasms status post esophageal dilatation, former smoker, hypertension, hyperlipidemia, premature ventricular contractions.  Patient is being seen as a sick visit work in for chest pain evaluation with DOD.  Patient follows up with Dr. Clay on a longitudinal basis and was last seen in the office January 2025, progress note reviewed.  During her last office visit in January 2025 the shared decision between the patient and the provider was to proceed forward with coronary CTA for ischemic evaluation given her chest pain and EKG.  She was also recommended to implement lifestyle changes as well as medication compliance for improving her risk factors.  She presents today for evaluation of chest pain.  In April 2025 patient was diagnosed with esophageal stenosis via an upper endoscopy and underwent esophageal dilatation.  She was doing well status post intervention and with pharmacological therapy.  However recently she has been experiencing chest pain predominantly with swallowing and feels that the spasm has resurfaced.  She describes chest pain occurring all day, not always brought on by effort related activities, does not resolve with rest.  At times associated with shortness of breath.  She describes the discomfort as biting a hamburger in a hurry and after swallowing it it feels like it will not go down all the way.  Denies heart failure symptoms.  Patient  states that she was asked by gastroenterology to obtain cardiology clearance prior to endoscopy.  Review of Systems: .   Review of Systems  Cardiovascular:  Positive for chest pain (see HPI). Negative for claudication, irregular heartbeat, leg swelling, near-syncope, orthopnea, palpitations, paroxysmal nocturnal dyspnea and syncope.  Respiratory:  Positive for shortness of breath.   Hematologic/Lymphatic: Negative for bleeding problem.    Studies Reviewed:   EKG: EKG Interpretation Date/Time:  Thursday December 02 2023 09:52:54 EDT Ventricular Rate:  62 PR Interval:  162 QRS Duration:  68 QT Interval:  440 QTC Calculation: 446 R Axis:   -2  Text Interpretation: Normal sinus rhythm Low voltage QRS Nonspecific ST and T wave abnormality Cannot rule out Anterior infarct (cited on or before 23-Oct-2022) When compared with ECG of 21-Apr-2023 10:01, No significant change was found Confirmed by Michele Richardson [phone]) on 12/02/2023 10:07:07 AM  Echocardiogram: ECHO: Normal LV size and function.  EF 60 to 65%.  No RWMA.  Unable to assess diastolic function.  Trivial AI and TR.  Peak PA pressures estimated 30 to mmHg.  CCTA: 04/2023 1. Coronary calcium  score of 3.66. This was 71st percentile for age-, sex, and race-matched controls.   2. Total plaque volume 48 mm3 which is 51st percentile for age- and sex-matched controls (calcified plaque 1 mm3; non-calcified plaque47 mm3). TPV is mild.   3. Normal coronary origin with right dominance.   4. Mild non-calcified plaque (25-49%) in the mid LAD.   RECOMMENDATIONS: 1. CAD-RADS 2: Mild non-obstructive CAD (25-49%). Consider non-atherosclerotic causes of chest pain. Consider preventive therapy and  risk factor modification. Radiology overread: No significant extracardiac findings within the visualized chest.  Risk Assessment/Calculations:   NA   Labs:       Latest Ref Rng & Units 09/17/2023    3:51 PM 04/21/2023   10:01 AM 01/27/2023     3:19 PM  CBC  WBC 3.8 - 10.8 Thousand/uL 7.0  8.0    Hemoglobin 11.7 - 15.5 g/dL 87.7  86.4  87.3   Hematocrit 35.0 - 45.0 % 38.1  40.9  37.0   Platelets 140 - 400 Thousand/uL 260  283         Latest Ref Rng & Units 09/17/2023    3:51 PM 06/01/2023    3:18 PM 04/21/2023   10:01 AM  BMP  Glucose 65 - 99 mg/dL 93  87  897   BUN 7 - 25 mg/dL 12  18  8    Creatinine 0.50 - 1.03 mg/dL 9.10  9.33  9.33   BUN/Creat Ratio 6 - 22 (calc) SEE NOTE:     Sodium 135 - 146 mmol/L 144  138  139   Potassium 3.5 - 5.3 mmol/L 4.3  4.1  3.7   Chloride 98 - 110 mmol/L 109  101  100   CO2 20 - 32 mmol/L 25  29  22    Calcium  8.6 - 10.4 mg/dL 8.9  9.0  9.6       Latest Ref Rng & Units 09/17/2023    3:51 PM 06/01/2023    3:18 PM 04/21/2023   10:01 AM  CMP  Glucose 65 - 99 mg/dL 93  87  897   BUN 7 - 25 mg/dL 12  18  8    Creatinine 0.50 - 1.03 mg/dL 9.10  9.33  9.33   Sodium 135 - 146 mmol/L 144  138  139   Potassium 3.5 - 5.3 mmol/L 4.3  4.1  3.7   Chloride 98 - 110 mmol/L 109  101  100   CO2 20 - 32 mmol/L 25  29  22    Calcium  8.6 - 10.4 mg/dL 8.9  9.0  9.6   Total Protein 6.0 - 8.3 g/dL  6.4  6.5   Total Bilirubin 0.2 - 1.2 mg/dL  0.8  1.1   Alkaline Phos 39 - 117 U/L  109  85   AST 0 - 37 U/L  21  25   ALT 0 - 35 U/L  29  30     Lab Results  Component Value Date   CHOL 193 06/01/2023   HDL 32.00 (L) 06/01/2023   LDLCALC 109 (H) 06/01/2023   TRIG 262.0 (H) 06/01/2023   CHOLHDL 6 06/01/2023   No results for input(s): LIPOA in the last 8760 hours. No components found for: NTPROBNP No results for input(s): PROBNP in the last 8760 hours. Recent Labs    06/01/23 1518 09/17/23 1551  TSH 2.26 2.81    Physical Exam:    Today's Vitals   12/02/23 0950  BP: 107/69  Pulse: 62  Resp: 16  SpO2: 94%  Weight: 187 lb (84.8 kg)  Height: 5' 2 (1.575 m)   Body mass index is 34.2 kg/m. Wt Readings from Last 3 Encounters:  12/02/23 187 lb (84.8 kg)  12/01/23 187 lb 8 oz (85 kg)   11/09/23 188 lb 6 oz (85.4 kg)    Physical Exam  Constitutional: No distress.  hemodynamically stable  Neck: No JVD present.  Cardiovascular: Normal rate, regular rhythm, S1 normal and S2 normal.  Exam reveals no gallop, no S3 and no S4.  No murmur heard. Pulmonary/Chest: Effort normal and breath sounds normal. No stridor. She has no wheezes. She has no rales.  Musculoskeletal:        General: No edema.     Cervical back: Neck supple.  Skin: Skin is warm.     Impression & Recommendation(s):  Impression:   ICD-10-CM   1. Precordial pain  R07.2 EKG 12-Lead    ECHOCARDIOGRAM COMPLETE    2. Preprocedural cardiovascular examination  Z01.810     3. Benign hypertension  I10     4. Mixed hyperlipidemia  E78.2     5. PVC's (premature ventricular contractions)  I49.3     6. Class 1 obesity due to excess calories without serious comorbidity with body mass index (BMI) of 34.0 to 34.9 in adult  E66.811    E66.09    Z68.34        Recommendation(s):  Precordial pain Preprocedural cardiovascular examination Patient was referred to the practice by GI for evaluation of chest pain prior to considering endoscopy Patient started having symptoms of esophageal spasm this past Tuesday Her chest pain is predominantly noncardiac EKG today illustrates sinus rhythm with ST-T changes similar to prior tracing She had a coronary CTA in February 2025 which noted minimal CAC, mild plaque burden, and mild nonobstructive disease. Prior to risk stratifying her recommended echocardiogram to reevaluate LVEF and regional wall motion abnormalities.  Patient was able to get the echo after the office visit today which noted hyperdynamic LV and no regional wall motion abnormalities.  No significant valvular heart disease. Patient is low/acceptable candidate for upcoming GI endoscopy for further evaluation of esophageal spasm.  Esophageal stenosis status post esophageal dilatation Follows with  gastroenterology  Benign hypertension Office blood pressures are soft. Continue Lopressor  25 mg p.o. twice daily  Mixed hyperlipidemia Continue Crestor  20 mg p.o. daily  PVC's (premature ventricular contractions) EKG illustrates no ectopy Continue AV nodal blocking agents  Class 1 obesity due to excess calories without serious comorbidity with body mass index (BMI) of 34.0 to 34.9 in adult Body mass index is 34.2 kg/m. I reviewed with her importance of diet, regular physical activity/exercise, weight loss.   Patient is educated on the importance of increasing physical activity gradually as tolerated with a goal of moderate intensity exercise for 30 minutes a day 5 days a week.  Orders Placed:  Orders Placed This Encounter  Procedures   EKG 12-Lead   ECHOCARDIOGRAM COMPLETE    Standing Status:   Future    Number of Occurrences:   1    Expiration Date:   12/01/2024    Where [facility] this test [facility] performed[facility]    Does the patient weigh less than or greater than 250 lbs?:   Patient weighs less than 250 lbs    Perflutren DEFINITY (image enhancing agent) should be administered unless hypersensitivity or allergy exist:   Administer Perflutren    Reason for exam-Echo:   Preoperative evaluation    Reason for exam-Echo:   Other-Full Diagnosis List    Full ICD-10/Reason for Exam:   Precordial chest pain [813930]     Final Medication List:   No orders of the defined types were placed in this encounter.   There are no discontinued medications.   Current Outpatient Medications:    Accu-Chek FastClix Lancets MISC, USE 1 LANCET TO CHECK BLOOD SUGAR THREE TIMES DAILY, Disp: 300 each, Rfl: 1  ADVIL  200 MG tablet, Take 200-600 mg by mouth every 6 (six) hours as needed for mild pain or headache., Disp: , Rfl:    albuterol  (PROAIR  HFA) 108 (90 Base) MCG/ACT inhaler, Inhale 2 puffs into the lungs every 6 (six) hours as needed for wheezing or shortness of breath., Disp: 8 g,  Rfl: 0   Aspirin -Caffeine (BAYER BACK & BODY PO), Take 1 tablet by mouth daily as needed (body pain)., Disp: , Rfl:    baclofen (LIORESAL) 10 MG tablet, Take 10 mg by mouth 2 (two) times daily as needed., Disp: , Rfl:    benzonatate  (TESSALON ) 100 MG capsule, Take 1 capsule (100 mg total) by mouth 2 (two) times daily as needed for cough., Disp: 20 capsule, Rfl: 0   Blood Glucose Monitoring Suppl (ACCU-CHEK GUIDE) w/Device KIT, by Does not apply route., Disp: , Rfl:    clotrimazole  (LOTRIMIN ) 1 % cream, Apply to affected area 2 times daily, Disp: 15 g, Rfl: 0   dexlansoprazole  (DEXILANT ) 60 MG capsule, Take 1 capsule (60 mg total) by mouth daily., Disp: 30 capsule, Rfl: 3   FLUoxetine  (PROZAC ) 40 MG capsule, TAKE 1 CAPSULE BY MOUTH ONCE DAILY FOR ANXIETY, Disp: 90 capsule, Rfl: 2   HYDROcodone -acetaminophen  (NORCO/VICODIN) 5-325 MG tablet, Take 1 tablet by mouth., Disp: , Rfl:    hydrocortisone  (ANUSOL -HC) 25 MG suppository, Place 1 suppository (25 mg total) rectally every 12 (twelve) hours., Disp: 12 suppository, Rfl: 1   hyoscyamine  (LEVSIN  SL) 0.125 MG SL tablet, DISSOLVE 1 TABLET UNDER THE  TONGUE TWICE DAILY AS NEEDED FOR SPASMS, Disp: 60 tablet, Rfl: 11   Lancets Misc. (ACCU-CHEK FASTCLIX LANCET) KIT, Use as directed to test blood sugar daily, Disp: 1 kit, Rfl: 0   levothyroxine  (SYNTHROID ) 75 MCG tablet, TAKE 1 TABLET BY MOUTH IN THE MORNING ON  AN  EMPTY  STOMACH  WITH  WATER   ONLY  NO  FOOD  OR  OTHER  MEDICATIONS  FOR  30  MINUTES, Disp: 90 tablet, Rfl: 2   linaclotide  (LINZESS ) 290 MCG CAPS capsule, Take 1 capsule (290 mcg total) by mouth daily before breakfast., Disp: 90 capsule, Rfl: 3   metoprolol  tartrate (LOPRESSOR ) 25 MG tablet, Take 1 tablet (25 mg total) by mouth 2 (two) times daily., Disp: 180 tablet, Rfl: 3   ondansetron  (ZOFRAN -ODT) 4 MG disintegrating tablet, Take 1 tablet (4 mg total) by mouth every 8 (eight) hours as needed for nausea or vomiting., Disp: 20 tablet, Rfl: 0    oxybutynin  (DITROPAN -XL) 10 MG 24 hr tablet, Take 10 mg by mouth at bedtime., Disp: , Rfl:    pantoprazole  (PROTONIX ) 40 MG tablet, Take 1 tablet by mouth twice daily, Disp: 180 tablet, Rfl: 0   rosuvastatin  (CRESTOR ) 20 MG tablet, TAKE 1 TABLET BY MOUTH ONCE DAILY .  PLEASE  KEEP  UPCOMING  APPOINTMENT  ON  03/30/23  IN  ORDER  TO  RECEIVE  FUTURE  REFILLS, Disp: 90 tablet, Rfl: 3   topiramate  (TOPAMAX ) 50 MG tablet, Take 1 tablet (50 mg total) by mouth at bedtime. For headache prevention, Disp: 90 tablet, Rfl: 0   traMADol  (ULTRAM ) 50 MG tablet, Take 1-2 tablets (50-100 mg total) by mouth every 6 (six) hours as needed (pain)., Disp: 8 tablet, Rfl: 0  Consent:   NA  Disposition:   3 month follow up w/ Dr. Anner.   Her questions and concerns were addressed to her satisfaction. She voices understanding of the recommendations provided during this encounter.  Signed, Madonna Michele HAS, [facility]  A Division of [facility] [address] [place], [place] [address]  12/02/2023 7:20 PM

## 2023-12-02 NOTE — Telephone Encounter (Signed)
 Pt c/o of Chest Pain: STAT if active (IN THIS MOMENT) CP, including tightness, pressure, jaw pain, shoulder/upper arm/back pain, SOB, nausea, and vomiting.  1. Are you having CP right now (tightness, pressure, or discomfort)?  Yes  2. Are you experiencing any other symptoms (ex. SOB, nausea, vomiting, sweating)?  Feels the pain between shoulder blades, neck pain, numbness in hands/arms   3. How long have you been experiencing CP?  A couple of days.  4. Is your CP continuous or coming and going?  Continuous  5. Have you taken Nitroglycerin ?  No ?   Patient mentions that gastroenterologist sent stat EKG results to the office yesterday.

## 2023-12-02 NOTE — Telephone Encounter (Signed)
 Spoke with pt and she said the CP she is having is intermittent. It feels like a tightening pain and then it resolves. Pt states she feels it in her throat as well and describes it as if you are eating a hamburger really fast and you swallow it but it feels like it won't go down all of the way. Pt states she has had this happen before and it was her esophageal spasms and because of that, she did not want to go to ED for this. Pt did say her GI sent over an EKG to look at yesterday but it doesn't appear to be in her chart. Pt is scheduled for DOD visit with Dr. Michele today 9/25 at 10 AM to be evaluated.

## 2023-12-02 NOTE — Patient Instructions (Signed)
 Medication Instructions:  NO CHANGES  *If you need a refill on your cardiac medications before your next appointment, please call your pharmacy*   Lab Work:  NOT NEEDED   Testing/Procedures:  SCHEDULE AS SOON AS POSSIBLE NEEDED FOR PRE OP  Your physician has requested that you have an echocardiogram. Echocardiography is a painless test that uses sound waves to create images of your heart. It provides your doctor with information about the size and shape of your heart and how well your heart's chambers and valves are working. This procedure takes approximately one hour. There are no restrictions for this procedure. Please do NOT wear cologne, perfume, aftershave, or lotions (deodorant is allowed). Please arrive 15 minutes prior to your appointment time.  Please note: We ask at that you not bring children with you during ultrasound (echo/ vascular) testing. Due to room size and safety concerns, children are not allowed in the ultrasound rooms during exams. Our front office staff cannot provide observation of children in our lobby area while testing is being conducted. An adult accompanying a patient to their appointment will only be allowed in the ultrasound room at the discretion of the ultrasound technician under special circumstances. We apologize for any inconvenience.    Follow-Up: At [facility], you and your health needs are our priority.  As part of our continuing mission to provide you with exceptional heart care, we have created designated Provider Care Teams.  These Care Teams include your primary Cardiologist (physician) and Advanced Practice Providers (APPs -  Physician Assistants and Nurse Practitioners) who all work together to provide you with the care you need, when you need it.     Your next appointment:   3 month(s)  The format for your next appointment:   In Person  Provider:   Alm Clay, MD   Other Instructions

## 2023-12-06 ENCOUNTER — Telehealth: Payer: Self-pay | Admitting: Gastroenterology

## 2023-12-06 NOTE — Telephone Encounter (Signed)
 The pt has been advised and states that she would prefer to wait and see how she does and call back if she wants to proceed with EGD. She has not rectal bleeding and feels good at this time.

## 2023-12-06 NOTE — Telephone Encounter (Signed)
 The pt will call and make appt as she is able.

## 2023-12-06 NOTE — Telephone Encounter (Signed)
 Patient has been evaluated by cardiology and has been cleared for endoscopy.  Please call patient to schedule EGD with dilation in the [facility] with Dr. Shila. Please also ask about rectal bleeding and whether she has seen improvement with suppositories.  Thank you

## 2023-12-07 ENCOUNTER — Ambulatory Visit: Admitting: Gastroenterology

## 2023-12-07 NOTE — Telephone Encounter (Signed)
 Niels,  The plan for this pt was originally to schedule an EGD after here appointment with cardiology. However, when the pt was called yesterday, to schedule the procedure, she stated she wanted to wait a while and see how she felt. No cardiac clearance is needed at this time.    Thank you,  Daphne

## 2023-12-08 ENCOUNTER — Telehealth (HOSPITAL_BASED_OUTPATIENT_CLINIC_OR_DEPARTMENT_OTHER): Payer: Self-pay | Admitting: *Deleted

## 2023-12-08 NOTE — Progress Notes (Signed)
 See tele note 12/08/23 advice only clearance not needed pt not having procedure.

## 2023-12-08 NOTE — Progress Notes (Signed)
 Called patient to see if maybe she can let me know where to send these results and get requesting office information. NA, left message on VM

## 2023-12-08 NOTE — Telephone Encounter (Signed)
 Reached out to the GI office RN Odetta Curly to inquire if they were needing cardiac clearance. Looks like pt was just seen by Dr. Michele for preop clearance but I did not see that our office received a request. Per Odetta Curly, RN pt is not having procedure at this time, no need for cardiac clearance.

## 2023-12-30 ENCOUNTER — Other Ambulatory Visit: Payer: Self-pay | Admitting: Primary Care

## 2023-12-30 DIAGNOSIS — G44329 Chronic post-traumatic headache, not intractable: Secondary | ICD-10-CM

## 2023-12-31 ENCOUNTER — Other Ambulatory Visit: Payer: Self-pay | Admitting: Gastroenterology

## 2024-01-26 LAB — HM MAMMOGRAPHY

## 2024-01-27 ENCOUNTER — Encounter: Payer: Self-pay | Admitting: Primary Care

## 2024-02-15 ENCOUNTER — Encounter: Payer: Self-pay | Admitting: Cardiology

## 2024-02-15 ENCOUNTER — Ambulatory Visit: Attending: Cardiology | Admitting: Cardiology

## 2024-02-15 VITALS — BP 136/80 | HR 73 | Ht 62.0 in | Wt 188.6 lb

## 2024-02-15 MED ORDER — METOPROLOL TARTRATE 50 MG PO TABS: 50.0000 mg | ORAL_TABLET | Freq: Two times a day (BID) | ORAL | 3 refills | Status: AC

## 2024-02-15 NOTE — Patient Instructions (Addendum)
 Medication Instructions:   Increase Metoprolol  tartrate 50 mg  twice  a day   *If you need a refill on your cardiac medications before your next appointment, please call your pharmacy*   Lab Work: Not needed If you have labs (blood work) drawn today and your tests are completely normal, you will receive your results only by: MyChart Message (if you have MyChart) OR A paper copy in the mail If you have any lab test that is abnormal or we need to change your treatment, we will call you to review the results.   Testing/Procedures:  Not needed  Follow-Up: At [facility], you and your health needs are our priority.  As part of our continuing mission to provide you with exceptional heart care, we have created designated Provider Care Teams.  These Care Teams include your primary Cardiologist (physician) and Advanced Practice Providers (APPs -  Physician Assistants and Nurse Practitioners) who all work together to provide you with the care you need, when you need it.     Your next appointment:   12 month(s)  The format for your next appointment:   In Person  Provider:   Alm Clay, MD

## 2024-02-15 NOTE — Progress Notes (Unsigned)
 Cardiology Office Note:  .   Date:  02/19/2024  ID:  Abigail [name], DOB 09-May-1965, MRN [id number] PCP: Abigail Comer POUR, NP  [facility] Providers Cardiologist:  Abigail Clay, MD     Chief Complaint  Patient presents with   Follow-up    Recently evaluated with echocardiogram.  He was having chest pain.  Had pending surgery/esophageal dilation.    Patient Profile: .     Abigail [name] is an obese 58 y.o. female former smoker with a PMH notable for HTN, HLD and palpitations (likely symptomatic PVCs) who presents here for 6-week follow-up after DOD visit with Dr. Michele (and essentially amounts to an annual follow-up with me).  I last saw Abigail [name] on March 30, 2023 with complaints of precordial pain.  She is noticing a sensation of closure and tightness in her chest occurring intermittently for about a month both at rest and exertion.  Mostly precordial and sometimes wake her from sleep.  Also associated with palpitations.  For clarification purposes I chose to go and proceed with a Coronary CTA to delineate her coronary anatomy since partners regarding catheterization in 2012 showed minimal disease. => .  She was seen by Dr. Madonna Abigail [name] as a DOD worki-n visit on 12/02/2023 for an episode of chest pain.  Indicated that she had been evaluated esophageal spasm back in 2025 and had endoscopy with esophageal dilation.  Apparently was doing relatively well with pharmacotherapy.  Unfortunately she had started noticing again chest pain predominantly with swallowing and felt better spasm resurface.  Symptoms noted lasting all day at times associate with dyspnea.  Described as body hammer hurting a after swallowing it feels like it would knock her down.  No other heart symptoms.  Felt to be low risk for upcoming GI procedure but did risk ratified with an echocardiogram.    Subjective  Discussed the use of AI scribe software for clinical note transcription with the  patient, who gave verbal consent to proceed.  History of Present Illness Abigail [name] is a 58 year old female with hyperdynamic left ventricle who presents for follow-up of her cardiac condition.  She has a history of esophageal spasms and underwent a procedure in April. Despite a recommendation for another procedure, she declined as she felt her condition had stabilized.  An echocardiogram on December 02, 2023, showed a hyperdynamic left ventricle with an ejection fraction greater than 75%, mild left ventricular hypertrophy, and mild diastolic dysfunction, grade one. She is currently on metoprolol , taking 25 mg twice daily, for palpitations.  She has been on rosuvastatin  20 mg once daily for cholesterol management. Her lipid panel in August showed an LDL of 109, which was higher than previous levels. She has mild plaque. No changes have been made to her rosuvastatin  regimen since it was prescribed.  In December 2023, a CT scan was performed to check for pulmonary embolism, which was negative. However, it revealed stable benign subcentimeter pulmonary nodules. Her family history includes lung cancer and emphysema in her biological father.  She experienced a fall at work in July, which led to headaches and an MRI of the brain. The MRI was part of a workup to rule out any significant issues following the fall.  She reports occasional chest pain and cramps between her elbow and shoulder. Her blood pressure was noted to be 136/80, which she attributes to work stress.  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 She has made dietary changes, including consuming turkey products and rotisserie chicken, and incorporating cauliflower rice and homemade chicken soup into her meals. She is also taking supplements to support her health.   Objective   CV Medications - Metoprolol  25 mg twice a day - Rosuvastatin  20 mg once a  day  Other medications include Synthroid  25 mg daily, Linzess  200 mg every morning, Prozac  40 Miller daily, Ditropan  10 mg nightly, Topamax  50 mg nightly for headaches; Protonix  40 mg twice daily PRNs include 50 mg Ultram  1 to 2 tablets every 6 hours as needed for pain; Levsin  0.25 mg tab twice daily as needed spasm, Anusol  suppository as needed, baclofen 10 mg up to 2 times per day as needed, Zofran  as needed nausea,  Studies Reviewed: SABRA        Lab Results  Component Value Date   CHOL 193 06/01/2023   HDL 32.00 (L) 06/01/2023   LDLCALC 109 (H) 06/01/2023   TRIG 262.0 (H) 06/01/2023   CHOLHDL 6 06/01/2023    Lab Results  Component Value Date   HGBA1C 5.7 06/01/2023   Lab Results  Component Value Date   NA 144 09/17/2023   K 4.3 09/17/2023   CREATININE 0.89 09/17/2023   EGFR 75 09/17/2023   GLUCOSE 93 09/17/2023   Results LABS Lipid Panel: LDL 109 (August 2025)  RADIOLOGY Coronary CT Angiogram: Coronary Score 3.66.  Total plaque volume 48.  51st percentile.  Mild noncalcified plaque in the mid LAD of 25 to 49%.  Stable benign subcentimeter pulmonary nodules, no enlarged lymph nodes, no pulmonary embolus (April 09, 2023)  DIAGNOSTIC Echocardiogram: Hyperdynamic left ventricle with EF>75%, mild left ventricular hypertrophy (LVH), GR 1 DD (12/02/2023)   Risk Assessment/Calculations:         Physical Exam:   VS:  BP 136/80   Pulse 73   Ht 5' 2 (1.575 m)   Wt 188 lb 9.6 oz (85.5 kg)   LMP 08/12/2015 Comment: patient states hx. tubal ligation  SpO2 96%   BMI 34.50 kg/m    Wt Readings from Last 3 Encounters:  02/15/24 188 lb 9.6 oz (85.5 kg)  12/02/23 187 lb (84.8 kg)  12/01/23 187 lb 8 oz (85 kg)     GEN: Well nourished, well groomed; in no acute distress; mild to moderately obese NECK: No JVD; No carotid bruits CARDIAC: Normal S1, S2; RRR, no murmurs, rubs, gallops RESPIRATORY:  Clear to auscultation without rales, wheezing or rhonchi ; nonlabored, good air  movement. ABDOMEN: Soft, non-tender, non-distended EXTREMITIES:  No edema; No deformity      ASSESSMENT AND PLAN: .    Problem List Items Addressed This Visit       Cardiology Problems   Hyperlipidemia associated with type 2 diabetes mellitus (HCC) (Chronic)   Cholesterol levels increased from March 2024 to March 2025. LDL 109 mg/dL, slightly above target <100 mg/dL. No significant coronary artery disease on recent CT, mild plaque present. - Continue rosuvastatin  20 mg once daily. - Recheck lipid panel in spring to assess need for dose adjustment.  Most recent A1c was 5.7.  No longer on any diabetes medications.      Relevant Medications   metoprolol  tartrate (LOPRESSOR ) 50 MG tablet   Mild CAD (Chronic)   Really mild disease noted on recent Coronary CTA. Was told to resume rosuvastatin  with PCP visit in March.  Labs at that time are indicative of LDL 109 having stopped statin.. Now back on statin and  increased dose of beta-blocker.  No need for aspirin .      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  Based on echocardiogram showing hyperdynamic function, we will increase beta-blocker dose to help with diastolic function and improving PVCs. -Increase Lopressor  (metoprolol  to tartrate) to 50 mg twice daily      Relevant Medications   metoprolol  tartrate (LOPRESSOR ) 50 MG tablet     Other   Diastolic dysfunction without heart failure   Left ventricular hypertrophy with mild diastolic dysfunction and hyperdynamic left ventricular function Echocardiogram shows hyperdynamic left ventricular function with ejection fraction >75%, mild left ventricular hypertrophy, and mild diastolic dysfunction (grade 1). Hyperdynamic function not concerning if blood pressure controlled and asymptomatic. Mild diastolic dysfunction age-appropriate. Current metoprolol   dose subtherapeutic. - Increased metoprolol  to 50 mg twice daily to manage palpitations and hyperdynamic function.      Moderate obesity (Chronic)   Encouraged continued activity exercise.  Hoping to try to lose weight which will help blood pressure and lipids.      Palpitations (Chronic)   Intermittent episodes of palpitations and fast heart spells, skipped beats.  Will increase Lopressor  to 50 mg twice daily.            Follow-Up: Return in about 1 year (around 02/14/2025) for 1 Yr Follow-up.  I spent 42 minutes in the care of Bessye A Koc today including reviewing labs (2 minutes), reviewing studies (3 minutes), face to face time discussing treatment options (23), reviewing records from notes from GI medicine and DOD note (6 minutes), 9 minutes dictating, and documenting in the encounter.      Signed, Abigail MICAEL Clay, MD, MS Abigail [name], M.D., M.S. Interventional Cardiologist  [facility] Pager # [phone]

## 2024-02-19 ENCOUNTER — Encounter: Payer: Self-pay | Admitting: Cardiology

## 2024-02-19 DIAGNOSIS — I5189 Other ill-defined heart diseases: Secondary | ICD-10-CM | POA: Insufficient documentation

## 2024-02-19 NOTE — Assessment & Plan Note (Signed)
 She still has palpitations off and on.  Based on echocardiogram showing hyperdynamic function, we will increase beta-blocker dose to help with diastolic function and improving PVCs. -Increase Lopressor  (metoprolol  to tartrate) to 50 mg twice daily

## 2024-02-19 NOTE — Assessment & Plan Note (Signed)
 Left ventricular hypertrophy with mild diastolic dysfunction and hyperdynamic left ventricular function Echocardiogram shows hyperdynamic left ventricular function with ejection fraction >75%, mild left ventricular hypertrophy, and mild diastolic dysfunction (grade 1). Hyperdynamic function not concerning if blood pressure controlled and asymptomatic. Mild diastolic dysfunction age-appropriate. Current metoprolol  dose subtherapeutic. - Increased metoprolol  to 50 mg twice daily to manage palpitations and hyperdynamic function.

## 2024-02-19 NOTE — Assessment & Plan Note (Signed)
 Really mild disease noted on recent Coronary CTA. Was told to resume rosuvastatin  with PCP visit in March.  Labs at that time are indicative of LDL 109 having stopped statin.. Now back on statin and increased dose of beta-blocker.  No need for aspirin .

## 2024-02-19 NOTE — Assessment & Plan Note (Signed)
 Cholesterol levels increased from March 2024 to March 2025. LDL 109 mg/dL, slightly above target <100 mg/dL. No significant coronary artery disease on recent CT, mild plaque present. - Continue rosuvastatin  20 mg once daily. - Recheck lipid panel in spring to assess need for dose adjustment.  Most recent A1c was 5.7.  No longer on any diabetes medications.

## 2024-02-19 NOTE — Assessment & Plan Note (Signed)
 Intermittent episodes of palpitations and fast heart spells, skipped beats.  Will increase Lopressor  to 50 mg twice daily.

## 2024-02-19 NOTE — Assessment & Plan Note (Signed)
 Encouraged continued activity exercise.  Hoping to try to lose weight which will help blood pressure and lipids.

## 2024-03-12 ENCOUNTER — Other Ambulatory Visit: Payer: Self-pay | Admitting: Primary Care

## 2024-03-12 ENCOUNTER — Other Ambulatory Visit: Payer: Self-pay | Admitting: Cardiology

## 2024-03-12 DIAGNOSIS — E039 Hypothyroidism, unspecified: Secondary | ICD-10-CM

## 2024-03-12 NOTE — Telephone Encounter (Signed)
Patient is due for CPE/follow up in early April, this will be required prior to any further refills.  Please schedule, thank you!   

## 2024-03-20 ENCOUNTER — Encounter: Payer: Self-pay | Admitting: Neurology

## 2024-03-20 ENCOUNTER — Ambulatory Visit: Admitting: Neurology

## 2024-03-31 ENCOUNTER — Other Ambulatory Visit: Payer: Self-pay | Admitting: Gastroenterology

## 2024-03-31 ENCOUNTER — Other Ambulatory Visit: Payer: Self-pay | Admitting: Primary Care

## 2024-03-31 DIAGNOSIS — F32A Depression, unspecified: Secondary | ICD-10-CM

## 2024-03-31 NOTE — Telephone Encounter (Signed)
 I have left Eldred a message to call me back. My coworker has already refilled her pantoprazole  today and I see Camie Furbish, PA switched her to Dexilant  last September. I asked her to call me back and let me know which one is she on. Shouldn't be both.

## 2024-03-31 NOTE — Telephone Encounter (Signed)
 Spoke with Abigail [name] and she has been taking mostly the pantoprazole  not the Dexilant . She said with that and a new supplement of Power 3 pills which contain peppermint she is doing well. She states she has changed her diet also. She is overdue for follow up. Dr Shila doesn't have any open spots. She is going to call back in several weeks and see what has opened up.

## 2024-03-31 NOTE — Telephone Encounter (Signed)
 Pt returned call for PJ. Requesting a call back. Please advise. Thank you.

## 2024-04-01 ENCOUNTER — Other Ambulatory Visit: Payer: Self-pay | Admitting: Gastroenterology

## 2024-04-13 IMAGING — CR DG CHEST 2V
2 series · 2 of 2 positions shown · non-contrast
Comparison: October 19, 2020

CLINICAL DATA: Status post tick bite.

EXAM:
CHEST - 2 VIEW

[chest pa]
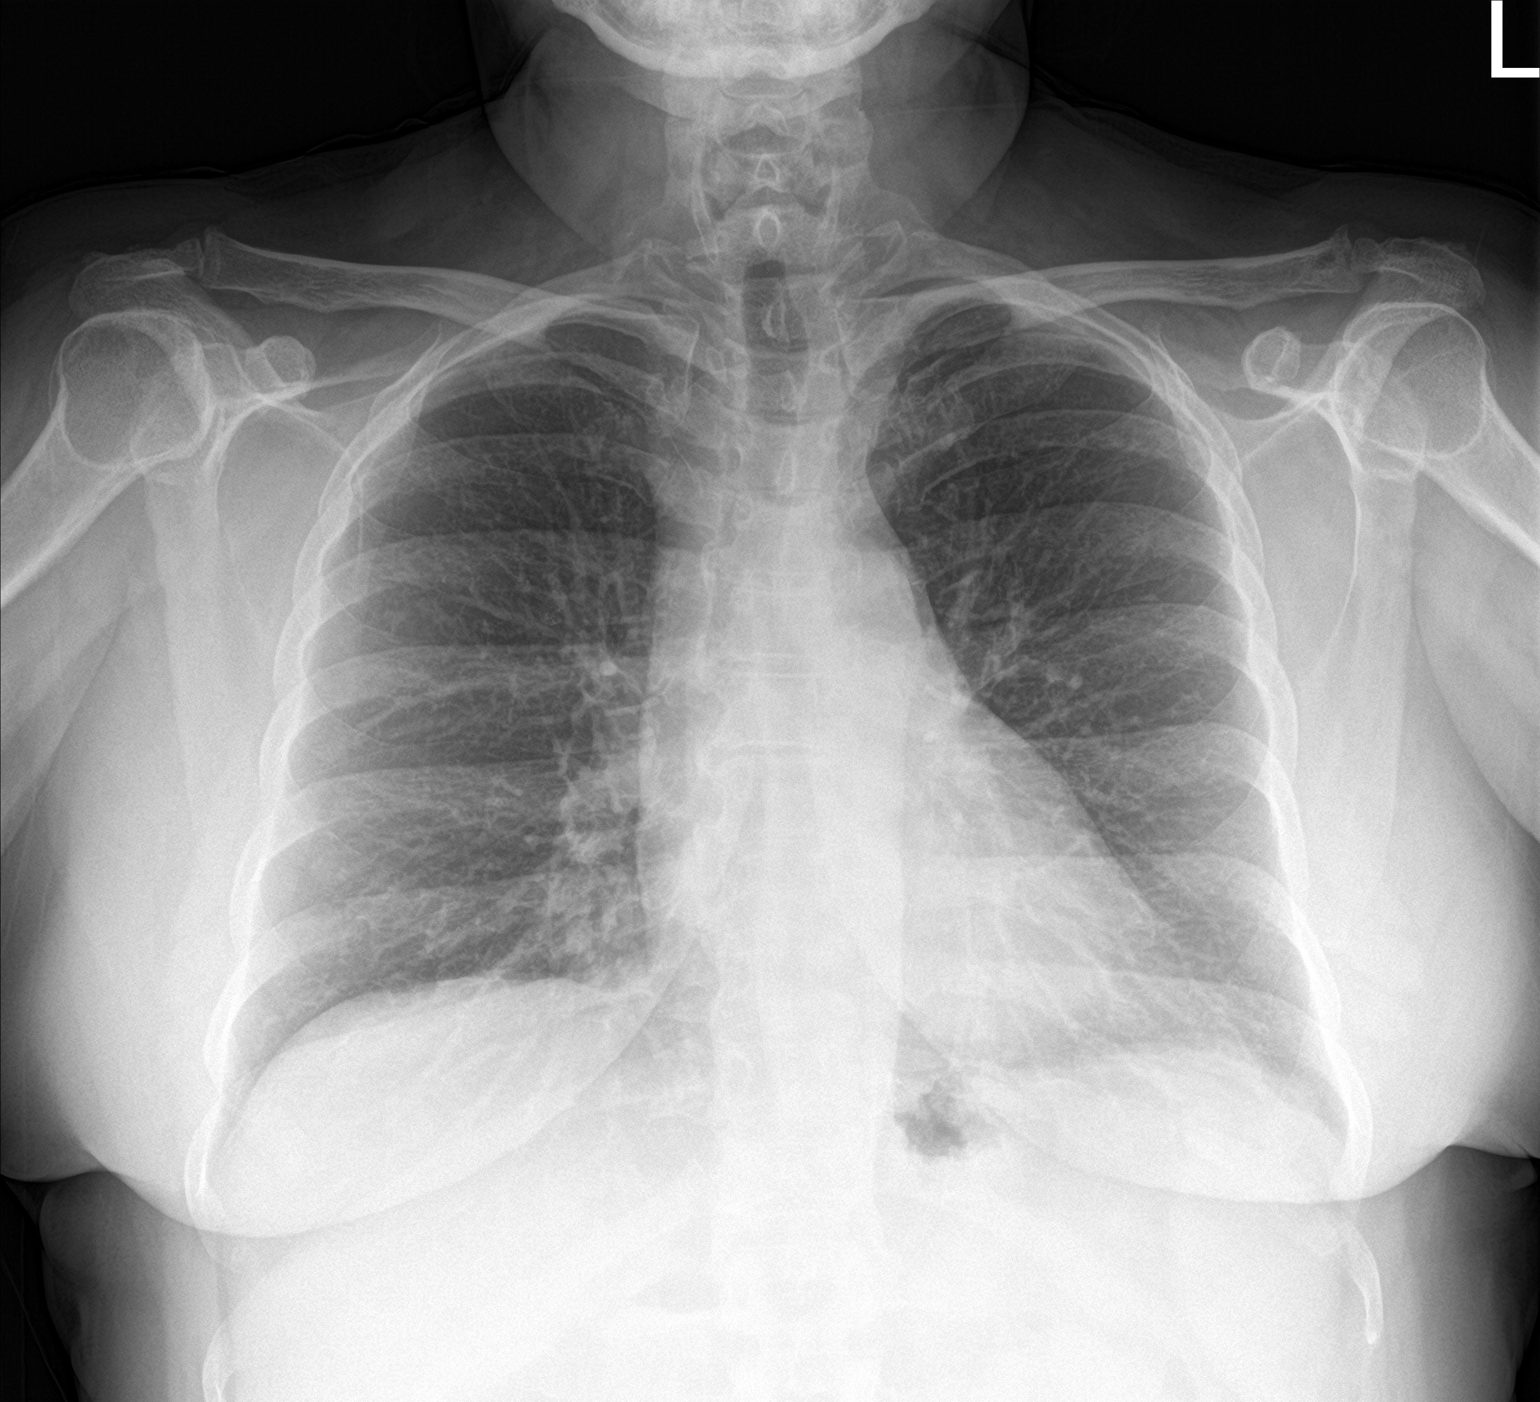

[chest lat]
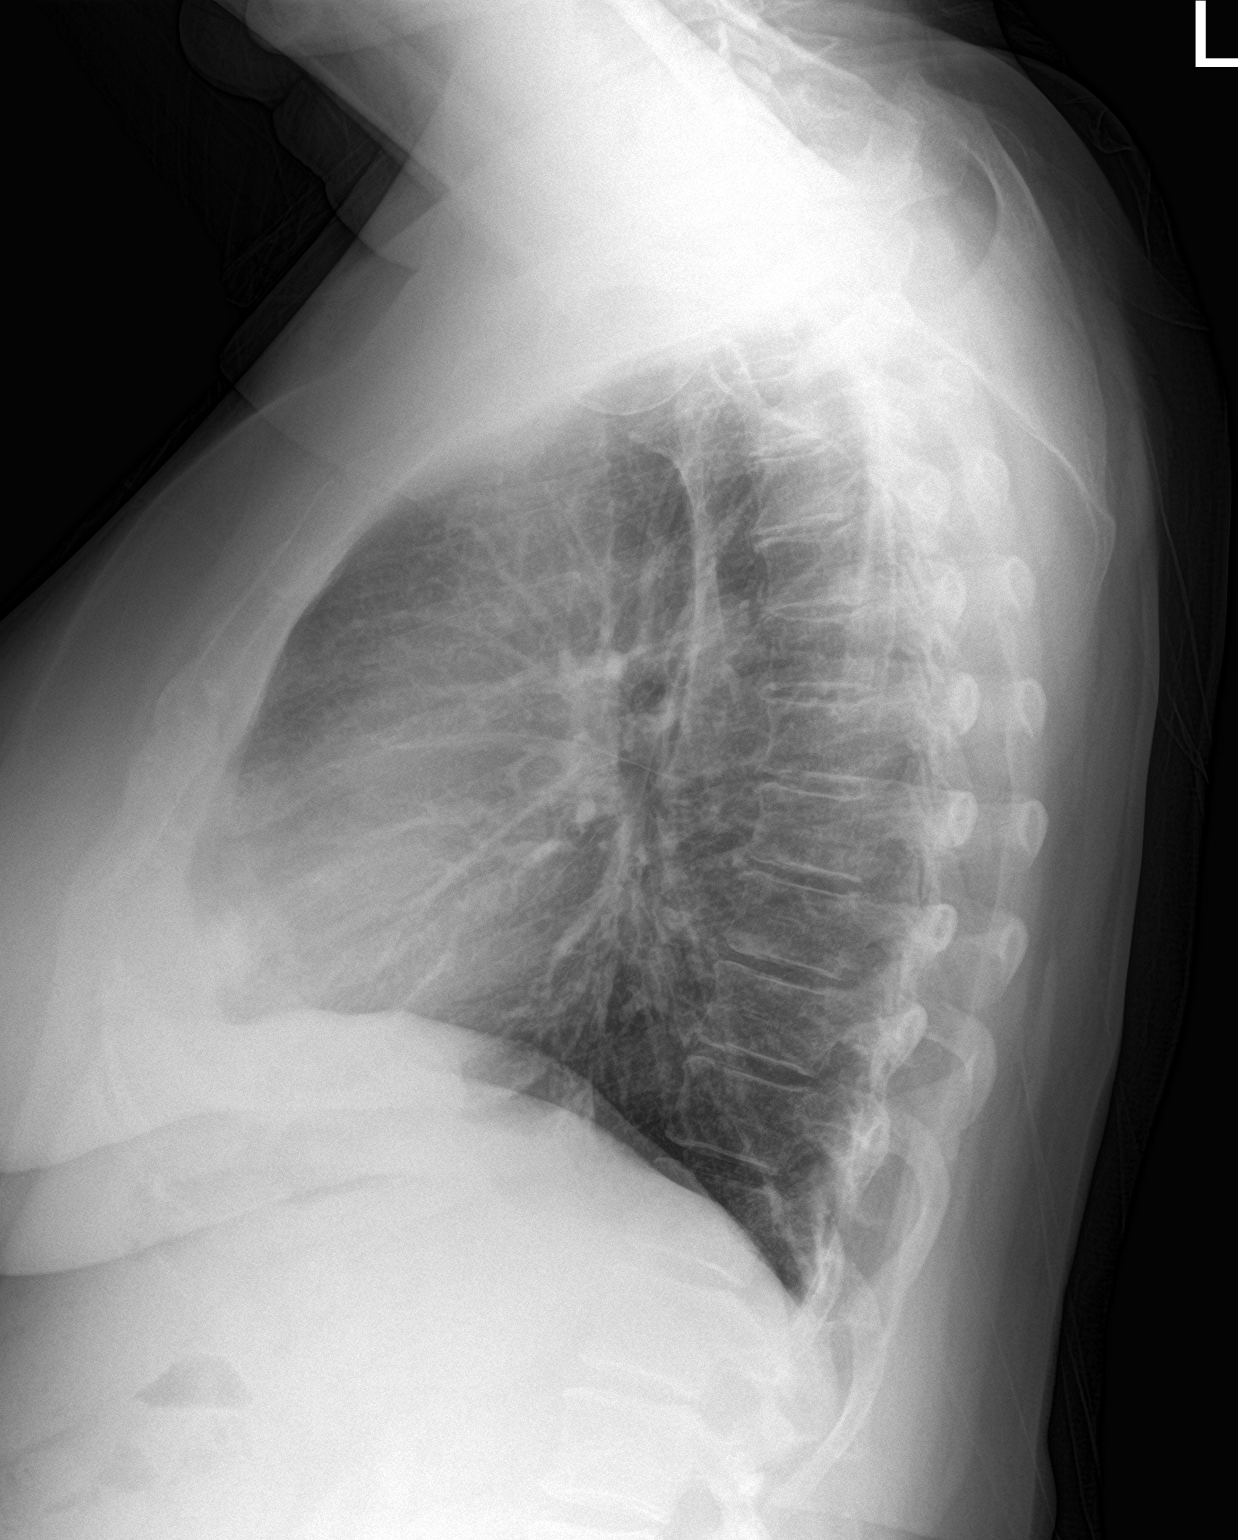

[2 of 2 positions shown; findings below may reference images not displayed]

FINDINGS: The heart size and mediastinal contours are within normal limits.
Both lungs are clear. The visualized skeletal structures are
unremarkable.
IMPRESSION: No active cardiopulmonary disease.

## 2024-04-13 IMAGING — CT CT ABD-PELV W/ CM
2 of 5 series · 16 of 46 positions shown, 18 images · IV contrast (APPLIED)
Comparison: 05/16/2021

CLINICAL DATA: Blunt poly trauma. Technologist notes tick bite with
gastrointestinal issues.

EXAM:
CT ABDOMEN AND PELVIS WITH CONTRAST
TECHNIQUE: Multidetector CT imaging of the abdomen and pelvis was performed
using the standard protocol following bolus administration of
intravenous contrast.

[Series 3: abd/ pelvis 5.0 i30f 2 · axial · 0.92mm/px · z∈[+717,+1082]mm · 13 of 83 slices shown, 15 images]
[im 5/83  soft-tissue]
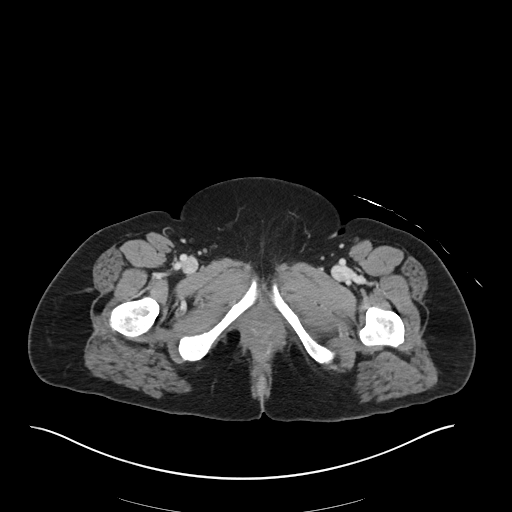
[im 5/83  bone]
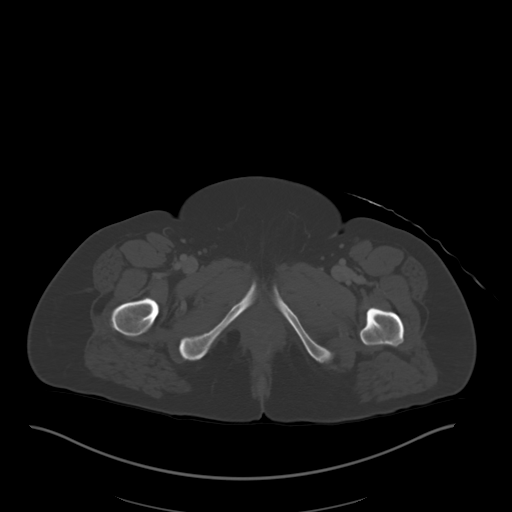
[im 13/83  soft-tissue]
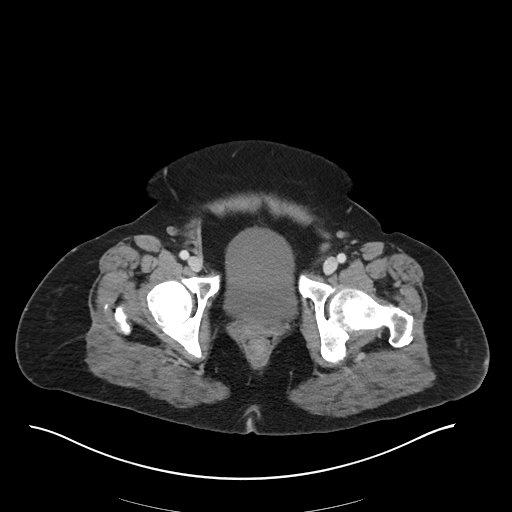
[im 17/83  soft-tissue]
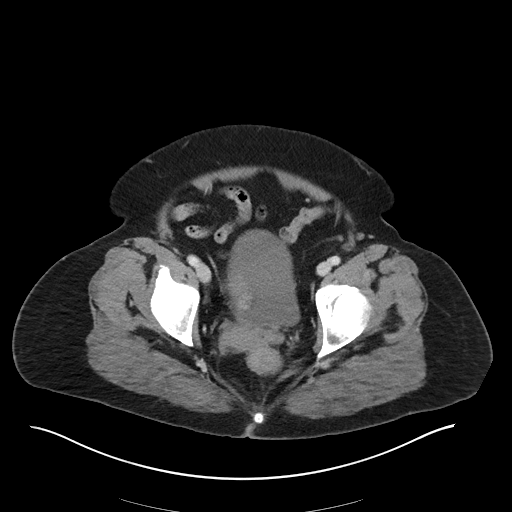
[im 25/83  soft-tissue]
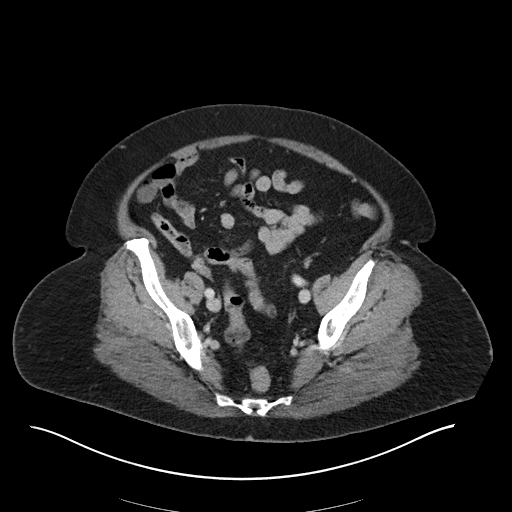
[im 29/83  soft-tissue]
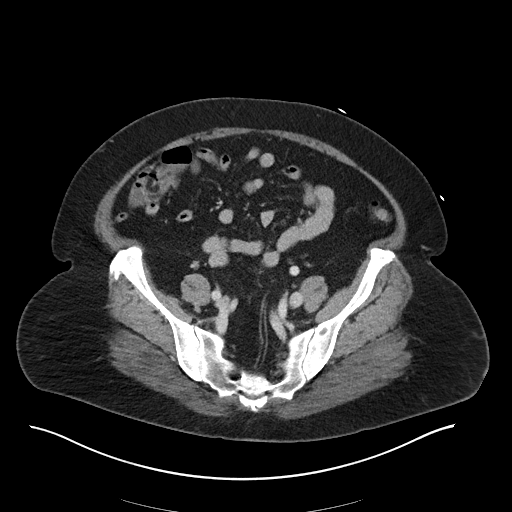
[im 37/83  soft-tissue]
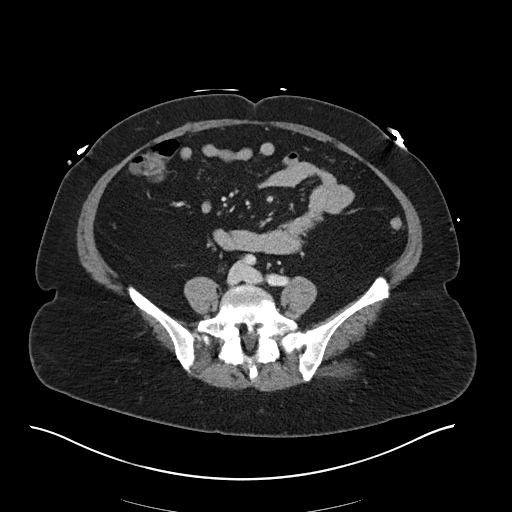
[im 42/83  soft-tissue]
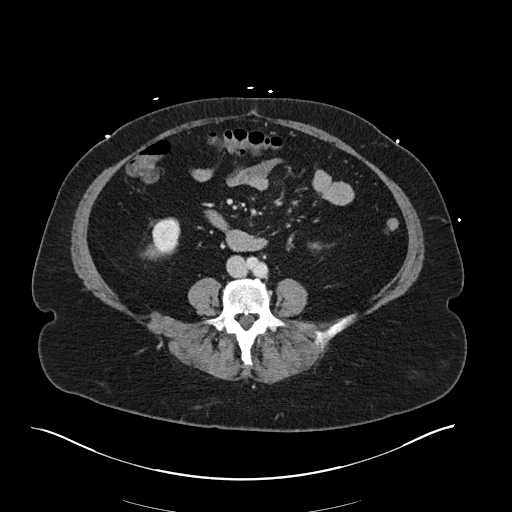
[im 46/83  soft-tissue]
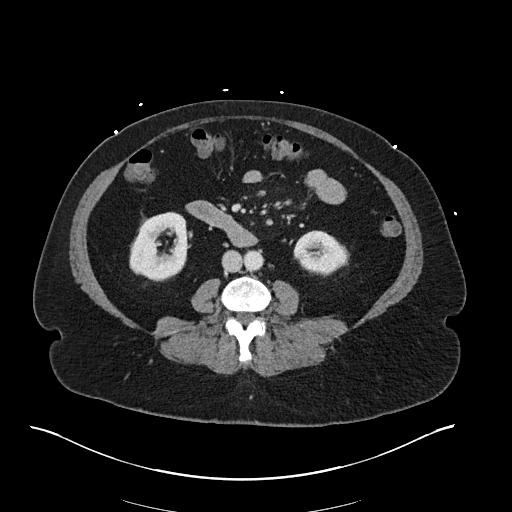
[im 54/83  soft-tissue]
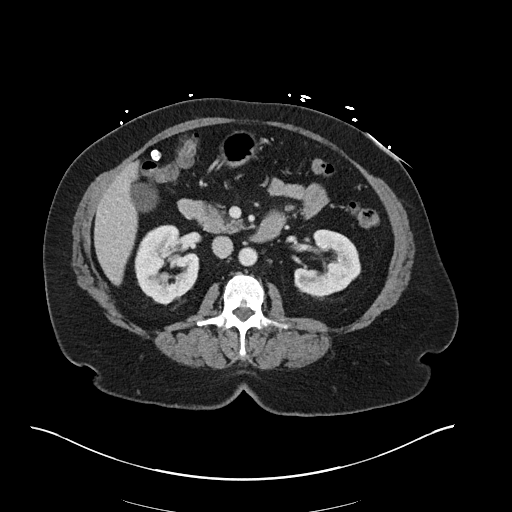
[im 54/83  bone]
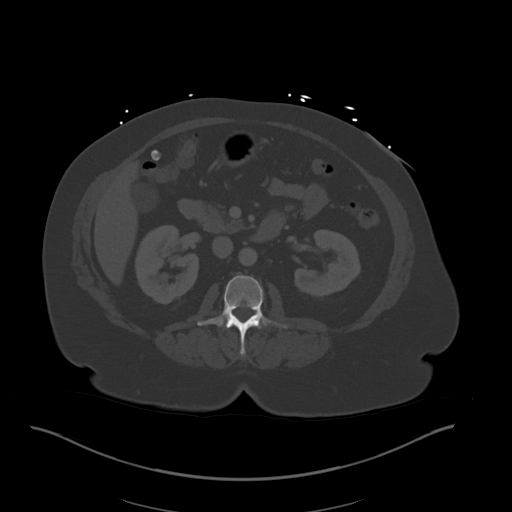
[im 58/83  soft-tissue]
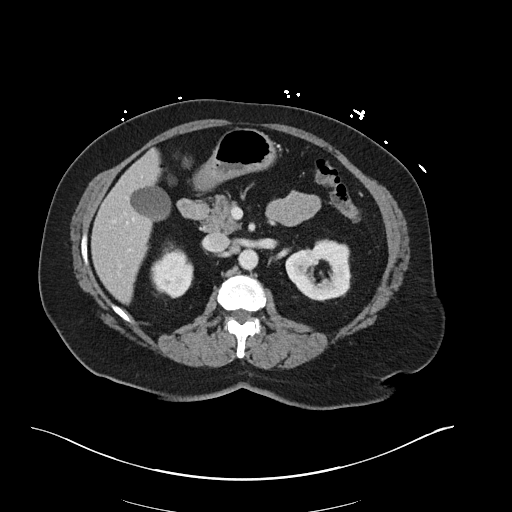
[im 66/83  soft-tissue]
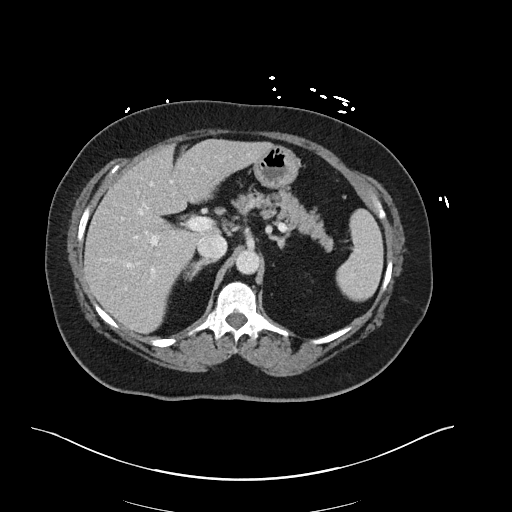
[im 70/83  soft-tissue]
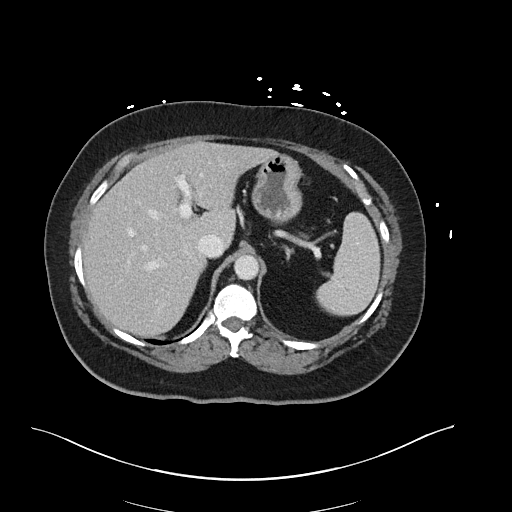
[im 78/83  soft-tissue]
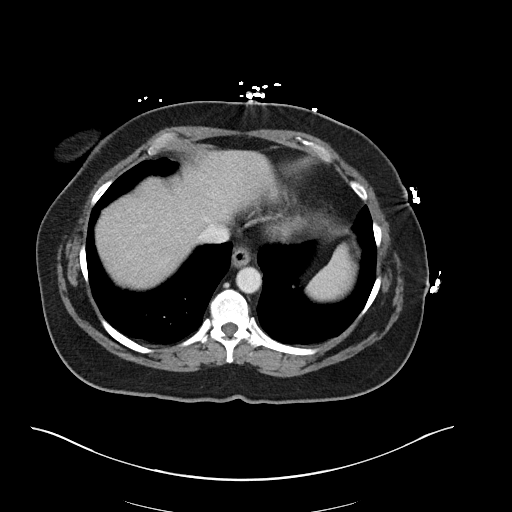

[Series 6: coronal soft tissue · coronal · 0.81mm/px · 3 of 105 slices shown]
[im 35/105  soft-tissue]
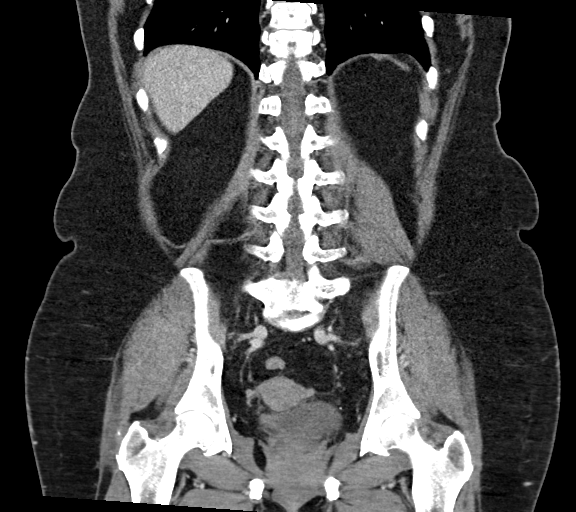
[im 47/105  soft-tissue]
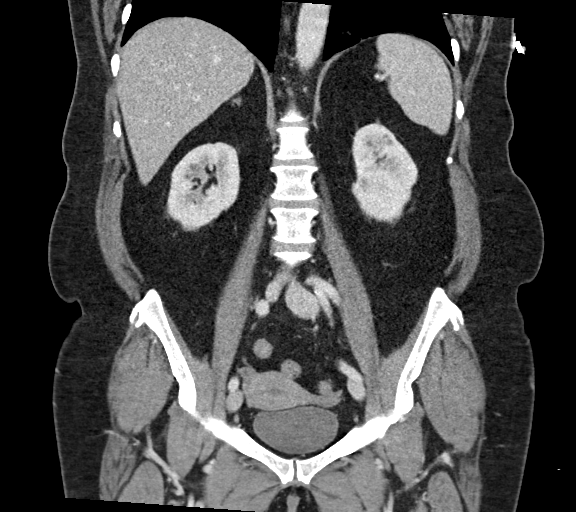
[im 58/105  soft-tissue]
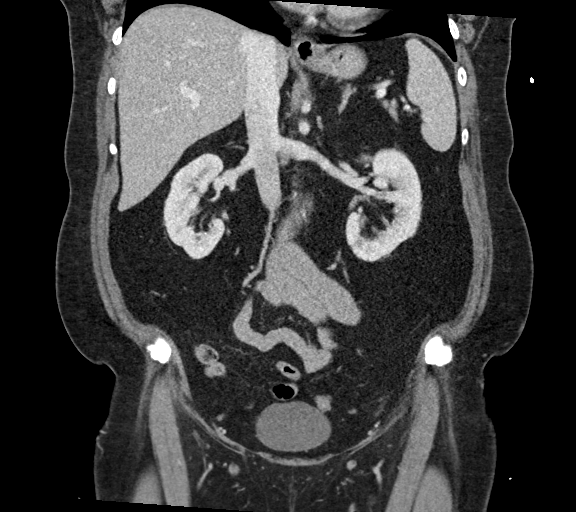

[16 of 46 positions shown; findings below may reference images not displayed]

RADIATION DOSE REDUCTION: This exam was performed according to the
departmental dose-optimization program which includes automated
exposure control, adjustment of the mA and/or kV according to
patient size and/or use of iterative reconstruction technique.

CONTRAST:  100mL OMNIPAQUE IOHEXOL 300 MG/ML  SOLN
FINDINGS: Lower chest: Small volume pulmonary opacity in the medial left lower
lobe.

Hepatobiliary: Subcentimeter low-density in the lateral left lobe
liver is stable and too small for accurate densitometry.No evidence
of biliary obstruction or stone.

Pancreas: Unremarkable.

Spleen: Unremarkable.

Adrenals/Urinary Tract: Negative adrenals. Numerous small left
nonobstructing renal calculi, see recent noncontrast abdominal CT.
Resolved right hydronephrosis on prior due to resolved ureteral
stone. Unremarkable bladder.

Stomach/Bowel: No obstruction. No appendicitis or other visible
inflammation.

Vascular/Lymphatic: No acute vascular abnormality. No mass or
adenopathy.

Reproductive:No pathologic findings.

Other: No ascites or pneumoperitoneum.

Musculoskeletal: No acute abnormalities. Chronic L5 pars defects
with L5-S1 anterolisthesis and accentuated disc space narrowing.
IMPRESSION: 1. No acute intra-abdominal finding.
2. Atelectasis or less likely infiltrate at left lower lobe.
3. Multiple small nonobstructing left renal calculi.

## 2024-06-20 ENCOUNTER — Encounter: Admitting: Primary Care
